# Patient Record
Sex: Female | Born: 1955 | Race: White | Hispanic: No | Smoking: Former smoker
Health system: Southern US, Community
[De-identification: ages and names within clinical notes are randomized; demographics above are authoritative.]

## PROBLEM LIST (undated history)

## (undated) DIAGNOSIS — F1010 Alcohol abuse, uncomplicated: Secondary | ICD-10-CM

## (undated) DIAGNOSIS — I10 Essential (primary) hypertension: Secondary | ICD-10-CM

## (undated) DIAGNOSIS — J189 Pneumonia, unspecified organism: Secondary | ICD-10-CM

## (undated) DIAGNOSIS — F32A Depression, unspecified: Secondary | ICD-10-CM

## (undated) DIAGNOSIS — Z720 Tobacco use: Secondary | ICD-10-CM

## (undated) DIAGNOSIS — F419 Anxiety disorder, unspecified: Secondary | ICD-10-CM

## (undated) DIAGNOSIS — K219 Gastro-esophageal reflux disease without esophagitis: Secondary | ICD-10-CM

## (undated) DIAGNOSIS — I5022 Chronic systolic (congestive) heart failure: Secondary | ICD-10-CM

## (undated) DIAGNOSIS — J449 Chronic obstructive pulmonary disease, unspecified: Secondary | ICD-10-CM

## (undated) DIAGNOSIS — R51 Headache: Secondary | ICD-10-CM

## (undated) DIAGNOSIS — M1990 Unspecified osteoarthritis, unspecified site: Secondary | ICD-10-CM

## (undated) DIAGNOSIS — F329 Major depressive disorder, single episode, unspecified: Secondary | ICD-10-CM

## (undated) DIAGNOSIS — C260 Malignant neoplasm of intestinal tract, part unspecified: Secondary | ICD-10-CM

## (undated) DIAGNOSIS — R519 Headache, unspecified: Secondary | ICD-10-CM

## (undated) HISTORY — DX: Essential (primary) hypertension: I10

## (undated) HISTORY — PX: TUBAL LIGATION: SHX77

## (undated) HISTORY — PX: CHOLECYSTECTOMY: SHX55

## (undated) HISTORY — PX: JOINT REPLACEMENT: SHX530

## (undated) HISTORY — PX: KNEE ARTHROSCOPY: SUR90

## (undated) HISTORY — PX: UPPER GI ENDOSCOPY: SHX6162

## (undated) HISTORY — PX: BACK SURGERY: SHX140

## (undated) HISTORY — PX: DILATION AND CURETTAGE OF UTERUS: SHX78

## (undated) HISTORY — PX: OTHER SURGICAL HISTORY: SHX169

## (undated) HISTORY — PX: LAPAROSCOPY: SHX197

## (undated) HISTORY — PX: ELBOW SURGERY: SHX618

## (undated) HISTORY — PX: CARPAL TUNNEL RELEASE: SHX101

## (undated) HISTORY — PX: ULNAR NERVE TRANSPOSITION: SHX2595

---

## 1997-09-24 ENCOUNTER — Emergency Department (HOSPITAL_COMMUNITY): Admission: EM | Admit: 1997-09-24 | Discharge: 1997-09-24 | Payer: Self-pay | Admitting: Emergency Medicine

## 2003-01-16 ENCOUNTER — Encounter: Admission: RE | Admit: 2003-01-16 | Discharge: 2003-01-16 | Payer: Self-pay | Admitting: Family Medicine

## 2003-01-16 ENCOUNTER — Encounter: Payer: Self-pay | Admitting: Family Medicine

## 2003-10-10 ENCOUNTER — Encounter: Admission: RE | Admit: 2003-10-10 | Discharge: 2003-10-10 | Payer: Self-pay | Admitting: Orthopaedic Surgery

## 2005-08-26 ENCOUNTER — Ambulatory Visit: Payer: Self-pay | Admitting: Family Medicine

## 2005-09-25 ENCOUNTER — Ambulatory Visit: Payer: Self-pay | Admitting: Family Medicine

## 2005-09-29 ENCOUNTER — Ambulatory Visit: Payer: Self-pay | Admitting: Gastroenterology

## 2005-10-02 ENCOUNTER — Ambulatory Visit: Payer: Self-pay | Admitting: Gastroenterology

## 2008-12-03 ENCOUNTER — Emergency Department (HOSPITAL_COMMUNITY): Admission: EM | Admit: 2008-12-03 | Discharge: 2008-12-03 | Payer: Self-pay | Admitting: Emergency Medicine

## 2009-05-14 ENCOUNTER — Emergency Department (HOSPITAL_COMMUNITY): Admission: EM | Admit: 2009-05-14 | Discharge: 2009-05-14 | Payer: Self-pay | Admitting: Emergency Medicine

## 2009-05-31 ENCOUNTER — Emergency Department (HOSPITAL_COMMUNITY): Admission: EM | Admit: 2009-05-31 | Discharge: 2009-06-01 | Payer: Self-pay | Admitting: Emergency Medicine

## 2009-07-30 ENCOUNTER — Ambulatory Visit: Payer: Self-pay | Admitting: Internal Medicine

## 2009-08-29 ENCOUNTER — Ambulatory Visit: Payer: Self-pay | Admitting: Internal Medicine

## 2009-08-29 ENCOUNTER — Encounter (INDEPENDENT_AMBULATORY_CARE_PROVIDER_SITE_OTHER): Payer: Self-pay | Admitting: Family Medicine

## 2009-08-29 LAB — CONVERTED CEMR LAB
AST: 19 units/L (ref 0–37)
Albumin: 4 g/dL (ref 3.5–5.2)
Alkaline Phosphatase: 117 units/L (ref 39–117)
Basophils Absolute: 0.1 10*3/uL (ref 0.0–0.1)
CRP: 3 mg/dL — ABNORMAL HIGH (ref <0.6)
Calcium: 9.2 mg/dL (ref 8.4–10.5)
Eosinophils Absolute: 0.5 10*3/uL (ref 0.0–0.7)
Eosinophils Relative: 4 % (ref 0–5)
Glucose, Bld: 78 mg/dL (ref 70–99)
Lymphocytes Relative: 32 % (ref 12–46)
Lymphs Abs: 3.6 10*3/uL (ref 0.7–4.0)
MCV: 91.6 fL (ref 78.0–100.0)
Monocytes Absolute: 0.8 10*3/uL (ref 0.1–1.0)
Monocytes Relative: 7 % (ref 3–12)
Neutrophils Relative %: 55 % (ref 43–77)
Rhuematoid fact SerPl-aCnc: <20 intl units/mL (ref 0–20)
Sodium: 142 meq/L (ref 135–145)
TSH: 1.404 microintl units/mL (ref 0.350–4.500)
Total Bilirubin: 0.4 mg/dL (ref 0.3–1.2)
Total Protein: 7.3 g/dL (ref 6.0–8.3)

## 2009-09-21 ENCOUNTER — Encounter
Admission: RE | Admit: 2009-09-21 | Discharge: 2009-12-20 | Payer: Self-pay | Admitting: Physical Medicine & Rehabilitation

## 2009-09-27 ENCOUNTER — Ambulatory Visit: Payer: Self-pay | Admitting: Physical Medicine & Rehabilitation

## 2009-10-01 ENCOUNTER — Ambulatory Visit: Payer: Self-pay | Admitting: Internal Medicine

## 2009-10-03 ENCOUNTER — Encounter
Admission: RE | Admit: 2009-10-03 | Discharge: 2009-11-02 | Payer: Self-pay | Admitting: Physical Medicine & Rehabilitation

## 2009-10-10 ENCOUNTER — Ambulatory Visit: Payer: Self-pay | Admitting: Internal Medicine

## 2009-10-18 ENCOUNTER — Ambulatory Visit: Payer: Self-pay | Admitting: Internal Medicine

## 2009-10-25 ENCOUNTER — Encounter
Admission: RE | Admit: 2009-10-25 | Discharge: 2009-10-25 | Payer: Self-pay | Admitting: Physical Medicine & Rehabilitation

## 2009-11-02 ENCOUNTER — Ambulatory Visit: Payer: Self-pay | Admitting: Physical Medicine & Rehabilitation

## 2009-11-26 ENCOUNTER — Ambulatory Visit: Payer: Self-pay | Admitting: Internal Medicine

## 2010-06-28 ENCOUNTER — Emergency Department (HOSPITAL_COMMUNITY): Payer: Managed Care, Other (non HMO)

## 2010-06-28 ENCOUNTER — Inpatient Hospital Stay (HOSPITAL_COMMUNITY)
Admission: EM | Admit: 2010-06-28 | Discharge: 2010-07-02 | DRG: 192 | Disposition: A | Discharge status: Home / Self Care | Payer: Managed Care, Other (non HMO) | Attending: Internal Medicine | Admitting: Internal Medicine

## 2010-06-28 DIAGNOSIS — J209 Acute bronchitis, unspecified: Principal | ICD-10-CM | POA: Diagnosis present

## 2010-06-28 DIAGNOSIS — J440 Chronic obstructive pulmonary disease with acute lower respiratory infection: Principal | ICD-10-CM | POA: Diagnosis present

## 2010-06-28 DIAGNOSIS — I454 Nonspecific intraventricular block: Secondary | ICD-10-CM | POA: Diagnosis present

## 2010-06-28 DIAGNOSIS — F1010 Alcohol abuse, uncomplicated: Secondary | ICD-10-CM | POA: Diagnosis present

## 2010-06-28 DIAGNOSIS — I447 Left bundle-branch block, unspecified: Secondary | ICD-10-CM

## 2010-06-28 DIAGNOSIS — F17200 Nicotine dependence, unspecified, uncomplicated: Secondary | ICD-10-CM | POA: Diagnosis present

## 2010-06-28 DIAGNOSIS — R000 Tachycardia, unspecified: Secondary | ICD-10-CM | POA: Diagnosis present

## 2010-06-28 DIAGNOSIS — F411 Generalized anxiety disorder: Secondary | ICD-10-CM | POA: Diagnosis present

## 2010-06-28 HISTORY — DX: Left bundle-branch block, unspecified: I44.7

## 2010-06-28 LAB — CBC
MCV: 93.7 fL (ref 78.0–100.0)
Platelets: 332 10*3/uL (ref 150–400)
RDW: 14.1 % (ref 11.5–15.5)
WBC: 11.8 10*3/uL — ABNORMAL HIGH (ref 4.0–10.5)

## 2010-06-28 LAB — BASIC METABOLIC PANEL
CO2: 24 mEq/L (ref 19–32)
GFR calc non Af Amer: >60 mL/min (ref >60)
Glucose, Bld: 134 mg/dL — ABNORMAL HIGH (ref 70–99)
Sodium: 142 mEq/L (ref 135–145)

## 2010-06-28 LAB — BLOOD GAS, ARTERIAL
Bicarbonate: 23 mEq/L (ref 20.0–24.0)
pH, Arterial: 7.322 — ABNORMAL LOW (ref 7.350–7.400)

## 2010-06-28 LAB — DIFFERENTIAL
Basophils Absolute: 0.1 10*3/uL (ref 0.0–0.1)
Lymphs Abs: 1.8 10*3/uL (ref 0.7–4.0)
Monocytes Absolute: 0.5 10*3/uL (ref 0.1–1.0)
Neutro Abs: 8.8 10*3/uL — ABNORMAL HIGH (ref 1.7–7.7)
Neutrophils Relative %: 75 % (ref 43–77)

## 2010-06-28 LAB — POCT CARDIAC MARKERS: Troponin i, poc: <0.05 ng/mL (ref 0.00–0.09)

## 2010-06-29 LAB — COMPREHENSIVE METABOLIC PANEL
Albumin: 3.5 g/dL (ref 3.5–5.2)
Alkaline Phosphatase: 114 U/L (ref 39–117)
BUN: 7 mg/dL (ref 6–23)
CO2: 26 mEq/L (ref 19–32)
Calcium: 9.4 mg/dL (ref 8.4–10.5)
GFR calc Af Amer: >60 mL/min (ref >60)
Total Bilirubin: 0.3 mg/dL (ref 0.3–1.2)
Total Protein: 7.8 g/dL (ref 6.0–8.3)

## 2010-06-29 LAB — RAPID URINE DRUG SCREEN, HOSP PERFORMED
Cocaine: NOT DETECTED
Opiates: POSITIVE — AB
Tetrahydrocannabinol: NOT DETECTED

## 2010-06-29 LAB — CARDIAC PANEL(CRET KIN+CKTOT+MB+TROPI)
CK, MB: 6.4 ng/mL (ref 0.3–4.0)
CK, MB: 8 ng/mL (ref 0.3–4.0)
CK, MB: 9.9 ng/mL (ref 0.3–4.0)
Relative Index: 2.5 (ref 0.0–2.5)
Relative Index: 2.6 — ABNORMAL HIGH (ref 0.0–2.5)
Relative Index: 2.6 — ABNORMAL HIGH (ref 0.0–2.5)
Total CK: 310 U/L — ABNORMAL HIGH (ref 7–177)

## 2010-06-29 LAB — CBC
HCT: 41.1 % (ref 36.0–46.0)
MCHC: 32.6 g/dL (ref 30.0–36.0)
MCV: 93.8 fL (ref 78.0–100.0)
WBC: 10.1 10*3/uL (ref 4.0–10.5)

## 2010-06-29 LAB — PHOSPHORUS: Phosphorus: 2.4 mg/dL (ref 2.3–4.6)

## 2010-06-29 LAB — MAGNESIUM: Magnesium: 2.4 mg/dL (ref 1.5–2.5)

## 2010-06-29 LAB — TSH: TSH: 0.388 u[IU]/mL (ref 0.350–4.500)

## 2010-07-01 LAB — CBC
HCT: 37.8 % (ref 36.0–46.0)
MCH: 30 pg (ref 26.0–34.0)
Platelets: 255 10*3/uL (ref 150–400)
RBC: 3.9 MIL/uL (ref 3.87–5.11)

## 2010-07-01 LAB — BASIC METABOLIC PANEL
CO2: 27 mEq/L (ref 19–32)
Chloride: 111 mEq/L (ref 96–112)
Creatinine, Ser: 0.49 mg/dL (ref 0.4–1.2)
GFR calc Af Amer: >60 mL/min (ref >60)
Potassium: 4.2 mEq/L (ref 3.5–5.1)
Sodium: 142 mEq/L (ref 135–145)

## 2010-07-01 LAB — MAGNESIUM: Magnesium: 2.2 mg/dL (ref 1.5–2.5)

## 2010-07-02 LAB — BASIC METABOLIC PANEL
BUN: 9 mg/dL (ref 6–23)
Calcium: 8.6 mg/dL (ref 8.4–10.5)
GFR calc non Af Amer: >60 mL/min (ref >60)
Potassium: 4.4 mEq/L (ref 3.5–5.1)

## 2010-07-02 LAB — CBC
MCHC: 31 g/dL (ref 30.0–36.0)
MCV: 97.2 fL (ref 78.0–100.0)
Platelets: 238 10*3/uL (ref 150–400)
RDW: 14.5 % (ref 11.5–15.5)
WBC: 15.1 10*3/uL — ABNORMAL HIGH (ref 4.0–10.5)

## 2010-07-03 NOTE — Discharge Summary (Signed)
NAME:  Katrina [name], MALLY NO.:  [id number]  MEDICAL RECORD NO.:  [id number]           PATIENT TYPE:  I  LOCATION:  1407                         FACILITY:  [facility]  PHYSICIAN:  Isidor Holts, M.D.  DATE OF BIRTH:  06-Mar-1956  DATE OF ADMISSION:  06/28/2010 DATE OF DISCHARGE:                              DISCHARGE SUMMARY   DATE OF ADMISSION:  June 28, 2010  DATE OF DISCHARGE:  July 02, 2010  PRIMARY MD:  Noberto Retort, M.D.  DISCHARGE DIAGNOSES: 1. Acute bronchitis. 2. Infective exacerbation of chronic obstructive pulmonary disease,     secondary to acute bronchitis. 3. Smoking history. 4. Alcohol abuse. 5. Anxiety/depression. 6. Chronic left bundle-branch block. 7. Morbid obesity.  DISCHARGE MEDICATIONS: 1. Symbicort (80/4.5 mcg) 1 puff b.i.d. 2. Mucinex 600 mg p.o. b.i.d. for 7 days. 3. Avelox 400 mg p.o. daily at bedtime for 7 days. 4. NicoDerm CQ (21 mg/24-hour patch) one patch to skin daily.5. MiraLax 17 g p.o. daily for constipation. 6. Prednisone 40 mg p.o. daily for 3 days, then 30 mg p.o. day for 3     days, then 20 mg p.o. daily for 3 days, then 10 mg p.o. daily for 3     days, then stop. 7. Thiamine 100 mg p.o. daily. 8. Xopenex HFA inhaler.  2 puffs p.r.n. q.4 hourly for shortness of     breath. 9. Women's 50 Plus multivitamins one p.o. daily.  PROCEDURES:  Chest x-ray June 28, 2010.  This showed cardiac mediastinal silhouette unremarkable.  Central mild bronchitic changes. No acute infiltrate or edema.  Bony thorax is unremarkable.  CONSULTATIONS:  None.  ADMISSION HISTORY:  As in H and P notes of June 28, 2010 dictated by Dr. Lonia Blood.  However, in brief, this is a 55 year old female smoker, with history of alcohol abuse, anxiety, degenerative disk disease, depression, chronic left bundle branch block, presenting to her primary MD's office with progressive shortness of breath associated with a dry cough of approximately one  week's duration.  She was found to be tachycardic and wheezy and after a number of treatments at the primary MD's office, because of failure of amelioration of symptoms, she was sent over to the ED and was subsequently admitted for further evaluation, investigation and management.  CLINICAL COURSE: 1. Acute bronchitis.  The patient presents as described above.  Chest     x-ray demonstrated bronchitic changes with no evidence of pneumonic     consolidation.  She was managed with Avelox, Mucinex,     bronchodilator nebulizers.  Clinical response was satisfactory.  By     July 02, 2010, cough had considerably ameliorated and the patient     felt considerably better.  2. COPD exacerbation.  This was secondary to acute bronchitis,     responded to above-mentioned management measures as well as     parenteral steroids and nebulizers.  However, by June 30, 2010,     clinical condition improved so much so, that we were able to     transition the patient to oral steroid taper.  By July 02, 2010,  she had no audible wheezes, felt considerably better, and was     considered stable for discharge.  3. Smoking history.  The patient was managed with NicoDerm CQ patch     and counseled appropriately.  She has requested a prescription for     NicoDerm CQ patch and appears willing to try to quit.  She has been     encouraged accordingly.  4. Alcohol abuse.  The patient was counseled with regards to alcohol     excess and was managed with p.r.n. Ativan during the course of this     hospitalization, as well as vitamin supplements.  She showed no     clinical evidence of alcohol withdrawal during this     hospitalization.  5. History of anxiety/depression.  Mood remained stable during this     hospitalization.  DISPOSITION:  The patient was on July 02, 2010, considered clinically stable for discharge with no new issues.  She was therefore discharged accordingly.  ACTIVITY:  As  tolerated.  DIET:  No restrictions.  FOLLOWUP INSTRUCTIONS:  The patient is to follow up routinely with her primary MD per prior scheduled appointment.     Isidor Holts, M.D.     CO/MEDQ  D:  07/02/2010  T:  07/03/2010  Job:  [id number]  cc:   Melida Quitter, M.D. Fax: [phone]  Electronically Signed by Isidor Holts M.D. on 07/03/2010 02:11:30 PM

## 2010-07-23 NOTE — H&P (Signed)
NAME:  Katrina [name], NORDLUND NO.:  [id number]  MEDICAL RECORD NO.:  [id number]           PATIENT TYPE:  I  LOCATION:  1407                         [facility]ACILITY:  [facility]  PHYSICIAN:  Lonia Blood, M.D.      DATE OF BIRTH:  09-24-1955  DATE OF ADMISSION:  06/28/2010 DATE OF DISCHARGE:                             HISTORY & PHYSICAL   PRIMARY CARE PHYSICIAN:  Noberto Retort, M.D.  PRESENTING COMPLAINT:  Shortness of breath.  HISTORY OF PRESENT ILLNESS:  The patient is a 55 year old female who is a heavy smoker and also drinks presenting with progressive shortness of breath for the last 1 week.  Associated is dry cough.  She went to see her primary care physician today, was tachycardic.  She was having some mild chest pain.  She was wheezing also.  She apparently had 4 treatments at home and 1 at the PCP's office, but continued to have wheezing and shortness of breath.  She was sent over for further workup in the ED.  On arrival, she had a heart rate of 131 with sinus tachycardia with left bundle-branch block.  PAST MEDICAL HISTORY:  Significant for: 1. Anxiety. 2. Degenerative disk disease. 3. Depression. 4. Prior left bundle-branch block on EKG. 5. Heavy alcohol abuse. 6. Tobacco abuse.  ALLERGIES:  CODEINE.  MEDICATIONS:  Albuterol nebs, tramadol and acetaminophen, and Cymbalta.  SOCIAL HISTORY:  She lives in [place].  She smokes about 3 packs of tobacco per day and drinks about 3- to 6-pack beers every night.  Denied IV drug use.  FAMILY HISTORY:  Denied any family history of substance abuse, mainly hypertension.  REVIEW OF SYSTEMS:  Generalized aches otherwise rest of the review of systems is per HPI.  PHYSICAL EXAMINATION:  VITAL SIGNS:  Temperature is 98.3, blood pressure 169/117, pulse 137, respiratory rate 32, sats 98% room air. GENERAL:  The patient looks anxious, worried, slightly tachypneic but in mild respiratory distress. HEENT:  PERRL.   EOMI.  No pallor.  No jaundice.  No rhinorrhea. NECK:  Supple.  No JVD.  No lymphadenopathy. RESPIRATORY:  She has decreased air entry bilaterally, not moving air adequately, with some mild external wheezing.  No crackles.  No rales. CARDIOVASCULAR SYSTEM:  She is tachycardic. ABDOMEN:  Obese, soft, and nontender with positive bowel sounds. EXTREMITIES:  No edema, cyanosis, or clubbing. SKIN:  No rashes.  No ulcers. MUSCULOSKELETAL:  No joint swelling, tenderness. PSYCH:  She seems anxious, worried, otherwise no suicidal ideation.  LABORATORY DATA:  Sodium 142, potassium 3.5, chloride 106, CO2 24, glucose 134, BUN 7, creatinine 0.71, and calcium 9.5.  White count is 11.8, hemoglobin 14.0, platelet count 332.  She has left shift ANC of 8.8.  Her chest x-ray showed no acute infiltrate or edema.  There is mild bronchitic changes.  ABG on 100% oxygen non-rebreather mask showed pH of 7.322, pCO2 of 45.7, and pO2 of 222.  ASSESSMENT:  This is a 55 year old female presenting with what appears to be chronic obstructive pulmonary disease versus asthma exacerbation.  PLAN: 1. Chronic obstructive pulmonary disease.  Admit the patient, keep on  oxygen titrated to nasal cannula.  IV Solu-Medrol and nebulizers in     the hospital.  If she remains tachycardic, I will use Xopenex     rather than albuterol.  I will also give her IV antibiotics with a     heavy tobacco smoking and if she had asthma.  This is more than     likely chronic obstructive pulmonary disease, superimposed. 2. Tobacco abuse.  I will put the patient on nicotine patch and also     tobacco cessation counseling. 3. Alcohol abuse with active CIWA protocol. 4. Morbid obesity.  The patient has been counseled. 5. Depression.  I will continue with her Cymbalta once I know the     exact dose. 6. Anxiety disorder.  Again, we will get her home medication list and     continue with that.     Lonia Blood, M.D.     Verlin Grills  D:   06/29/2010  T:  06/29/2010  Job:  [id number]  Electronically Signed by Lonia Blood M.D. on 07/23/2010 03:47:27 PM

## 2010-10-31 ENCOUNTER — Inpatient Hospital Stay (INDEPENDENT_AMBULATORY_CARE_PROVIDER_SITE_OTHER)
Admission: RE | Admit: 2010-10-31 | Discharge: 2010-10-31 | Disposition: A | Discharge status: Home / Self Care | Payer: Managed Care, Other (non HMO) | Source: Ambulatory Visit | Attending: Family Medicine | Admitting: Family Medicine

## 2010-10-31 ENCOUNTER — Ambulatory Visit (INDEPENDENT_AMBULATORY_CARE_PROVIDER_SITE_OTHER): Payer: Managed Care, Other (non HMO)

## 2010-10-31 DIAGNOSIS — M25469 Effusion, unspecified knee: Secondary | ICD-10-CM

## 2010-10-31 DIAGNOSIS — M79609 Pain in unspecified limb: Secondary | ICD-10-CM

## 2010-12-11 ENCOUNTER — Other Ambulatory Visit: Payer: Self-pay | Admitting: Family Medicine

## 2010-12-11 DIAGNOSIS — Z1231 Encounter for screening mammogram for malignant neoplasm of breast: Secondary | ICD-10-CM

## 2010-12-20 ENCOUNTER — Ambulatory Visit
Admission: RE | Admit: 2010-12-20 | Discharge: 2010-12-20 | Disposition: A | Discharge status: Home / Self Care | Payer: Managed Care, Other (non HMO) | Source: Ambulatory Visit | Attending: Family Medicine | Admitting: Family Medicine

## 2010-12-20 DIAGNOSIS — Z1231 Encounter for screening mammogram for malignant neoplasm of breast: Secondary | ICD-10-CM

## 2011-04-03 ENCOUNTER — Other Ambulatory Visit (HOSPITAL_COMMUNITY): Payer: Self-pay | Admitting: Orthopaedic Surgery

## 2011-04-18 ENCOUNTER — Encounter (HOSPITAL_COMMUNITY): Payer: Self-pay

## 2011-04-29 ENCOUNTER — Other Ambulatory Visit: Payer: Self-pay

## 2011-04-29 ENCOUNTER — Encounter (HOSPITAL_COMMUNITY)
Admission: RE | Admit: 2011-04-29 | Discharge: 2011-04-29 | Disposition: A | Discharge status: Home / Self Care | Payer: BC Managed Care – PPO | Source: Ambulatory Visit | Attending: Orthopaedic Surgery | Admitting: Orthopaedic Surgery

## 2011-04-29 ENCOUNTER — Ambulatory Visit (HOSPITAL_COMMUNITY)
Admission: RE | Admit: 2011-04-29 | Discharge: 2011-04-29 | Disposition: A | Discharge status: Home / Self Care | Payer: BC Managed Care – PPO | Source: Ambulatory Visit | Attending: Orthopaedic Surgery | Admitting: Orthopaedic Surgery

## 2011-04-29 ENCOUNTER — Encounter (HOSPITAL_COMMUNITY): Payer: Self-pay

## 2011-04-29 DIAGNOSIS — Z01818 Encounter for other preprocedural examination: Secondary | ICD-10-CM | POA: Insufficient documentation

## 2011-04-29 DIAGNOSIS — M169 Osteoarthritis of hip, unspecified: Secondary | ICD-10-CM | POA: Insufficient documentation

## 2011-04-29 DIAGNOSIS — M1610 Unilateral primary osteoarthritis, unspecified hip: Secondary | ICD-10-CM | POA: Insufficient documentation

## 2011-04-29 DIAGNOSIS — F17200 Nicotine dependence, unspecified, uncomplicated: Secondary | ICD-10-CM | POA: Insufficient documentation

## 2011-04-29 DIAGNOSIS — Z01810 Encounter for preprocedural cardiovascular examination: Secondary | ICD-10-CM | POA: Insufficient documentation

## 2011-04-29 DIAGNOSIS — Z01812 Encounter for preprocedural laboratory examination: Secondary | ICD-10-CM | POA: Insufficient documentation

## 2011-04-29 HISTORY — DX: Depression, unspecified: F32.A

## 2011-04-29 HISTORY — DX: Major depressive disorder, single episode, unspecified: F32.9

## 2011-04-29 HISTORY — DX: Unspecified osteoarthritis, unspecified site: M19.90

## 2011-04-29 LAB — URINALYSIS, ROUTINE W REFLEX MICROSCOPIC
Hgb urine dipstick: NEGATIVE
Leukocytes, UA: NEGATIVE
Nitrite: NEGATIVE
Protein, ur: NEGATIVE mg/dL
Specific Gravity, Urine: 1.018 (ref 1.005–1.030)
Urobilinogen, UA: 0.2 mg/dL (ref 0.0–1.0)

## 2011-04-29 LAB — CBC
HCT: 43.7 % (ref 36.0–46.0)
Platelets: 310 10*3/uL (ref 150–400)
RDW: 15.3 % (ref 11.5–15.5)
WBC: 13.5 10*3/uL — ABNORMAL HIGH (ref 4.0–10.5)

## 2011-04-29 LAB — COMPREHENSIVE METABOLIC PANEL
BUN: 10 mg/dL (ref 6–23)
CO2: 27 mEq/L (ref 19–32)
Calcium: 10 mg/dL (ref 8.4–10.5)
Chloride: 102 mEq/L (ref 96–112)
Creatinine, Ser: 0.74 mg/dL (ref 0.50–1.10)
GFR calc Af Amer: >90 mL/min (ref >90)
GFR calc non Af Amer: >90 mL/min (ref >90)
Glucose, Bld: 97 mg/dL (ref 70–99)
Total Bilirubin: 0.4 mg/dL (ref 0.3–1.2)

## 2011-04-29 LAB — APTT: aPTT: 35 seconds (ref 24–37)

## 2011-04-29 LAB — SURGICAL PCR SCREEN
MRSA, PCR: NEGATIVE
Staphylococcus aureus: NEGATIVE

## 2011-04-29 LAB — PROTIME-INR: INR: 0.94 (ref 0.00–1.49)

## 2011-04-29 NOTE — Patient Instructions (Addendum)
20 Katrina [name]  04/29/2011   Your procedure is scheduled on:  05/02/2011 16109 am-1045 am   Report to [facility] at 0715         AM.  Call this number if you have problems the morning of surgery: [phone]   Remember:   Do not eat food:After Midnight.  May have clear liquids:until Midnight .  Clear liquids include soda, tea, black coffee, apple or grape juice, broth.  Take these medicines the morning of surgery with A SIP OF WATER:    Do not wear jewelry, make-up or nail polish.  Do not wear lotions, powders, or perfumes.   Do not shave 48 hours prior to surgery.  Do not bring valuables to the hospital.  Contacts, dentures or bridgework may not be worn into surgery.  Leave suitcase in the car. After surgery it may be brought to your room.  For patients admitted to the hospital, checkout time is 11:00 AM the day of discharge.      Special Instructions: CHG Shower Use Special Wash: 1/2 bottle night before surgery and 1/2 bottle morning of surgery. Shower chin to toes with CHG.  Wash face and private parts with regular soap.     Please read over the following fact sheets that you were given: MRSA Information, coughing and deep breathing exercises , leg exercises

## 2011-05-02 ENCOUNTER — Encounter (HOSPITAL_COMMUNITY): Payer: Self-pay

## 2011-05-02 ENCOUNTER — Inpatient Hospital Stay (HOSPITAL_COMMUNITY): Payer: Medicare Other

## 2011-05-02 ENCOUNTER — Encounter (HOSPITAL_COMMUNITY)
Admission: RE | Disposition: A | Discharge status: Home Health | Payer: Self-pay | Source: Ambulatory Visit | Attending: Orthopaedic Surgery

## 2011-05-02 ENCOUNTER — Encounter (HOSPITAL_COMMUNITY): Payer: Self-pay | Admitting: Anesthesiology

## 2011-05-02 ENCOUNTER — Inpatient Hospital Stay (HOSPITAL_COMMUNITY): Payer: Medicare Other | Admitting: Anesthesiology

## 2011-05-02 ENCOUNTER — Inpatient Hospital Stay (HOSPITAL_COMMUNITY)
Admission: RE | Admit: 2011-05-02 | Discharge: 2011-05-05 | DRG: 470 | Disposition: A | Discharge status: Home Health | Payer: Medicare Other | Source: Ambulatory Visit | Attending: Orthopaedic Surgery | Admitting: Orthopaedic Surgery

## 2011-05-02 DIAGNOSIS — F17200 Nicotine dependence, unspecified, uncomplicated: Secondary | ICD-10-CM | POA: Diagnosis present

## 2011-05-02 DIAGNOSIS — J45909 Unspecified asthma, uncomplicated: Secondary | ICD-10-CM | POA: Diagnosis present

## 2011-05-02 DIAGNOSIS — Z96659 Presence of unspecified artificial knee joint: Secondary | ICD-10-CM

## 2011-05-02 DIAGNOSIS — M169 Osteoarthritis of hip, unspecified: Principal | ICD-10-CM

## 2011-05-02 DIAGNOSIS — M1610 Unilateral primary osteoarthritis, unspecified hip: Principal | ICD-10-CM | POA: Diagnosis present

## 2011-05-02 HISTORY — PX: TOTAL HIP ARTHROPLASTY: SHX124

## 2011-05-02 SURGERY — ARTHROPLASTY, HIP, TOTAL, ANTERIOR APPROACH
Anesthesia: General | Site: Hip | Laterality: Left | Wound class: Clean

## 2011-05-02 MED ORDER — FENTANYL CITRATE 0.05 MG/ML IJ SOLN
INTRAMUSCULAR | Status: AC
  Filled 2011-05-02: qty 2

## 2011-05-02 MED ORDER — OXYCODONE HCL 5 MG PO TABS: 5.0000 mg | ORAL_TABLET | ORAL | Status: DC | PRN

## 2011-05-02 MED ORDER — ZOLPIDEM TARTRATE 5 MG PO TABS: 5.0000 mg | ORAL_TABLET | Freq: Every evening | ORAL | Status: DC | PRN

## 2011-05-02 MED ORDER — CISATRACURIUM BESYLATE 2 MG/ML IV SOLN
INTRAVENOUS | Status: DC | PRN
  Administered 2011-05-02: 10 mg via INTRAVENOUS

## 2011-05-02 MED ORDER — PHENOL 1.4 % MT LIQD
1.0000 | OROMUCOSAL | Status: DC | PRN
  Filled 2011-05-02: qty 177

## 2011-05-02 MED ORDER — ONDANSETRON HCL 4 MG/2ML IJ SOLN
INTRAMUSCULAR | Status: DC | PRN
  Administered 2011-05-02 (×2): 2 mg via INTRAVENOUS

## 2011-05-02 MED ORDER — FERROUS SULFATE 325 (65 FE) MG PO TABS
325.0000 mg | ORAL_TABLET | Freq: Three times a day (TID) | ORAL | Status: DC
  Administered 2011-05-03 – 2011-05-05 (×7): 325 mg via ORAL
  Filled 2011-05-02 (×10): qty 1

## 2011-05-02 MED ORDER — SODIUM CHLORIDE 0.9 % IJ SOLN: 9.0000 mL | INTRAMUSCULAR | Status: DC | PRN

## 2011-05-02 MED ORDER — METOCLOPRAMIDE HCL 5 MG/ML IJ SOLN: 5.0000 mg | Freq: Three times a day (TID) | INTRAMUSCULAR | Status: DC | PRN

## 2011-05-02 MED ORDER — MENTHOL 3 MG MT LOZG
1.0000 | LOZENGE | OROMUCOSAL | Status: DC | PRN
  Filled 2011-05-02: qty 9

## 2011-05-02 MED ORDER — LIDOCAINE HCL (CARDIAC) 20 MG/ML IV SOLN
INTRAVENOUS | Status: DC | PRN
  Administered 2011-05-02: 20 mg via INTRAVENOUS

## 2011-05-02 MED ORDER — ADULT MULTIVITAMIN W/MINERALS CH
1.0000 | ORAL_TABLET | Freq: Every day | ORAL | Status: DC
  Administered 2011-05-03 – 2011-05-05 (×3): 1 via ORAL
  Filled 2011-05-02 (×3): qty 1

## 2011-05-02 MED ORDER — DIPHENHYDRAMINE HCL 12.5 MG/5ML PO ELIX
12.5000 mg | ORAL_SOLUTION | Freq: Four times a day (QID) | ORAL | Status: DC | PRN
  Filled 2011-05-02: qty 5

## 2011-05-02 MED ORDER — ALUM & MAG HYDROXIDE-SIMETH 200-200-20 MG/5ML PO SUSP: 30.0000 mL | ORAL | Status: DC | PRN

## 2011-05-02 MED ORDER — DROPERIDOL 2.5 MG/ML IJ SOLN
INTRAMUSCULAR | Status: DC | PRN
  Administered 2011-05-02: .5 mg via INTRAVENOUS

## 2011-05-02 MED ORDER — ACETAMINOPHEN 650 MG RE SUPP: 650.0000 mg | Freq: Four times a day (QID) | RECTAL | Status: DC | PRN

## 2011-05-02 MED ORDER — ONDANSETRON HCL 4 MG/2ML IJ SOLN
4.0000 mg | Freq: Four times a day (QID) | INTRAMUSCULAR | Status: DC | PRN
  Administered 2011-05-03 – 2011-05-04 (×3): 4 mg via INTRAVENOUS
  Filled 2011-05-02 (×2): qty 2

## 2011-05-02 MED ORDER — METOCLOPRAMIDE HCL 10 MG PO TABS: 5.0000 mg | ORAL_TABLET | Freq: Three times a day (TID) | ORAL | Status: DC | PRN

## 2011-05-02 MED ORDER — SUCCINYLCHOLINE CHLORIDE 20 MG/ML IJ SOLN
INTRAMUSCULAR | Status: DC | PRN
  Administered 2011-05-02: 140 mg via INTRAVENOUS

## 2011-05-02 MED ORDER — KETAMINE HCL 10 MG/ML IJ SOLN
INTRAMUSCULAR | Status: DC | PRN
  Administered 2011-05-02 (×4): 5 mg via INTRAVENOUS

## 2011-05-02 MED ORDER — METHOCARBAMOL 100 MG/ML IJ SOLN
500.0000 mg | Freq: Four times a day (QID) | INTRAVENOUS | Status: DC | PRN
  Administered 2011-05-02: 500 mg via INTRAVENOUS
  Filled 2011-05-02 (×2): qty 5

## 2011-05-02 MED ORDER — ACETAMINOPHEN 10 MG/ML IV SOLN
INTRAVENOUS | Status: DC | PRN
  Administered 2011-05-02: 1000 mg via INTRAVENOUS

## 2011-05-02 MED ORDER — ONDANSETRON HCL 4 MG/2ML IJ SOLN
4.0000 mg | Freq: Four times a day (QID) | INTRAMUSCULAR | Status: DC | PRN
  Filled 2011-05-02 (×2): qty 2

## 2011-05-02 MED ORDER — HYDROMORPHONE HCL PF 1 MG/ML IJ SOLN
INTRAMUSCULAR | Status: AC
  Filled 2011-05-02: qty 1

## 2011-05-02 MED ORDER — LACTATED RINGERS IV SOLN
INTRAVENOUS | Status: DC
  Administered 2011-05-02: 1000 mL via INTRAVENOUS
  Administered 2011-05-02: 12:00:00 via INTRAVENOUS

## 2011-05-02 MED ORDER — ONDANSETRON HCL 4 MG PO TABS: 4.0000 mg | ORAL_TABLET | Freq: Four times a day (QID) | ORAL | Status: DC | PRN

## 2011-05-02 MED ORDER — FENTANYL CITRATE 0.05 MG/ML IJ SOLN
50.0000 ug | INTRAMUSCULAR | Status: DC | PRN
  Administered 2011-05-02: 100 ug via INTRAVENOUS

## 2011-05-02 MED ORDER — HYDROCODONE-ACETAMINOPHEN 5-325 MG PO TABS: 1.0000 | ORAL_TABLET | ORAL | Status: DC | PRN

## 2011-05-02 MED ORDER — NALOXONE HCL 0.4 MG/ML IJ SOLN: 0.4000 mg | INTRAMUSCULAR | Status: DC | PRN

## 2011-05-02 MED ORDER — PROPOFOL 10 MG/ML IV EMUL
INTRAVENOUS | Status: DC | PRN
  Administered 2011-05-02: 200 mg via INTRAVENOUS

## 2011-05-02 MED ORDER — DIPHENHYDRAMINE HCL 12.5 MG/5ML PO ELIX: 12.5000 mg | ORAL_SOLUTION | ORAL | Status: DC | PRN

## 2011-05-02 MED ORDER — 0.9 % SODIUM CHLORIDE (POUR BTL) OPTIME
TOPICAL | Status: DC | PRN
  Administered 2011-05-02: 1000 mL

## 2011-05-02 MED ORDER — DOCUSATE SODIUM 100 MG PO CAPS
100.0000 mg | ORAL_CAPSULE | Freq: Two times a day (BID) | ORAL | Status: DC
  Administered 2011-05-02 – 2011-05-05 (×5): 100 mg via ORAL
  Filled 2011-05-02 (×7): qty 1

## 2011-05-02 MED ORDER — MORPHINE SULFATE (PF) 1 MG/ML IV SOLN
INTRAVENOUS | Status: DC
  Administered 2011-05-02: 8 mg via INTRAVENOUS
  Administered 2011-05-02: 6 mg via INTRAVENOUS
  Administered 2011-05-02 (×2): via INTRAVENOUS
  Administered 2011-05-03: 9 mg via INTRAVENOUS
  Administered 2011-05-03: 4 mg via INTRAVENOUS
  Administered 2011-05-03: 6 mg via INTRAVENOUS
  Administered 2011-05-03: 18:00:00 via INTRAVENOUS
  Administered 2011-05-03: 6 mg via INTRAVENOUS
  Filled 2011-05-02 (×4): qty 25

## 2011-05-02 MED ORDER — ACETAMINOPHEN 325 MG PO TABS
650.0000 mg | ORAL_TABLET | Freq: Four times a day (QID) | ORAL | Status: DC | PRN
  Filled 2011-05-02: qty 2

## 2011-05-02 MED ORDER — ALBUTEROL SULFATE HFA 108 (90 BASE) MCG/ACT IN AERS
2.0000 | INHALATION_SPRAY | Freq: Four times a day (QID) | RESPIRATORY_TRACT | Status: DC | PRN
  Filled 2011-05-02: qty 6.7

## 2011-05-02 MED ORDER — RIVAROXABAN 10 MG PO TABS
10.0000 mg | ORAL_TABLET | Freq: Every day | ORAL | Status: DC
  Administered 2011-05-03 – 2011-05-05 (×3): 10 mg via ORAL
  Filled 2011-05-02 (×3): qty 1

## 2011-05-02 MED ORDER — NEOSTIGMINE METHYLSULFATE 1 MG/ML IJ SOLN
INTRAMUSCULAR | Status: DC | PRN
  Administered 2011-05-02: 3 mg via INTRAVENOUS

## 2011-05-02 MED ORDER — SODIUM CHLORIDE 0.9 % IV SOLN
INTRAVENOUS | Status: DC
  Administered 2011-05-02 – 2011-05-03 (×3): via INTRAVENOUS
  Administered 2011-05-04: 75 mL/h via INTRAVENOUS
  Administered 2011-05-05: 02:00:00 via INTRAVENOUS

## 2011-05-02 MED ORDER — MIDAZOLAM HCL 5 MG/5ML IJ SOLN
INTRAMUSCULAR | Status: DC | PRN
  Administered 2011-05-02: 2 mg via INTRAVENOUS

## 2011-05-02 MED ORDER — MORPHINE SULFATE 2 MG/ML IJ SOLN
1.0000 mg | INTRAMUSCULAR | Status: DC | PRN
  Administered 2011-05-02: 1 mg via INTRAVENOUS
  Filled 2011-05-02: qty 1

## 2011-05-02 MED ORDER — DOCUSATE SODIUM 100 MG PO CAPS
100.0000 mg | ORAL_CAPSULE | Freq: Two times a day (BID) | ORAL | Status: DC
  Filled 2011-05-02 (×2): qty 1

## 2011-05-02 MED ORDER — CEFAZOLIN SODIUM-DEXTROSE 2-3 GM-% IV SOLR
2.0000 g | INTRAVENOUS | Status: AC
  Administered 2011-05-02: 2 g via INTRAVENOUS

## 2011-05-02 MED ORDER — METHOCARBAMOL 500 MG PO TABS
500.0000 mg | ORAL_TABLET | Freq: Four times a day (QID) | ORAL | Status: DC | PRN
  Administered 2011-05-02 – 2011-05-03 (×2): 500 mg via ORAL
  Filled 2011-05-02 (×2): qty 1

## 2011-05-02 MED ORDER — GLYCOPYRROLATE 0.2 MG/ML IJ SOLN
INTRAMUSCULAR | Status: DC | PRN
  Administered 2011-05-02: .4 mg via INTRAVENOUS

## 2011-05-02 MED ORDER — DIPHENHYDRAMINE HCL 50 MG/ML IJ SOLN: 12.5000 mg | Freq: Four times a day (QID) | INTRAMUSCULAR | Status: DC | PRN

## 2011-05-02 MED ORDER — CEFAZOLIN SODIUM 1-5 GM-% IV SOLN
1.0000 g | Freq: Four times a day (QID) | INTRAVENOUS | Status: AC
  Administered 2011-05-02 – 2011-05-03 (×3): 1 g via INTRAVENOUS
  Filled 2011-05-02 (×3): qty 50

## 2011-05-02 MED ORDER — PROMETHAZINE HCL 25 MG/ML IJ SOLN: 6.2500 mg | INTRAMUSCULAR | Status: DC | PRN

## 2011-05-02 MED ORDER — HETASTARCH-ELECTROLYTES 6 % IV SOLN
INTRAVENOUS | Status: DC | PRN
  Administered 2011-05-02: 11:00:00 via INTRAVENOUS

## 2011-05-02 MED ORDER — FENTANYL CITRATE 0.05 MG/ML IJ SOLN
INTRAMUSCULAR | Status: DC | PRN
  Administered 2011-05-02: 100 ug via INTRAVENOUS
  Administered 2011-05-02: 25 ug via INTRAVENOUS
  Administered 2011-05-02 (×2): 50 ug via INTRAVENOUS
  Administered 2011-05-02: 100 ug via INTRAVENOUS
  Administered 2011-05-02: 25 ug via INTRAVENOUS

## 2011-05-02 MED ORDER — HYDROMORPHONE HCL PF 1 MG/ML IJ SOLN
0.2500 mg | INTRAMUSCULAR | Status: DC | PRN
  Administered 2011-05-02 (×2): 0.5 mg via INTRAVENOUS

## 2011-05-02 MED ORDER — ESMOLOL HCL 10 MG/ML IV SOLN
INTRAVENOUS | Status: DC | PRN
  Administered 2011-05-02: 10 mg via INTRAVENOUS

## 2011-05-02 SURGICAL SUPPLY — 37 items
BAG SPEC THK2 15X12 ZIP CLS (MISCELLANEOUS) ×1
BAG ZIPLOCK 12X15 (MISCELLANEOUS) ×3 IMPLANT
BLADE SAW SGTL 18X1.27X75 (BLADE) ×2 IMPLANT
CELLS DAT CNTRL 66122 CELL SVR (MISCELLANEOUS) ×1 IMPLANT
CLOTH BEACON ORANGE TIMEOUT ST (SAFETY) ×2 IMPLANT
DRAPE C-ARM 42X72 X-RAY (DRAPES) ×2 IMPLANT
DRAPE STERI IOBAN 125X83 (DRAPES) ×2 IMPLANT
DRAPE U-SHAPE 47X51 STRL (DRAPES) ×6 IMPLANT
DRSG MEPILEX BORDER 4X8 (GAUZE/BANDAGES/DRESSINGS) ×2 IMPLANT
DURAPREP 26ML APPLICATOR (WOUND CARE) ×2 IMPLANT
ELECT BLADE TIP CTD 4 INCH (ELECTRODE) ×2 IMPLANT
ELECT REM PT RETURN 9FT ADLT (ELECTROSURGICAL) ×2
ELECTRODE REM PT RTRN 9FT ADLT (ELECTROSURGICAL) ×1 IMPLANT
FACESHIELD LNG OPTICON STERILE (SAFETY) ×8 IMPLANT
GAUZE XEROFORM 1X8 LF (GAUZE/BANDAGES/DRESSINGS) ×2 IMPLANT
GLOVE BIO SURGEON STRL SZ7 (GLOVE) ×1 IMPLANT
GLOVE BIO SURGEON STRL SZ7.5 (GLOVE) ×2 IMPLANT
GLOVE BIOGEL PI IND STRL 7.5 (GLOVE) IMPLANT
GLOVE BIOGEL PI IND STRL 8 (GLOVE) ×1 IMPLANT
GLOVE BIOGEL PI INDICATOR 7.5 (GLOVE)
GLOVE BIOGEL PI INDICATOR 8 (GLOVE) ×1
GLOVE ECLIPSE 7.0 STRL STRAW (GLOVE) ×1 IMPLANT
GOWN STRL REIN XL XLG (GOWN DISPOSABLE) ×4 IMPLANT
KIT BASIN OR (CUSTOM PROCEDURE TRAY) ×2 IMPLANT
PACK TOTAL JOINT (CUSTOM PROCEDURE TRAY) ×2 IMPLANT
PADDING CAST COTTON 6X4 STRL (CAST SUPPLIES) ×2 IMPLANT
RETRACTOR WND ALEXIS 18 MED (MISCELLANEOUS) ×1 IMPLANT
RTRCTR WOUND ALEXIS 18CM MED (MISCELLANEOUS) ×2
SPONGE GAUZE 4X4 12PLY (GAUZE/BANDAGES/DRESSINGS) ×1 IMPLANT
STAPLER SKIN PROX WIDE 3.9 (STAPLE) ×1 IMPLANT
SUT ETHIBOND NAB CT1 #1 30IN (SUTURE) ×3 IMPLANT
SUT VIC AB 1 CT1 36 (SUTURE) ×4 IMPLANT
SUT VIC AB 2-0 CT1 27 (SUTURE) ×4
SUT VIC AB 2-0 CT1 TAPERPNT 27 (SUTURE) ×2 IMPLANT
TOWEL OR 17X26 10 PK STRL BLUE (TOWEL DISPOSABLE) ×4 IMPLANT
TOWEL OR NON WOVEN STRL DISP B (DISPOSABLE) ×2 IMPLANT
TRAY FOLEY CATH 14FRSI W/METER (CATHETERS) ×2 IMPLANT

## 2011-05-02 NOTE — Anesthesia Postprocedure Evaluation (Signed)
  Anesthesia Post-op Note  Patient: Katrina [name]  Procedure(s) Performed:  TOTAL HIP ARTHROPLASTY ANTERIOR APPROACH - Left Total Hip Arthroplasty, Direct Anterior Approach  Patient Location: PACU  Anesthesia Type: General  Level of Consciousness: awake and alert   Airway and Oxygen Therapy: Patient Spontanous Breathing  Post-op Pain: mild  Post-op Assessment: Post-op Vital signs reviewed, Patient's Cardiovascular Status Stable, Respiratory Function Stable, Patent Airway and No signs of Nausea or vomiting  Post-op Vital Signs: stable  Complications: No apparent anesthesia complications

## 2011-05-02 NOTE — Brief Op Note (Signed)
05/02/2011  12:01 PM  PATIENT:  Katrina [name]  56 y.o. female  PRE-OPERATIVE DIAGNOSIS:  Severe osteoarthritis left hip  POST-OPERATIVE DIAGNOSIS:  Severe osteoarthritis left hip  PROCEDURE:  Procedure(s): TOTAL HIP ARTHROPLASTY ANTERIOR APPROACH  SURGEON:  Surgeon(s): Kathryne Hitch, MD  PHYSICIAN ASSISTANT:   ASSISTANTS: none   ANESTHESIA:   general  EBL:  Total I/O In: 2500 [I.V.:2000; IV Piggyback:500] Out: 500 [Urine:200; Blood:300]  BLOOD ADMINISTERED:none  DRAINS: none   LOCAL MEDICATIONS USED:  NONE  SPECIMEN:  No Specimen  DISPOSITION OF SPECIMEN:  N/A  COUNTS:  YES  TOURNIQUET:  * No tourniquets in log *  DICTATION: Dictated #[id number]  PLAN OF CARE: Admit to inpatient   PATIENT DISPOSITION:  PACU - hemodynamically stable.   Delay start of Pharmacological VTE agent (>24hrs) due to surgical blood loss or risk of bleeding:  {YES/NO/NOT APPLICABLE:20182

## 2011-05-02 NOTE — H&P (Signed)
Katrina [name] is an 56 y.o. female.   Chief Complaint:   Severe left hip pain; know end-stage OA left hip HPI:   56yo female well know to me with documented end-stage arthritis of her left hip.  Has failed conservative treatment.  X-rays show bone-on-bone wear.  Wishes to proceed with a left total hip replacement.  Understands the risks of acute blood loss, fracture, DVT, PE.  The goals are increased mobility and quality of life with decreased pain.  Past Medical History  Diagnosis Date  . Asthma     hx of asthma 3/12- hospitalized   . Arthritis     knees, hips, elbows   . Depression     hx of depression     Past Surgical History  Procedure Date  . Joint replacement   . Other surgical history     arthroswcopic surgery right knee   . Other surgical history     arthroscopic surgery left knee x 2   . Other surgical history     bunionectomy right foot   . Back surgery     x2  . Other surgical history     C Section x 2   . Cholecystectomy   . Other surgical history     carpal tunnel on left     No family history on file. Social History:  reports that she has been smoking Cigarettes.  She has a 22 pack-year smoking history. She has never used smokeless tobacco. She reports that she drinks about 28.8 ounces of alcohol per week. She reports that she does not use illicit drugs.  Allergies:  Allergies  Allergen Reactions  . Codeine Itching and Nausea Only    No current facility-administered medications on file as of .   Medications Prior to Admission  Medication Sig Dispense Refill  . albuterol (PROVENTIL HFA;VENTOLIN HFA) 108 (90 BASE) MCG/ACT inhaler Inhale 2 puffs into the lungs every 6 (six) hours as needed. For shortness of breath/wheeze/cough      . aspirin EC 81 MG tablet Take 81 mg by mouth daily.      . Multiple Vitamin (MULITIVITAMIN WITH MINERALS) TABS Take 1 tablet by mouth daily.        No results found for this or any previous visit (from the past 48  hour(s)). No results found.  Review of Systems  All other systems reviewed and are negative.    There were no vitals taken for this visit. Physical Exam  Constitutional: She is oriented to person, place, and time. She appears well-developed and well-nourished.  HENT:  Head: Normocephalic and atraumatic.  Eyes: Pupils are equal, round, and reactive to light.  Neck: Neck supple.  Cardiovascular: Normal rate and regular rhythm.   Respiratory: Effort normal and breath sounds normal.  GI: Soft. Bowel sounds are normal.  Musculoskeletal:       Left hip: She exhibits decreased range of motion, bony tenderness and crepitus.  Neurological: She is alert and oriented to person, place, and time.  Skin: Skin is warm and dry.  Psychiatric: She has a normal mood and affect.     Assessment/Plan To the OR today for a left total hip replacement then admission as an inpatient.  Courtny Bennison Y 05/02/2011, 7:11 AM

## 2011-05-02 NOTE — Transfer of Care (Signed)
Immediate Anesthesia Transfer of Care Note  Patient: Katrina [name]  Procedure(s) Performed:  TOTAL HIP ARTHROPLASTY ANTERIOR APPROACH - Left Total Hip Arthroplasty, Direct Anterior Approach  Patient Location: PACU  Anesthesia Type: General  Level of Consciousness: awake, alert  and oriented  Airway & Oxygen Therapy: Patient Spontanous Breathing and aerosol face mask  Post-op Assessment: Report given to PACU RN and Post -op Vital signs reviewed and stable  Post vital signs: Reviewed and stable  Complications: No apparent anesthesia complications

## 2011-05-02 NOTE — Anesthesia Preprocedure Evaluation (Addendum)
Anesthesia Evaluation  Patient identified by MRN, date of birth, ID band Patient awake    Reviewed: Allergy & Precautions, H&P , NPO status , Patient's Chart, lab work & pertinent test results  Airway Mallampati: II TM Distance: >3 FB Neck ROM: Full    Dental   Poor dentition. Denies loose teeth.:   Pulmonary asthma , Current Smoker,  clear to auscultation  Pulmonary exam normal       Cardiovascular neg cardio ROS Regular Normal    Neuro/Psych  Headaches, PSYCHIATRIC DISORDERS Depression    GI/Hepatic negative GI ROS, Neg liver ROS,   Endo/Other  Negative Endocrine ROS  Renal/GU negative Renal ROS  Genitourinary negative   Musculoskeletal negative musculoskeletal ROS (+)   Abdominal (+) obese,   Peds negative pediatric ROS (+)  Hematology negative hematology ROS (+)   Anesthesia Other Findings   Reproductive/Obstetrics negative OB ROS                         Anesthesia Physical Anesthesia Plan  ASA: II  Anesthesia Plan: General   Post-op Pain Management:    Induction: Intravenous  Airway Management Planned: Oral ETT  Additional Equipment:   Intra-op Plan:   Post-operative Plan: Extubation in OR  Informed Consent: I have reviewed the patients History and Physical, chart, labs and discussed the procedure including the risks, benefits and alternatives for the proposed anesthesia with the patient or authorized representative who has indicated his/her understanding and acceptance.   Dental advisory given  Plan Discussed with: CRNA  Anesthesia Plan Comments: (Bad headache today. Discussed r/b general versus spinal. Prefers general.)       Anesthesia Quick Evaluation

## 2011-05-02 NOTE — Progress Notes (Signed)
Portable ap pelvis and lateral left hip x-rays done 

## 2011-05-03 LAB — BASIC METABOLIC PANEL
BUN: 9 mg/dL (ref 6–23)
GFR calc Af Amer: >90 mL/min (ref >90)
GFR calc non Af Amer: >90 mL/min (ref >90)
Potassium: 4 mEq/L (ref 3.5–5.1)

## 2011-05-03 LAB — CBC
HCT: 31.4 % — ABNORMAL LOW (ref 36.0–46.0)
MCHC: 32.5 g/dL (ref 30.0–36.0)
RDW: 15.3 % (ref 11.5–15.5)

## 2011-05-03 MED ORDER — MORPHINE SULFATE (PF) 1 MG/ML IV SOLN
INTRAVENOUS | Status: DC
  Administered 2011-05-03: 7 mg via INTRAVENOUS
  Administered 2011-05-04: 3 mg via INTRAVENOUS
  Administered 2011-05-04: 8 mg via INTRAVENOUS
  Administered 2011-05-04: 2 mg via INTRAVENOUS

## 2011-05-03 NOTE — Evaluation (Signed)
Physical Therapy Evaluation Patient Details Name: Katrina [name] MRN: [id number] DOB: 24-Nov-1955 Today's Date: 05/03/2011 Time: 409-811 Charge: EVII  Problem List:  Patient Active Problem List  Diagnoses  . Degenerative arthritis of hip    Past Medical History:  Past Medical History  Diagnosis Date  . Asthma     hx of asthma 3/12- hospitalized   . Arthritis     knees, hips, elbows   . Depression     hx of depression    Past Surgical History:  Past Surgical History  Procedure Date  . Joint replacement   . Other surgical history     arthroswcopic surgery right knee   . Other surgical history     arthroscopic surgery left knee x 2   . Other surgical history     bunionectomy right foot   . Back surgery     x2  . Other surgical history     C Section x 2   . Cholecystectomy   . Other surgical history     carpal tunnel on left     PT Assessment/Plan/Recommendation PT Assessment Clinical Impression Statement: Pt s/p L direct anterior THR and would benefit from acute PT services in order to improve independence with mobility and stairs for safe d/c home with family. PT Recommendation/Assessment: Patient will need skilled PT in the acute care venue PT Problem List: Decreased strength;Decreased mobility;Decreased range of motion;Decreased safety awareness;Decreased knowledge of use of DME;Pain PT Therapy Diagnosis : Acute pain;Difficulty walking PT Plan PT Frequency: 7X/week PT Treatment/Interventions: DME instruction;Gait training;Functional mobility training;Therapeutic exercise;Therapeutic activities;Patient/family education;Neuromuscular re-education PT Recommendation Follow Up Recommendations: Home health PT Equipment Recommended: None recommended by PT PT Goals  Acute Rehab PT Goals PT Goal Formulation: With patient Time For Goal Achievement: 7 days Pt will go Supine/Side to Sit: with supervision PT Goal: Supine/Side to Sit - Progress: Goal set today Pt will  go Sit to Supine/Side: with supervision PT Goal: Sit to Supine/Side - Progress: Goal set today Pt will go Sit to Stand: with supervision PT Goal: Sit to Stand - Progress: Goal set today Pt will go Stand to Sit: with supervision PT Goal: Stand to Sit - Progress: Goal set today Pt will Ambulate: 51 - 150 feet;with supervision PT Goal: Ambulate - Progress: Goal set today Pt will Go Up / Down Stairs: 1-2 stairs;with min assist;with rolling walker PT Goal: Up/Down Stairs - Progress: Goal set today Pt will Perform Home Exercise Program: with supervision, verbal cues required/provided PT Goal: Perform Home Exercise Program - Progress: Goal set today  PT Evaluation Precautions/Restrictions  Precautions Precaution Comments: None - direct anterior approach Restrictions LLE Weight Bearing: Weight bearing as tolerated Prior Functioning  Home Living Lives With: Family Type of Home: House Home Layout: One level Home Access: Stairs to enter Entrance Stairs-Rails: None Entrance Stairs-Number of Steps: 2 Home Adaptive Equipment: Raised toilet seat with rails;Walker - rolling;Straight cane Prior Function Level of Independence: Requires assistive device for independence Comments: uses straight cane Cognition Cognition Arousal/Alertness: Awake/alert Overall Cognitive Status: Appears within functional limits for tasks assessed Sensation/Coordination   Extremity Assessment RLE Assessment RLE Assessment: Within Functional Limits LLE Strength LLE Overall Strength Comments: NT 2* pain with movement but observed decreased active ROM with functional activities and requires assist to move L LE with bed mobility Mobility (including Balance) Bed Mobility Bed Mobility: Yes Supine to Sit: 4: Min assist Supine to Sit Details (indicate cue type and reason): assist for trunk and L LE Sitting -  Scoot to [place] of Bed: 5: Supervision Sitting - Scoot to [place] of Bed Details (indicate cue type and reason):  verbal cues for technique Transfers Transfers: Yes Sit to Stand: 4: Min assist;From elevated surface;With upper extremity assist;From bed;From chair/3-in-1 Sit to Stand Details (indicate cue type and reason): min/guard, verbal cues for safe technique Stand to Sit: 4: Min assist;To chair/3-in-1;With upper extremity assist Stand to Sit Details: assist to slide L LE forward, verbal cues for hand placement Stand Pivot Transfers: 4: Min assist Stand Pivot Transfer Details (indicate cue type and reason): min/guard, increased verbal cues for technique Ambulation/Gait Ambulation/Gait: Yes Ambulation/Gait Assistance: 4: Min assist Ambulation/Gait Assistance Details (indicate cue type and reason): assist for support with L LE weightbearing, decreased active ROM of L LE with a few steps to recliner, verbal cues for technique Ambulation Distance (Feet): 4 Feet Assistive device: Rolling walker Gait Pattern: Step-to pattern;Decreased stance time - left;Decreased hip/knee flexion - left;Antalgic    Exercise    End of Session PT - End of Session Activity Tolerance: Patient limited by pain Patient left: in chair;with call bell in reach;with family/visitor present Nurse Communication: Mobility status for transfers General Behavior During Session:  (pt seemed groggy from meds) Cognition: WFL for tasks performed  Katrina [name],Katrina [name] 05/03/2011, 11:50 AM Pager: [phone]

## 2011-05-03 NOTE — Plan of Care (Signed)
Problem: Phase II Progression Outcomes Goal: Ambulates Outcome: Progressing Pt able to take a few steps to recliner.  Will continue to progress to hallway ambulation.

## 2011-05-03 NOTE — Progress Notes (Signed)
Subjective: 1 Day Post-Op Procedure(s) (LRB): TOTAL HIP ARTHROPLASTY ANTERIOR APPROACH (Left)  Patient reports pain as mild.    Objective:   VITALS:  Temp:  [97.9 F (36.6 C)-99.5 F (37.5 C)] 99.5 F (37.5 C) (01/26 1420) Pulse Rate:  [90-120] 116  (01/26 1420) Resp:  [18] 18  (01/26 1748) BP: (110-131)/(72-79) 120/79 mmHg (01/26 1420) SpO2:  [94 %-100 %] 94 % (01/26 1748)  Neurologically intact ABD soft Neurovascular intact Sensation intact distally Intact pulses distally Dorsiflexion/Plantar flexion intact Incision: dressing C/D/I   LABS  Basename 05/03/11 0404  HGB 10.2*  WBC 11.3*  PLT 196    Basename 05/03/11 0404  NA 136  K 4.0  CL 104  CO2 27  BUN 9  CREATININE 0.72  GLUCOSE 110*   No results found for this basename: LABPT:2,INR:2 in the last 72 hours   Assessment/Plan: 1 Day Post-Op Procedure(s) (LRB): TOTAL HIP ARTHROPLASTY ANTERIOR APPROACH (Left)  Advance diet Up with therapy Try to decrease pain meds and decrease somulence.   NITKA,JAMES E 05/03/2011, 6:56 PM

## 2011-05-03 NOTE — Progress Notes (Signed)
Cm spoke with pt with adult children at bedside concerning d/c planning. Per pt [facility] to provide HHPT. No DME requested, pt states having RW, 3n1, and crutches from previous orthopedic surgery. Adult children verified to assist with home care. Pt agrees with d./c plan.  Leonie Green [phone]

## 2011-05-03 NOTE — Progress Notes (Signed)
Physical Therapy Treatment Patient Details Name: Katrina [name] MRN: [id number] DOB: 23-Nov-1955 Today's Date: 05/03/2011 Time: 4098-1191 Charge: TE PT Assessment/Plan  PT - Assessment/Plan Comments on Treatment Session: Pt educated on and performed exercises.  Pt required manual cues with exercises to keep hip in neutral position (tendency to allow knee to roll inward). PT Plan: Discharge plan remains appropriate;Frequency remains appropriate PT Frequency: 7X/week Follow Up Recommendations: Home health PT Equipment Recommended: None recommended by PT PT Goals  Acute Rehab PT Goals PT Goal Formulation: With patient Time For Goal Achievement: 7 days Pt will go Supine/Side to Sit: with supervision PT Goal: Supine/Side to Sit - Progress: Goal set today Pt will go Sit to Supine/Side: with supervision PT Goal: Sit to Supine/Side - Progress: Goal set today Pt will go Sit to Stand: with supervision PT Goal: Sit to Stand - Progress: Goal set today Pt will go Stand to Sit: with supervision PT Goal: Stand to Sit - Progress: Goal set today Pt will Ambulate: 51 - 150 feet;with supervision PT Goal: Ambulate - Progress: Goal set today Pt will Go Up / Down Stairs: 1-2 stairs;with min assist;with rolling walker PT Goal: Up/Down Stairs - Progress: Goal set today Pt will Perform Home Exercise Program: with supervision, verbal cues required/provided PT Goal: Perform Home Exercise Program - Progress: Progressing toward goal  PT Treatment Precautions/Restrictions  Precautions Precaution Comments: None - direct anterior approach Restrictions LLE Weight Bearing: Weight bearing as tolerated Mobility (including Balance) Bed Mobility Bed Mobility: No (pt declined OOB as she just got back from using BSC) Supine to Sit: 4: Min assist Supine to Sit Details (indicate cue type and reason): assist for trunk and L LE Sitting - Scoot to Edge of Bed: 5: Supervision Sitting - Scoot to Edge of Bed Details  (indicate cue type and reason): verbal cues for technique Transfers Transfers: Yes Sit to Stand: 4: Min assist;From elevated surface;With upper extremity assist;From bed;From chair/3-in-1 Sit to Stand Details (indicate cue type and reason): min/guard, verbal cues for safe technique Stand to Sit: 4: Min assist;To chair/3-in-1;With upper extremity assist Stand to Sit Details: assist to slide L LE forward, verbal cues for hand placement Stand Pivot Transfers: 4: Min assist Stand Pivot Transfer Details (indicate cue type and reason): min/guard, increased verbal cues for technique Ambulation/Gait Ambulation/Gait: Yes Ambulation/Gait Assistance: 4: Min assist Ambulation/Gait Assistance Details (indicate cue type and reason): assist for support with L LE weightbearing, decreased active ROM of L LE with a few steps to recliner, verbal cues for technique Ambulation Distance (Feet): 4 Feet Assistive device: Rolling walker Gait Pattern: Step-to pattern;Decreased stance time - left;Decreased hip/knee flexion - left;Antalgic    Exercise  Total Joint Exercises Ankle Circles/Pumps: AROM;Both;20 reps;Supine Quad Sets: AROM;Strengthening;Both;20 reps;Supine Short Arc Quad: AROM;Strengthening;Left;10 reps;Supine Heel Slides: AAROM;Strengthening;Left;10 reps;Supine Hip ABduction/ADduction: AAROM;Strengthening;Left;10 reps;Supine End of Session PT - End of Session Activity Tolerance: Patient tolerated treatment well Patient left: in bed;with call bell in reach Nurse Communication: Mobility status for transfers General Behavior During Session: Lethargic (continues to be groggy) Cognition: WFL for tasks performed  Akyah Lagrange,[name] 05/03/2011, 2:53 PM Pager: [phone]

## 2011-05-03 NOTE — Op Note (Signed)
NAMEDANEILLE, DESILVA NO.:  [id number]  MEDICAL RECORD NO.:  [id number]  LOCATION:  1540                         FACILITY:  [facility]  PHYSICIAN:  Vanita Panda. Magnus Ivan, M.D.DATE OF BIRTH:  [birth year]  DATE OF PROCEDURE:  05/02/2011 DATE OF DISCHARGE:                              OPERATIVE REPORT   PREOPERATIVE DIAGNOSES:  Left hip severe osteoarthritis and degenerative joint disease.  POSTOPERATIVE DIAGNOSES:  Left hip severe osteoarthritis and degenerative joint disease.  PROCEDURE:  Left total hip arthroplasty through direct anterior approach.  IMPLANTS:  DePuy Sector Gription acetabular component size 52, size 36 +4 neutral polyethylene liner, size 9 Corail femoral component with standard offset, size 36 +1.5 ceramic hip ball.  SURGEON:  Vanita Panda. Magnus Ivan, MD  ANESTHESIA:  General.  ANTIBIOTICS:  2 g IV Ancef.  BLOOD LOSS:  300 cc.  COMPLICATIONS:  None.  INDICATIONS:  Ms. Tapia is a 56 year old with end-stage arthritis, involving her left hip.  She has well-documented bone on bone wear and radiographic evidence showing this.  This affects her activities of daily living and her quality of life and her pain is severe.  She wished to proceed with a total hip arthroplasty given the failure of conservative treatment.  She understands the risks and benefits of this in detail.  PROCEDURE IN DETAIL:  After informed consent was obtained, appropriate left hip was marked.  She was brought to the operating room.  General anesthesia was obtained while she was on a stretcher.  A Foley catheter was placed and then traction boots were placed on both of her feet.  She was then placed supine on the Hana fracture table with perineal post in place and both legs in the traction devices with no traction applied. The left hip was then prepped and draped with DuraPrep and sterile drapes.  A time-out was called and she was identified as the correct patient and  correct left hip.  I then made an incision just distal and posterior to the anterior superior iliac spine and carried this obliquely down the leg.  I dissected down to the tensor fascia lata and then divided this obliquely.  I proceeded with a direct anterior approach to the hip.  Cobra retractor was placed around the lateral neck and then one up under the rectus femoris.  I coagulated the lateral femoral circumflex vessels and then divided the hip capsule.  I put the Cobra retractors within the hip capsule.  I then made my femoral neck cut proximal to the lesser trochanter.  Completed the cut with an osteotome and then placed a cork screw guide into the femoral head and removed this in its entirety.  When the acetabulum was exposed, I placed a Bent Hohmann medially and a Cobra retractor laterally.  I was able to clean the acetabulum debris.  I then began reaming from size 44 reamer and 2 mm increments up to a size 52.  With the 52 being the last ream that I placed under direct visualization as well as direct fluoroscopy to get my inclination and anteversion.  I then placed a real size 52 Sector Gription acetabular component and a hole eliminator guide.  I placed  the real 36 neutral polyethylene liner +4.  Once this was in place, I then placed a temporary hook underneath the vastus ridge of the femur.  The hip was externally rotated to 90 degrees, extended and adducted to allow exposure to the femoral canal.  Retractor was placed medially and behind the greater trochanter.  I released the piriformis and brought the femur up higher.  I then used the box cutting guide and a rongeur to lateralize and opened up the femoral canal.  I broached from a size 8, broached this to a size 9 broach and this was actually quite tight. I trialed a standard neck with a size 36 +1.5 hip ball and reduced this in the acetabulum.  I brought the leg over and up, traction and internal rotation.  There was minimal  shuck and her leg lengths were measured to be equal under direct fluoroscopy.  She had excellent range of motion which was stable.  I then removed all trial components and placed the real size 9 with standard offset Corail femoral component with a real 36 +1.5 ceramic hip ball.  We reduced this in the acetabulum and again it was stable.  I copiously irrigated the tissues with normal saline and closed the joint capsule with interrupted #1 Ethibond suture followed by a running #1 Vicryl in the tensor fascia lata, 2-0 Vicryl in subcutaneous tissue, and interrupted staples on the skin.  Well-padded dressing was applied.  She was taken off the Hana table, awakened, extubated, and taken to recovery room in stable condition.  All final counts were correct.  There were no complications noted.     Vanita Panda. Magnus Ivan, M.D.     CYB/MEDQ  D:  05/02/2011  T:  05/03/2011  Job:  [id number]

## 2011-05-04 LAB — CBC
Hemoglobin: 9.4 g/dL — ABNORMAL LOW (ref 12.0–15.0)
MCHC: 32 g/dL (ref 30.0–36.0)
Platelets: 208 10*3/uL (ref 150–400)
RDW: 15.4 % (ref 11.5–15.5)

## 2011-05-04 MED ORDER — HYDROCODONE-ACETAMINOPHEN 5-325 MG PO TABS
1.0000 | ORAL_TABLET | ORAL | Status: DC | PRN
  Administered 2011-05-04 – 2011-05-05 (×4): 1 via ORAL
  Filled 2011-05-04 (×4): qty 1

## 2011-05-04 NOTE — Progress Notes (Signed)
Physical Therapy Treatment Patient Details Name: Katrina [name] MRN: [id number] DOB: 10-02-1955 Today's Date: 05/04/2011 Time: 7829-5621 Charge: TE PT Assessment/Plan  PT - Assessment/Plan Comments on Treatment Session: Pt had PCA d/ced and now taking PO meds to assist with decreased grogginess.  Pt still feeling groggy this afternoon however so only performed exercises in supine.  Pt continues to require manual cue for neutral hip rotation with exercises. PT Plan: Discharge plan remains appropriate;Frequency remains appropriate Follow Up Recommendations: Home health PT Equipment Recommended: None recommended by PT PT Goals  Acute Rehab PT Goals PT Goal: Perform Home Exercise Program - Progress: Progressing toward goal  PT Treatment Precautions/Restrictions  Precautions Precaution Comments: None - direct anterior approach Restrictions Weight Bearing Restrictions: Yes LLE Weight Bearing: Weight bearing as tolerated Mobility (including Balance) Bed Mobility Bed Mobility: No    Exercise  Total Joint Exercises Ankle Circles/Pumps: AROM;Both;20 reps;Supine Quad Sets: AROM;Strengthening;Both;20 reps;Supine Gluteal Sets: AROM;Strengthening;Both;10 reps;Supine Short Arc Quad: AROM;Strengthening;Left;10 reps;Supine Heel Slides: AAROM;Strengthening;Left;10 reps;Supine Hip ABduction/ADduction: AAROM;Strengthening;Left;10 reps;Supine End of Session PT - End of Session Activity Tolerance: Patient tolerated treatment well Patient left: in bed;with call bell in reach;with family/visitor present General Behavior During Session: Lethargic Cognition: WFL for tasks performed  [facility] E 05/04/2011, 3:43 PM Pager: [phone]

## 2011-05-04 NOTE — Progress Notes (Addendum)
Physical Therapy Treatment Patient Details Name: Katrina [name] MRN: [id number] DOB: 1955-10-12 Today's Date: 05/04/2011 Time: 846-962 Charge: Leonia Reeves 2 PT Assessment/Plan  PT - Assessment/Plan Comments on Treatment Session: Pt able to ambulate in hallway a short distance today although she continues to feel "groggy" and fell asleep immediately upon returning to supine. PT Plan: Discharge plan remains appropriate;Frequency remains appropriate Follow Up Recommendations: Home health PT Equipment Recommended: None recommended by PT PT Goals  Acute Rehab PT Goals PT Goal: Supine/Side to Sit - Progress: Progressing toward goal PT Goal: Sit to Supine/Side - Progress: Progressing toward goal PT Goal: Sit to Stand - Progress: Progressing toward goal PT Goal: Stand to Sit - Progress: Progressing toward goal PT Goal: Ambulate - Progress: Progressing toward goal  PT Treatment Precautions/Restrictions  Precautions Precaution Comments: None - direct anterior approach Restrictions Weight Bearing Restrictions: Yes LLE Weight Bearing: Weight bearing as tolerated Mobility (including Balance) Bed Mobility Bed Mobility: Yes Supine to Sit: 2: Max assist Supine to Sit Details (indicate cue type and reason): increased assist today for trunk and supporting L LE Sit to Supine: 3: Mod assist Sit to Supine - Details (indicate cue type and reason): assist for bilateral LEs onto bed Transfers Transfers: Yes Sit to Stand: 4: Min assist;With upper extremity assist;From bed;From elevated surface Sit to Stand Details (indicate cue type and reason): assist for weakness, verbal cues for safe technique Stand to Sit: 4: Min assist;With upper extremity assist;To bed;To elevated surface Stand to Sit Details: verbal cues for hand placement, assist to control descent Ambulation/Gait Ambulation/Gait: Yes Ambulation/Gait Assistance: 4: Min assist Ambulation/Gait Assistance Details (indicate cue type and reason):  min/guard, increased verbal cues for sequence, pt reports she needs to step with her R LE first because she's right dominant and pt educated on reason for advancing L LE first especially when she continued to drag L LE behind 2* weakness and inability to lift foot from floor. Ambulation Distance (Feet): 46 Feet Assistive device: Rolling walker Gait Pattern: Step-to pattern;Decreased dorsiflexion - left;Decreased hip/knee flexion - left    Exercise    End of Session PT - End of Session Activity Tolerance: Patient tolerated treatment well Patient left: in bed;with call bell in reach;with family/visitor present General Behavior During Session: Lethargic (continues to be groggy) Cognition: WFL for tasks performed  Katrina [name],Katrina [name] 05/04/2011, 10:00 AM Pager: [phone]

## 2011-05-04 NOTE — Progress Notes (Signed)
Subjective: 2 Days Post-Op Procedure(s) (LRB): TOTAL HIP ARTHROPLASTY ANTERIOR APPROACH (Left)  Patient reports pain as moderate.    Objective:   VITALS:  Temp:  [98.1 F (36.7 C)-99.5 F (37.5 C)] 98.3 F (36.8 C) (01/27 0530) Pulse Rate:  [112-116] 112  (01/27 0530) Resp:  [18] 18  (01/27 0812) BP: (109-130)/(67-82) 109/67 mmHg (01/27 0530) SpO2:  [94 %-99 %] 99 % (01/27 0812)  Neurologically intact ABD soft Neurovascular intact Sensation intact distally Dorsiflexion/Plantar flexion intact Dressing left anterior hip change today incision is healing nicely with minimal drainage bloody. Clinically the patient remains somewhat confused and her decreased energy levels. She falls asleep easily but today she is better than yesterday in terms of ease of somulence. I wonder if she doesn't have some amount of sleep apnea here.  LABS  Basename 05/04/11 0524 05/03/11 0404  HGB 9.4* 10.2*  WBC 16.4* 11.3*  PLT 208 196    Basename 05/03/11 0404  NA 136  K 4.0  CL 104  CO2 27  BUN 9  CREATININE 0.72  GLUCOSE 110*   No results found for this basename: LABPT:2,INR:2 in the last 72 hours   Assessment/Plan: 2 Days Post-Op Procedure(s) (LRB): TOTAL HIP ARTHROPLASTY ANTERIOR APPROACH (Left)  Advance diet Up with therapy Continue IV fluids as patient has decreased input of fluids do to somulence. Work to decrease use of narcotic medications as they seem to decrease the patient's energy and make her less likely to thrive in physical therapy.  NITKA,JAMES E 05/04/2011, 11:06 AM

## 2011-05-04 NOTE — Progress Notes (Signed)
OT Note:  Attempted eval.  Pt initially nauseaus and RN gave her medicine.  She states that she doesn't feel up to OT eval right now:   Pt is sleepy and dozes in and out.  Will check back tomorrow.  [place], [place] [phone] 05/04/2011

## 2011-05-05 LAB — CBC
MCV: 94.5 fL (ref 78.0–100.0)
Platelets: 205 10*3/uL (ref 150–400)
RBC: 2.75 MIL/uL — ABNORMAL LOW (ref 3.87–5.11)
WBC: 14.4 10*3/uL — ABNORMAL HIGH (ref 4.0–10.5)

## 2011-05-05 MED ORDER — RIVAROXABAN 10 MG PO TABS: 10.0000 mg | ORAL_TABLET | Freq: Every day | ORAL | Status: DC

## 2011-05-05 MED ORDER — OXYCODONE-ACETAMINOPHEN 5-325 MG PO TABS: 1.0000 | ORAL_TABLET | ORAL | Status: AC | PRN

## 2011-05-05 MED ORDER — METHOCARBAMOL 500 MG PO TABS: 500.0000 mg | ORAL_TABLET | Freq: Four times a day (QID) | ORAL | Status: AC

## 2011-05-05 NOTE — Progress Notes (Signed)
Pt is alert and oriented, vital signs are stable, discharge instuctions reviewed with patient, prescriptions given and questions and concerns answered, incision to left hip dressing is clean dry and itnact, arragnements already made with [facility], iv taken out per order Means, Desi Carby N 05-05-11 11:03am

## 2011-05-05 NOTE — Discharge Summary (Signed)
Patient ID: Katrina [name] MRN: [id number] DOB/AGE: Oct 20, 1955 56 y.o.  Admit date: 05/02/2011 Discharge date: 05/05/2011  Admission Diagnoses:  Principal Problem:  *Degenerative arthritis of hip   Discharge Diagnoses:  Same  Past Medical History  Diagnosis Date  . Asthma     hx of asthma 3/12- hospitalized   . Arthritis     knees, hips, elbows   . Depression     hx of depression     Surgeries: Procedure(s): TOTAL HIP ARTHROPLASTY ANTERIOR APPROACH on 05/02/2011   Consultants:    Discharged Condition: Improved  Hospital Course: Katrina [name] is an 57 y.o. female who was admitted 05/02/2011 for operative treatment ofDegenerative arthritis of hip. Patient has severe unremitting pain that affects sleep, daily activities, and work/hobbies. After pre-op clearance the patient was taken to the operating room on 05/02/2011 and underwent  Procedure(s): TOTAL HIP ARTHROPLASTY ANTERIOR APPROACH.    Patient was given perioperative antibiotics: Anti-infectives     Start     Dose/Rate Route Frequency Ordered Stop   05/02/11 1600   ceFAZolin (ANCEF) IVPB 1 g/50 mL premix        1 g 100 mL/hr over 30 Minutes Intravenous Every 6 hours 05/02/11 1448 05/03/11 0432   05/02/11 0800   ceFAZolin (ANCEF) IVPB 2 g/50 mL premix        2 g 100 mL/hr over 30 Minutes Intravenous 60 min pre-op 05/02/11 0747 05/02/11 1000           Patient was given sequential compression devices, early ambulation, and chemoprophylaxis to prevent DVT.  Patient benefited maximally from hospital stay and there were no complications.    Recent vital signs: Patient Vitals for the past 24 hrs:  BP Temp Temp src Pulse Resp SpO2  05/05/11 0539 97/62 mmHg 98.1 F (36.7 C) Oral 105  17  92 %  05/04/11 2130 120/75 mmHg 97.9 F (36.6 C) Oral 105  16  97 %  05/04/11 1431 113/57 mmHg 99.2 F (37.3 C) Oral 117  16  95 %  05/04/11 0812 - - - - 18  99 %     Recent laboratory studies:  Basename 05/05/11 0426  05/04/11 0524 05/03/11 0404  WBC 14.4* 16.4* --  HGB 8.6* 9.4* --  HCT 26.0* 29.4* --  PLT 205 208 --  NA -- -- 136  K -- -- 4.0  CL -- -- 104  CO2 -- -- 27  BUN -- -- 9  CREATININE -- -- 0.72  GLUCOSE -- -- 110*  INR -- -- --  CALCIUM -- -- 8.3*     Discharge Medications:   Medication List  As of 05/05/2011  7:11 AM   TAKE these medications         albuterol 108 (90 BASE) MCG/ACT inhaler   Commonly known as: PROVENTIL HFA;VENTOLIN HFA   Inhale 2 puffs into the lungs every 6 (six) hours as needed. For shortness of breath/wheeze/cough      aspirin EC 81 MG tablet   Take 81 mg by mouth daily.      ibuprofen 200 MG tablet   Commonly known as: ADVIL,MOTRIN   Take 200 mg by mouth every 6 (six) hours as needed.      methocarbamol 500 MG tablet   Commonly known as: ROBAXIN   Take 1 tablet (500 mg total) by mouth 4 (four) times daily.      mulitivitamin with minerals Tabs   Take 1 tablet by mouth daily.  oxyCODONE-acetaminophen 5-325 MG per tablet   Commonly known as: PERCOCET   Take 1-2 tablets by mouth every 4 (four) hours as needed for pain.      rivaroxaban 10 MG Tabs tablet   Commonly known as: XARELTO   Take 1 tablet (10 mg total) by mouth daily with breakfast.            Diagnostic Studies: Dg Chest 2 View  04/29/2011  *RADIOLOGY REPORT*  Clinical Data: Preop radiograph.  Smoker.  CHEST - 2 VIEW  Comparison: 06/28/2010  Findings: The heart size and mediastinal contours are within normal limits.  Both lungs are clear.  The visualized skeletal structures are unremarkable.  IMPRESSION: Negative exam.  Original Report Authenticated By: Rosealee Albee, M.D.   Dg Hip Complete Left  05/02/2011  *RADIOLOGY REPORT*  Clinical Data: Left total hip replacement.  LEFT HIP - COMPLETE 2+ VIEW  Comparison: None.  Findings: Changes of left hip replacement.  No hardware or bony complicating feature.  Normal AP alignment.  IMPRESSION: Left hip replacement.  No complicating  feature.  Original Report Authenticated By: Cyndie Chime, M.D.   Dg Pelvis Portable  05/02/2011  *RADIOLOGY REPORT*  Clinical Data: As post left-sided hip replacement.  PORTABLE PELVIS  Comparison: None.  Findings: Left-sided total hip arthroplasty is noted.  Both the femoral and acetabular component appear well seated without obvious periprosthetic fracture or other immediate complication. Prosthetic femoral head projects over the prosthetic acetabulum.  There is extensive gas within the overlying soft tissues, and multiple surgical staples overlying the left hip.  Visualized bony pelvis appears intact, as does the right proximal femur.  IMPRESSION:  1.  Status post left total hip arthroplasty, with expected postoperative appearance, as above.  No immediate complicating features noted.  Original Report Authenticated By: Florencia Reasons, M.D.   Dg Hip Portable 1 View Left  05/02/2011  *RADIOLOGY REPORT*  Clinical Data: Postop  PORTABLE LEFT HIP - 1 VIEW  Comparison: 05/02/2011  Findings: A single portable view of the left hip submitted.  Again noted a left hip prosthesis in anatomic alignment.  Postoperative changes are noted.  IMPRESSION: Left hip prosthesis in anatomic alignment.  Original Report Authenticated By: Natasha Mead, M.D.   Dg C-arm 61-120 Min-no Report  05/02/2011  CLINICAL DATA: hip film   C-ARM 61-120 MINUTES  Fluoroscopy was utilized by the requesting physician.  No radiographic  interpretation.      Disposition: Home or Self Care       Signed: Kathryne Hitch 05/05/2011, 7:11 AM

## 2011-05-05 NOTE — Evaluation (Signed)
Occupational Therapy Evaluation Patient Details Name: Katrina [name] MRN: [id number] DOB: [birth year] Today's Date: 05/05/2011 [id number] Problem List:  Patient Active Problem List  Diagnoses  . Degenerative arthritis of hip    Past Medical History:  Past Medical History  Diagnosis Date  . Asthma     hx of asthma 3/12- hospitalized   . Arthritis     knees, hips, elbows   . Depression     hx of depression    Past Surgical History:  Past Surgical History  Procedure Date  . Joint replacement   . Other surgical history     arthroswcopic surgery right knee   . Other surgical history     arthroscopic surgery left knee x 2   . Other surgical history     bunionectomy right foot   . Back surgery     x2  . Other surgical history     C Section x 2   . Cholecystectomy   . Other surgical history     carpal tunnel on left     OT Assessment/Plan/Recommendation OT Assessment Clinical Impression Statement: This 56 year old female was admitted for L anterior direct THA.  She is limited with ADLS secondary to pain but has assist at home from daughters. All education was completed, and                                              she does not need further OT.  She has a 3:1. OT Goals    OT Evaluation Precautions/Restrictions  Precautions Precaution Comments: None - direct anterior approach Restrictions Weight Bearing Restrictions: Yes LLE Weight Bearing: Weight bearing as tolerated Prior Functioning Home Living Receives Help From: Other (Comment) (daughters will stay and help her; was independent prior ) Bathroom Shower/Tub: Health visitor: Standard (has 3:1) Prior Function Level of Independence: Requires assistive device for independence ADL ADL Grooming: Simulated;Set up Where Assessed - Grooming: Sitting, chair;Supported Upper Body Bathing: Simulated;Set up Where Assessed - Upper Body Bathing: Sitting, chair;Supported Lower Body Bathing:  Simulated;Moderate assistance Where Assessed - Lower Body Bathing: Sit to stand from chair Upper Body Dressing: Simulated;Set up Where Assessed - Upper Body Dressing: Sitting, chair;Supported Lower Body Dressing: Simulated;Moderate assistance (with slip on shoes) Where Assessed - Lower Body Dressing: Sit to stand from chair Toilet Transfer: Performed;Minimal assistance (min guard) Toilet Transfer Method: Proofreader: Bedside commode Toileting - Clothing Manipulation: Simulated;Supervision/safety Where Assessed - Toileting Clothing Manipulation: Sit to stand from 3-in-1 or toilet Toileting - Hygiene: Performed;Set up Where Assessed - Toileting Hygiene: Sit on 3-in-1 or toilet Tub/Shower Transfer: Other (comment) (educated.  Walked to bathroom but her ledge is shorter) Equipment Used: Rolling walker (not interested in AE; has help) Ambulation Related to ADLs: supervision level ADL Comments: did not wish to perform any adl tasks Vision/Perception  Vision - History Patient Visual Report: No change from baseline Cognition Cognition Overall Cognitive Status: Appears within functional limits for tasks assessed Orientation Level: Oriented X4 Sensation/Coordination   Extremity Assessment RUE Assessment RUE Assessment: Within Functional Limits LUE Assessment LUE Assessment: Within Functional Limits Mobility  Bed Mobility Supine to Sit: With rails;4: Min assist Transfers Sit to Stand: 5: Supervision;With upper extremity assist Sit to Stand Details (indicate cue type and reason):  (min cues for hand placement) Exercises   End of Session OT -  End of Session Activity Tolerance: Patient tolerated treatment well Patient left: in chair;with call bell in reach General Behavior During Session: [facility] for tasks performed Cognition: [facility] for tasks performed [facility], OTR/L [phone] 05/05/2011  Katrina [name] 05/05/2011, 11:01 AM

## 2011-05-05 NOTE — Progress Notes (Signed)
Subjective: 3 Days Post-Op Procedure(s) (LRB): TOTAL HIP ARTHROPLASTY ANTERIOR APPROACH (Left) Patient reports pain as mild.    Objective: Vital signs in last 24 hours: Temp:  [97.9 F (36.6 C)-99.2 F (37.3 C)] 98.1 F (36.7 C) (01/28 0539) Pulse Rate:  [105-117] 105  (01/28 0539) Resp:  [16-18] 17  (01/28 0539) BP: (97-120)/(57-75) 97/62 mmHg (01/28 0539) SpO2:  [92 %-99 %] 92 % (01/28 0539)  Intake/Output from previous day: 01/27 0701 - 01/28 0700 In: 900 [I.V.:900] Out: 800 [Urine:800] Intake/Output this shift:     Basename 05/05/11 0426 05/04/11 0524 05/03/11 0404  HGB 8.6* 9.4* 10.2*    Basename 05/05/11 0426 05/04/11 0524  WBC 14.4* 16.4*  RBC 2.75* 3.06*  HCT 26.0* 29.4*  PLT 205 208    Basename 05/03/11 0404  NA 136  K 4.0  CL 104  CO2 27  BUN 9  CREATININE 0.72  GLUCOSE 110*  CALCIUM 8.3*   No results found for this basename: LABPT:2,INR:2 in the last 72 hours  Sensation intact distally Intact pulses distally Dorsiflexion/Plantar flexion intact Incision: scant drainage Compartment soft  Assessment/Plan: 3 Days Post-Op Procedure(s) (LRB): TOTAL HIP ARTHROPLASTY ANTERIOR APPROACH (Left) Discharge home with home health  Kathryne Hitch 05/05/2011, 7:07 AM

## 2011-05-06 ENCOUNTER — Encounter (HOSPITAL_COMMUNITY): Payer: Self-pay | Admitting: Orthopaedic Surgery

## 2011-11-12 ENCOUNTER — Other Ambulatory Visit: Payer: Self-pay | Admitting: Family Medicine

## 2011-11-12 DIAGNOSIS — Z1231 Encounter for screening mammogram for malignant neoplasm of breast: Secondary | ICD-10-CM

## 2011-12-22 ENCOUNTER — Ambulatory Visit
Admission: RE | Admit: 2011-12-22 | Discharge: 2011-12-22 | Disposition: A | Discharge status: Home / Self Care | Payer: Medicare Other | Source: Ambulatory Visit | Attending: Family Medicine | Admitting: Family Medicine

## 2011-12-22 DIAGNOSIS — Z1231 Encounter for screening mammogram for malignant neoplasm of breast: Secondary | ICD-10-CM

## 2012-11-24 ENCOUNTER — Encounter (HOSPITAL_COMMUNITY): Payer: Self-pay | Admitting: Pharmacy Technician

## 2012-11-24 ENCOUNTER — Other Ambulatory Visit (HOSPITAL_COMMUNITY): Payer: Self-pay | Admitting: Orthopaedic Surgery

## 2012-11-25 NOTE — Patient Instructions (Signed)
Katrina [name]  11/25/2012   Your procedure is scheduled on:  12/03/12               Surgery [id number]  Report to [facility] at     0530  AM.  Call this number if you have problems the morning of surgery: [phone]   Remember:   Do not eat food or drink liquids after midnight.   Take these medicines the morning of surgery with A SIP OF WATER:    Do not wear jewelry, make-up or nail polish.  Do not wear lotions, powders, or perfumes.   Do not shave 48 hours prior to surgery.   Do not bring valuables to the hospital.  Contacts, dentures or bridgework may not be worn into surgery.  Leave suitcase in the car. After surgery it may be brought to your room.  For patients admitted to the hospital, checkout time is 11:00 AM the day of  discharge.   SEE CHG INSTRUCTION SHEET    Please read over the following fact sheets that you were given: MRSA Information, coughing and deep breathing exercises, leg exercises, Blood Transfusion Fact sheet                Failure to comply with these instructions may result in cancellation of your surgery.                Patient Signature ____________________________              Nurse Signature _____________________________

## 2012-11-26 ENCOUNTER — Ambulatory Visit (HOSPITAL_COMMUNITY)
Admission: RE | Admit: 2012-11-26 | Discharge: 2012-11-26 | Disposition: A | Discharge status: Home / Self Care | Payer: Medicare Other | Source: Ambulatory Visit | Attending: Orthopaedic Surgery | Admitting: Orthopaedic Surgery

## 2012-11-26 ENCOUNTER — Encounter (HOSPITAL_COMMUNITY): Payer: Self-pay

## 2012-11-26 ENCOUNTER — Encounter (HOSPITAL_COMMUNITY)
Admission: RE | Admit: 2012-11-26 | Discharge: 2012-11-26 | Disposition: A | Discharge status: Home / Self Care | Payer: Medicare Other | Source: Ambulatory Visit | Attending: Orthopaedic Surgery | Admitting: Orthopaedic Surgery

## 2012-11-26 ENCOUNTER — Other Ambulatory Visit: Payer: Self-pay

## 2012-11-26 DIAGNOSIS — J45909 Unspecified asthma, uncomplicated: Secondary | ICD-10-CM | POA: Insufficient documentation

## 2012-11-26 DIAGNOSIS — R9431 Abnormal electrocardiogram [ECG] [EKG]: Secondary | ICD-10-CM | POA: Insufficient documentation

## 2012-11-26 DIAGNOSIS — F17200 Nicotine dependence, unspecified, uncomplicated: Secondary | ICD-10-CM | POA: Insufficient documentation

## 2012-11-26 DIAGNOSIS — M1710 Unilateral primary osteoarthritis, unspecified knee: Secondary | ICD-10-CM | POA: Insufficient documentation

## 2012-11-26 DIAGNOSIS — Z01818 Encounter for other preprocedural examination: Secondary | ICD-10-CM | POA: Insufficient documentation

## 2012-11-26 DIAGNOSIS — Z01810 Encounter for preprocedural cardiovascular examination: Secondary | ICD-10-CM | POA: Insufficient documentation

## 2012-11-26 DIAGNOSIS — Z01812 Encounter for preprocedural laboratory examination: Secondary | ICD-10-CM | POA: Insufficient documentation

## 2012-11-26 HISTORY — DX: Chronic obstructive pulmonary disease, unspecified: J44.9

## 2012-11-26 LAB — URINALYSIS, ROUTINE W REFLEX MICROSCOPIC
Glucose, UA: NEGATIVE mg/dL
Leukocytes, UA: NEGATIVE
Nitrite: NEGATIVE
Specific Gravity, Urine: 1.031 — ABNORMAL HIGH (ref 1.005–1.030)
pH: 5.5 (ref 5.0–8.0)

## 2012-11-26 LAB — PROTIME-INR
INR: 0.92 (ref 0.00–1.49)
Prothrombin Time: 12.2 seconds (ref 11.6–15.2)

## 2012-11-26 LAB — CBC
Hemoglobin: 12.5 g/dL (ref 12.0–15.0)
Platelets: 346 10*3/uL (ref 150–400)
RBC: 4.18 MIL/uL (ref 3.87–5.11)
WBC: 13 10*3/uL — ABNORMAL HIGH (ref 4.0–10.5)

## 2012-11-26 LAB — BASIC METABOLIC PANEL
Calcium: 9.4 mg/dL (ref 8.4–10.5)
GFR calc non Af Amer: >90 mL/min (ref >90)
Potassium: 4.2 mEq/L (ref 3.5–5.1)
Sodium: 139 mEq/L (ref 135–145)

## 2012-11-26 LAB — SURGICAL PCR SCREEN
MRSA, PCR: NEGATIVE
Staphylococcus aureus: NEGATIVE

## 2012-11-26 LAB — URINE MICROSCOPIC-ADD ON

## 2012-11-26 NOTE — Progress Notes (Signed)
Dr Tiburcio Pea ( PCP) 08/01/10 LOV note on chart

## 2012-11-26 NOTE — Progress Notes (Signed)
Urinalysis, micro results and CBC results faxed via EPIC to Dr Allie Bossier.

## 2012-12-03 ENCOUNTER — Encounter (HOSPITAL_COMMUNITY): Payer: Self-pay | Admitting: Anesthesiology

## 2012-12-03 ENCOUNTER — Encounter (HOSPITAL_COMMUNITY)
Admission: RE | Disposition: A | Discharge status: Home Health | Payer: Self-pay | Source: Ambulatory Visit | Attending: Orthopaedic Surgery

## 2012-12-03 ENCOUNTER — Encounter (HOSPITAL_COMMUNITY): Payer: Self-pay | Admitting: *Deleted

## 2012-12-03 ENCOUNTER — Inpatient Hospital Stay (HOSPITAL_COMMUNITY): Payer: Medicare Other | Admitting: Anesthesiology

## 2012-12-03 ENCOUNTER — Inpatient Hospital Stay (HOSPITAL_COMMUNITY)
Admission: RE | Admit: 2012-12-03 | Discharge: 2012-12-05 | DRG: 470 | Disposition: A | Discharge status: Home Health | Payer: Medicare Other | Source: Ambulatory Visit | Attending: Orthopaedic Surgery | Admitting: Orthopaedic Surgery

## 2012-12-03 ENCOUNTER — Inpatient Hospital Stay (HOSPITAL_COMMUNITY): Payer: Medicare Other

## 2012-12-03 DIAGNOSIS — Z96649 Presence of unspecified artificial hip joint: Secondary | ICD-10-CM

## 2012-12-03 DIAGNOSIS — J449 Chronic obstructive pulmonary disease, unspecified: Secondary | ICD-10-CM | POA: Diagnosis present

## 2012-12-03 DIAGNOSIS — M1710 Unilateral primary osteoarthritis, unspecified knee: Principal | ICD-10-CM | POA: Diagnosis present

## 2012-12-03 DIAGNOSIS — M1711 Unilateral primary osteoarthritis, right knee: Secondary | ICD-10-CM

## 2012-12-03 DIAGNOSIS — M169 Osteoarthritis of hip, unspecified: Secondary | ICD-10-CM

## 2012-12-03 DIAGNOSIS — J4489 Other specified chronic obstructive pulmonary disease: Secondary | ICD-10-CM | POA: Diagnosis present

## 2012-12-03 DIAGNOSIS — F17200 Nicotine dependence, unspecified, uncomplicated: Secondary | ICD-10-CM | POA: Diagnosis present

## 2012-12-03 HISTORY — PX: TOTAL KNEE ARTHROPLASTY: SHX125

## 2012-12-03 LAB — TYPE AND SCREEN: Antibody Screen: NEGATIVE

## 2012-12-03 SURGERY — ARTHROPLASTY, KNEE, TOTAL
Anesthesia: Spinal | Site: Knee | Laterality: Right | Wound class: Clean

## 2012-12-03 MED ORDER — POLYETHYLENE GLYCOL 3350 17 G PO PACK
17.0000 g | PACK | Freq: Every day | ORAL | Status: DC | PRN
  Administered 2012-12-04: 17 g via ORAL

## 2012-12-03 MED ORDER — CEFAZOLIN SODIUM 1-5 GM-% IV SOLN
1.0000 g | Freq: Four times a day (QID) | INTRAVENOUS | Status: AC
  Administered 2012-12-03 (×2): 1 g via INTRAVENOUS
  Filled 2012-12-03 (×2): qty 50

## 2012-12-03 MED ORDER — LACTATED RINGERS IV SOLN
INTRAVENOUS | Status: DC | PRN
  Administered 2012-12-03 (×3): via INTRAVENOUS

## 2012-12-03 MED ORDER — VITAMIN C 500 MG PO TABS
1000.0000 mg | ORAL_TABLET | Freq: Every day | ORAL | Status: DC
  Administered 2012-12-04 – 2012-12-05 (×2): 1000 mg via ORAL
  Filled 2012-12-03 (×3): qty 2

## 2012-12-03 MED ORDER — CELECOXIB 200 MG PO CAPS
200.0000 mg | ORAL_CAPSULE | Freq: Once | ORAL | Status: AC
  Administered 2012-12-03: 200 mg via ORAL
  Filled 2012-12-03: qty 1

## 2012-12-03 MED ORDER — METHOCARBAMOL 500 MG PO TABS
500.0000 mg | ORAL_TABLET | Freq: Four times a day (QID) | ORAL | Status: DC | PRN
  Administered 2012-12-03 – 2012-12-05 (×5): 500 mg via ORAL
  Filled 2012-12-03 (×6): qty 1

## 2012-12-03 MED ORDER — RIVAROXABAN 10 MG PO TABS
10.0000 mg | ORAL_TABLET | Freq: Every day | ORAL | Status: DC
  Administered 2012-12-04 – 2012-12-05 (×2): 10 mg via ORAL
  Filled 2012-12-03 (×3): qty 1

## 2012-12-03 MED ORDER — ACETAMINOPHEN 650 MG RE SUPP: 650.0000 mg | Freq: Four times a day (QID) | RECTAL | Status: DC | PRN

## 2012-12-03 MED ORDER — LIDOCAINE HCL (CARDIAC) 20 MG/ML IV SOLN
INTRAVENOUS | Status: DC | PRN
  Administered 2012-12-03: 100 mg via INTRAVENOUS

## 2012-12-03 MED ORDER — DIPHENHYDRAMINE HCL 12.5 MG/5ML PO ELIX
12.5000 mg | ORAL_SOLUTION | ORAL | Status: DC | PRN
  Administered 2012-12-04: 12.5 mg via ORAL
  Filled 2012-12-03: qty 5

## 2012-12-03 MED ORDER — ADULT MULTIVITAMIN W/MINERALS CH
1.0000 | ORAL_TABLET | Freq: Every day | ORAL | Status: DC
  Administered 2012-12-04 – 2012-12-05 (×2): 1 via ORAL
  Filled 2012-12-03 (×3): qty 1

## 2012-12-03 MED ORDER — SUCCINYLCHOLINE CHLORIDE 20 MG/ML IJ SOLN
INTRAMUSCULAR | Status: DC | PRN
  Administered 2012-12-03: 100 mg via INTRAVENOUS

## 2012-12-03 MED ORDER — SODIUM CHLORIDE 0.9 % IV SOLN
INTRAVENOUS | Status: DC
  Administered 2012-12-03 – 2012-12-04 (×2): via INTRAVENOUS

## 2012-12-03 MED ORDER — NICOTINE 7 MG/24HR TD PT24
7.0000 mg | MEDICATED_PATCH | Freq: Every day | TRANSDERMAL | Status: DC
  Administered 2012-12-03: 7 mg via TRANSDERMAL
  Filled 2012-12-03 (×2): qty 1

## 2012-12-03 MED ORDER — FENTANYL CITRATE 0.05 MG/ML IJ SOLN
INTRAMUSCULAR | Status: DC | PRN
  Administered 2012-12-03: 100 ug via INTRAVENOUS

## 2012-12-03 MED ORDER — METOCLOPRAMIDE HCL 10 MG PO TABS: 5.0000 mg | ORAL_TABLET | Freq: Three times a day (TID) | ORAL | Status: DC | PRN

## 2012-12-03 MED ORDER — METOCLOPRAMIDE HCL 5 MG/ML IJ SOLN: 5.0000 mg | Freq: Three times a day (TID) | INTRAMUSCULAR | Status: DC | PRN

## 2012-12-03 MED ORDER — ONDANSETRON HCL 4 MG/2ML IJ SOLN
4.0000 mg | Freq: Four times a day (QID) | INTRAMUSCULAR | Status: DC | PRN
  Administered 2012-12-03: 4 mg via INTRAVENOUS
  Filled 2012-12-03: qty 2

## 2012-12-03 MED ORDER — HYDROMORPHONE HCL PF 1 MG/ML IJ SOLN
0.2500 mg | INTRAMUSCULAR | Status: DC | PRN
  Administered 2012-12-03 (×2): 0.5 mg via INTRAVENOUS
  Administered 2012-12-03: 0.25 mg via INTRAVENOUS
  Administered 2012-12-03: 0.5 mg via INTRAVENOUS
  Administered 2012-12-03: 0.25 mg via INTRAVENOUS

## 2012-12-03 MED ORDER — HYDROMORPHONE HCL PF 1 MG/ML IJ SOLN
INTRAMUSCULAR | Status: DC | PRN
  Administered 2012-12-03 (×2): 1 mg via INTRAVENOUS

## 2012-12-03 MED ORDER — CEFAZOLIN SODIUM-DEXTROSE 2-3 GM-% IV SOLR
2.0000 g | INTRAVENOUS | Status: AC
  Administered 2012-12-03: 2 g via INTRAVENOUS

## 2012-12-03 MED ORDER — MENTHOL 3 MG MT LOZG: 1.0000 | LOZENGE | OROMUCOSAL | Status: DC | PRN

## 2012-12-03 MED ORDER — PROPOFOL 10 MG/ML IV BOLUS
INTRAVENOUS | Status: DC | PRN
  Administered 2012-12-03: 200 mg via INTRAVENOUS

## 2012-12-03 MED ORDER — PHENOL 1.4 % MT LIQD: 1.0000 | OROMUCOSAL | Status: DC | PRN

## 2012-12-03 MED ORDER — DOCUSATE SODIUM 100 MG PO CAPS
100.0000 mg | ORAL_CAPSULE | Freq: Two times a day (BID) | ORAL | Status: DC
  Administered 2012-12-03 – 2012-12-05 (×4): 100 mg via ORAL

## 2012-12-03 MED ORDER — MEPERIDINE HCL 50 MG/ML IJ SOLN: 6.2500 mg | INTRAMUSCULAR | Status: DC | PRN

## 2012-12-03 MED ORDER — METHOCARBAMOL 100 MG/ML IJ SOLN
500.0000 mg | Freq: Four times a day (QID) | INTRAVENOUS | Status: DC | PRN
  Administered 2012-12-03: 500 mg via INTRAVENOUS
  Filled 2012-12-03: qty 5

## 2012-12-03 MED ORDER — VITAMIN B-6 100 MG PO TABS
100.0000 mg | ORAL_TABLET | Freq: Every day | ORAL | Status: DC
  Administered 2012-12-04 – 2012-12-05 (×2): 100 mg via ORAL
  Filled 2012-12-03 (×3): qty 1

## 2012-12-03 MED ORDER — ZOLPIDEM TARTRATE 5 MG PO TABS: 5.0000 mg | ORAL_TABLET | Freq: Every evening | ORAL | Status: DC | PRN

## 2012-12-03 MED ORDER — HYDROMORPHONE HCL PF 1 MG/ML IJ SOLN
INTRAMUSCULAR | Status: AC
  Filled 2012-12-03: qty 1

## 2012-12-03 MED ORDER — PROMETHAZINE HCL 25 MG/ML IJ SOLN: 6.2500 mg | INTRAMUSCULAR | Status: DC | PRN

## 2012-12-03 MED ORDER — MIDAZOLAM HCL 5 MG/5ML IJ SOLN
INTRAMUSCULAR | Status: DC | PRN
  Administered 2012-12-03: 2 mg via INTRAVENOUS

## 2012-12-03 MED ORDER — ALUM & MAG HYDROXIDE-SIMETH 200-200-20 MG/5ML PO SUSP: 30.0000 mL | ORAL | Status: DC | PRN

## 2012-12-03 MED ORDER — ONDANSETRON HCL 4 MG/2ML IJ SOLN
INTRAMUSCULAR | Status: DC | PRN
  Administered 2012-12-03: 4 mg via INTRAVENOUS

## 2012-12-03 MED ORDER — HYDROMORPHONE HCL PF 1 MG/ML IJ SOLN
1.0000 mg | INTRAMUSCULAR | Status: DC | PRN
  Administered 2012-12-03 (×2): 1 mg via INTRAVENOUS
  Filled 2012-12-03 (×2): qty 1

## 2012-12-03 MED ORDER — ACETAMINOPHEN 325 MG PO TABS
650.0000 mg | ORAL_TABLET | Freq: Four times a day (QID) | ORAL | Status: DC | PRN
  Administered 2012-12-04: 650 mg via ORAL
  Filled 2012-12-03 (×2): qty 2

## 2012-12-03 MED ORDER — OXYCODONE HCL 5 MG PO TABS
5.0000 mg | ORAL_TABLET | ORAL | Status: DC | PRN
  Administered 2012-12-03 – 2012-12-05 (×10): 10 mg via ORAL
  Filled 2012-12-03 (×10): qty 2

## 2012-12-03 MED ORDER — SODIUM CHLORIDE 0.9 % IR SOLN
Status: DC | PRN
  Administered 2012-12-03: 1000 mL

## 2012-12-03 MED ORDER — BISACODYL 5 MG PO TBEC: 5.0000 mg | DELAYED_RELEASE_TABLET | Freq: Every day | ORAL | Status: DC | PRN

## 2012-12-03 MED ORDER — OXYCODONE HCL ER 20 MG PO T12A
20.0000 mg | EXTENDED_RELEASE_TABLET | Freq: Two times a day (BID) | ORAL | Status: DC
  Administered 2012-12-03 – 2012-12-05 (×4): 20 mg via ORAL
  Filled 2012-12-03 (×4): qty 1

## 2012-12-03 MED ORDER — VITAMIN C 500 MG PO TABS: 1000.0000 mg | ORAL_TABLET | Freq: Every day | ORAL | Status: DC

## 2012-12-03 MED ORDER — ONDANSETRON HCL 4 MG PO TABS: 4.0000 mg | ORAL_TABLET | Freq: Four times a day (QID) | ORAL | Status: DC | PRN

## 2012-12-03 MED ORDER — ACETAMINOPHEN 500 MG PO TABS
1000.0000 mg | ORAL_TABLET | Freq: Once | ORAL | Status: AC
  Administered 2012-12-03: 1000 mg via ORAL

## 2012-12-03 MED ORDER — DEXAMETHASONE SODIUM PHOSPHATE 10 MG/ML IJ SOLN
INTRAMUSCULAR | Status: DC | PRN
  Administered 2012-12-03: 10 mg via INTRAVENOUS

## 2012-12-03 SURGICAL SUPPLY — 62 items
ADH SKN CLS APL DERMABOND .7 (GAUZE/BANDAGES/DRESSINGS) ×1
BAG SPEC THK2 15X12 ZIP CLS (MISCELLANEOUS) ×1
BAG ZIPLOCK 12X15 (MISCELLANEOUS) ×2 IMPLANT
BANDAGE ELASTIC 6 VELCRO ST LF (GAUZE/BANDAGES/DRESSINGS) ×3 IMPLANT
BANDAGE ESMARK 6X9 LF (GAUZE/BANDAGES/DRESSINGS) ×1 IMPLANT
BLADE SAG 18X100X1.27 (BLADE) ×2 IMPLANT
BLADE SAW SGTL 13.0X1.19X90.0M (BLADE) IMPLANT
BNDG CMPR 9X6 STRL LF SNTH (GAUZE/BANDAGES/DRESSINGS) ×1
BNDG ESMARK 6X9 LF (GAUZE/BANDAGES/DRESSINGS) ×2
BOWL SMART MIX CTS (DISPOSABLE) ×2 IMPLANT
CEMENT BONE 1-PACK (Cement) ×4 IMPLANT
CLOTH BEACON ORANGE TIMEOUT ST (SAFETY) ×2 IMPLANT
CUFF TOURN SGL QUICK 34 (TOURNIQUET CUFF) ×2
CUFF TRNQT CYL 34X4X40X1 (TOURNIQUET CUFF) ×1 IMPLANT
DERMABOND ADVANCED (GAUZE/BANDAGES/DRESSINGS) ×1
DERMABOND ADVANCED .7 DNX12 (GAUZE/BANDAGES/DRESSINGS) ×1 IMPLANT
DRAPE EXTREMITY T 121X128X90 (DRAPE) ×2 IMPLANT
DRAPE LG THREE QUARTER DISP (DRAPES) IMPLANT
DRAPE POUCH INSTRU U-SHP 10X18 (DRAPES) ×2 IMPLANT
DRAPE U-SHAPE 47X51 STRL (DRAPES) ×2 IMPLANT
DRSG AQUACEL AG ADV 3.5X10 (GAUZE/BANDAGES/DRESSINGS) ×2 IMPLANT
DRSG PAD ABDOMINAL 8X10 ST (GAUZE/BANDAGES/DRESSINGS) ×1 IMPLANT
DRSG TEGADERM 4X4.75 (GAUZE/BANDAGES/DRESSINGS) ×2 IMPLANT
DURAPREP 26ML APPLICATOR (WOUND CARE) ×2 IMPLANT
ELECT REM PT RETURN 9FT ADLT (ELECTROSURGICAL) ×2
ELECTRODE REM PT RTRN 9FT ADLT (ELECTROSURGICAL) ×1 IMPLANT
EVACUATOR 1/8 PVC DRAIN (DRAIN) ×2 IMPLANT
FACESHIELD LNG OPTICON STERILE (SAFETY) ×10 IMPLANT
GAUZE SPONGE 2X2 8PLY STRL LF (GAUZE/BANDAGES/DRESSINGS) ×1 IMPLANT
GAUZE XEROFORM 5X9 LF (GAUZE/BANDAGES/DRESSINGS) ×1 IMPLANT
GLOVE BIO SURGEON STRL SZ7.5 (GLOVE) ×2 IMPLANT
GLOVE BIOGEL PI IND STRL 8 (GLOVE) ×2 IMPLANT
GLOVE BIOGEL PI INDICATOR 8 (GLOVE) ×2
GLOVE ECLIPSE 8.0 STRL XLNG CF (GLOVE) ×2 IMPLANT
GOWN STRL REIN XL XLG (GOWN DISPOSABLE) ×4 IMPLANT
HANDPIECE INTERPULSE COAX TIP (DISPOSABLE) ×2
IMMOBILIZER KNEE 20 (SOFTGOODS) ×2
IMMOBILIZER KNEE 20 THIGH 36 (SOFTGOODS) ×1 IMPLANT
KIT BASIN OR (CUSTOM PROCEDURE TRAY) ×2 IMPLANT
KNEE/VIT E POLY LINER LEVEL 1B ×1 IMPLANT
NS IRRIG 1000ML POUR BTL (IV SOLUTION) ×2 IMPLANT
PACK TOTAL JOINT (CUSTOM PROCEDURE TRAY) ×2 IMPLANT
PADDING CAST COTTON 6X4 STRL (CAST SUPPLIES) ×3 IMPLANT
POSITIONER SURGICAL ARM (MISCELLANEOUS) ×2 IMPLANT
SET HNDPC FAN SPRY TIP SCT (DISPOSABLE) ×1 IMPLANT
SET PAD KNEE POSITIONER (MISCELLANEOUS) ×2 IMPLANT
SPONGE GAUZE 2X2 STER 10/PKG (GAUZE/BANDAGES/DRESSINGS) ×1
SPONGE GAUZE 4X4 12PLY (GAUZE/BANDAGES/DRESSINGS) ×1 IMPLANT
STAPLER VISISTAT 35W (STAPLE) ×2 IMPLANT
SUCTION FRAZIER 12FR DISP (SUCTIONS) ×2 IMPLANT
SUT MNCRL AB 4-0 PS2 18 (SUTURE) ×2 IMPLANT
SUT VIC AB 0 CT1 27 (SUTURE) ×4
SUT VIC AB 0 CT1 27XBRD ANTBC (SUTURE) ×2 IMPLANT
SUT VIC AB 1 CT1 27 (SUTURE) ×6
SUT VIC AB 1 CT1 27XBRD ANTBC (SUTURE) ×3 IMPLANT
SUT VIC AB 2-0 CT1 27 (SUTURE) ×4
SUT VIC AB 2-0 CT1 TAPERPNT 27 (SUTURE) ×2 IMPLANT
TOWEL OR 17X26 10 PK STRL BLUE (TOWEL DISPOSABLE) ×4 IMPLANT
TOWEL OR NON WOVEN STRL DISP B (DISPOSABLE) ×2 IMPLANT
TRAY FOLEY CATH 14FRSI W/METER (CATHETERS) ×2 IMPLANT
WATER STERILE IRR 1500ML POUR (IV SOLUTION) ×2 IMPLANT
WRAP KNEE MAXI GEL POST OP (GAUZE/BANDAGES/DRESSINGS) ×2 IMPLANT

## 2012-12-03 NOTE — Care Management Note (Addendum)
    Page 1 of 2   12/05/2012     12:35:41 PM   CARE MANAGEMENT NOTE 12/05/2012  Patient:  Katrina [name], Katrina [name]   Account Number:  [id number]  Date Initiated:  12/03/2012  Documentation initiated by:  Colleen Can  Subjective/Objective Assessment:   dx rt total knee replacemnt     Action/Plan:   CM spoke with patient. Plans are for her to return to her home in [place] where 2 adults daughters will be her caregivers. She has crutches and cane. Wants to use [place] for [facility] services. PT/OT pending   Anticipated DC Date:  12/05/2012   Anticipated DC Plan:  HOME W HOME HEALTH SERVICES      DC Planning Services  CM consult      [facility] Choice  HOME HEALTH   Choice offered to / List presented to:  C-1 Patient   DME arranged  Levan Hurst      DME agency  [facility]     HH arranged  HH-2 PT      [facility] agency  [facility]   Status of service:  Completed, signed off Medicare Important Message given?   (If response is "NO", the following Medicare IM given date fields will be blank) Date Medicare IM given:   Date Additional Medicare IM given:    Discharge Disposition:  HOME W HOME HEALTH SERVICES  Per UR Regulation:  Reviewed for med. necessity/level of care/duration of stay  If discussed at Long Length of Stay Meetings, dates discussed:    Comments:  12/05/12 Katrina Granja RN,BSN NCM WEEKEND [phone] [facility] REP DONNA AWARE OF D/C TODAY,& HHPT ORDER.[facility] DME REP JERMAINE AWARE OF RW ORDER FOR HOME & D/C.NO FURTHER D/C NEEDS.  12/04/12 Katrina Newburg RN,BSN NCM WEEKEND [phone] POD#1 RTKA.NOTED PT-HH,RW.PATIENT DOES NOT HAVE A RW.TC [facility] DME REP,AWARE OF NEED FOR RW.[facility] FOLLOWING FOR HHPT.AWAIT FINAL HH/DME ORDERS.

## 2012-12-03 NOTE — Anesthesia Preprocedure Evaluation (Addendum)
Anesthesia Evaluation  Patient identified by MRN, date of birth, ID band Patient awake    Reviewed: Allergy & Precautions, H&P , NPO status , Patient's Chart, lab work & pertinent test results  Airway Mallampati: II TM Distance: >3 FB Neck ROM: Full    Dental  (+) Dental Advisory Given Poor dentition. Denies loose teeth.:   Pulmonary asthma , COPDCurrent Smoker,  breath sounds clear to auscultation  Pulmonary exam normal       Cardiovascular negative cardio ROS  Rhythm:Regular Rate:Normal     Neuro/Psych  Headaches, PSYCHIATRIC DISORDERS Depression    GI/Hepatic negative GI ROS, Neg liver ROS,   Endo/Other  negative endocrine ROS  Renal/GU negative Renal ROS     Musculoskeletal negative musculoskeletal ROS (+)   Abdominal (+) + obese,   Peds  Hematology negative hematology ROS (+)   Anesthesia Other Findings   Reproductive/Obstetrics                           Anesthesia Physical  Anesthesia Plan  ASA: III  Anesthesia Plan: General   Post-op Pain Management:    Induction: Intravenous  Airway Management Planned: Oral ETT  Additional Equipment:   Intra-op Plan:   Post-operative Plan: Extubation in OR  Informed Consent: I have reviewed the patients History and Physical, chart, labs and discussed the procedure including the risks, benefits and alternatives for the proposed anesthesia with the patient or authorized representative who has indicated his/her understanding and acceptance.   Dental advisory given  Plan Discussed with: CRNA  Anesthesia Plan Comments: (Bad headache today. Discussed r/b general versus spinal. Prefers general.)      Anesthesia Quick Evaluation

## 2012-12-03 NOTE — Brief Op Note (Signed)
12/03/2012  9:28 AM  PATIENT:  [name]  57 y.o. female  PRE-OPERATIVE DIAGNOSIS:  Severe osteoarthritis right knee  POST-OPERATIVE DIAGNOSIS:  Severe osteoarthritis right knee  PROCEDURE:  Procedure(s): RIGHT TOTAL KNEE ARTHROPLASTY (Right)  SURGEON:  Surgeon(s) and Role:    * Kathryne Hitch, MD - Primary  PHYSICIAN ASSISTANT: Rexene Edison, PA-C  ANESTHESIA:   general  EBL:  Total I/O In: 2000 [I.V.:2000] Out: 300 [Urine:300]  BLOOD ADMINISTERED:none  LOCAL MEDICATIONS USED:  NONE  SPECIMEN:  No Specimen  DISPOSITION OF SPECIMEN:  N/A  COUNTS:  YES  TOURNIQUET:   Total Tourniquet Time Documented: Thigh (Right) - 71 minutes Total: Thigh (Right) - 71 minutes   DICTATION: .Other Dictation: Dictation Number [id number]  PLAN OF CARE: Admit to inpatient   PATIENT DISPOSITION:  PACU - hemodynamically stable.   Delay start of Pharmacological VTE agent (>24hrs) due to surgical blood loss or risk of bleeding: no

## 2012-12-03 NOTE — Transfer of Care (Signed)
Immediate Anesthesia Transfer of Care Note  Patient: Katrina [name]  Procedure(s) Performed: Procedure(s): RIGHT TOTAL KNEE ARTHROPLASTY (Right)  Patient Location: PACU  Anesthesia Type:General  Level of Consciousness: sedated  Airway & Oxygen Therapy: Patient Spontanous Breathing and Patient connected to face mask oxygen  Post-op Assessment: Report given to PACU RN and Post -op Vital signs reviewed and stable  Post vital signs: Reviewed and stable  Complications: No apparent anesthesia complications

## 2012-12-03 NOTE — Plan of Care (Signed)
Problem: Consults Goal: Diagnosis- Total Joint Replacement Right total knee     

## 2012-12-03 NOTE — H&P (Signed)
TOTAL KNEE ADMISSION H&P  Patient is being admitted for right total knee arthroplasty.  Subjective:  Chief Complaint:right knee pain.  HPI: Katrina [name], 57 y.o. female, has a history of pain and functional disability in the right knee due to arthritis and has failed non-surgical conservative treatments for greater than 12 weeks to includeNSAID's and/or analgesics, corticosteriod injections, viscosupplementation injections, use of assistive devices, weight reduction as appropriate and activity modification.  Onset of symptoms was gradual, starting 5 years ago with gradually worsening course since that time. The patient noted prior procedures on the knee to include  arthroscopy on the right knee(s).  Patient currently rates pain in the right knee(s) at 10 out of 10 with activity. Patient has night pain, worsening of pain with activity and weight bearing, pain that interferes with activities of daily living, pain with passive range of motion, crepitus and joint swelling.  Patient has evidence of subchondral sclerosis, periarticular osteophytes and joint space narrowing by imaging studies. There is no active infection.  Patient Active Problem List   Diagnosis Date Noted  . Arthritis of right knee 12/03/2012  . Degenerative arthritis of hip 05/02/2011   Past Medical History  Diagnosis Date  . Asthma     hx of asthma 3/12- hospitalized   . Arthritis     knees, hips, elbows   . Headache(784.0)   . COPD (chronic obstructive pulmonary disease)     Past Surgical History  Procedure Laterality Date  . Joint replacement    . Other surgical history      arthroswcopic surgery right knee   . Other surgical history      arthroscopic surgery left knee x 2   . Other surgical history      bunionectomy right foot   . Other surgical history      C Section x 2   . Cholecystectomy    . Other surgical history      carpal tunnel on left   . Total hip arthroplasty  05/02/2011    Procedure: TOTAL HIP  ARTHROPLASTY ANTERIOR APPROACH;  Surgeon: Kathryne Hitch, MD;  Location: [facility] ORS;  Service: Orthopedics;  Laterality: Left;  Left Total Hip Arthroplasty, Direct Anterior Approach  . Back surgery      x2  . Left elbow surgery       Prescriptions prior to admission  Medication Sig Dispense Refill  . acetaminophen-codeine (TYLENOL #3) 300-30 MG per tablet Take 1 tablet by mouth every 4 (four) hours as needed for pain.      . promethazine (PHENERGAN) 25 MG tablet Take 25 mg by mouth every 8 (eight) hours as needed for nausea.      . Ascorbic Acid (VITAMIN C) 1000 MG tablet Take 1,000 mg by mouth daily.      Marland Kitchen aspirin EC 81 MG tablet Take 81 mg by mouth daily.      Marland Kitchen glucosamine-chondroitin 500-400 MG tablet Take 1 tablet by mouth daily.      . Multiple Vitamin (MULITIVITAMIN WITH MINERALS) TABS Take 1 tablet by mouth daily.      Marland Kitchen pyridOXINE (VITAMIN B-6) 100 MG tablet Take 100 mg by mouth daily.       Allergies  Allergen Reactions  . Codeine Itching and Nausea Only    History  Substance Use Topics  . Smoking status: Current Every Day Smoker -- 1.00 packs/day for 24 years    Types: Cigarettes  . Smokeless tobacco: Never Used  . Alcohol Use: 0.0 oz/week  Comment: occasional beer     History reviewed. No pertinent family history.   Review of Systems  All other systems reviewed and are negative.    Objective:  Physical Exam  Constitutional: She is oriented to person, place, and time. She appears well-developed and well-nourished.  HENT:  Head: Normocephalic and atraumatic.  Eyes: EOM are normal. Pupils are equal, round, and reactive to light.  Neck: Normal range of motion. Neck supple.  Cardiovascular: Normal rate and regular rhythm.   Respiratory: Effort normal and breath sounds normal.  GI: Soft. Bowel sounds are normal.  Musculoskeletal:       Right knee: She exhibits decreased range of motion, swelling and effusion. Tenderness found. Medial joint line and lateral  joint line tenderness noted.  Neurological: She is alert and oriented to person, place, and time.  Skin: Skin is warm and dry.  Psychiatric: She has a normal mood and affect.    Vital signs in last 24 hours: Temp:  [97.4 F (36.3 C)] 97.4 F (36.3 C) (08/29 0537) Pulse Rate:  [108] 108 (08/29 0537) Resp:  [18] 18 (08/29 0537) BP: (152)/(90) 152/90 mmHg (08/29 0537) SpO2:  [97 %] 97 % (08/29 0537)  Labs:   Estimated body mass index is 35.31 kg/(m^2) as calculated from the following:   Height as of 11/26/12: 5\' 7"  (1.702 m).   Weight as of 04/29/11: 102.286 kg (225 lb 8 oz).   Imaging Review Plain radiographs demonstrate moderate degenerative joint disease of the right knee(s). The overall alignment ismild varus. The bone quality appears to be good for age and reported activity level.  Assessment/Plan:  End stage arthritis, right knee   The patient history, physical examination, clinical judgment of the provider and imaging studies are consistent with end stage degenerative joint disease of the right knee(s) and total knee arthroplasty is deemed medically necessary. The treatment options including medical management, injection therapy arthroscopy and arthroplasty were discussed at length. The risks and benefits of total knee arthroplasty were presented and reviewed. The risks due to aseptic loosening, infection, stiffness, patella tracking problems, thromboembolic complications and other imponderables were discussed. The patient acknowledged the explanation, agreed to proceed with the plan and consent was signed. Patient is being admitted for inpatient treatment for surgery, pain control, PT, OT, prophylactic antibiotics, VTE prophylaxis, progressive ambulation and ADL's and discharge planning. The patient is planning to be discharged home with home health services

## 2012-12-03 NOTE — Anesthesia Postprocedure Evaluation (Signed)
Anesthesia Post Note  Patient: Katrina [name]  Procedure(s) Performed: Procedure(s) (LRB): RIGHT TOTAL KNEE ARTHROPLASTY (Right)  Anesthesia type: General  Patient location: PACU  Post pain: Pain level controlled  Post assessment: Post-op Vital signs reviewed  Last Vitals: BP 136/66  Pulse 100  Temp(Src) 36.8 C (Oral)  Resp 16  Ht 5\' 7"  (1.702 m)  Wt 239 lb (108.41 kg)  BMI 37.42 kg/m2  SpO2 99%  Post vital signs: Reviewed  Level of consciousness: sedated  Complications: No apparent anesthesia complications

## 2012-12-03 NOTE — Evaluation (Signed)
Physical Therapy Evaluation Patient Details Name: Katrina [name] MRN: [id number] DOB: 05/09/1955 Today's Date: 12/03/2012 Time: 1478-2956 PT Time Calculation (min): 28 min  PT Assessment / Plan / Recommendation History of Present Illness  S/p RTKA 12/03/12  Clinical Impression  Pt tolerated ambulating in room. Emotional; about the pain not as bad as expected.pt will benefit from PT while in acute care. Pt will need a RW    PT Assessment  Patient needs continued PT services    Follow Up Recommendations  Home health PT    Does the patient have the potential to tolerate intense rehabilitation      Barriers to Discharge        Equipment Recommendations  Rolling walker with 5" wheels    Recommendations for Other Services     Frequency 7X/week    Precautions / Restrictions Precautions Precautions: Knee Required Braces or Orthoses: Knee Immobilizer - Right   Pertinent Vitals/Pain 6 R knee, premedicated.      Mobility  Bed Mobility Bed Mobility: Supine to Sit;Sit to Supine Supine to Sit: 4: Min assist;HOB elevated;With rails Sit to Supine: 4: Min assist;HOB flat;With rail Details for Bed Mobility Assistance: support of R leg Transfers Transfers: Sit to Stand;Stand to Sit Sit to Stand: 1: +2 Total assist;With upper extremity assist;From bed;From elevated surface Stand to Sit: To bed;With upper extremity assist Details for Transfer Assistance: cues for hand placement. Ambulation/Gait Ambulation/Gait Assistance: 1: +2 Total assist Ambulation/Gait: Patient Percentage: 80% Ambulation Distance (Feet): 20 Feet Assistive device: Rolling walker Ambulation/Gait Assistance Details: cues for sequence. Gait Pattern: Step-to pattern;Antalgic    Exercises     PT Diagnosis: Difficulty walking;Acute pain  PT Problem List: Decreased strength;Decreased activity tolerance;Decreased mobility;Pain;Decreased knowledge of precautions;Decreased safety awareness;Decreased knowledge of use  of DME PT Treatment Interventions: DME instruction;Gait training;Stair training;Functional mobility training;Therapeutic activities;Therapeutic exercise;Patient/family education     PT Goals(Current goals can be found in the care plan section) Acute Rehab PT Goals Patient Stated Goal: I want to walk without pain PT Goal Formulation: With patient Time For Goal Achievement: 12/10/12 Potential to Achieve Goals: Good  Visit Information  Last PT Received On: 12/03/12 Assistance Needed: +1 History of Present Illness: S/p RTKA 12/03/12       Prior Functioning  Home Living Family/patient expects to be discharged to:: Private residence Living Arrangements: Spouse/significant other Available Help at Discharge: Family Type of Home: House Home Access: Stairs to enter Secretary/administrator of Steps: 2 Home Layout: One level Home Equipment: Crutches;Cane - single point;Bedside commode Prior Function Level of Independence: Independent with assistive device(s) Communication Communication: No difficulties    Cognition  Cognition Arousal/Alertness: Awake/alert Behavior During Therapy: WFL for tasks assessed/performed Overall Cognitive Status: Within Functional Limits for tasks assessed    Extremity/Trunk Assessment Upper Extremity Assessment Upper Extremity Assessment: Overall WFL for tasks assessed Lower Extremity Assessment Lower Extremity Assessment: RLE deficits/detail RLE Deficits / Details: requires assistance to support leg to lower and raise.   Balance    End of Session PT - End of Session Activity Tolerance: Patient tolerated treatment well Patient left: in bed;with call bell/phone within reach Nurse Communication: Mobility status CPM Right Knee CPM Right Knee: On Right Knee Flexion (Degrees): 0 Right Knee Extension (Degrees): 60  GP     Rada Hay 12/03/2012, 4:47 PM Blanchard Kelch PT [phone]

## 2012-12-03 NOTE — Progress Notes (Signed)
Utilization review completed.  

## 2012-12-04 LAB — BASIC METABOLIC PANEL
CO2: 28 mEq/L (ref 19–32)
Calcium: 8.8 mg/dL (ref 8.4–10.5)
Chloride: 103 mEq/L (ref 96–112)
Potassium: 3.8 mEq/L (ref 3.5–5.1)
Sodium: 138 mEq/L (ref 135–145)

## 2012-12-04 LAB — CBC
Hemoglobin: 10.3 g/dL — ABNORMAL LOW (ref 12.0–15.0)
MCH: 29.3 pg (ref 26.0–34.0)
Platelets: 355 10*3/uL (ref 150–400)
RBC: 3.51 MIL/uL — ABNORMAL LOW (ref 3.87–5.11)
WBC: 21.5 10*3/uL — ABNORMAL HIGH (ref 4.0–10.5)

## 2012-12-04 MED ORDER — OXYCODONE-ACETAMINOPHEN 5-325 MG PO TABS: 1.0000 | ORAL_TABLET | ORAL | Status: DC | PRN

## 2012-12-04 MED ORDER — ASPIRIN EC 325 MG PO TBEC: 325.0000 mg | DELAYED_RELEASE_TABLET | Freq: Two times a day (BID) | ORAL | Status: DC

## 2012-12-04 MED ORDER — NICOTINE 14 MG/24HR TD PT24
14.0000 mg | MEDICATED_PATCH | Freq: Every day | TRANSDERMAL | Status: DC
  Administered 2012-12-04 – 2012-12-05 (×2): 14 mg via TRANSDERMAL
  Filled 2012-12-04 (×2): qty 1

## 2012-12-04 MED ORDER — METHOCARBAMOL 500 MG PO TABS: 500.0000 mg | ORAL_TABLET | Freq: Four times a day (QID) | ORAL | Status: DC | PRN

## 2012-12-04 NOTE — Progress Notes (Signed)
Physical Therapy Evaluation Patient Details Name: Katrina [name] MRN: [id number] DOB: 08/01/1955 Today's Date: 12/04/2012 Time: 0910-0956 PT Time Calculation (min): 46 min  PT Assessment / Plan / Recommendation History of Present Illness  S/p RTKA 12/03/12  Clinical Impression  POD # 1 am session.  Instructed pt on KI use for amb and applied.  Assisted Pt OOB to amb in hallway.  Pt required increased VC's to decrease anxiety.  Assisted back to bed to perform TE's following handout HEP.  Applied ICE and instructed on use. Pt plans to D/C to home with family.     PT Assessment       Follow Up Recommendations  Home health PT    Does the patient have the potential to tolerate intense rehabilitation      Barriers to Discharge        Equipment Recommendations  Rolling walker with 5" wheels    Recommendations for Other Services     Frequency      Precautions / Restrictions Precautions Precautions: Knee Precaution Comments: Instructed pt on KI use for amb Required Braces or Orthoses: Knee Immobilizer - Right Restrictions Weight Bearing Restrictions: No Other Position/Activity Restrictions: WBAT    Pertinent Vitals/Pain C/o 8/10 after session RN notfied for meds ICE applied      Mobility  Bed Mobility Bed Mobility: Supine to Sit;Sit to Supine Supine to Sit: 4: Min assist;HOB elevated;With rails Sit to Supine: 4: Min assist;HOB flat;With rail Details for Bed Mobility Assistance: support of R leg and increased time Transfers Transfers: Sit to Stand;Stand to Sit Sit to Stand: 4: Min assist;3: Mod assist;From bed Stand to Sit: 4: Min assist;3: Mod assist;To bed Details for Transfer Assistance: 50% VC's on proper tech and hand placement Ambulation/Gait Ambulation/Gait Assistance: 4: Min assist Ambulation Distance (Feet): 75 Feet Assistive device: Rolling walker Ambulation/Gait Assistance Details: increased time and 50% VC's on proper sequencing, proper walker to self  distance, upright posture and breathing tech to decrease HR and maintain RA sats. Gait Pattern: Step-to pattern;Antalgic Gait velocity: decreased   Total Knee Replacement TE's 10 reps B LE ankle pumps 10 reps knee presses 10 reps heel slides  10 reps SAQ's 10 reps SLR's 10 reps ABD Followed by ICE     PT Goals(Current goals can be found in the care plan section)    Visit Information  Last PT Received On: 12/04/12 Assistance Needed: +1 History of Present Illness: S/p RTKA 12/03/12       Prior Functioning       Cognition       Extremity/Trunk Assessment     Balance    End of Session PT - End of Session Equipment Utilized During Treatment: Gait belt;Right knee immobilizer Activity Tolerance: Patient tolerated treatment well Patient left: in bed;with call bell/phone within reach  Felecia Shelling  PTA [facility]  Acute  Rehab Pager      [phone]

## 2012-12-04 NOTE — Evaluation (Signed)
Occupational Therapy Evaluation Patient Details Name: MEMPHIS CRESWELL MRN: [id number] DOB: 1956/02/02 Today's Date: 12/04/2012 Time: 1478-2956 OT Time Calculation (min): 27 min  OT Assessment / Plan / Recommendation History of present illness S/p RTKA 12/03/12   Clinical Impression   Pt demos decline in function with ADLs and ADL mobility safety following R TKA. Pt would benefit from acute OT services to address impairments to help restore PLOF to return home safely    OT Assessment  Patient needs continued OT Services    Follow Up Recommendations  No OT follow up;Home health OT;Supervision/Assistance - 24 hour    Barriers to Discharge   None. Pt's spouse will be at home to provide assist  Equipment Recommendations  None recommended by OT    Recommendations for Other Services    Frequency  Min 2X/week    Precautions / Restrictions Precautions Precautions: Knee Precaution Comments: Instructed pt on KI use for amb Required Braces or Orthoses: Knee Immobilizer - Right Restrictions Weight Bearing Restrictions: No Other Position/Activity Restrictions: WBAT   Pertinent Vitals/Pain 5/10    ADL  Grooming: Performed;Wash/dry hands;Wash/dry face;Min guard Where Assessed - Grooming: Supported standing Upper Body Bathing: Simulated;Supervision/safety;Set up Lower Body Bathing: Moderate assistance;Maximal assistance;Simulated Upper Body Dressing: Performed;Supervision/safety;Set up Lower Body Dressing: Performed;Moderate assistance;Maximal assistance Toilet Transfer: Simulated;Minimal assistance Toilet Transfer Method: Sit to stand Toileting - Clothing Manipulation and Hygiene: Minimal assistance Where Assessed - Toileting Clothing Manipulation and Hygiene: Standing Tub/Shower Transfer Method: Not assessed ADL Comments: Pt familiar with ADL A/E and DME for previous hip surgery    OT Diagnosis: Generalized weakness;Acute pain  OT Problem List: Decreased strength;Decreased  activity tolerance;Pain;Impaired balance (sitting and/or standing) OT Treatment Interventions: Self-care/ADL training;Therapeutic exercise;Patient/family education;Neuromuscular education;Balance training;Therapeutic activities;DME and/or AE instruction   OT Goals(Current goals can be found in the care plan section) Acute Rehab OT Goals Patient Stated Goal: To return home OT Goal Formulation: With patient Time For Goal Achievement: 12/11/12 Potential to Achieve Goals: Good ADL Goals Pt Will Perform Grooming: with set-up;with supervision;standing Pt Will Perform Lower Body Bathing: with mod assist;with min assist;with adaptive equipment Pt Will Perform Lower Body Dressing: with mod assist;with min assist;sit to/from stand Pt Will Transfer to Toilet: with min guard assist Pt Will Perform Toileting - Clothing Manipulation and hygiene: with supervision;with min guard assist;sit to/from stand Pt Will Perform Tub/Shower Transfer: with min guard assist;shower seat  Visit Information  Last OT Received On: 12/04/12 Assistance Needed: +1 History of Present Illness: S/p RTKA 12/03/12       Prior Functioning     Home Living Family/patient expects to be discharged to:: Private residence Living Arrangements: Spouse/significant other Available Help at Discharge: Family Type of Home: House Home Access: Stairs to enter Secretary/administrator of Steps: 2 Entrance Stairs-Rails: None Home Layout: One level Home Equipment: Crutches;Cane - single point;Bedside commode;Adaptive equipment Adaptive Equipment: Reacher Prior Function Level of Independence: Independent with assistive device(s) Communication Communication: No difficulties Dominant Hand: Right         Vision/Perception Vision - History Baseline Vision: Wears glasses only for reading Patient Visual Report: No change from baseline Perception Perception: Within Functional Limits   Cognition  Cognition Arousal/Alertness:  Awake/alert Behavior During Therapy: WFL for tasks assessed/performed Overall Cognitive Status: Within Functional Limits for tasks assessed    Extremity/Trunk Assessment Upper Extremity Assessment Upper Extremity Assessment: Overall WFL for tasks assessed     Mobility Bed Mobility Bed Mobility: Supine to Sit;Sit to Supine Supine to Sit: 4: Min assist;HOB elevated;With rails Sit  to Supine: 4: Min assist;HOB flat;With rail Details for Bed Mobility Assistance: support of R leg and increased time Transfers Sit to Stand: 4: Min assist;3: Mod assist;From bed Stand to Sit: 4: Min assist;3: Mod assist;To bed Details for Transfer Assistance: 50% VC's on proper tech and hand placement     Exercise     Balance Balance Balance Assessed: Yes Dynamic Standing Balance Dynamic Standing - Balance Support: Left upper extremity supported;During functional activity;No upper extremity supported Dynamic Standing - Level of Assistance: 5: Stand by assistance;4: Min assist   End of Session OT - End of Session Equipment Utilized During Treatment: Gait belt;Rolling walker Activity Tolerance: Patient tolerated treatment well Patient left: in bed;with call bell/phone within reach CPM Right Knee CPM Right Knee: Off  GO     Margaretmary Eddy [facility] 12/04/2012, 3:00 PM

## 2012-12-04 NOTE — Op Note (Signed)
[name], TRAVAGLINI NO.:  [id number]  MEDICAL RECORD NO.:  [id number]  LOCATION:  1603                         FACILITY:  [facility]  PHYSICIAN:  Vanita Panda. Magnus Ivan, M.D.DATE OF BIRTH:  July 10, 1955  DATE OF PROCEDURE:  12/03/2012 DATE OF DISCHARGE:                              OPERATIVE REPORT   PREOPERATIVE DIAGNOSES:  Severe end-stage arthritis and degenerative joint disease, right knee.  POSTOPERATIVE DIAGNOSES:  Severe end-stage arthritis and degenerative joint disease, right knee.  PROCEDURE:  Right total knee arthroplasty.  IMPLANTS:  Stryker triathlon knee with size 4 femur, size 3 tibial tray, fixed bearing, 9 mm polyethylene insert, size 29 patellar button.  SURGEON:  Kathryne Hitch, MD.  ASSISTANT:  Richardean Canal, PA-C.  ANESTHESIA:  General.  ANTIBIOTICS:  2 g IV Ancef.  TOURNIQUET TIME:  70 minutes.  BLOOD LOSS:  Less than 200 mL.  COMPLICATIONS:  None.  INDICATIONS:  Ms. Katrina [name] is a 57 year old patient well known to me.  I have seen her for many years now for her right knee.  We performed arthroscopic surgery on this knee twice.  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  She has known wear and tear of the cartilage of her knee with failed multiple steroid injections, multiple aspirations, and even Synvisc injections.  She is to the point that due to her continued pain, swelling, and decreased mobility, she wished to proceed with a total knee arthroplasty.  The risks and benefits of this have been well explained to her in detail including the risk of acute blood loss anemia nerve and vessel injury, DVT, and PE, as well as infection.  The goals are to decrease pain and improve mobility.  Given the failure of conservative treatment, she now wishes to proceed with a total knee arthroplasty.  PROCEDURE DESCRIPTION:  After informed consent was obtained and appropriate, right knee was marked.  She was brought to the  operating room, placed supine on the operating table.  General anesthesia was then obtained.  A Foley catheter was placed and then her nonsterile tourniquet were placed around her upper right thigh.  Her right leg was then prepped and draped with DuraPrep and sterile drapes.  A time-out was called and she was identified as correct patient and correct right knee.  We then had the bed raise and an Esmarch was used to wrap out the right knee and the tourniquet was inflated to 300 mm of pressure.  We then made a midline incision along the right knee and carried this directly down to the patella.  We performed a medial parapatellar arthrotomy and found a large effusion from the knee.  Once we did open the knee and inverted the patella, we found significant osteophytes and debris in the knee.  We removed the remnants of the ACL, PCL, and medial and lateral meniscus.  We cleaned the knee of osteophytes and debris. We then started with our tibia using the external referencing guide for the tibia.  We then cut our tibial cutting block on and took a 9 mm off the high side.  We then turned around to the femur and placed the intramedullary guide for the femur.  Using  the [employer] system, we then took 8 mm off the femur.  We put an extension block on her knee and she actually had full extension.  We then placed our femoral sizing guide based on the epicondylar axis and chose a size 4 femur with a cutter size 4, 4 in 1 cutting block and made our anterior and posterior cuts, followed by our chamfer cuts.  We then made our femoral box cut. Attention was turned back to the tibia and I felt the size 3 tibia would be better fit on the tibial tray and on the plateau.  We placed a trial tibial tray and made our keel punch for this.  With the trial tibial tray and the trial femur, we tried a 9 mm polyethylene insert, and she had full extension and good range of motion, it felt stable.  We then cut the  patella taking 6 mm patella.  She had a very thin patella and drilled the holes for a size 29 patellar button.  With all trial implants in place, she had excellent range of motion of her knee, and I felt good about her stability.  We then removed all trial components and copiously irrigated the knee with normal saline solution using pulsatile lavage.  We let the tourniquet down.  Hemostasis was obtained with electrocautery.  She still had a decent amount of oozing from her synovitis.  We thoroughly irrigated the knee again and then we placed a medium Hemovac drain in the arthrotomy and closed the arthrotomy with interrupted #1 Vicryl suture, followed by 0 Vicryl in the deep tissue, 2- 0 Vicryl in the subcutaneous tissue, staples on the skin.  Xeroform and a well-padded sterile dressing was applied.  She was awakened, extubated, and taken to recovery room in stable condition.  All final counts were correct and no complications noted.  Of note, Richardean Canal, PA-C was present on the entire case and his presence was crucial for retracting up in position of the implants and closure of the wound.     Vanita Panda. Magnus Ivan, M.D.     CYB/MEDQ  D:  12/03/2012  T:  12/04/2012  Job:  [id number]

## 2012-12-04 NOTE — Progress Notes (Signed)
Physical Therapy Treatment Patient Details Name: Katrina [name] MRN: [id number] DOB: [birth year] Today's Date: 12/04/2012 Time: 1429-1500 PT Time Calculation (min): 31 min  PT Assessment / Plan / Recommendation  History of Present Illness S/p RTKA 12/03/12   PT Comments   POD # 1 PM session.  Assisted pt OOB to amb in hallway second time with increased distance.  Pt plans to D/C to home.  Will practice stairs tomorrow.   Follow Up Recommendations  Home health PT     Does the patient have the potential to tolerate intense rehabilitation     Barriers to Discharge        Equipment Recommendations  Rolling walker with 5" wheels    Recommendations for Other Services    Frequency 7X/week   Progress towards PT Goals Progress towards PT goals: Progressing toward goals  Plan      Precautions / Restrictions Precautions Precautions: Knee Precaution Comments: Instructed pt on KI use for amb Required Braces or Orthoses: Knee Immobilizer - Right Restrictions Weight Bearing Restrictions: No Other Position/Activity Restrictions: WBAT    Pertinent Vitals/Pain C/o 3/10 pain Pre medicated ICE applied    Mobility  Bed Mobility Bed Mobility: Supine to Sit;Sit to Supine Supine to Sit: 4: Min assist;HOB elevated;With rails Sit to Supine: 4: Min assist;HOB flat;With rail Details for Bed Mobility Assistance: support of R leg and increased time Transfers Transfers: Sit to Stand;Stand to Sit Sit to Stand: 4: Min assist;From bed Stand to Sit: 4: Min assist;To bed Details for Transfer Assistance: 25% VC's on safety Ambulation/Gait Ambulation/Gait Assistance: 4: Min assist Ambulation Distance (Feet): 95 Feet Assistive device: Rolling walker Ambulation/Gait Assistance Details: increased time and 255 VC's safety with turns and side stepping to [facility] Gait Pattern: Step-to pattern;Antalgic Gait velocity: decreased     PT Goals (current goals can now be found in the care plan  section) Acute Rehab PT Goals Patient Stated Goal: To return home  Visit Information  Last PT Received On: 12/04/12 Assistance Needed: +1 History of Present Illness: S/p RTKA 12/03/12    Subjective Data  Patient Stated Goal: To return home   Cognition  Cognition Arousal/Alertness: Awake/alert Behavior During Therapy: WFL for tasks assessed/performed Overall Cognitive Status: Within Functional Limits for tasks assessed    Balance  Balance Balance Assessed: Yes Dynamic Standing Balance Dynamic Standing - Balance Support: Left upper extremity supported;During functional activity;No upper extremity supported Dynamic Standing - Level of Assistance: 5: Stand by assistance;4: Min assist  End of Session PT - End of Session Equipment Utilized During Treatment: Gait belt;Right knee immobilizer Activity Tolerance: Patient tolerated treatment well Patient left: in bed;with call bell/phone within reach CPM Right Knee CPM Right Knee: Off   Felecia Shelling  PTA [facility]  Acute  Rehab Pager      [phone]

## 2012-12-04 NOTE — Progress Notes (Signed)
Subjective:  Patient reports pain as moderate. No events  Objective:   VITALS:   Filed Vitals:   12/04/12 0142 12/04/12 0322 12/04/12 0609 12/04/12 1007  BP: 128/81  136/82 131/79  Pulse: 81  78 98  Temp: 98.1 F (36.7 C)  97.6 F (36.4 C) 97.9 F (36.6 C)  TempSrc: Oral  Oral Oral  Resp: 18 16 16 16   Height:      Weight:      SpO2: 98%  100% 98%    Neurologically intact Neurovascular intact Sensation intact distally Intact pulses distally Dorsiflexion/Plantar flexion intact Incision: dressing C/D/I No cellulitis present Compartment soft   Lab Results  Component Value Date   WBC 21.5* 12/04/2012   HGB 10.3* 12/04/2012   HCT 32.0* 12/04/2012   MCV 91.2 12/04/2012   PLT 355 12/04/2012     Assessment/Plan: 1 Day Post-Op   Principal Problem:   Arthritis of right knee   Advance diet Up with therapy   Cheral Almas 12/04/2012, 10:21 AM

## 2012-12-05 LAB — CBC
HCT: 30.1 % — ABNORMAL LOW (ref 36.0–46.0)
Hemoglobin: 9.6 g/dL — ABNORMAL LOW (ref 12.0–15.0)
MCH: 29.4 pg (ref 26.0–34.0)
MCV: 92.3 fL (ref 78.0–100.0)
RBC: 3.26 MIL/uL — ABNORMAL LOW (ref 3.87–5.11)

## 2012-12-05 NOTE — Progress Notes (Signed)
Subjective:  Patient reports pain as mild.  No events  Objective:   VITALS:   Filed Vitals:   12/04/12 1353 12/04/12 1600 12/04/12 2100 12/05/12 0550  BP: 144/82  140/86 135/84  Pulse: 92  95 98  Temp: 97.7 F (36.5 C)  97.9 F (36.6 C) 98.2 F (36.8 C)  TempSrc: Oral  Oral Oral  Resp: 16 16 16 16   Height:      Weight:      SpO2: 100% 100% 96% 96%    Neurologically intact Neurovascular intact Sensation intact distally Intact pulses distally Dorsiflexion/Plantar flexion intact Incision: dressing C/D/I and no drainage No cellulitis present Compartment soft   Lab Results  Component Value Date   WBC 18.4* 12/05/2012   HGB 9.6* 12/05/2012   HCT 30.1* 12/05/2012   MCV 92.3 12/05/2012   PLT 360 12/05/2012     Assessment/Plan: 2 Days Post-Op   Principal Problem:   Arthritis of right knee   Advance diet Up with therapy DVT ppx Pain control Discharge planning Rx and DMEs scripts printed   Cheral Almas 12/05/2012, 8:20 AM

## 2012-12-05 NOTE — Discharge Summary (Signed)
Physician Discharge Summary  Patient ID: Katrina [name] MRN: [id number] DOB/AGE: 57-May-1957 57 y.o.  Admit date: 12/03/2012 Discharge date: 12/05/2012  Admission Diagnoses:  Arthritis of right knee  Discharge Diagnoses:  Principal Problem:   Arthritis of right knee   Past Medical History  Diagnosis Date  . Asthma     hx of asthma 3/12- hospitalized   . Arthritis     knees, hips, elbows   . Headache(784.0)   . COPD (chronic obstructive pulmonary disease)     Surgeries: Procedure(s): RIGHT TOTAL KNEE ARTHROPLASTY on 12/03/2012   Consultants (if any):    Discharged Condition: Improved  Hospital Course: Katrina [name] is an 57 y.o. female who was admitted 12/03/2012 with a diagnosis of Arthritis of right knee and went to the operating room on 12/03/2012 and underwent the above named procedures.    She was given perioperative antibiotics:  Anti-infectives   Start     Dose/Rate Route Frequency Ordered Stop   12/03/12 1400  ceFAZolin (ANCEF) IVPB 1 g/50 mL premix     1 g 100 mL/hr over 30 Minutes Intravenous Every 6 hours 12/03/12 1126 12/03/12 2025   12/03/12 0554  ceFAZolin (ANCEF) IVPB 2 g/50 mL premix     2 g 100 mL/hr over 30 Minutes Intravenous On call to O.R. 12/03/12 4098 12/03/12 1191    .  She was given sequential compression devices, early ambulation, and xarelto for DVT prophylaxis.  She benefited maximally from the hospital stay and there were no complications.    Recent vital signs:  Filed Vitals:   12/05/12 0550  BP: 135/84  Pulse: 98  Temp: 98.2 F (36.8 C)  Resp: 16    Recent laboratory studies:  Lab Results  Component Value Date   HGB 9.6* 12/05/2012   HGB 10.3* 12/04/2012   HGB 12.5 11/26/2012   Lab Results  Component Value Date   WBC 18.4* 12/05/2012   PLT 360 12/05/2012   Lab Results  Component Value Date   INR 0.92 11/26/2012   Lab Results  Component Value Date   NA 138 12/04/2012   K 3.8 12/04/2012   CL 103 12/04/2012   CO2 28  12/04/2012   BUN 7 12/04/2012   CREATININE 0.54 12/04/2012   GLUCOSE 162* 12/04/2012    Discharge Medications:     Medication List    STOP taking these medications       acetaminophen-codeine 300-30 MG per tablet  Commonly known as:  TYLENOL #3      TAKE these medications       aspirin EC 325 MG tablet  Take 1 tablet (325 mg total) by mouth 2 (two) times daily after a meal.     glucosamine-chondroitin 500-400 MG tablet  Take 1 tablet by mouth daily.     methocarbamol 500 MG tablet  Commonly known as:  ROBAXIN  Take 1 tablet (500 mg total) by mouth every 6 (six) hours as needed.     multivitamin with minerals Tabs tablet  Take 1 tablet by mouth daily.     oxyCODONE-acetaminophen 5-325 MG per tablet  Commonly known as:  ROXICET  Take 1-2 tablets by mouth every 4 (four) hours as needed for pain.     promethazine 25 MG tablet  Commonly known as:  PHENERGAN  Take 25 mg by mouth every 8 (eight) hours as needed for nausea.     pyridOXINE 100 MG tablet  Commonly known as:  VITAMIN B-6  Take 100 mg by  mouth daily.     vitamin C 1000 MG tablet  Take 1,000 mg by mouth daily.        Diagnostic Studies: Dg Chest 2 View  11/26/2012   *RADIOLOGY REPORT*  Clinical Data: Tobacco use, preop total knee replacement.  CHEST - 2 VIEW  Comparison: April 29, 2011.  Findings: Cardiomediastinal silhouette appears normal.  No acute pulmonary disease is noted.  Bony thorax is intact.  IMPRESSION: No acute cardiopulmonary abnormality seen.   Original Report Authenticated By: Lupita Raider.,  M.D.   Dg Knee Right Port  12/03/2012   *RADIOLOGY REPORT*  Clinical Data: Status post total right knee arthroplasty.  PORTABLE RIGHT KNEE - 1-2 VIEW  Comparison: October 31, 2010.  Findings: Status post right total knee arthroplasty.  The femoral and tibial components appear well situated.  Surgical drain is present anteriorly.  Soft tissue gas is noted consistent with postoperative status.  Midline surgical  staples are noted.  IMPRESSION: Status post right total knee arthroplasty.  Postsurgical changes are noted.   Original Report Authenticated By: Lupita Raider.,  M.D.    Disposition: 06-Home-Health Care Svc        Follow-up Information   Follow up with Kathryne Hitch, MD In 2 weeks.   Specialty:  Orthopedic Surgery   Contact information:   [address] [place] [address] [phone]        Signed: Cheral Almas 12/05/2012, 8:21 AM

## 2012-12-05 NOTE — Plan of Care (Signed)
Problem: Discharge Progression Outcomes Goal: Anticoagulant follow-up in place Outcome: Not Applicable Date Met:  12/05/12 asa

## 2012-12-05 NOTE — Progress Notes (Signed)
Occupational Therapy Treatment Patient Details Name: Katrina [name] MRN: [id number] DOB: Dec 26, 1955 Today's Date: 12/05/2012 Time: 4098-1191 OT Time Calculation (min): 24 min  OT Assessment / Plan / Recommendation  History of present illness S/p RTKA 12/03/12   OT comments    Follow Up Recommendations  No OT follow up;Supervision/Assistance - 24 hour    Barriers to Discharge       Equipment Recommendations  None recommended by OT    Recommendations for Other Services    Frequency     Progress towards OT Goals Progress towards OT goals: Progressing toward goals  Plan      Precautions / Restrictions Precautions Precautions: Knee Precaution Comments: Instructed pt on KI use for amb Required Braces or Orthoses: Knee Immobilizer - Right Restrictions Weight Bearing Restrictions: No   Pertinent Vitals/Pain 5/10 R knee; repositioned, ice just removed; pt requested pain meds    ADL  Toilet Transfer: Minimal assistance (to stand min guard for steps) Toilet Transfer Method: Sit to stand Toilet Transfer Equipment: Raised toilet seat with arms (or 3-in-1 over toilet) Toileting - Clothing Manipulation and Hygiene: Minimal assistance (for sit to stand; min guard standing for hygiene) Equipment Used: Rolling walker Transfers/Ambulation Related to ADLs: Pt ambulated to bathroom to use toilet with min guard, once she was standing.  Got up from flat bed to simulate home ADL Comments: Stood at sink and completed as much of bathing as she wanted to perform.  Her daughters arrived and she had me show them the reacher, which she plans to purchase.  Reviewed shower sequence as pt did not want to practice this.  Pt verbalizes understanding.      OT Diagnosis:    OT Problem List:   OT Treatment Interventions:     OT Goals(current goals can now be found in the care plan section)    Visit Information  Last OT Received On: 12/05/12 Assistance Needed: +1 History of Present Illness: S/p RTKA  12/03/12    Subjective Data      Prior Functioning       Cognition  Cognition Arousal/Alertness: Awake/alert Behavior During Therapy: WFL for tasks assessed/performed Overall Cognitive Status: Within Functional Limits for tasks assessed    Mobility  Bed Mobility Supine to Sit: 4: Min assist;HOB flat Details for Bed Mobility Assistance: assist for RLE.  Pt used arms with cues for bed mobility Transfers Sit to Stand: 4: Min assist;From bed;From chair/3-in-1;With upper extremity assist Stand to Sit: 4: Min assist;To bed;To chair/3-in-1 Details for Transfer Assistance: cues for RLE placement    Exercises      Balance     End of Session OT - End of Session Activity Tolerance: Patient tolerated treatment well Patient left: in bed;with call bell/phone within reach Nurse Communication: Patient requests pain meds CPM Right Knee CPM Right Knee: Off  GO     Renn Dirocco 12/05/2012, 9:34 AM Marica Otter, OTR/L [phone] 12/05/2012

## 2012-12-05 NOTE — Progress Notes (Signed)
Discharged from floor via w/c, family with pt. No changes in assessment. Katrina [name]  

## 2012-12-05 NOTE — Progress Notes (Signed)
Physical Therapy Treatment Patient Details Name: Katrina [name] MRN: [id number] DOB: [birth year] Today's Date: 12/05/2012 Time: 1010-1059 PT Time Calculation (min): 49 min  PT Assessment / Plan / Recommendation  History of Present Illness S/p RTKA 12/03/12   PT Comments   Pt doing much better today; Ok to D/C home from our standpoint; Pt has 2 daughters available to assist   Follow Up Recommendations  Home health PT     Does the patient have the potential to tolerate intense rehabilitation     Barriers to Discharge        Equipment Recommendations  Rolling walker with 5" wheels    Recommendations for Other Services    Frequency 7X/week   Progress towards PT Goals Progress towards PT goals: Progressing toward goals  Plan Current plan remains appropriate    Precautions / Restrictions Precautions Precautions: Knee Precaution Comments: Instructed pt on KI use for amb; instructed/reviewed not lying in bed with bent knee  Required Braces or Orthoses: Knee Immobilizer - Right Knee Immobilizer - Right: Discontinue once straight leg raise with < 10 degree lag Restrictions Weight Bearing Restrictions: No Other Position/Activity Restrictions: WBAT   Pertinent Vitals/Pain Pain ok, pt was premedicated; min c/o    Mobility  Bed Mobility Bed Mobility: Supine to Sit;Sit to Supine Supine to Sit: 4: Min assist;HOB flat Sit to Supine: 4: Min assist;HOB flat;With rail Details for Bed Mobility Assistance: assist for RLE.  Pt used arms with cues for bed mobility Transfers Transfers: Sit to Stand;Stand to Sit Sit to Stand: 5: Supervision;6: Modified independent (Device/Increase time) Stand to Sit: 5: Supervision;6: Modified independent (Device/Increase time) Details for Transfer Assistance: cues for RLE placement Ambulation/Gait Ambulation/Gait Assistance: 5: Supervision;6: Modified independent (Device/Increase time) Ambulation Distance (Feet): 160 Feet Assistive device: Rolling  walker Ambulation/Gait Assistance Details: initial cues for sequence Gait Pattern: Step-to pattern;Antalgic Gait velocity: decreased Stairs: Yes Stairs Assistance: 4: Min guard Stair Management Technique: Forwards;With walker (off set walker legs to make level for steps) Number of Stairs: 4    Exercises Total Joint Exercises Ankle Circles/Pumps: AROM;Both;10 reps Quad Sets: AROM;Both;10 reps Heel Slides: AROM;AAROM;Right;10 reps (self assisting with sheet) Hip ABduction/ADduction: AAROM;Right;10 reps Straight Leg Raises: AROM;Right;10 reps   PT Diagnosis:    PT Problem List:   PT Treatment Interventions:     PT Goals (current goals can now be found in the care plan section) Acute Rehab PT Goals Patient Stated Goal: i wnat to be able to do my exercises, decr pain Time For Goal Achievement: 12/10/12 Potential to Achieve Goals: Good  Visit Information  Last PT Received On: 12/05/12 Assistance Needed: +1 History of Present Illness: S/p RTKA 12/03/12    Subjective Data  Patient Stated Goal: i wnat to be able to do my exercises, decr pain   Cognition  Cognition Arousal/Alertness: Awake/alert Behavior During Therapy: WFL for tasks assessed/performed Overall Cognitive Status: Within Functional Limits for tasks assessed    Balance     End of Session PT - End of Session Equipment Utilized During Treatment: Gait belt;Right knee immobilizer Activity Tolerance: Patient tolerated treatment well Patient left: in bed;with call bell/phone within reach Nurse Communication: Mobility status CPM Right Knee CPM Right Knee: Off   GP     [facility] 12/05/2012, 11:15 AM

## 2012-12-08 ENCOUNTER — Encounter (HOSPITAL_COMMUNITY): Payer: Self-pay | Admitting: Orthopaedic Surgery

## 2013-04-07 DIAGNOSIS — J189 Pneumonia, unspecified organism: Secondary | ICD-10-CM

## 2013-04-07 HISTORY — DX: Pneumonia, unspecified organism: J18.9

## 2013-10-11 ENCOUNTER — Other Ambulatory Visit: Payer: Self-pay | Admitting: Orthopaedic Surgery

## 2013-10-11 DIAGNOSIS — M25562 Pain in left knee: Secondary | ICD-10-CM

## 2013-10-14 ENCOUNTER — Ambulatory Visit
Admission: RE | Admit: 2013-10-14 | Discharge: 2013-10-14 | Disposition: A | Discharge status: Home / Self Care | Payer: Medicare Other | Source: Ambulatory Visit | Attending: Orthopaedic Surgery | Admitting: Orthopaedic Surgery

## 2013-10-14 DIAGNOSIS — M25562 Pain in left knee: Secondary | ICD-10-CM

## 2013-10-17 ENCOUNTER — Other Ambulatory Visit (HOSPITAL_COMMUNITY): Payer: Self-pay | Admitting: Orthopaedic Surgery

## 2013-10-18 ENCOUNTER — Encounter (HOSPITAL_COMMUNITY): Payer: Self-pay | Admitting: Pharmacy Technician

## 2013-10-24 NOTE — Patient Instructions (Addendum)
Katrina [name]  10/24/2013   Your procedure is scheduled on:  11/03/2013  1030am-1125am  Report to [facility].  Follow the Signs to [place] at   0830     am  Call this number if you have problems the morning of surgery: [phone]   Remember:   Do not eat food or drink liquids after midnight.   Take these medicines the morning of surgery with A SIP OF WATER:    Do not wear jewelry, make-up or nail polish.  Do not wear lotions, powders, or perfumes. , deodorant   Do not shave 48 hours prior to surgery.  Do not bring valuables to the hospital.  Contacts, dentures or bridgework may not be worn into surgery.      Patients discharged the day of surgery will not be allowed to drive  home.  Name and phone number of your driver:   SEE CHG INSTRUCTION SHEET    Please read over the following fact sheets that you were given:[facility] - Preparing for Surgery Before surgery, you can play an important role.  Because skin is not sterile, your skin needs to be as free of germs as possible.  You can reduce the number of germs on your skin by washing with CHG (chlorahexidine gluconate) soap before surgery.  CHG is an antiseptic cleaner which kills germs and bonds with the skin to continue killing germs even after washing. Please DO NOT use if you have an allergy to CHG or antibacterial soaps.  If your skin becomes reddened/irritated stop using the CHG and inform your nurse when you arrive at Short Stay. Do not shave (including legs and underarms) for at least 48 hours prior to the first CHG shower.  You may shave your face/neck. Please follow these instructions carefully:  1.  Shower with CHG Soap the night before surgery and the  morning of Surgery.  2.  If you choose to wash your hair, wash your hair first as usual with your  normal  shampoo.  3.  After you shampoo, rinse your hair and body thoroughly to remove the  shampoo.                           4.  Use CHG as you would  any other liquid soap.  You can apply chg directly  to the skin and wash                       Gently with a scrungie or clean washcloth.  5.  Apply the CHG Soap to your body ONLY FROM THE NECK DOWN.   Do not use on face/ open                           Wound or open sores. Avoid contact with eyes, ears mouth and genitals (private parts).                       Wash face,  Genitals (private parts) with your normal soap.             6.  Wash thoroughly, paying special attention to the area where your surgery  will be performed.  7.  Thoroughly rinse your body with warm water from the neck down.  8.  DO NOT shower/wash with your normal soap after using and rinsing off  the CHG Soap.                9.  Pat yourself dry with a clean towel.            10.  Wear clean pajamas.            11.  Place clean sheets on your bed the night of your first shower and do not  sleep with pets. Day of Surgery : Do not apply any lotions/deodorants the morning of surgery.  Please wear clean clothes to the hospital/surgery center.  FAILURE TO FOLLOW THESE INSTRUCTIONS MAY RESULT IN THE CANCELLATION OF YOUR SURGERY PATIENT SIGNATURE_________________________________  NURSE SIGNATURE__________________________________  ________________________________________________________________________ , coughing and deep breathing exercises, leg exercises

## 2013-10-25 ENCOUNTER — Encounter (HOSPITAL_COMMUNITY)
Admission: RE | Admit: 2013-10-25 | Discharge: 2013-10-25 | Disposition: A | Discharge status: Home / Self Care | Payer: Medicare Other | Source: Ambulatory Visit | Attending: Orthopaedic Surgery | Admitting: Orthopaedic Surgery

## 2013-10-25 ENCOUNTER — Encounter (HOSPITAL_COMMUNITY): Payer: Self-pay

## 2013-10-25 ENCOUNTER — Encounter (INDEPENDENT_AMBULATORY_CARE_PROVIDER_SITE_OTHER): Payer: Self-pay

## 2013-10-25 DIAGNOSIS — M659 Unspecified synovitis and tenosynovitis, unspecified site: Secondary | ICD-10-CM | POA: Insufficient documentation

## 2013-10-25 DIAGNOSIS — M169 Osteoarthritis of hip, unspecified: Secondary | ICD-10-CM | POA: Insufficient documentation

## 2013-10-25 DIAGNOSIS — Z01812 Encounter for preprocedural laboratory examination: Secondary | ICD-10-CM | POA: Insufficient documentation

## 2013-10-25 DIAGNOSIS — M25469 Effusion, unspecified knee: Secondary | ICD-10-CM | POA: Insufficient documentation

## 2013-10-25 DIAGNOSIS — M1710 Unilateral primary osteoarthritis, unspecified knee: Secondary | ICD-10-CM | POA: Insufficient documentation

## 2013-10-25 DIAGNOSIS — Z01818 Encounter for other preprocedural examination: Secondary | ICD-10-CM | POA: Insufficient documentation

## 2013-10-25 DIAGNOSIS — J449 Chronic obstructive pulmonary disease, unspecified: Secondary | ICD-10-CM | POA: Insufficient documentation

## 2013-10-25 DIAGNOSIS — IMO0002 Reserved for concepts with insufficient information to code with codable children: Secondary | ICD-10-CM

## 2013-10-25 DIAGNOSIS — M1610 Unilateral primary osteoarthritis, unspecified hip: Secondary | ICD-10-CM | POA: Insufficient documentation

## 2013-10-25 DIAGNOSIS — J4489 Other specified chronic obstructive pulmonary disease: Secondary | ICD-10-CM | POA: Insufficient documentation

## 2013-10-25 LAB — CBC
HCT: 41.1 % (ref 36.0–46.0)
HEMOGLOBIN: 13.5 g/dL (ref 12.0–15.0)
MCH: 30.3 pg (ref 26.0–34.0)
MCHC: 32.8 g/dL (ref 30.0–36.0)
MCV: 92.2 fL (ref 78.0–100.0)
PLATELETS: 367 10*3/uL (ref 150–400)
RBC: 4.46 MIL/uL (ref 3.87–5.11)
RDW: 14 % (ref 11.5–15.5)
WBC: 12.7 10*3/uL — ABNORMAL HIGH (ref 4.0–10.5)

## 2013-10-25 NOTE — Progress Notes (Signed)
CBC faxed via EPIC to Dr Kathrynn Speed.

## 2013-11-03 ENCOUNTER — Encounter (HOSPITAL_COMMUNITY): Payer: Medicare Other | Admitting: Anesthesiology

## 2013-11-03 ENCOUNTER — Ambulatory Visit (HOSPITAL_COMMUNITY): Payer: Medicare Other | Admitting: Anesthesiology

## 2013-11-03 ENCOUNTER — Encounter (HOSPITAL_COMMUNITY): Payer: Self-pay | Admitting: *Deleted

## 2013-11-03 ENCOUNTER — Ambulatory Visit (HOSPITAL_COMMUNITY)
Admission: RE | Admit: 2013-11-03 | Discharge: 2013-11-03 | Disposition: A | Discharge status: Home / Self Care | Payer: Medicare Other | Source: Ambulatory Visit | Attending: Orthopaedic Surgery | Admitting: Orthopaedic Surgery

## 2013-11-03 ENCOUNTER — Encounter (HOSPITAL_COMMUNITY)
Admission: RE | Disposition: A | Discharge status: Home / Self Care | Payer: Self-pay | Source: Ambulatory Visit | Attending: Orthopaedic Surgery

## 2013-11-03 DIAGNOSIS — M2240 Chondromalacia patellae, unspecified knee: Secondary | ICD-10-CM | POA: Insufficient documentation

## 2013-11-03 DIAGNOSIS — E669 Obesity, unspecified: Secondary | ICD-10-CM | POA: Diagnosis not present

## 2013-11-03 DIAGNOSIS — Z6838 Body mass index (BMI) 38.0-38.9, adult: Secondary | ICD-10-CM | POA: Diagnosis not present

## 2013-11-03 DIAGNOSIS — F17200 Nicotine dependence, unspecified, uncomplicated: Secondary | ICD-10-CM | POA: Insufficient documentation

## 2013-11-03 DIAGNOSIS — F3289 Other specified depressive episodes: Secondary | ICD-10-CM | POA: Insufficient documentation

## 2013-11-03 DIAGNOSIS — J449 Chronic obstructive pulmonary disease, unspecified: Secondary | ICD-10-CM | POA: Diagnosis not present

## 2013-11-03 DIAGNOSIS — M6580 Other synovitis and tenosynovitis, unspecified site: Secondary | ICD-10-CM | POA: Diagnosis present

## 2013-11-03 DIAGNOSIS — F329 Major depressive disorder, single episode, unspecified: Secondary | ICD-10-CM | POA: Insufficient documentation

## 2013-11-03 DIAGNOSIS — M25462 Effusion, left knee: Secondary | ICD-10-CM

## 2013-11-03 DIAGNOSIS — J4489 Other specified chronic obstructive pulmonary disease: Secondary | ICD-10-CM | POA: Insufficient documentation

## 2013-11-03 HISTORY — PX: KNEE ARTHROSCOPY: SHX127

## 2013-11-03 SURGERY — ARTHROSCOPY, KNEE
Anesthesia: General | Laterality: Left

## 2013-11-03 MED ORDER — MEPERIDINE HCL 50 MG/ML IJ SOLN: 6.2500 mg | INTRAMUSCULAR | Status: DC | PRN

## 2013-11-03 MED ORDER — LIDOCAINE HCL (CARDIAC) 20 MG/ML IV SOLN
INTRAVENOUS | Status: AC
  Filled 2013-11-03: qty 5

## 2013-11-03 MED ORDER — ONDANSETRON HCL 4 MG/2ML IJ SOLN
INTRAMUSCULAR | Status: AC
  Filled 2013-11-03: qty 2

## 2013-11-03 MED ORDER — PROMETHAZINE HCL 25 MG/ML IJ SOLN
6.2500 mg | INTRAMUSCULAR | Status: AC | PRN
  Administered 2013-11-03 (×2): 6.25 mg via INTRAVENOUS

## 2013-11-03 MED ORDER — PROMETHAZINE HCL 25 MG/ML IJ SOLN
INTRAMUSCULAR | Status: AC
  Filled 2013-11-03: qty 1

## 2013-11-03 MED ORDER — OXYCODONE HCL 5 MG/5ML PO SOLN
5.0000 mg | Freq: Once | ORAL | Status: AC | PRN
Start: 2013-11-03 — End: 2013-11-03
  Filled 2013-11-03: qty 5

## 2013-11-03 MED ORDER — FENTANYL CITRATE 0.05 MG/ML IJ SOLN
INTRAMUSCULAR | Status: DC | PRN
  Administered 2013-11-03 (×2): 50 ug via INTRAVENOUS

## 2013-11-03 MED ORDER — PROMETHAZINE HCL 25 MG PO TABS: 25.0000 mg | ORAL_TABLET | Freq: Three times a day (TID) | ORAL | Status: DC | PRN

## 2013-11-03 MED ORDER — FENTANYL CITRATE 0.05 MG/ML IJ SOLN
INTRAMUSCULAR | Status: AC
  Filled 2013-11-03: qty 2

## 2013-11-03 MED ORDER — MIDAZOLAM HCL 2 MG/2ML IJ SOLN
INTRAMUSCULAR | Status: AC
  Filled 2013-11-03: qty 2

## 2013-11-03 MED ORDER — MIDAZOLAM HCL 5 MG/5ML IJ SOLN
INTRAMUSCULAR | Status: DC | PRN
  Administered 2013-11-03: 2 mg via INTRAVENOUS

## 2013-11-03 MED ORDER — PROPOFOL 10 MG/ML IV BOLUS
INTRAVENOUS | Status: AC
  Filled 2013-11-03: qty 20

## 2013-11-03 MED ORDER — DEXAMETHASONE SODIUM PHOSPHATE 10 MG/ML IJ SOLN
INTRAMUSCULAR | Status: DC | PRN
  Administered 2013-11-03: 10 mg via INTRAVENOUS

## 2013-11-03 MED ORDER — HYDROMORPHONE HCL PF 1 MG/ML IJ SOLN
INTRAMUSCULAR | Status: AC
  Filled 2013-11-03: qty 1

## 2013-11-03 MED ORDER — CEFAZOLIN SODIUM-DEXTROSE 2-3 GM-% IV SOLR
2.0000 g | INTRAVENOUS | Status: AC
  Administered 2013-11-03: 2 g via INTRAVENOUS

## 2013-11-03 MED ORDER — DEXAMETHASONE SODIUM PHOSPHATE 10 MG/ML IJ SOLN
INTRAMUSCULAR | Status: AC
  Filled 2013-11-03: qty 1

## 2013-11-03 MED ORDER — BUPIVACAINE HCL (PF) 0.5 % IJ SOLN
INTRAMUSCULAR | Status: AC
  Filled 2013-11-03: qty 30

## 2013-11-03 MED ORDER — LACTATED RINGERS IV SOLN
INTRAVENOUS | Status: DC
  Administered 2013-11-03: 1000 mL via INTRAVENOUS
  Administered 2013-11-03: 12:00:00 via INTRAVENOUS

## 2013-11-03 MED ORDER — MORPHINE SULFATE 4 MG/ML IJ SOLN
INTRAMUSCULAR | Status: DC | PRN
  Administered 2013-11-03: 4 mg

## 2013-11-03 MED ORDER — LIDOCAINE HCL (CARDIAC) 20 MG/ML IV SOLN
INTRAVENOUS | Status: DC | PRN
Start: 2013-11-03 — End: 2013-11-03
  Administered 2013-11-03: 50 mg via INTRAVENOUS

## 2013-11-03 MED ORDER — OXYCODONE HCL 5 MG PO TABS
5.0000 mg | ORAL_TABLET | Freq: Once | ORAL | Status: AC | PRN
  Administered 2013-11-03: 5 mg via ORAL
  Filled 2013-11-03: qty 1

## 2013-11-03 MED ORDER — CEFAZOLIN SODIUM-DEXTROSE 2-3 GM-% IV SOLR
INTRAVENOUS | Status: AC
  Filled 2013-11-03: qty 50

## 2013-11-03 MED ORDER — HYDROCODONE-ACETAMINOPHEN 5-325 MG PO TABS: 1.0000 | ORAL_TABLET | ORAL | Status: DC | PRN

## 2013-11-03 MED ORDER — BUPIVACAINE HCL (PF) 0.5 % IJ SOLN
INTRAMUSCULAR | Status: DC | PRN
Start: 2013-11-03 — End: 2013-11-03
  Administered 2013-11-03: 30 mL

## 2013-11-03 MED ORDER — MORPHINE SULFATE 4 MG/ML IJ SOLN
INTRAMUSCULAR | Status: AC
  Filled 2013-11-03: qty 1

## 2013-11-03 MED ORDER — ONDANSETRON HCL 4 MG/2ML IJ SOLN
INTRAMUSCULAR | Status: DC | PRN
  Administered 2013-11-03: 4 mg via INTRAVENOUS

## 2013-11-03 MED ORDER — HYDROMORPHONE HCL PF 1 MG/ML IJ SOLN
0.2500 mg | INTRAMUSCULAR | Status: DC | PRN
  Administered 2013-11-03 (×2): 0.5 mg via INTRAVENOUS

## 2013-11-03 MED ORDER — MEPERIDINE HCL 50 MG/ML IJ SOLN
INTRAMUSCULAR | Status: AC
  Filled 2013-11-03: qty 1

## 2013-11-03 SURGICAL SUPPLY — 28 items
BANDAGE ELASTIC 6 VELCRO ST LF (GAUZE/BANDAGES/DRESSINGS) ×1 IMPLANT
BLADE CUDA SHAVER 3.5 (BLADE) ×2 IMPLANT
BNDG CMPR 82X61 PLY HI ABS (GAUZE/BANDAGES/DRESSINGS) ×1
BNDG CONFORM 6X.82 1P STRL (GAUZE/BANDAGES/DRESSINGS) ×1 IMPLANT
COUNTER NEEDLE 20 DBL MAG RED (NEEDLE) ×2 IMPLANT
CUFF TOURN SGL QUICK 34 (TOURNIQUET CUFF) ×2
CUFF TRNQT CYL 34X4X40X1 (TOURNIQUET CUFF) ×1 IMPLANT
DRAPE U-SHAPE 47X51 STRL (DRAPES) ×2 IMPLANT
DURAPREP 26ML APPLICATOR (WOUND CARE) ×2 IMPLANT
GAUZE XEROFORM 1X8 LF (GAUZE/BANDAGES/DRESSINGS) ×2 IMPLANT
GLOVE BIO SURGEON STRL SZ7.5 (GLOVE) ×2 IMPLANT
GLOVE BIOGEL PI IND STRL 8 (GLOVE) ×1 IMPLANT
GLOVE BIOGEL PI INDICATOR 8 (GLOVE) ×1
GOWN STRL REUS W/TWL XL LVL3 (GOWN DISPOSABLE) ×2 IMPLANT
IV LACTATED RINGER IRRG 3000ML (IV SOLUTION) ×4
IV LR IRRIG 3000ML ARTHROMATIC (IV SOLUTION) ×2 IMPLANT
MANIFOLD NEPTUNE II (INSTRUMENTS) ×2 IMPLANT
PACK ARTHROSCOPY WL (CUSTOM PROCEDURE TRAY) ×2 IMPLANT
PADDING CAST COTTON 6X4 STRL (CAST SUPPLIES) ×2 IMPLANT
SET ARTHROSCOPY TUBING (MISCELLANEOUS) ×2
SET ARTHROSCOPY TUBING LN (MISCELLANEOUS) ×1 IMPLANT
SUT ETHILON 4 0 PS 2 18 (SUTURE) ×2 IMPLANT
SYR CONTROL 10ML LL (SYRINGE) ×2 IMPLANT
SYRINGE 20CC LL (MISCELLANEOUS) ×2 IMPLANT
TOWEL OR 17X26 10 PK STRL BLUE (TOWEL DISPOSABLE) ×2 IMPLANT
TUBING CONNECTING 10 (TUBING) IMPLANT
WAND 90 DEG TURBOVAC W/CORD (SURGICAL WAND) IMPLANT
WRAP KNEE MAXI GEL POST OP (GAUZE/BANDAGES/DRESSINGS) ×2 IMPLANT

## 2013-11-03 NOTE — Anesthesia Preprocedure Evaluation (Addendum)
Anesthesia Evaluation  Patient identified by MRN, date of birth, ID band Patient awake    Reviewed: Allergy & Precautions, H&P , NPO status , Patient's Chart, lab work & pertinent test results  Airway Mallampati: II TM Distance: >3 FB Neck ROM: Full    Dental  (+) Dental Advisory Given Poor dentition. Denies loose teeth.:   Pulmonary asthma , COPDCurrent Smoker,  breath sounds clear to auscultation  Pulmonary exam normal       Cardiovascular negative cardio ROS  Rhythm:Regular Rate:Normal     Neuro/Psych  Headaches, PSYCHIATRIC DISORDERS Depression    GI/Hepatic negative GI ROS, Neg liver ROS,   Endo/Other  negative endocrine ROS  Renal/GU negative Renal ROS     Musculoskeletal negative musculoskeletal ROS (+)   Abdominal (+) + obese,   Peds  Hematology negative hematology ROS (+)   Anesthesia Other Findings   Reproductive/Obstetrics                           Anesthesia Physical  Anesthesia Plan  ASA: III  Anesthesia Plan: General   Post-op Pain Management:    Induction: Intravenous  Airway Management Planned: LMA  Additional Equipment:   Intra-op Plan:   Post-operative Plan: Extubation in OR  Informed Consent: I have reviewed the patients History and Physical, chart, labs and discussed the procedure including the risks, benefits and alternatives for the proposed anesthesia with the patient or authorized representative who has indicated his/her understanding and acceptance.   Dental advisory given  Plan Discussed with: CRNA  Anesthesia Plan Comments:        Anesthesia Quick Evaluation

## 2013-11-03 NOTE — H&P (Signed)
Katrina [name] is an 58 y.o. female.   Chief Complaint:   recurrent effusions left knee HPI:   58 yo female well-known to me who we have drained numerous effusions off of her left knee.  We have also tried steroid injections and NSAIDs as well as elevation and ice.  A MRI shows a large effusion of uncertain etiology. At this point, we will proceed with a left knee arthroscopy given the failure of conservative treatment.  Past Medical History  Diagnosis Date  . Asthma     hx of asthma 3/12- hospitalized   . Arthritis     knees, hips, elbows   . Headache(784.0)   . COPD (chronic obstructive pulmonary disease)     Past Surgical History  Procedure Laterality Date  . Joint replacement    . Other surgical history      arthroswcopic surgery right knee   . Other surgical history      arthroscopic surgery left knee x 2   . Other surgical history      bunionectomy right foot   . Other surgical history      C Section x 2   . Cholecystectomy    . Other surgical history      carpal tunnel on left   . Total hip arthroplasty  05/02/2011    Procedure: TOTAL HIP ARTHROPLASTY ANTERIOR APPROACH;  Surgeon: Mcarthur Rossetti, MD;  Location: [facility];  Service: Orthopedics;  Laterality: Left;  Left Total Hip Arthroplasty, Direct Anterior Approach  . Back surgery      x2  . Left elbow surgery     . Total knee arthroplasty Right 12/03/2012    Procedure: RIGHT TOTAL KNEE ARTHROPLASTY;  Surgeon: Mcarthur Rossetti, MD;  Location: [facility];  Service: Orthopedics;  Laterality: Right;  . Right foot bunionectomy     . Knee arthroscopy      bilateral x 2   . Cesarean section      x 2  . Laparoscopy    . Carpal tunnel release      left   . Ulnar nerve transposition      left   . Upper gi endoscopy      x 2    History reviewed. No pertinent family history. Social History:  reports that she has been smoking Cigarettes.  She has a 25 pack-year smoking history. She has never used smokeless  tobacco. She reports that she drinks alcohol. She reports that she does not use illicit drugs.  Allergies:  Allergies  Allergen Reactions  . Codeine Itching and Nausea Only    Medications Prior to Admission  Medication Sig Dispense Refill  . acetaminophen-codeine (TYLENOL #3) 300-30 MG per tablet Take 2 tablets by mouth every 8 (eight) hours as needed for moderate pain.      Marland Kitchen aspirin 81 MG chewable tablet Chew 81 mg by mouth daily.      Marland Kitchen GLUCOSAMINE HCL PO Take 2,000 mg by mouth daily.      . Multiple Vitamin (MULITIVITAMIN WITH MINERALS) TABS Take 1 tablet by mouth daily.      . promethazine (PHENERGAN) 25 MG tablet Take 25 mg by mouth every 8 (eight) hours as needed for nausea.        No results found for this or any previous visit (from the past 48 hour(s)). No results found.  Review of Systems  Musculoskeletal: Positive for joint pain.  All other systems reviewed and are negative.   Blood pressure  173/78, pulse 92, temperature 97.8 F (36.6 C), temperature source Oral, resp. rate 18, height 5\' 7"  (1.702 m), weight 111.131 kg (245 lb), SpO2 98.00%. Physical Exam  Constitutional: She is oriented to person, place, and time. She appears well-developed and well-nourished.  HENT:  Head: Normocephalic and atraumatic.  Eyes: EOM are normal. Pupils are equal, round, and reactive to light.  Neck: Normal range of motion. Neck supple.  Cardiovascular: Normal rate and regular rhythm.   Respiratory: Effort normal and breath sounds normal.  GI: Soft. Bowel sounds are normal.  Musculoskeletal:       Left knee: She exhibits decreased range of motion and effusion.  Neurological: She is alert and oriented to person, place, and time.  Skin: Skin is warm and dry.  Psychiatric: She has a normal mood and affect.     Assessment/Plan Recurrent effusions of left knee of uncertain etiology. 1)  To the OR as an outpatient for a left knee diagnostic and hopefully therapeutic  arthroscopy.  BLACKMAN,CHRISTOPHER Y 11/03/2013, 8:46 AM

## 2013-11-03 NOTE — Discharge Instructions (Signed)
Increase your activities as comfort allows. You may put full weight as tolerated on your left knee. Ice and elevation for swelling. You can remove your dressings tomorrow 7/31 and get your incisions wet daily in the shower. Band-aids daily over your incisions.

## 2013-11-03 NOTE — Anesthesia Postprocedure Evaluation (Signed)
Anesthesia Post Note  Patient: Katrina [name]  Procedure(s) Performed: Procedure(s) (LRB): LEFT KNEE ARTHROSCOPY WITH DEBRIDEMENT, PARTIAL SYNOVECTOMY (Left)  Anesthesia type: General  Patient location: PACU  Post pain: Pain level controlled  Post assessment: Post-op Vital signs reviewed  Last Vitals: BP 152/71  Pulse 95  Temp(Src) 36.6 C (Oral)  Resp 18  Ht 5\' 7"  (1.702 m)  Wt 245 lb (111.131 kg)  BMI 38.36 kg/m2  SpO2 94%  Post vital signs: Reviewed  Level of consciousness: sedated  Complications: No apparent anesthesia complications

## 2013-11-03 NOTE — Op Note (Signed)
NAME:  Katrina [name], Katrina [name] NO.:  [id number]  MEDICAL RECORD NO.:  [id number]  LOCATION:  [facility]                         FACILITY:  [facility]  PHYSICIAN:  Lind Guest. Ninfa Linden, M.D.DATE OF BIRTH:  Oct 07, 1955  DATE OF PROCEDURE:  11/03/2013 DATE OF DISCHARGE:  11/03/2013                              OPERATIVE REPORT   PREOPERATIVE DIAGNOSES:  Left knee recurrent effusions with significant synovitis.  POSTOPERATIVE DIAGNOSES:  Left knee recurrent effusions with significant synovitis.  FINDINGS:  Extensive synovitis throughout the left knee with small area of grade 3 chondromalacia, lateral femoral condyle  PROCEDURE:  Left knee arthroscopy with debridement and partial synovectomy.  SURGEON:  Lind Guest. Ninfa Linden, M.D.  ASSISTANT:  Erskine Emery, PA-C  ANESTHESIA: 1. General. 2. Local with mixture of Marcaine and morphine.  BLOOD LOSS:  Minimal.  COMPLICATIONS:  None.  INDICATIONS:  Katrina [name] is a 58 year old female that I have known for many years now.  She has had significant left knee pain for some time now and has recurrent effusions that I have drained on numerous occasions of a large amount of fluid from her knee, but no evidence of infection.  We have placed steroids in it multiple times, she has been on anti-inflammatories, and her effusions keep recurring.  We sent her for an MRI of her knee and the MRI showed a large nonspecific effusion of uncertain etiology.  At this point, given recurrent effusions and draining multiple times, she has elected to proceed with an arthroscopic intervention, which will hopefully be a diagnostic and therapeutic arthroscopy with partial synovectomy.  She understands this and does wish to proceed with surgery due to the number of times we drained her knee and steroids have not been working.  PROCEDURE DESCRIPTION:  After informed consent was obtained, appropriate left knee was marked.  She was brought to the  operating room, placed supine on the operative table.  General anesthesia was then obtained. No tourniquet was placed around the leg.  The bed was raised and the left leg was prepped and draped with DuraPrep and sterile drapes including sterile stockinette.  A lateral leg post was utilized and the left knee was flexed off the side of the table.  A time-out was called and she was identified as correct patient, correct left knee, an anterolateral arthroscopy portal was then made.  We drained a large effusion from the knee, we went directly to the medial compartment and made an anteromedial incision.  Throughout the knee, there was red, inflamed synovium, but no evidence of pigmented villonodular synovitis, there was just generalized synovitis.  The medial compartment was pristine.  The ACL and PCL were intact and pristine.  The lateral meniscus showed just some degenerative tearing and fraying of the central aspect of the body, and there was one small area of near full- thickness cartilage loss in such a small area.  Even the intercondylar area had significant synovitis.  Using a soft tissue ablation wand, I performed a partial synovectomy in all 3 compartments especially the superior patellar area.  There was some chondromalacia in this area as well.  After I performed a partial synovectomy using the arthroscopic shaver and arthroscopic soft  tissue ablation wand, we allowed a lot of fluid to lavage the knee.  We then drained all fluid from the knee and closed the portal sites with interrupted nylon suture.  We inserted a mixture of morphine and Marcaine into the knee.  Well-padded sterile dressing was applied, and she was awakened, extubated, and taken to the recovery room in a stable condition.  All final counts were correct. There are no complications noted.     Lind Guest. Ninfa Linden, M.D.     CYB/MEDQ  D:  11/03/2013  T:  11/03/2013  Job:  [id number]

## 2013-11-03 NOTE — Brief Op Note (Signed)
11/03/2013  11:05 AM  PATIENT:  Katrina [name]  58 y.o. female  PRE-OPERATIVE DIAGNOSIS:  Recurrent effusions, synovitis left knee  POST-OPERATIVE DIAGNOSIS:  Recurrent effusions, synovitis left knee  PROCEDURE:  Procedure(s): LEFT KNEE ARTHROSCOPY WITH DEBRIDEMENT, PARTIAL SYNOVECTOMY (Left)  SURGEON:  Surgeon(s) and Role:    * Mcarthur Rossetti, MD - Primary  PHYSICIAN ASSISTANT: Benita Stabile, PA-C  ANESTHESIA:   local and general  EBL:   minimal  BLOOD ADMINISTERED:none  DRAINS: none   LOCAL MEDICATIONS USED:  MARCAINE     SPECIMEN:  No Specimen  DISPOSITION OF SPECIMEN:  N/A  COUNTS:  YES  TOURNIQUET:    DICTATION: .Other Dictation: Dictation Number [id number]  PLAN OF CARE: Discharge to home after PACU  PATIENT DISPOSITION:  PACU - hemodynamically stable.   Delay start of Pharmacological VTE agent (>24hrs) due to surgical blood loss or risk of bleeding: not applicable

## 2013-11-03 NOTE — Transfer of Care (Signed)
Immediate Anesthesia Transfer of Care Note  Patient: Katrina [name]  Procedure(s) Performed: Procedure(s): LEFT KNEE ARTHROSCOPY WITH DEBRIDEMENT, PARTIAL SYNOVECTOMY (Left)  Patient Location: PACU  Anesthesia Type:General  Level of Consciousness: awake, alert  and oriented  Airway & Oxygen Therapy: Patient Spontanous Breathing and Patient connected to face mask oxygen  Post-op Assessment: Report given to PACU RN and Post -op Vital signs reviewed and stable  Post vital signs: Reviewed and stable  Complications: No apparent anesthesia complications

## 2013-11-04 ENCOUNTER — Encounter (HOSPITAL_COMMUNITY): Payer: Self-pay | Admitting: Orthopaedic Surgery

## 2013-12-20 ENCOUNTER — Other Ambulatory Visit: Payer: Self-pay

## 2013-12-20 DIAGNOSIS — Z1231 Encounter for screening mammogram for malignant neoplasm of breast: Secondary | ICD-10-CM

## 2013-12-22 ENCOUNTER — Ambulatory Visit
Admission: RE | Admit: 2013-12-22 | Discharge: 2013-12-22 | Disposition: A | Discharge status: Home / Self Care | Payer: Medicare Other | Source: Ambulatory Visit

## 2013-12-22 DIAGNOSIS — Z1231 Encounter for screening mammogram for malignant neoplasm of breast: Secondary | ICD-10-CM

## 2014-04-21 ENCOUNTER — Emergency Department (HOSPITAL_COMMUNITY): Admission: EM | Admit: 2014-04-21 | Discharge: 2014-04-21 | Discharge status: Absent without leave | Payer: Self-pay

## 2014-04-21 ENCOUNTER — Inpatient Hospital Stay (HOSPITAL_COMMUNITY)
Admission: EM | Admit: 2014-04-21 | Discharge: 2014-04-23 | DRG: 603 | Disposition: A | Discharge status: Home / Self Care | Payer: Medicare Other | Attending: Internal Medicine | Admitting: Internal Medicine

## 2014-04-21 ENCOUNTER — Encounter (HOSPITAL_COMMUNITY): Payer: Self-pay | Admitting: *Deleted

## 2014-04-21 ENCOUNTER — Emergency Department (HOSPITAL_COMMUNITY): Payer: Medicare Other

## 2014-04-21 DIAGNOSIS — R51 Headache: Secondary | ICD-10-CM

## 2014-04-21 DIAGNOSIS — Z23 Encounter for immunization: Secondary | ICD-10-CM

## 2014-04-21 DIAGNOSIS — J449 Chronic obstructive pulmonary disease, unspecified: Secondary | ICD-10-CM | POA: Diagnosis present

## 2014-04-21 DIAGNOSIS — L03114 Cellulitis of left upper limb: Secondary | ICD-10-CM | POA: Diagnosis not present

## 2014-04-21 DIAGNOSIS — Z885 Allergy status to narcotic agent status: Secondary | ICD-10-CM

## 2014-04-21 DIAGNOSIS — Z720 Tobacco use: Secondary | ICD-10-CM | POA: Diagnosis present

## 2014-04-21 DIAGNOSIS — L03012 Cellulitis of left finger: Secondary | ICD-10-CM | POA: Diagnosis not present

## 2014-04-21 DIAGNOSIS — I10 Essential (primary) hypertension: Secondary | ICD-10-CM | POA: Diagnosis not present

## 2014-04-21 DIAGNOSIS — Z96642 Presence of left artificial hip joint: Secondary | ICD-10-CM | POA: Diagnosis present

## 2014-04-21 DIAGNOSIS — M79642 Pain in left hand: Secondary | ICD-10-CM | POA: Insufficient documentation

## 2014-04-21 DIAGNOSIS — T148 Other injury of unspecified body region: Secondary | ICD-10-CM | POA: Diagnosis present

## 2014-04-21 DIAGNOSIS — W5501XA Bitten by cat, initial encounter: Secondary | ICD-10-CM | POA: Diagnosis present

## 2014-04-21 DIAGNOSIS — Z96651 Presence of right artificial knee joint: Secondary | ICD-10-CM | POA: Diagnosis not present

## 2014-04-21 DIAGNOSIS — F17210 Nicotine dependence, cigarettes, uncomplicated: Secondary | ICD-10-CM | POA: Diagnosis not present

## 2014-04-21 DIAGNOSIS — Z7982 Long term (current) use of aspirin: Secondary | ICD-10-CM | POA: Diagnosis not present

## 2014-04-21 DIAGNOSIS — T148XXA Other injury of unspecified body region, initial encounter: Secondary | ICD-10-CM

## 2014-04-21 DIAGNOSIS — S61259A Open bite of unspecified finger without damage to nail, initial encounter: Secondary | ICD-10-CM | POA: Diagnosis not present

## 2014-04-21 DIAGNOSIS — J45909 Unspecified asthma, uncomplicated: Secondary | ICD-10-CM | POA: Insufficient documentation

## 2014-04-21 DIAGNOSIS — L03119 Cellulitis of unspecified part of limb: Secondary | ICD-10-CM | POA: Diagnosis present

## 2014-04-21 DIAGNOSIS — R519 Headache, unspecified: Secondary | ICD-10-CM

## 2014-04-21 LAB — COMPREHENSIVE METABOLIC PANEL
ALT: 23 U/L (ref 0–35)
AST: 22 U/L (ref 0–37)
Albumin: 3.8 g/dL (ref 3.5–5.2)
Alkaline Phosphatase: 111 U/L (ref 39–117)
Anion gap: 6 (ref 5–15)
BUN: 12 mg/dL (ref 6–23)
CO2: 28 mmol/L (ref 19–32)
Calcium: 9.1 mg/dL (ref 8.4–10.5)
Chloride: 105 mEq/L (ref 96–112)
Creatinine, Ser: 0.76 mg/dL (ref 0.50–1.10)
GFR calc Af Amer: >90 mL/min (ref >90)
Glucose, Bld: 106 mg/dL — ABNORMAL HIGH (ref 70–99)
POTASSIUM: 4.1 mmol/L (ref 3.5–5.1)
SODIUM: 139 mmol/L (ref 135–145)
Total Bilirubin: 0.4 mg/dL (ref 0.3–1.2)
Total Protein: 6.9 g/dL (ref 6.0–8.3)

## 2014-04-21 LAB — CBC WITH DIFFERENTIAL/PLATELET
Basophils Absolute: 0.1 10*3/uL (ref 0.0–0.1)
Basophils Relative: 1 % (ref 0–1)
Eosinophils Absolute: 0.4 10*3/uL (ref 0.0–0.7)
Eosinophils Relative: 4 % (ref 0–5)
HEMATOCRIT: 42.8 % (ref 36.0–46.0)
Hemoglobin: 14.3 g/dL (ref 12.0–15.0)
LYMPHS ABS: 4 10*3/uL (ref 0.7–4.0)
Lymphocytes Relative: 42 % (ref 12–46)
MCH: 32.1 pg (ref 26.0–34.0)
MCHC: 33.4 g/dL (ref 30.0–36.0)
MCV: 96.2 fL (ref 78.0–100.0)
MONOS PCT: 8 % (ref 3–12)
Monocytes Absolute: 0.7 10*3/uL (ref 0.1–1.0)
NEUTROS ABS: 4.5 10*3/uL (ref 1.7–7.7)
Neutrophils Relative %: 47 % (ref 43–77)
Platelets: 274 10*3/uL (ref 150–400)
RBC: 4.45 MIL/uL (ref 3.87–5.11)
RDW: 13.7 % (ref 11.5–15.5)
WBC: 9.7 10*3/uL (ref 4.0–10.5)

## 2014-04-21 MED ORDER — OXYCODONE-ACETAMINOPHEN 5-325 MG PO TABS
1.0000 | ORAL_TABLET | Freq: Once | ORAL | Status: AC
  Administered 2014-04-21: 1 via ORAL
  Filled 2014-04-21: qty 1

## 2014-04-21 MED ORDER — TETANUS-DIPHTH-ACELL PERTUSSIS 5-2.5-18.5 LF-MCG/0.5 IM SUSP
0.5000 mL | Freq: Once | INTRAMUSCULAR | Status: AC
  Administered 2014-04-21: 0.5 mL via INTRAMUSCULAR
  Filled 2014-04-21: qty 0.5

## 2014-04-21 MED ORDER — ONDANSETRON 4 MG PO TBDP
8.0000 mg | ORAL_TABLET | Freq: Once | ORAL | Status: AC
  Administered 2014-04-21: 8 mg via ORAL
  Filled 2014-04-21: qty 2

## 2014-04-21 MED ORDER — HYDROMORPHONE HCL 1 MG/ML IJ SOLN
1.0000 mg | Freq: Once | INTRAMUSCULAR | Status: AC
  Administered 2014-04-21: 1 mg via INTRAVENOUS
  Filled 2014-04-21: qty 1

## 2014-04-21 MED ORDER — AMPICILLIN-SULBACTAM SODIUM 3 (2-1) G IJ SOLR
3.0000 g | Freq: Once | INTRAMUSCULAR | Status: AC
  Administered 2014-04-21: 3 g via INTRAVENOUS
  Filled 2014-04-21: qty 3

## 2014-04-21 MED ORDER — ONDANSETRON HCL 4 MG/2ML IJ SOLN
4.0000 mg | Freq: Once | INTRAMUSCULAR | Status: AC
  Administered 2014-04-21: 4 mg via INTRAVENOUS
  Filled 2014-04-21: qty 2

## 2014-04-21 NOTE — ED Notes (Signed)
Paged Ortho  

## 2014-04-21 NOTE — ED Notes (Signed)
The pt was bitten by her own cat Wednesday to her lt hand and middle finger.  She has redness and swelling and she has nit been able to move her finger since the bite.Marland Kitchen

## 2014-04-21 NOTE — ED Notes (Signed)
Hand Surgeon at the bedside.

## 2014-04-21 NOTE — Progress Notes (Signed)
Orthopedic Tech Progress Note Patient Details:  Katrina [name] 07/17/1955 [id number]  Ortho Devices Type of Ortho Device: Ace wrap, Volar splint Ortho Device/Splint Location: LUE Ortho Device/Splint Interventions: Ordered, Application   Braulio Bosch 04/21/2014, 10:39 PM

## 2014-04-21 NOTE — ED Notes (Signed)
PA at the bedside.

## 2014-04-21 NOTE — ED Notes (Signed)
The pt gets nauseated with codeine.  She can take it just takes nausea med  With it

## 2014-04-21 NOTE — H&P (Addendum)
[facility] History and Physical  Katrina [name] HCW:[id number] DOB: 09-28-1955 DOA: 04/21/2014  Referring physician: ED physician PCP: Shirline Frees, MD  Specialists:   Chief Complaint: left hand pain and swelling after cat bite  HPI: Katrina [name] is a 59 y.o. female with PMH of COPD and arthritis, who presents with left hand pain and swelling after cat bite.  Patient reports that she started having left long finger swelling, redness, and pain after a cat bite sustained 2 days ago. Patient was bitten near the DIP joint. Pain is aggravated by movement of her finger. Pain is improved with elevation of her hand. No fever, chills. Tetanus is not up-to-date. Rabies vaccination of animal is UTD.  Patient denies fever, chills, fatigue, headaches, cough, chest pain, SOB, abdominal pain, diarrhea, constipation, dysuria, urgency, frequency, hematuria, skin rashes, joint pain or leg swelling.  Work up in the ED demonstrates negative x-ray of left hand for acute abnormalities. No leukocytosis. Body temperature normal. Patient is admitted to inpatient for further evaluation and treatment. Hand surgeon was consulted.  Review of Systems: As presented in the history of presenting illness, rest negative.  Where does patient live?  At home Can patient participate in ADLs? Yes  Allergy:  Allergies  Allergen Reactions  . Codeine Itching and Nausea Only    Past Medical History  Diagnosis Date  . Asthma     hx of asthma 3/12- hospitalized   . Arthritis     knees, hips, elbows   . Headache(784.0)   . COPD (chronic obstructive pulmonary disease)     Past Surgical History  Procedure Laterality Date  . Joint replacement    . Other surgical history      arthroswcopic surgery right knee   . Other surgical history      arthroscopic surgery left knee x 2   . Other surgical history      bunionectomy right foot   . Other surgical history      C Section x 2   . Cholecystectomy    .  Other surgical history      carpal tunnel on left   . Total hip arthroplasty  05/02/2011    Procedure: TOTAL HIP ARTHROPLASTY ANTERIOR APPROACH;  Surgeon: Mcarthur Rossetti, MD;  Location: [facility];  Service: Orthopedics;  Laterality: Left;  Left Total Hip Arthroplasty, Direct Anterior Approach  . Back surgery      x2  . Left elbow surgery     . Total knee arthroplasty Right 12/03/2012    Procedure: RIGHT TOTAL KNEE ARTHROPLASTY;  Surgeon: Mcarthur Rossetti, MD;  Location: [facility];  Service: Orthopedics;  Laterality: Right;  . Right foot bunionectomy     . Knee arthroscopy      bilateral x 2   . Cesarean section      x 2  . Laparoscopy    . Carpal tunnel release      left   . Ulnar nerve transposition      left   . Upper gi endoscopy      x 2  . Knee arthroscopy Left 11/03/2013    Procedure: LEFT KNEE ARTHROSCOPY WITH DEBRIDEMENT, PARTIAL SYNOVECTOMY;  Surgeon: Mcarthur Rossetti, MD;  Location: [facility];  Service: Orthopedics;  Laterality: Left;    Social History:  reports that she has been smoking Cigarettes.  She has a 25 pack-year smoking history. She has never used smokeless tobacco. She reports that she drinks alcohol. She reports that she does  not use illicit drugs.  Family History: No family history on file.   Prior to Admission medications   Medication Sig Start Date End Date Taking? Authorizing Provider  aspirin 81 MG chewable tablet Chew 81 mg by mouth daily.    Historical Provider, MD  GLUCOSAMINE HCL PO Take 2,000 mg by mouth daily.    Historical Provider, MD  HYDROcodone-acetaminophen (NORCO) 5-325 MG per tablet Take 1-2 tablets by mouth every 4 (four) hours as needed for severe pain. 11/03/13   Mcarthur Rossetti, MD  Multiple Vitamin (MULITIVITAMIN WITH MINERALS) TABS Take 1 tablet by mouth daily.    Historical Provider, MD  promethazine (PHENERGAN) 25 MG tablet Take 1 tablet (25 mg total) by mouth every 8 (eight) hours as needed for nausea. 11/03/13    Mcarthur Rossetti, MD    Physical Exam: Filed Vitals:   04/21/14 2100 04/21/14 2130 04/21/14 2200 04/21/14 2230  BP: 144/83  160/74 162/79  Pulse: 81 74 80 86  Temp:      Resp:      Height:      Weight:      SpO2: 97% 98% 94% 94%   General: Not in acute distress HEENT:       Eyes: PERRL, EOMI, no scleral icterus       ENT: No discharge from the ears and nose, no pharynx injection, no tonsillar enlargement.        Neck: No JVD, no bruit, no mass felt. Cardiac: S1/S2, RRR, No murmurs, No gallops or rubs Pulm: Good air movement bilaterally. Clear to auscultation bilaterally. No rales, wheezing, rhonchi or rubs. Abd: Soft, nondistended, nontender, no rebound pain, no organomegaly, BS present Ext: Decreased range of motion, tenderness and swelling in right hand. Swelling and redness of dorsal aspect of entire R long finger, with tenderness with passive extension. Redness extends just proximally to the MCP.   2+DP/PT pulse bilaterally Musculoskeletal: No joint deformities, erythema, or stiffness, ROM full Skin: No rashes.  Neuro: Alert and oriented X3, cranial nerves II-XII grossly intact, muscle strength 5/5 in all extremeties, sensation to light touch intact.  Psych: Patient is not psychotic, no suicidal or hemocidal ideation.  Labs on Admission:  Basic Metabolic Panel:  Recent Labs Lab 04/21/14 1847  NA 139  K 4.1  CL 105  CO2 28  GLUCOSE 106*  BUN 12  CREATININE 0.76  CALCIUM 9.1   Liver Function Tests:  Recent Labs Lab 04/21/14 1847  AST 22  ALT 23  ALKPHOS 111  BILITOT 0.4  PROT 6.9  ALBUMIN 3.8   No results for input(s): LIPASE, AMYLASE in the last 168 hours. No results for input(s): AMMONIA in the last 168 hours. CBC:  Recent Labs Lab 04/21/14 1847  WBC 9.7  NEUTROABS 4.5  HGB 14.3  HCT 42.8  MCV 96.2  PLT 274   Cardiac Enzymes: No results for input(s): CKTOTAL, CKMB, CKMBINDEX, TROPONINI in the last 168 hours.  BNP (last 3 results) No  results for input(s): PROBNP in the last 8760 hours. CBG: No results for input(s): GLUCAP in the last 168 hours.  Radiological Exams on Admission: Dg Hand Complete Left  04/21/2014   CLINICAL DATA:  CT bite 2 days ago.  Injury on left middle finger.  EXAM: LEFT HAND - COMPLETE 3+ VIEW  COMPARISON:  None.  FINDINGS: No acute bony abnormality. Specifically, no fracture, subluxation, or dislocation. Soft tissues are intact. No radiopaque foreign body.  IMPRESSION: No acute bony abnormality.   Electronically  Signed   By: Rolm Baptise M.D.   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 Currently patient is not septic. Hand surgeon was consulted, will see tomorrow. -will admit to med-surg bed -started IV Unasyn -follow up blood culture -pain control -follow up hand surgeon's recommendation -IVF: NS 100 cc/h -applied hand splint -Tdap given  Tobacco abuse: Half pack a day for more than 20 years. -Consulted about the importance of quitting smoking -Nicotine patch  COPD: Stable. Not on medications.  -observe closely.   DVT ppx: SQ Heparin        Code Status: Full code Family Communication: None at bed side.        Disposition Plan: Admit to inpatient   Date of Service 04/21/2014    Ivor Costa [facility] Pager [phone]  If 7PM-7AM, please contact night-coverage [email or web address] Password [facility] 04/21/2014, 11:17 PM

## 2014-04-21 NOTE — ED Notes (Signed)
Attempted report x1. 

## 2014-04-21 NOTE — ED Provider Notes (Signed)
CSN: [id number]     Arrival date & time 04/21/14  1759 History   First MD Initiated Contact with Patient 04/21/14 2053     Chief Complaint  Patient presents with  . Animal Bite     (Consider location/radiation/quality/duration/timing/severity/associated sxs/prior Treatment) HPI Comments: Patient presents with c/o left long finger swelling, redness, and pain starting yesterday after a cat bite sustained 2 days ago. Patient was bitten near the DIP joint. Pain is much worse with movement of her finger. Pain is improved with elevation of her hand. No fever, nausea or vomiting. Tetanus is not up-to-date. Rabies vaccination of animal is UTD.   Patient is a 59 y.o. female presenting with animal bite. The history is provided by the patient.  Animal Bite Associated symptoms: no numbness     Past Medical History  Diagnosis Date  . Asthma     hx of asthma 3/12- hospitalized   . Arthritis     knees, hips, elbows   . Headache(784.0)   . COPD (chronic obstructive pulmonary disease)    Past Surgical History  Procedure Laterality Date  . Joint replacement    . Other surgical history      arthroswcopic surgery right knee   . Other surgical history      arthroscopic surgery left knee x 2   . Other surgical history      bunionectomy right foot   . Other surgical history      C Section x 2   . Cholecystectomy    . Other surgical history      carpal tunnel on left   . Total hip arthroplasty  05/02/2011    Procedure: TOTAL HIP ARTHROPLASTY ANTERIOR APPROACH;  Surgeon: Mcarthur Rossetti, MD;  Location: [facility];  Service: Orthopedics;  Laterality: Left;  Left Total Hip Arthroplasty, Direct Anterior Approach  . Back surgery      x2  . Left elbow surgery     . Total knee arthroplasty Right 12/03/2012    Procedure: RIGHT TOTAL KNEE ARTHROPLASTY;  Surgeon: Mcarthur Rossetti, MD;  Location: [facility];  Service: Orthopedics;  Laterality: Right;  . Right foot bunionectomy     . Knee arthroscopy       bilateral x 2   . Cesarean section      x 2  . Laparoscopy    . Carpal tunnel release      left   . Ulnar nerve transposition      left   . Upper gi endoscopy      x 2  . Knee arthroscopy Left 11/03/2013    Procedure: LEFT KNEE ARTHROSCOPY WITH DEBRIDEMENT, PARTIAL SYNOVECTOMY;  Surgeon: Mcarthur Rossetti, MD;  Location: [facility];  Service: Orthopedics;  Laterality: Left;   No family history on file. History  Substance Use Topics  . Smoking status: Current Every Day Smoker -- 1.00 packs/day for 25 years    Types: Cigarettes  . Smokeless tobacco: Never Used  . Alcohol Use: 0.0 oz/week     Comment: occasional beer    OB History    No data available     Review of Systems  Constitutional: Negative for activity change.  HENT: Negative for rhinorrhea and sore throat.   Eyes: Negative for redness.  Respiratory: Negative for cough.   Cardiovascular: Negative for chest pain.  Gastrointestinal: Negative for nausea, vomiting, abdominal pain and diarrhea.  Genitourinary: Negative for dysuria.  Musculoskeletal: Positive for myalgias, joint swelling and arthralgias. Negative for back pain  and neck pain.  Skin: Positive for wound.  Neurological: Negative for weakness, numbness and headaches.      Allergies  Codeine  Home Medications   Prior to Admission medications   Medication Sig Start Date End Date Taking? Authorizing Provider  aspirin 81 MG chewable tablet Chew 81 mg by mouth daily.    Historical Provider, MD  GLUCOSAMINE HCL PO Take 2,000 mg by mouth daily.    Historical Provider, MD  HYDROcodone-acetaminophen (NORCO) 5-325 MG per tablet Take 1-2 tablets by mouth every 4 (four) hours as needed for severe pain. 11/03/13   Mcarthur Rossetti, MD  Multiple Vitamin (MULITIVITAMIN WITH MINERALS) TABS Take 1 tablet by mouth daily.    Historical Provider, MD  promethazine (PHENERGAN) 25 MG tablet Take 1 tablet (25 mg total) by mouth every 8 (eight) hours as needed for  nausea. 11/03/13   Mcarthur Rossetti, MD   BP 144/83 mmHg  Pulse 81  Temp(Src) 98 F (36.7 C)  Resp 17  Ht 5\' 7"  (1.702 m)  Wt 240 lb (108.863 kg)  BMI 37.58 kg/m2  SpO2 97%   Physical Exam  Constitutional: She appears well-developed and well-nourished.  HENT:  Head: Normocephalic and atraumatic.  Eyes: Pupils are equal, round, and reactive to light.  Neck: Normal range of motion. Neck supple.  Cardiovascular: Exam reveals no decreased pulses.   Musculoskeletal: She exhibits edema and tenderness.       Right wrist: Normal.       Right hand: She exhibits decreased range of motion, tenderness and swelling. She exhibits no bony tenderness. Decreased sensation noted.  Swelling and redness of dorsal aspect of entire R long finger. No ventral swelling or tenderness. There is some tenderness with passive extension. Redness extends just proximally to the MCP.   Neurological: She is alert. No sensory deficit.  Motor, sensation, and vascular distal to the injury is fully intact.   Skin: Skin is warm and dry.  Psychiatric: She has a normal mood and affect.  Nursing note and vitals reviewed.         ED Course  Procedures (including critical care time) Labs Review Labs Reviewed  COMPREHENSIVE METABOLIC PANEL - Abnormal; Notable for the following:    Glucose, Bld 106 (*)    All other components within normal limits  CBC WITH DIFFERENTIAL    Imaging Review Dg Hand Complete Left  04/21/2014   CLINICAL DATA:  CT bite 2 days ago.  Injury on left middle finger.  EXAM: LEFT HAND - COMPLETE 3+ VIEW  COMPARISON:  None.  FINDINGS: No acute bony abnormality. Specifically, no fracture, subluxation, or dislocation. Soft tissues are intact. No radiopaque foreign body.  IMPRESSION: No acute bony abnormality.   Electronically Signed   By: Rolm Baptise M.D.   On: 04/21/2014 19:33     EKG Interpretation None       9:12 PM Patient seen and examined. Work-up initiated. Medications ordered.    Vital signs reviewed and are as follows: BP 144/83 mmHg  Pulse 81  Temp(Src) 98 F (36.7 C)  Resp 17  Ht 5\' 7"  (1.702 m)  Wt 240 lb (108.863 kg)  BMI 37.58 kg/m2  SpO2 97%  9:52 PM Patient discussed with and seen by Dr. Mingo Amber. Will call hand for reccs. Anticipate admit for IV abx.   10:52 PM Spoke with Dr. Caralyn Guile. Reccs short arm splint with extension to fingertips + elevation. He will see in the morning.   Spoke with Dr. Blaine Hamper  who will see and admit.   MDM   Final diagnoses:  Cellulitis of finger of left hand   Patient with rapidly progressing cellulitis of finger today after cat bite 2 days ago. Feel patient will be best served by admission and close monitoring, IV abx over the next 24 hours. No immediate need for surgical intervention suspected.     Carlisle Cater, PA-C 04/21/14 2259  Evelina Bucy, MD 04/22/14 Dyann Kief

## 2014-04-22 ENCOUNTER — Inpatient Hospital Stay (HOSPITAL_COMMUNITY): Payer: Medicare Other

## 2014-04-22 ENCOUNTER — Observation Stay (HOSPITAL_COMMUNITY): Payer: Medicare Other

## 2014-04-22 DIAGNOSIS — J449 Chronic obstructive pulmonary disease, unspecified: Secondary | ICD-10-CM

## 2014-04-22 DIAGNOSIS — Z885 Allergy status to narcotic agent status: Secondary | ICD-10-CM | POA: Diagnosis not present

## 2014-04-22 DIAGNOSIS — Z96651 Presence of right artificial knee joint: Secondary | ICD-10-CM | POA: Diagnosis present

## 2014-04-22 DIAGNOSIS — W5501XA Bitten by cat, initial encounter: Secondary | ICD-10-CM | POA: Diagnosis present

## 2014-04-22 DIAGNOSIS — T148 Other injury of unspecified body region: Secondary | ICD-10-CM | POA: Diagnosis present

## 2014-04-22 DIAGNOSIS — Z720 Tobacco use: Secondary | ICD-10-CM

## 2014-04-22 DIAGNOSIS — Z23 Encounter for immunization: Secondary | ICD-10-CM | POA: Diagnosis not present

## 2014-04-22 DIAGNOSIS — R110 Nausea: Secondary | ICD-10-CM

## 2014-04-22 DIAGNOSIS — I10 Essential (primary) hypertension: Secondary | ICD-10-CM | POA: Diagnosis present

## 2014-04-22 DIAGNOSIS — F17210 Nicotine dependence, cigarettes, uncomplicated: Secondary | ICD-10-CM | POA: Diagnosis present

## 2014-04-22 DIAGNOSIS — L03114 Cellulitis of left upper limb: Secondary | ICD-10-CM | POA: Diagnosis present

## 2014-04-22 DIAGNOSIS — Z96642 Presence of left artificial hip joint: Secondary | ICD-10-CM | POA: Diagnosis present

## 2014-04-22 DIAGNOSIS — L03012 Cellulitis of left finger: Secondary | ICD-10-CM | POA: Diagnosis not present

## 2014-04-22 DIAGNOSIS — Z7982 Long term (current) use of aspirin: Secondary | ICD-10-CM | POA: Diagnosis not present

## 2014-04-22 LAB — COMPREHENSIVE METABOLIC PANEL
ALT: 20 U/L (ref 0–35)
AST: 22 U/L (ref 0–37)
Albumin: 3.4 g/dL — ABNORMAL LOW (ref 3.5–5.2)
Alkaline Phosphatase: 94 U/L (ref 39–117)
Anion gap: 7 (ref 5–15)
BILIRUBIN TOTAL: 0.5 mg/dL (ref 0.3–1.2)
BUN: 11 mg/dL (ref 6–23)
CO2: 24 mmol/L (ref 19–32)
Calcium: 8.6 mg/dL (ref 8.4–10.5)
Chloride: 111 mEq/L (ref 96–112)
Creatinine, Ser: 0.73 mg/dL (ref 0.50–1.10)
GFR calc non Af Amer: >90 mL/min (ref >90)
Glucose, Bld: 123 mg/dL — ABNORMAL HIGH (ref 70–99)
POTASSIUM: 3.7 mmol/L (ref 3.5–5.1)
Sodium: 142 mmol/L (ref 135–145)
Total Protein: 6 g/dL (ref 6.0–8.3)

## 2014-04-22 LAB — CBC
HEMATOCRIT: 39.4 % (ref 36.0–46.0)
Hemoglobin: 12.8 g/dL (ref 12.0–15.0)
MCH: 30.7 pg (ref 26.0–34.0)
MCHC: 32.5 g/dL (ref 30.0–36.0)
MCV: 94.5 fL (ref 78.0–100.0)
Platelets: 243 10*3/uL (ref 150–400)
RBC: 4.17 MIL/uL (ref 3.87–5.11)
RDW: 13.8 % (ref 11.5–15.5)
WBC: 9.9 10*3/uL (ref 4.0–10.5)

## 2014-04-22 LAB — PROTIME-INR
INR: 1.03 (ref 0.00–1.49)
Prothrombin Time: 13.6 seconds (ref 11.6–15.2)

## 2014-04-22 MED ORDER — NICOTINE 14 MG/24HR TD PT24
14.0000 mg | MEDICATED_PATCH | Freq: Every day | TRANSDERMAL | Status: DC
  Administered 2014-04-22 – 2014-04-23 (×2): 14 mg via TRANSDERMAL
  Filled 2014-04-22 (×2): qty 1

## 2014-04-22 MED ORDER — ONDANSETRON HCL 4 MG/2ML IJ SOLN
4.0000 mg | Freq: Three times a day (TID) | INTRAMUSCULAR | Status: AC | PRN
  Administered 2014-04-22: 4 mg via INTRAVENOUS
  Filled 2014-04-22: qty 2

## 2014-04-22 MED ORDER — ADULT MULTIVITAMIN W/MINERALS CH
1.0000 | ORAL_TABLET | Freq: Every day | ORAL | Status: DC
  Administered 2014-04-23: 1 via ORAL
  Filled 2014-04-22 (×2): qty 1

## 2014-04-22 MED ORDER — GLUCOSAMINE HCL 1000 MG PO TABS: 2000.0000 mg | ORAL_TABLET | Freq: Every day | ORAL | Status: DC

## 2014-04-22 MED ORDER — GADOBENATE DIMEGLUMINE 529 MG/ML IV SOLN
20.0000 mL | Freq: Once | INTRAVENOUS | Status: AC | PRN
  Administered 2014-04-22: 20 mL via INTRAVENOUS

## 2014-04-22 MED ORDER — LORAZEPAM 2 MG/ML IJ SOLN
1.0000 mg | Freq: Once | INTRAMUSCULAR | Status: AC
  Administered 2014-04-22: 1 mg via INTRAVENOUS
  Filled 2014-04-22: qty 1

## 2014-04-22 MED ORDER — HYDROMORPHONE HCL 1 MG/ML IJ SOLN
INTRAMUSCULAR | Status: AC
  Filled 2014-04-22: qty 1

## 2014-04-22 MED ORDER — SODIUM CHLORIDE 0.9 % IV SOLN
INTRAVENOUS | Status: DC
  Administered 2014-04-22 – 2014-04-23 (×2): via INTRAVENOUS

## 2014-04-22 MED ORDER — SODIUM CHLORIDE 0.9 % IV SOLN
3.0000 g | Freq: Three times a day (TID) | INTRAVENOUS | Status: DC
  Administered 2014-04-22 – 2014-04-23 (×4): 3 g via INTRAVENOUS
  Filled 2014-04-22 (×7): qty 3

## 2014-04-22 MED ORDER — KETOROLAC TROMETHAMINE 30 MG/ML IJ SOLN: 30.0000 mg | Freq: Four times a day (QID) | INTRAMUSCULAR | Status: DC | PRN

## 2014-04-22 MED ORDER — HYDROMORPHONE HCL 1 MG/ML IJ SOLN
1.0000 mg | INTRAMUSCULAR | Status: DC | PRN
  Administered 2014-04-22: 1 mg via INTRAVENOUS

## 2014-04-22 MED ORDER — HEPARIN SODIUM (PORCINE) 5000 UNIT/ML IJ SOLN
5000.0000 [IU] | Freq: Three times a day (TID) | INTRAMUSCULAR | Status: DC
  Administered 2014-04-22 – 2014-04-23 (×2): 5000 [IU] via SUBCUTANEOUS
  Filled 2014-04-22 (×6): qty 1

## 2014-04-22 MED ORDER — ASPIRIN 81 MG PO CHEW
81.0000 mg | CHEWABLE_TABLET | Freq: Every day | ORAL | Status: DC
  Administered 2014-04-23: 81 mg via ORAL
  Filled 2014-04-22 (×2): qty 1

## 2014-04-22 MED ORDER — OXYCODONE-ACETAMINOPHEN 5-325 MG PO TABS
1.0000 | ORAL_TABLET | Freq: Four times a day (QID) | ORAL | Status: DC | PRN
  Administered 2014-04-22 – 2014-04-23 (×2): 1 via ORAL
  Filled 2014-04-22 (×2): qty 1

## 2014-04-22 MED ORDER — ACETAMINOPHEN 80 MG PO CHEW
500.0000 mg | CHEWABLE_TABLET | Freq: Four times a day (QID) | ORAL | Status: DC | PRN
  Filled 2014-04-22: qty 7

## 2014-04-22 NOTE — Progress Notes (Signed)
PT SEEN/EXAMINED LAST NIGHT FINGER/HAND EXAMINED WILL REASSESS TODAY THIS AFTERNOON, IF HAND WORSENS MAY REQUIRE INCISION AND DRAINAGE WILL SEE HOW PATIENT RESPONDS TO IV ABX AND IMMOBILIZATIONS

## 2014-04-22 NOTE — Progress Notes (Signed)
Utilization Review completed.  

## 2014-04-22 NOTE — Progress Notes (Addendum)
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 Currently patient is not septic. Hand surgeon was consulted, may need incision and drainage MRI of the left hand ordered Continue IV Unasyn -follow up blood culture -pain control -follow up hand surgeon's recommendation -IVF: NS 100 cc/h -applied hand splint -Tdap given  Nausea likely secondary to Dilaudid Switched to Toradol  Intractable Headache likely secondary to fever, hypertension Obtain CT of the head without contrast to rule out mass lesions    Tobacco abuse: Half pack a day for more than 20 years. -Consulted about the importance of quitting smoking -Nicotine patch  COPD: Stable. Not on medications.  -observe closely.   Code Status: full Family Communication: family updated about patient's clinical progress Disposition Plan:  As above    Brief narrative: Chief Complaint: left hand pain and swelling after cat bite  HPI: Katrina [name] is a 59 y.o. female with PMH of COPD and arthritis, who presents with left hand pain and swelling after cat bite.  Patient reports that she started having left long finger swelling, redness, and pain after a cat bite sustained 2 days ago. Patient was bitten near the DIP joint. Pain is aggravated by movement of her finger. Pain is improved with elevation of her hand. No fever, chills. Tetanus is not up-to-date. Rabies vaccination of animal is UTD.  Patient denies fever, chills, fatigue, headaches, cough, chest pain, SOB, abdominal pain, diarrhea, constipation, dysuria, urgency, frequency, hematuria, skin rashes, joint pain or leg swelling.  Work  up in the ED demonstrates negative x-ray of left hand for acute abnormalities. No leukocytosis. Body temperature normal. Patient is admitted to inpatient for further evaluation and treatment. Hand surgeon was consulted.  Consultants:  Hand surgery    Procedures:  None  Antibiotics: Unasyn  HPI/Subjective: Patient nauseous, complaining of left hand pain  Objective: Filed Vitals:   04/21/14 2230 04/21/14 2300 04/22/14 0030 04/22/14 0601  BP: 162/79 154/76 152/69 138/70  Pulse: 86 80 87 82  Temp:   97.5 F (36.4 C) 97.8 F (36.6 C)  TempSrc:   Oral Oral  Resp:  18 18 20   Height:      Weight:      SpO2: 94% 96% 97% 93%    Intake/Output Summary (Last 24 hours) at 04/22/14 1244 Last data filed at 04/22/14 0933  Gross per 24 hour  Intake      0 ml  Output      0 ml  Net      0 ml    Exam:  General: alert & oriented x 3 In NAD  Cardiovascular: RRR, nl S1 s2  Respiratory: Decreased breath sounds at the bases, scattered rhonchi, no crackles  Abdomen: soft +BS NT/ND, no masses palpable  Extremities: No cyanosis and no edema      Data Reviewed: Basic Metabolic Panel:  Recent Labs Lab 04/21/14 1847 04/22/14 0115  NA 139 142  K 4.1 3.7  CL 105 111  CO2 28 24  GLUCOSE 106* 123*  BUN 12 11  CREATININE 0.76 0.73  CALCIUM 9.1 8.6    Liver Function Tests:  Recent Labs Lab 04/21/14 1847 04/22/14 0115  AST 22 22  ALT 23 20  ALKPHOS 111 94  BILITOT 0.4 0.5  PROT 6.9 6.0  ALBUMIN 3.8 3.4*   No results for input(s): LIPASE, AMYLASE in the last 168 hours. No results for input(s): AMMONIA in the last 168 hours.  CBC:  Recent Labs Lab 04/21/14 1847 04/22/14 0115  WBC 9.7 9.9  NEUTROABS 4.5  --   HGB 14.3 12.8  HCT 42.8 39.4  MCV 96.2 94.5  PLT 274 243    Cardiac Enzymes: No results for input(s): CKTOTAL, CKMB, CKMBINDEX, TROPONINI in the last 168 hours. BNP (last 3 results) No results for input(s): PROBNP in the last 8760  hours.   CBG: No results for input(s): GLUCAP in the last 168 hours.  No results found for this or any previous visit (from the past 240 hour(s)).   Studies: Dg Hand Complete Left  04/21/2014   CLINICAL DATA:  CT bite 2 days ago.  Injury on left middle finger.  EXAM: LEFT HAND - COMPLETE 3+ VIEW  COMPARISON:  None.  FINDINGS: No acute bony abnormality. Specifically, no fracture, subluxation, or dislocation. Soft tissues are intact. No radiopaque foreign body.  IMPRESSION: No acute bony abnormality.   Electronically Signed   By: Rolm Baptise M.D.   On: 04/21/2014 19:33    Scheduled Meds: . ampicillin-sulbactam (UNASYN) IV  3 g Intravenous Q8H  . aspirin  81 mg Oral Daily  . heparin  5,000 Units Subcutaneous 3 times per day  . multivitamin with minerals  1 tablet Oral Daily  . nicotine  14 mg Transdermal Daily   Continuous Infusions: . sodium chloride      Active Problems:   Cat bite of finger   COPD (chronic obstructive pulmonary disease)   Cellulitis of finger of left hand   Tobacco abuse   Cellulitis of hand    Time spent: 40 minutes   [facility] Pager [phone]. If 7PM-7AM, please contact night-coverage at [email or web address], password [facility] 04/22/2014, 12:44 PM  LOS: 1 day

## 2014-04-22 NOTE — Consult Note (Signed)
Reason for Consult:LEFT HAND CAT BITE Referring Physician: DR. ABROL/EMERGENCY DEPARTMENT  Katrina [name] is an 59 y.o. female.  HPI: Katrina [name] is a 59 y.o. female with PMH of COPD and arthritis, who presents with left hand pain and swelling after cat bite.  Patient reports that she started having left long finger swelling, redness, and pain after a cat bite sustained 2 days ago. Patient was bitten near the DIP joint. Pain is aggravated by movement of her finger. Pain is improved with elevation of her hand. No fever, chills. Tetanus is not up-to-date. Rabies vaccination of animal is UTD.  Patient denies fever, chills, fatigue, headaches, cough, chest pain, SOB, abdominal pain, diarrhea, constipation, dysuria, urgency, frequency, hematuria, skin rashes, joint pain or leg swelling.  Work up in the ED demonstrates negative x-ray of left hand for acute abnormalities. No leukocytosis. Body temperature normal. Patient is admitted to inpatient for further evaluation and treatment. Hand surgeon was consulted.  Review of Systems: As presented in the history of presenting illness, rest negative.  Where does patient live? At home Can patient participate in ADLs? Yes  Past Medical History  Diagnosis Date  . Asthma     hx of asthma 3/12- hospitalized   . Arthritis     knees, hips, elbows   . Headache(784.0)   . COPD (chronic obstructive pulmonary disease)     Past Surgical History  Procedure Laterality Date  . Joint replacement    . Other surgical history      arthroswcopic surgery right knee   . Other surgical history      arthroscopic surgery left knee x 2   . Other surgical history      bunionectomy right foot   . Other surgical history      C Section x 2   . Cholecystectomy    . Other surgical history      carpal tunnel on left   . Total hip arthroplasty  05/02/2011    Procedure: TOTAL HIP ARTHROPLASTY ANTERIOR APPROACH;  Surgeon: Mcarthur Rossetti, MD;  Location: [facility]  [facility];  Service: Orthopedics;  Laterality: Left;  Left Total Hip Arthroplasty, Direct Anterior Approach  . Back surgery      x2  . Left elbow surgery     . Total knee arthroplasty Right 12/03/2012    Procedure: RIGHT TOTAL KNEE ARTHROPLASTY;  Surgeon: Mcarthur Rossetti, MD;  Location: [facility];  Service: Orthopedics;  Laterality: Right;  . Right foot bunionectomy     . Knee arthroscopy      bilateral x 2   . Cesarean section      x 2  . Laparoscopy    . Carpal tunnel release      left   . Ulnar nerve transposition      left   . Upper gi endoscopy      x 2  . Knee arthroscopy Left 11/03/2013    Procedure: LEFT KNEE ARTHROSCOPY WITH DEBRIDEMENT, PARTIAL SYNOVECTOMY;  Surgeon: Mcarthur Rossetti, MD;  Location: [facility];  Service: Orthopedics;  Laterality: Left;    No family history on file.  Social History:  reports that she has been smoking Cigarettes.  She has a 25 pack-year smoking history. She has never used smokeless tobacco. She reports that she drinks alcohol. She reports that she does not use illicit drugs.  Allergies:  Allergies  Allergen Reactions  . Codeine Itching and Nausea Only    Medications: I have reviewed the patient's current  medications.  Results for orders placed or performed during the hospital encounter of 04/21/14 (from the past 48 hour(s))  CBC with Differential     Status: None   Collection Time: 04/21/14  6:47 PM  Result Value Ref Range   WBC 9.7 4.0 - 10.5 K/uL   RBC 4.45 3.87 - 5.11 MIL/uL   Hemoglobin 14.3 12.0 - 15.0 g/dL   HCT 42.8 36.0 - 46.0 %   MCV 96.2 78.0 - 100.0 fL   MCH 32.1 26.0 - 34.0 pg   MCHC 33.4 30.0 - 36.0 g/dL   RDW 13.7 11.5 - 15.5 %   Platelets 274 150 - 400 K/uL   Neutrophils Relative % 47 43 - 77 %   Neutro Abs 4.5 1.7 - 7.7 K/uL   Lymphocytes Relative 42 12 - 46 %   Lymphs Abs 4.0 0.7 - 4.0 K/uL   Monocytes Relative 8 3 - 12 %   Monocytes Absolute 0.7 0.1 - 1.0 K/uL   Eosinophils Relative 4 0 - 5 %   Eosinophils  Absolute 0.4 0.0 - 0.7 K/uL   Basophils Relative 1 0 - 1 %   Basophils Absolute 0.1 0.0 - 0.1 K/uL  Comprehensive metabolic panel     Status: Abnormal   Collection Time: 04/21/14  6:47 PM  Result Value Ref Range   Sodium 139 135 - 145 mmol/L    Comment: Please note change in reference range.   Potassium 4.1 3.5 - 5.1 mmol/L    Comment: Please note change in reference range.   Chloride 105 96 - 112 mEq/L   CO2 28 19 - 32 mmol/L   Glucose, Bld 106 (H) 70 - 99 mg/dL   BUN 12 6 - 23 mg/dL   Creatinine, Ser 0.76 0.50 - 1.10 mg/dL   Calcium 9.1 8.4 - 10.5 mg/dL   Total Protein 6.9 6.0 - 8.3 g/dL   Albumin 3.8 3.5 - 5.2 g/dL   AST 22 0 - 37 U/L   ALT 23 0 - 35 U/L   Alkaline Phosphatase 111 39 - 117 U/L   Total Bilirubin 0.4 0.3 - 1.2 mg/dL   GFR calc non Af Amer >90 >90 mL/min   GFR calc Af Amer >90 >90 mL/min    Comment: (NOTE) The eGFR has been calculated using the CKD EPI equation. This calculation has not been validated in all clinical situations. eGFR's persistently <90 mL/min signify possible Chronic Kidney Disease.    Anion gap 6 5 - 15  Comprehensive metabolic panel     Status: Abnormal   Collection Time: 04/22/14  1:15 AM  Result Value Ref Range   Sodium 142 135 - 145 mmol/L    Comment: Please note change in reference range.   Potassium 3.7 3.5 - 5.1 mmol/L    Comment: Please note change in reference range.   Chloride 111 96 - 112 mEq/L   CO2 24 19 - 32 mmol/L   Glucose, Bld 123 (H) 70 - 99 mg/dL   BUN 11 6 - 23 mg/dL   Creatinine, Ser 0.73 0.50 - 1.10 mg/dL   Calcium 8.6 8.4 - 10.5 mg/dL   Total Protein 6.0 6.0 - 8.3 g/dL   Albumin 3.4 (L) 3.5 - 5.2 g/dL   AST 22 0 - 37 U/L   ALT 20 0 - 35 U/L   Alkaline Phosphatase 94 39 - 117 U/L   Total Bilirubin 0.5 0.3 - 1.2 mg/dL   GFR calc non Af Amer >90 >90  mL/min   GFR calc Af Amer >90 >90 mL/min    Comment: (NOTE) The eGFR has been calculated using the CKD EPI equation. This calculation has not been validated in  all clinical situations. eGFR's persistently <90 mL/min signify possible Chronic Kidney Disease.    Anion gap 7 5 - 15  CBC     Status: None   Collection Time: 04/22/14  1:15 AM  Result Value Ref Range   WBC 9.9 4.0 - 10.5 K/uL   RBC 4.17 3.87 - 5.11 MIL/uL   Hemoglobin 12.8 12.0 - 15.0 g/dL   HCT 39.4 36.0 - 46.0 %   MCV 94.5 78.0 - 100.0 fL   MCH 30.7 26.0 - 34.0 pg   MCHC 32.5 30.0 - 36.0 g/dL   RDW 13.8 11.5 - 15.5 %   Platelets 243 150 - 400 K/uL  Protime-INR     Status: None   Collection Time: 04/22/14  1:15 AM  Result Value Ref Range   Prothrombin Time 13.6 11.6 - 15.2 seconds   INR 1.03 0.00 - 1.49    Ct Head Wo Contrast  04/22/2014   CLINICAL DATA:  Headache started last night.  EXAM: CT HEAD WITHOUT CONTRAST  TECHNIQUE: Contiguous axial images were obtained from the base of the skull through the vertex without intravenous contrast.  COMPARISON:  None.  FINDINGS: No acute intracranial abnormality. Specifically, no hemorrhage, hydrocephalus, mass lesion, acute infarction, or significant intracranial injury. No acute calvarial abnormality. Mucosal thickening within the ethmoid air cells and left maxillary sinus. No air-fluid levels. Mastoids are clear.  IMPRESSION: No acute intracranial abnormality.   Electronically Signed   By: Rolm Baptise M.D.   On: 04/22/2014 15:30   Mr Hand Left W Wo Contrast  04/22/2014   CLINICAL DATA:  Status post cat bite 2 days ago with swelling, redness and pain of the left long finger since the injury.  EXAM: MRI OF THE LEFT HAND WITHOUT AND WITH CONTRAST  TECHNIQUE: Multiplanar, multisequence MR imaging was performed both before and after administration of intravenous contrast.  CONTRAST:  20 mL MULTIHANCE GADOBENATE DIMEGLUMINE 529 MG/ML IV SOLN  COMPARISON:  Plain films of the left hand 04/21/2014  FINDINGS: Subcutaneous edema is seen in a door cells soft tissues of the fingers. There is enhancement of the subcutaneous tissues of the dorsal aspect of  the left long finger consistent with cellulitis. No focal fluid collection is identified. There is no evidence of tenosynovitis or osteomyelitis. No joint effusion other finding to suggest septic joint is identified. Image bones, joints and soft tissues otherwise appear normal. All visualized tendons are intact.  IMPRESSION: Findings consistent with cellulitis of the left long finger most notable in the dorsal soft tissues without evidence of abscess, osteomyelitis or septic joint.   Electronically Signed   By: Inge Rise M.D.   On: 04/22/2014 15:03   Dg Hand Complete Left  04/21/2014   CLINICAL DATA:  CT bite 2 days ago.  Injury on left middle finger.  EXAM: LEFT HAND - COMPLETE 3+ VIEW  COMPARISON:  None.  FINDINGS: No acute bony abnormality. Specifically, no fracture, subluxation, or dislocation. Soft tissues are intact. No radiopaque foreign body.  IMPRESSION: No acute bony abnormality.   Electronically Signed   By: Rolm Baptise M.D.   On: 04/21/2014 19:33    ROS NO RECENT ILLNESSES OR HOSPITALIZATIONS Blood pressure 154/78, pulse 84, temperature 97.8 F (36.6 C), temperature source Oral, resp. rate 15, height _0  (1.702 m),  weight 108.863 kg (240 lb), SpO2 97 %. Physical Exam General Appearance:  Alert, cooperative, no distress, appears stated age  Head:  Normocephalic, without obvious abnormality, atraumatic  Eyes:  Pupils equal, conjunctiva/corneas clear,         Throat: Lips, mucosa, and tongue normal; teeth and gums normal  Neck: No visible masses     Lungs:   respirations unlabored  Chest Wall:  No tenderness or deformity  Heart:  Regular rate and rhythm,  Abdomen:   Soft, non-tender,         Extremities: LEFT HAND: VOLAR SPLINT IN  PLACE FINGERS WARM WELL PERFUSED FINGERS  AND HAND LOOK BETTER TODAY NO EVIDENCE OF WORSENING ABSCESS OR TENOSYNOVITIS   Pulses: 2+ and symmetric  Skin: Skin color, texture, turgor normal, no rashes or lesions     Neurologic: Normal    Assessment/Plan: LEFT HAND CAT BITE  EXAM C/W FINDINGS OF MRI NO EVIDENCE OF DEEP SPACE ABSCESS, CELLULITIS PRESENT  DO NOT RECOMMEND SURGERY RIGHT NOW CONTINUE IV ABX IMMOBILIZATION ELEVATION WILL REASSESS IN AM OK TO EAT NOW NO SURGERY TODAY  Linna Hoff 04/22/2014, 4:38 PM

## 2014-04-23 DIAGNOSIS — L03012 Cellulitis of left finger: Secondary | ICD-10-CM

## 2014-04-23 LAB — COMPREHENSIVE METABOLIC PANEL
ALBUMIN: 3.7 g/dL (ref 3.5–5.2)
ALK PHOS: 107 U/L (ref 39–117)
ALT: 22 U/L (ref 0–35)
ANION GAP: 7 (ref 5–15)
AST: 26 U/L (ref 0–37)
BILIRUBIN TOTAL: 0.7 mg/dL (ref 0.3–1.2)
BUN: 5 mg/dL — ABNORMAL LOW (ref 6–23)
CHLORIDE: 107 meq/L (ref 96–112)
CO2: 27 mmol/L (ref 19–32)
Calcium: 9 mg/dL (ref 8.4–10.5)
Creatinine, Ser: 0.74 mg/dL (ref 0.50–1.10)
GFR calc Af Amer: >90 mL/min (ref >90)
Glucose, Bld: 126 mg/dL — ABNORMAL HIGH (ref 70–99)
POTASSIUM: 3.8 mmol/L (ref 3.5–5.1)
Sodium: 141 mmol/L (ref 135–145)
TOTAL PROTEIN: 6.8 g/dL (ref 6.0–8.3)

## 2014-04-23 LAB — CBC
HCT: 40.3 % (ref 36.0–46.0)
Hemoglobin: 13.7 g/dL (ref 12.0–15.0)
MCH: 32.1 pg (ref 26.0–34.0)
MCHC: 34 g/dL (ref 30.0–36.0)
MCV: 94.4 fL (ref 78.0–100.0)
Platelets: 234 10*3/uL (ref 150–400)
RBC: 4.27 MIL/uL (ref 3.87–5.11)
RDW: 13.6 % (ref 11.5–15.5)
WBC: 9.4 10*3/uL (ref 4.0–10.5)

## 2014-04-23 MED ORDER — AMOXICILLIN-POT CLAVULANATE 875-125 MG PO TABS: 1.0000 | ORAL_TABLET | Freq: Two times a day (BID) | ORAL | Status: DC

## 2014-04-23 NOTE — Progress Notes (Signed)
Discussed discharge summary with patient. Reviewed all medications with patient. Patient ready for discharge.  

## 2014-04-23 NOTE — Progress Notes (Signed)
PT SEEN/EXAMINED SPLINT AND DRESSING CHANGED. SMALL AMOUNT OF SUB Q PURULENCE EXPRESSED FROM DORSUM OF LONG FINGER REDNESS MARKEDLY DIMINISHED FINGERS AND HAND LOOK A LOT BETTER  OK TO GO HOME ON ORAL ABX CONTINUE WITH CURRENT SPLINT UNTIL F/U APPT WILL NEED TO SEE ME ON Thursday IN OFFICE DISCHARGE INFO COMPLETED AUGMENTIN RECOMMENDED

## 2014-04-23 NOTE — Progress Notes (Signed)
[facility] PROGRESS NOTE  Katrina [name] EQA:[id number] DOB: 1955-11-01 DOA: 04/21/2014 PCP: Shirline Frees, MD  Brief narrative: I have seen and examined Katrina [name] at bedside and reviewed her chart, including note by Dr. Allyson Sabal. Appreciate orthopedics input. Katrina [name] is a pleasant 59 y.o. female with PMH of COPD and arthritis, who presented with left hand cellulitis after cat bite. Fortunately MRI of the hand did not show abscess or osteomyelitis.  Cellulitis has improved on Unasyn. Blood cultures show no growth to date. Will continue current management with orthopedics direction. Hope to transition antibiotics to oral soon. Patient states that she feels better. Her neck and nausea have resolved.  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 Currently patient is not septic. Hand surgeon was consulted and felt that there was no need for incision and drainage yesterday. MRI of the left hand reviewed Continue IV Unasyn -follow up blood culture -pain control -follow up hand surgeon's recommendation -Discontinue NS  -Continue hand splint -Tdap given  Nausea resolved   Intractable Headache likely secondary to fever, hypertension  CT of the head without contrast normal    Tobacco abuse: Half pack a day for more than 20 years. -Counselled about the importance of quitting smoking -Nicotine patch  COPD: Stable. Not on medications.  -observe closely.   Code Status: full Family Communication: family updated about patient's clinical progress Disposition Plan: As above    Consultants:  Hand surgery    Procedures:  None  Antibiotics: Unasyn Objective: Filed Vitals:   04/23/14 0611  BP: 147/68  Pulse: 95  Temp: 97.7 F (36.5 C)  Resp: 16    Intake/Output Summary (Last 24 hours) at 04/23/14 0820 Last data filed at 04/23/14 9798  Gross per 24 hour  Intake    440 ml  Output      0 ml  Net    440 ml    Filed Weights   04/21/14 1833  Weight: 108.863 kg (240 lb)    Exam:   General:  Sitting up eating not in distress.  Cardiovascular: Normal  Respiratory: Normal  Abdomen: Normal  Musculoskeletal: Left hand splint. Some erythema of the left long finger. No drainage.  Data Reviewed: Basic Metabolic Panel:  Recent Labs Lab 04/21/14 1847 04/22/14 0115  NA 139 142  K 4.1 3.7  CL 105 111  CO2 28 24  GLUCOSE 106* 123*  BUN 12 11  CREATININE 0.76 0.73  CALCIUM 9.1 8.6   Liver Function Tests:  Recent Labs Lab 04/21/14 1847 04/22/14 0115  AST 22 22  ALT 23 20  ALKPHOS 111 94  BILITOT 0.4 0.5  PROT 6.9 6.0  ALBUMIN 3.8 3.4*   No results for input(s): LIPASE, AMYLASE in the last 168 hours. No results for input(s): AMMONIA in the last 168 hours. CBC:  Recent Labs Lab 04/21/14 1847 04/22/14 0115 04/23/14 0400  WBC 9.7 9.9 9.4  NEUTROABS 4.5  --   --   HGB 14.3 12.8 13.7  HCT 42.8 39.4 40.3  MCV 96.2 94.5 94.4  PLT 274 243 234   Cardiac Enzymes: No results for input(s): CKTOTAL, CKMB, CKMBINDEX, TROPONINI in the last 168 hours. BNP (last 3 results) No results for input(s): PROBNP in the last 8760 hours. CBG: No results for input(s): GLUCAP in the last 168 hours.  No results found for this or any previous visit (from the past 240 hour(s)).   Studies: Ct Head Wo Contrast  04/22/2014  CLINICAL DATA:  Headache started last night.  EXAM: CT HEAD WITHOUT CONTRAST  TECHNIQUE: Contiguous axial images were obtained from the base of the skull through the vertex without intravenous contrast.  COMPARISON:  None.  FINDINGS: No acute intracranial abnormality. Specifically, no hemorrhage, hydrocephalus, mass lesion, acute infarction, or significant intracranial injury. No acute calvarial abnormality. Mucosal thickening within the ethmoid air cells and left maxillary sinus. No air-fluid levels. Mastoids are clear.  IMPRESSION: No acute intracranial abnormality.    Electronically Signed   By: Rolm Baptise M.D.   On: 04/22/2014 15:30   Mr Hand Left W Wo Contrast  04/22/2014   CLINICAL DATA:  Status post cat bite 2 days ago with swelling, redness and pain of the left long finger since the injury.  EXAM: MRI OF THE LEFT HAND WITHOUT AND WITH CONTRAST  TECHNIQUE: Multiplanar, multisequence MR imaging was performed both before and after administration of intravenous contrast.  CONTRAST:  20 mL MULTIHANCE GADOBENATE DIMEGLUMINE 529 MG/ML IV SOLN  COMPARISON:  Plain films of the left hand 04/21/2014  FINDINGS: Subcutaneous edema is seen in a door cells soft tissues of the fingers. There is enhancement of the subcutaneous tissues of the dorsal aspect of the left long finger consistent with cellulitis. No focal fluid collection is identified. There is no evidence of tenosynovitis or osteomyelitis. No joint effusion other finding to suggest septic joint is identified. Image bones, joints and soft tissues otherwise appear normal. All visualized tendons are intact.  IMPRESSION: Findings consistent with cellulitis of the left long finger most notable in the dorsal soft tissues without evidence of abscess, osteomyelitis or septic joint.   Electronically Signed   By: Inge Rise M.D.   On: 04/22/2014 15:03   Dg Hand Complete Left  04/21/2014   CLINICAL DATA:  CT bite 2 days ago.  Injury on left middle finger.  EXAM: LEFT HAND - COMPLETE 3+ VIEW  COMPARISON:  None.  FINDINGS: No acute bony abnormality. Specifically, no fracture, subluxation, or dislocation. Soft tissues are intact. No radiopaque foreign body.  IMPRESSION: No acute bony abnormality.   Electronically Signed   By: Rolm Baptise M.D.   On: 04/21/2014 19:33    Scheduled Meds: . ampicillin-sulbactam (UNASYN) IV  3 g Intravenous Q8H  . aspirin  81 mg Oral Daily  . heparin  5,000 Units Subcutaneous 3 times per day  . multivitamin with minerals  1 tablet Oral Daily  . nicotine  14 mg Transdermal Daily   Continuous  Infusions: . sodium chloride 100 mL/hr at 04/22/14 2252    Active Problems:   Cat bite of finger   COPD (chronic obstructive pulmonary disease)   Cellulitis of finger of left hand   Tobacco abuse   Cellulitis of hand    Time spent: 15 minutes    Katrina [name]  [facility] Pager [phone]. If 7PM-7AM, please contact night-coverage at [email or web address], password [facility] 04/23/2014, 8:20 AM  LOS: 2 days

## 2014-04-23 NOTE — Discharge Instructions (Signed)
KEEP BANDAGE CLEAN AND DRY CALL OFFICE FOR F/U APPT [phone] ON THURSDAY KEEP HAND ELEVATED ABOVE HEART OK TO APPLY ICE TO OPERATIVE AREA CONTACT OFFICE IF ANY WORSENING PAIN OR CONCERNS.

## 2014-04-23 NOTE — Discharge Summary (Signed)
LASHAWNTA [name], is a 59 y.o. female  DOB [birth year]  MRN [id number].  Admission date:  04/21/2014  Admitting Physician  Ivor Costa, MD  Discharge Date:  04/23/2014   Primary MD  Shirline Frees, MD  Recommendations for primary care physician for things to follow:   Follow Orthopedics recommendation.   Admission Diagnosis  Bite by animal [T14.8, W55.81XA] Cellulitis of finger of left hand [H54.562]   Discharge Diagnosis  Bite by animal [T14.8, W55.81XA] Cellulitis of finger of left hand [B63.893]    Active Problems:   Cat bite of finger   COPD (chronic obstructive pulmonary disease)   Tobacco abuse      Past Medical History  Diagnosis Date  . Asthma     hx of asthma 3/12- hospitalized   . Arthritis     knees, hips, elbows   . Headache(784.0)   . COPD (chronic obstructive pulmonary disease)     Past Surgical History  Procedure Laterality Date  . Joint replacement    . Other surgical history      arthroswcopic surgery right knee   . Other surgical history      arthroscopic surgery left knee x 2   . Other surgical history      bunionectomy right foot   . Other surgical history      C Section x 2   . Cholecystectomy    . Other surgical history      carpal tunnel on left   . Total hip arthroplasty  05/02/2011    Procedure: TOTAL HIP ARTHROPLASTY ANTERIOR APPROACH;  Surgeon: Mcarthur Rossetti, MD;  Location: [facility];  Service: Orthopedics;  Laterality: Left;  Left Total Hip Arthroplasty, Direct Anterior Approach  . Back surgery      x2  . Left elbow surgery     . Total knee arthroplasty Right 12/03/2012    Procedure: RIGHT TOTAL KNEE ARTHROPLASTY;  Surgeon: Mcarthur Rossetti, MD;  Location: [facility];  Service: Orthopedics;  Laterality: Right;  . Right foot bunionectomy     . Knee arthroscopy     bilateral x 2   . Cesarean section      x 2  . Laparoscopy    . Carpal tunnel release      left   . Ulnar nerve transposition      left   . Upper gi endoscopy      x 2  . Knee arthroscopy Left 11/03/2013    Procedure: LEFT KNEE ARTHROSCOPY WITH DEBRIDEMENT, PARTIAL SYNOVECTOMY;  Surgeon: Mcarthur Rossetti, MD;  Location: [facility];  Service: Orthopedics;  Laterality: Left;       History of present illness and  Hospital Course:     Kindly see H&P for history of present illness and admission details, please review complete Labs, Consult reports and Test reports for all details in brief  HPI  from the history and physical done on the day of admission    [name] is a pleasant 59  y.o. female with PMH of COPD and arthritis, who presented with left hand cellulitis after a cat bite. Fortunately MRI of the hand did not show abscess or osteomyelitis. Cellulitis improved on Unasyn. Blood cultures show no growth to date. A small amount of pus was expressed from the left long finger by Dr. Caralyn Guile. He has recommended for patient to discharge on Augmentin with splint in place and to follow with him in the office later this week. Will discharge her on Augmentin.  Discharge Condition: Stable   Follow UP  Follow-up Information    Follow up with Linna Hoff, MD.   Specialty:  Orthopedic Surgery   Contact information:   [address] [place] [address] [phone]         Discharge Instructions  and  Discharge Medications    Discharge Instructions    Diet - low sodium heart healthy    Complete by:  As directed      Increase activity slowly    Complete by:  As directed             Medication List    TAKE these medications        amoxicillin-clavulanate 875-125 MG per tablet  Commonly known as:  AUGMENTIN  Take 1 tablet by mouth 2 (two) times daily.     aspirin 81 MG chewable tablet  Chew 81 mg by mouth daily.      multivitamin with minerals Tabs tablet  Take 1 tablet by mouth daily.          Diet and Activity recommendation: See Discharge Instructions above   Consults obtained - orthopedics   Major procedures and Radiology Reports - PLEASE review detailed and final reports for all details, in brief -    Ct Head Wo Contrast  04/22/2014   CLINICAL DATA:  Headache started last night.  EXAM: CT HEAD WITHOUT CONTRAST  TECHNIQUE: Contiguous axial images were obtained from the base of the skull through the vertex without intravenous contrast.  COMPARISON:  None.  FINDINGS: No acute intracranial abnormality. Specifically, no hemorrhage, hydrocephalus, mass lesion, acute infarction, or significant intracranial injury. No acute calvarial abnormality. Mucosal thickening within the ethmoid air cells and left maxillary sinus. No air-fluid levels. Mastoids are clear.  IMPRESSION: No acute intracranial abnormality.   Electronically Signed   By: Rolm Baptise M.D.   On: 04/22/2014 15:30   Mr Hand Left W Wo Contrast  04/22/2014   CLINICAL DATA:  Status post cat bite 2 days ago with swelling, redness and pain of the left long finger since the injury.  EXAM: MRI OF THE LEFT HAND WITHOUT AND WITH CONTRAST  TECHNIQUE: Multiplanar, multisequence MR imaging was performed both before and after administration of intravenous contrast.  CONTRAST:  20 mL MULTIHANCE GADOBENATE DIMEGLUMINE 529 MG/ML IV SOLN  COMPARISON:  Plain films of the left hand 04/21/2014  FINDINGS: Subcutaneous edema is seen in a door cells soft tissues of the fingers. There is enhancement of the subcutaneous tissues of the dorsal aspect of the left long finger consistent with cellulitis. No focal fluid collection is identified. There is no evidence of tenosynovitis or osteomyelitis. No joint effusion other finding to suggest septic joint is identified. Image bones, joints and soft tissues otherwise appear normal. All visualized tendons are intact.  IMPRESSION:  Findings consistent with cellulitis of the left long finger most notable in the dorsal soft tissues without evidence of abscess, osteomyelitis or septic joint.   Electronically Signed  By: Inge Rise M.D.   On: 04/22/2014 15:03   Dg Hand Complete Left  04/21/2014   CLINICAL DATA:  CT bite 2 days ago.  Injury on left middle finger.  EXAM: LEFT HAND - COMPLETE 3+ VIEW  COMPARISON:  None.  FINDINGS: No acute bony abnormality. Specifically, no fracture, subluxation, or dislocation. Soft tissues are intact. No radiopaque foreign body.  IMPRESSION: No acute bony abnormality.   Electronically Signed   By: Rolm Baptise M.D.   On: 04/21/2014 19:33    Micro Results    Recent Results (from the past 240 hour(s))  Culture, blood (routine x 2)     Status: None (Preliminary result)   Collection Time: 04/22/14  1:15 AM  Result Value Ref Range Status   Specimen Description BLOOD RIGHT HAND  Final   Special Requests BOTTLES DRAWN AEROBIC ONLY 10 CC  Final   Culture   Final           BLOOD CULTURE RECEIVED NO GROWTH TO DATE CULTURE WILL BE HELD FOR 5 DAYS BEFORE ISSUING A FINAL NEGATIVE REPORT Performed at [facility]    Report Status PENDING  Incomplete  Culture, blood (routine x 2)     Status: None (Preliminary result)   Collection Time: 04/22/14  1:30 AM  Result Value Ref Range Status   Specimen Description BLOOD RIGHT HAND  Final   Special Requests BOTTLES DRAWN AEROBIC ONLY 10 CC  Final   Culture   Final           BLOOD CULTURE RECEIVED NO GROWTH TO DATE CULTURE WILL BE HELD FOR 5 DAYS BEFORE ISSUING A FINAL NEGATIVE REPORT Performed at [facility]    Report Status PENDING  Incomplete       Today   Subjective:   Suttyn Cryder today has no headache,no chest abdominal pain,no new weakness tingling or numbness, feels much better wants to go home today.   Objective:   Blood pressure 158/94, pulse 75, temperature 97.7 F (36.5 C), temperature source Oral, resp. rate  16, height 5\' 7"  (1.702 m), weight 108.863 kg (240 lb), SpO2 96 %.   Intake/Output Summary (Last 24 hours) at 04/23/14 1401 Last data filed at 04/23/14 1334  Gross per 24 hour  Intake   1700 ml  Output      0 ml  Net   1700 ml    Exam Awake Alert, Oriented x 3, No new F.N deficits, Normal affect [place].AT,PERRAL Supple Neck,No JVD, No cervical lymphadenopathy appriciated.  Symmetrical Chest wall movement, Good air movement bilaterally, CTAB RRR,No Gallops,Rubs or new Murmurs, No Parasternal Heave +ve B.Sounds, Abd Soft, Non tender, No organomegaly appriciated, No rebound -guarding or rigidity. No Cyanosis, Clubbing or edema, No new Rash or bruise  Data Review   CBC w Diff: Lab Results  Component Value Date   WBC 9.4 04/23/2014   HGB 13.7 04/23/2014   HCT 40.3 04/23/2014   PLT 234 04/23/2014   LYMPHOPCT 42 04/21/2014   MONOPCT 8 04/21/2014   EOSPCT 4 04/21/2014   BASOPCT 1 04/21/2014    CMP: Lab Results  Component Value Date   NA 141 04/23/2014   K 3.8 04/23/2014   CL 107 04/23/2014   CO2 27 04/23/2014   BUN 5* 04/23/2014   CREATININE 0.74 04/23/2014   PROT 6.8 04/23/2014   ALBUMIN 3.7 04/23/2014   BILITOT 0.7 04/23/2014   ALKPHOS 107 04/23/2014   AST 26 04/23/2014   ALT 22 04/23/2014  .  Total Time in preparing paper work, data evaluation and todays exam - 35 minutes  Tyus Kallam M.D on 04/23/2014 at 2:01 PM  [facility] Office  [phone]

## 2014-04-24 LAB — HIV ANTIBODY (ROUTINE TESTING W REFLEX)
HIV 1/O/2 Abs-Index Value: <1 (ref <1.00)
HIV-1/HIV-2 Ab: NONREACTIVE

## 2014-04-28 LAB — CULTURE, BLOOD (ROUTINE X 2)
Culture: NO GROWTH
Culture: NO GROWTH

## 2014-06-16 ENCOUNTER — Other Ambulatory Visit: Payer: Self-pay | Admitting: Orthopaedic Surgery

## 2014-06-16 DIAGNOSIS — M25551 Pain in right hip: Secondary | ICD-10-CM

## 2014-06-28 ENCOUNTER — Ambulatory Visit
Admission: RE | Admit: 2014-06-28 | Discharge: 2014-06-28 | Disposition: A | Discharge status: Home / Self Care | Payer: Medicare Other | Source: Ambulatory Visit | Attending: Orthopaedic Surgery | Admitting: Orthopaedic Surgery

## 2014-06-28 DIAGNOSIS — M25551 Pain in right hip: Secondary | ICD-10-CM

## 2014-11-28 ENCOUNTER — Other Ambulatory Visit (HOSPITAL_COMMUNITY): Payer: Self-pay | Admitting: Orthopaedic Surgery

## 2014-12-06 NOTE — Patient Instructions (Addendum)
YOUR PROCEDURE IS SCHEDULED ON :  12/15/14  REPORT TO [facility] MAIN ENTRANCE FOLLOW SIGNS TO EAST ELEVATOR - GO TO 3rd FLOOR CHECK IN AT 3 EAST NURSES STATION (SHORT STAY) AT:  12:30 PM  CALL THIS NUMBER IF YOU HAVE PROBLEMS THE MORNING OF SURGERY [phone]  REMEMBER:ONLY 1 PER PERSON MAY GO TO SHORT STAY WITH YOU TO GET READY THE MORNING OF YOUR SURGERY  DO NOT EAT FOOD  AFTER MIDNIGHT  MAY HAVE CLEAR LIQUIDS UNTIL 8:30 AM  TAKE THESE MEDICINES THE MORNING OF SURGERY: NONE  CLEAR LIQUID DIET   Foods Allowed                                                                     Foods Excluded  Coffee and tea, regular and decaf                             liquids that you cannot  Plain Jell-O in any flavor                                             see through such as: Fruit ices (not with fruit pulp)                                     milk, soups, orange juice  Iced Popsicles                                                 All solid food Carbonated beverages, regular and diet                                    Cranberry, grape and apple juices Sports drinks like Gatorade Lightly seasoned clear broth or consume(fat free) Sugar, honey syrup   _____________________________________________________________________    YOU MAY NOT HAVE ANY METAL ON YOUR BODY INCLUDING HAIR PINS AND PIERCING'S. DO NOT WEAR JEWELRY, MAKEUP, LOTIONS, POWDERS OR PERFUMES. DO NOT WEAR NAIL POLISH. DO NOT SHAVE 48 HRS PRIOR TO SURGERY. MEN MAY SHAVE FACE AND NECK.  DO NOT [place]. [facility] IS NOT RESPONSIBLE FOR VALUABLES.  CONTACTS, DENTURES OR PARTIALS MAY NOT BE WORN TO SURGERY. LEAVE SUITCASE IN CAR. CAN BE BROUGHT TO ROOM AFTER SURGERY.  PATIENTS DISCHARGED THE DAY OF SURGERY WILL NOT BE ALLOWED TO DRIVE HOME.  PLEASE READ OVER THE FOLLOWING INSTRUCTION SHEETS _________________________________________________________________________________                                     [facility] - PREPARING FOR SURGERY  Before surgery, you can play an important role.  Because skin is not sterile, your skin needs to be as  free of germs as possible.  You can reduce the number of germs on your skin by washing with CHG (chlorahexidine gluconate) soap before surgery.  CHG is an antiseptic cleaner which kills germs and bonds with the skin to continue killing germs even after washing. Please DO NOT use if you have an allergy to CHG or antibacterial soaps.  If your skin becomes reddened/irritated stop using the CHG and inform your nurse when you arrive at Short Stay. Do not shave (including legs and underarms) for at least 48 hours prior to the first CHG shower.  You may shave your face. Please follow these instructions carefully:   1.  Shower with CHG Soap the night before surgery and the  morning of Surgery.   2.  If you choose to wash your hair, wash your hair first as usual with your  normal  Shampoo.   3.  After you shampoo, rinse your hair and body thoroughly to remove the  shampoo.                                         4.  Use CHG as you would any other liquid soap.  You can apply chg directly  to the skin and wash . Gently wash with scrungie or clean wascloth    5.  Apply the CHG Soap to your body ONLY FROM THE NECK DOWN.   Do not use on open                           Wound or open sores. Avoid contact with eyes, ears mouth and genitals (private parts).                        Genitals (private parts) with your normal soap.              6.  Wash thoroughly, paying special attention to the area where your surgery  will be performed.   7.  Thoroughly rinse your body with warm water from the neck down.   8.  DO NOT shower/wash with your normal soap after using and rinsing off  the CHG Soap .                9.  Pat yourself dry with a clean towel.             10.  Wear clean night clothes to bed after shower             11.  Place  clean sheets on your bed the night of your first shower and do not  sleep with pets.  Day of Surgery : Do not apply any lotions/deodorants the morning of surgery.  Please wear clean clothes to the hospital/surgery center.  FAILURE TO FOLLOW THESE INSTRUCTIONS MAY RESULT IN THE CANCELLATION OF YOUR SURGERY    PATIENT SIGNATURE_________________________________  ______________________________________________________________________

## 2014-12-08 ENCOUNTER — Encounter (HOSPITAL_COMMUNITY)
Admission: RE | Admit: 2014-12-08 | Discharge: 2014-12-08 | Disposition: A | Discharge status: Home / Self Care | Payer: Medicare Other | Source: Ambulatory Visit | Attending: Orthopaedic Surgery | Admitting: Orthopaedic Surgery

## 2014-12-08 ENCOUNTER — Encounter (HOSPITAL_COMMUNITY): Payer: Self-pay

## 2014-12-08 DIAGNOSIS — M1712 Unilateral primary osteoarthritis, left knee: Secondary | ICD-10-CM | POA: Diagnosis not present

## 2014-12-08 DIAGNOSIS — Z01812 Encounter for preprocedural laboratory examination: Secondary | ICD-10-CM | POA: Insufficient documentation

## 2014-12-08 LAB — BASIC METABOLIC PANEL
Anion gap: 10 (ref 5–15)
BUN: 9 mg/dL (ref 6–20)
CALCIUM: 9.4 mg/dL (ref 8.9–10.3)
CO2: 26 mmol/L (ref 22–32)
CREATININE: 0.78 mg/dL (ref 0.44–1.00)
Chloride: 105 mmol/L (ref 101–111)
GFR calc Af Amer: >60 mL/min (ref >60)
GFR calc non Af Amer: >60 mL/min (ref >60)
Glucose, Bld: 112 mg/dL — ABNORMAL HIGH (ref 65–99)
Potassium: 4.3 mmol/L (ref 3.5–5.1)
SODIUM: 141 mmol/L (ref 135–145)

## 2014-12-08 LAB — SURGICAL PCR SCREEN
MRSA, PCR: NEGATIVE
Staphylococcus aureus: NEGATIVE

## 2014-12-08 LAB — CBC
HCT: 43.2 % (ref 36.0–46.0)
Hemoglobin: 14 g/dL (ref 12.0–15.0)
MCH: 30.6 pg (ref 26.0–34.0)
MCHC: 32.4 g/dL (ref 30.0–36.0)
MCV: 94.5 fL (ref 78.0–100.0)
PLATELETS: 312 10*3/uL (ref 150–400)
RBC: 4.57 MIL/uL (ref 3.87–5.11)
RDW: 13.9 % (ref 11.5–15.5)
WBC: 11.1 10*3/uL — ABNORMAL HIGH (ref 4.0–10.5)

## 2014-12-08 LAB — PROTIME-INR
INR: 1.05 (ref 0.00–1.49)
Prothrombin Time: 13.9 seconds (ref 11.6–15.2)

## 2014-12-08 LAB — APTT: aPTT: 33 seconds (ref 24–37)

## 2014-12-14 LAB — TYPE AND SCREEN
ABO/RH(D): O POS
Antibody Screen: NEGATIVE

## 2014-12-15 ENCOUNTER — Inpatient Hospital Stay (HOSPITAL_COMMUNITY): Payer: Medicare Other | Admitting: Certified Registered Nurse Anesthetist

## 2014-12-15 ENCOUNTER — Encounter (HOSPITAL_COMMUNITY): Payer: Self-pay | Admitting: *Deleted

## 2014-12-15 ENCOUNTER — Inpatient Hospital Stay (HOSPITAL_COMMUNITY)
Admission: RE | Admit: 2014-12-15 | Discharge: 2014-12-17 | DRG: 470 | Disposition: A | Discharge status: Home Health | Payer: Medicare Other | Source: Ambulatory Visit | Attending: Orthopaedic Surgery | Admitting: Orthopaedic Surgery

## 2014-12-15 ENCOUNTER — Inpatient Hospital Stay (HOSPITAL_COMMUNITY): Payer: Medicare Other

## 2014-12-15 ENCOUNTER — Encounter (HOSPITAL_COMMUNITY)
Admission: RE | Disposition: A | Discharge status: Home Health | Payer: Self-pay | Source: Ambulatory Visit | Attending: Orthopaedic Surgery

## 2014-12-15 DIAGNOSIS — Z96652 Presence of left artificial knee joint: Secondary | ICD-10-CM

## 2014-12-15 DIAGNOSIS — F17210 Nicotine dependence, cigarettes, uncomplicated: Secondary | ICD-10-CM | POA: Diagnosis not present

## 2014-12-15 DIAGNOSIS — M25562 Pain in left knee: Secondary | ICD-10-CM | POA: Diagnosis present

## 2014-12-15 DIAGNOSIS — M1712 Unilateral primary osteoarthritis, left knee: Principal | ICD-10-CM | POA: Diagnosis present

## 2014-12-15 DIAGNOSIS — J449 Chronic obstructive pulmonary disease, unspecified: Secondary | ICD-10-CM | POA: Diagnosis present

## 2014-12-15 DIAGNOSIS — Z96642 Presence of left artificial hip joint: Secondary | ICD-10-CM | POA: Diagnosis present

## 2014-12-15 DIAGNOSIS — Z01812 Encounter for preprocedural laboratory examination: Secondary | ICD-10-CM | POA: Diagnosis not present

## 2014-12-15 DIAGNOSIS — M659 Synovitis and tenosynovitis, unspecified: Secondary | ICD-10-CM | POA: Diagnosis present

## 2014-12-15 HISTORY — PX: TOTAL KNEE ARTHROPLASTY: SHX125

## 2014-12-15 SURGERY — ARTHROPLASTY, KNEE, TOTAL
Anesthesia: General | Site: Knee | Laterality: Left

## 2014-12-15 MED ORDER — ESMOLOL HCL 10 MG/ML IV SOLN
INTRAVENOUS | Status: DC | PRN
  Administered 2014-12-15: 20 mg via INTRAVENOUS

## 2014-12-15 MED ORDER — OXYCODONE HCL 5 MG PO TABS: 5.0000 mg | ORAL_TABLET | Freq: Once | ORAL | Status: DC | PRN

## 2014-12-15 MED ORDER — PHENOL 1.4 % MT LIQD
1.0000 | OROMUCOSAL | Status: DC | PRN
Start: 2014-12-15 — End: 2014-12-17
  Filled 2014-12-15: qty 177

## 2014-12-15 MED ORDER — METOCLOPRAMIDE HCL 5 MG/ML IJ SOLN: 5.0000 mg | Freq: Three times a day (TID) | INTRAMUSCULAR | Status: DC | PRN

## 2014-12-15 MED ORDER — FENTANYL CITRATE (PF) 100 MCG/2ML IJ SOLN
INTRAMUSCULAR | Status: DC | PRN
  Administered 2014-12-15 (×5): 50 ug via INTRAVENOUS

## 2014-12-15 MED ORDER — LIDOCAINE HCL (CARDIAC) 20 MG/ML IV SOLN
INTRAVENOUS | Status: DC | PRN
  Administered 2014-12-15: 100 mg via INTRAVENOUS

## 2014-12-15 MED ORDER — CEFAZOLIN SODIUM-DEXTROSE 2-3 GM-% IV SOLR
2.0000 g | INTRAVENOUS | Status: AC
  Administered 2014-12-15: 2 g via INTRAVENOUS

## 2014-12-15 MED ORDER — TRANEXAMIC ACID 1000 MG/10ML IV SOLN
1000.0000 mg | INTRAVENOUS | Status: AC
  Administered 2014-12-15: 1000 mg via INTRAVENOUS
  Filled 2014-12-15: qty 10

## 2014-12-15 MED ORDER — MENTHOL 3 MG MT LOZG
1.0000 | LOZENGE | OROMUCOSAL | Status: DC | PRN
  Filled 2014-12-15: qty 9

## 2014-12-15 MED ORDER — METHOCARBAMOL 500 MG PO TABS
500.0000 mg | ORAL_TABLET | Freq: Four times a day (QID) | ORAL | Status: DC | PRN
  Administered 2014-12-16 – 2014-12-17 (×5): 500 mg via ORAL
  Filled 2014-12-15 (×5): qty 1

## 2014-12-15 MED ORDER — ALUM & MAG HYDROXIDE-SIMETH 200-200-20 MG/5ML PO SUSP
30.0000 mL | ORAL | Status: DC | PRN
Start: 2014-12-15 — End: 2014-12-17

## 2014-12-15 MED ORDER — BUPIVACAINE LIPOSOME 1.3 % IJ SUSP
20.0000 mL | Freq: Once | INTRAMUSCULAR | Status: AC
  Administered 2014-12-15: 20 mL
  Filled 2014-12-15: qty 20

## 2014-12-15 MED ORDER — HYDROMORPHONE HCL 1 MG/ML IJ SOLN
INTRAMUSCULAR | Status: DC | PRN
  Administered 2014-12-15 (×2): 0.5 mg via INTRAVENOUS
  Administered 2014-12-15: 1 mg via INTRAVENOUS

## 2014-12-15 MED ORDER — ROCURONIUM BROMIDE 100 MG/10ML IV SOLN
INTRAVENOUS | Status: AC
  Filled 2014-12-15: qty 1

## 2014-12-15 MED ORDER — MIDAZOLAM HCL 5 MG/5ML IJ SOLN
INTRAMUSCULAR | Status: DC | PRN
  Administered 2014-12-15: 2 mg via INTRAVENOUS

## 2014-12-15 MED ORDER — ROCURONIUM BROMIDE 100 MG/10ML IV SOLN
INTRAVENOUS | Status: DC | PRN
  Administered 2014-12-15: 40 mg via INTRAVENOUS

## 2014-12-15 MED ORDER — NEOSTIGMINE METHYLSULFATE 10 MG/10ML IV SOLN
INTRAVENOUS | Status: DC | PRN
  Administered 2014-12-15: 5 mg via INTRAVENOUS

## 2014-12-15 MED ORDER — CEFAZOLIN SODIUM-DEXTROSE 2-3 GM-% IV SOLR
INTRAVENOUS | Status: AC
  Filled 2014-12-15: qty 50

## 2014-12-15 MED ORDER — SUCCINYLCHOLINE CHLORIDE 20 MG/ML IJ SOLN
INTRAMUSCULAR | Status: DC | PRN
  Administered 2014-12-15: 100 mg via INTRAVENOUS

## 2014-12-15 MED ORDER — GLYCOPYRROLATE 0.2 MG/ML IJ SOLN
INTRAMUSCULAR | Status: DC | PRN
  Administered 2014-12-15: 0.6 mg via INTRAVENOUS

## 2014-12-15 MED ORDER — PROMETHAZINE HCL 25 MG/ML IJ SOLN
12.5000 mg | Freq: Four times a day (QID) | INTRAMUSCULAR | Status: DC | PRN
  Administered 2014-12-15: 12.5 mg via INTRAVENOUS

## 2014-12-15 MED ORDER — ONDANSETRON HCL 4 MG PO TABS: 4.0000 mg | ORAL_TABLET | Freq: Four times a day (QID) | ORAL | Status: DC | PRN

## 2014-12-15 MED ORDER — SODIUM CHLORIDE 0.9 % IR SOLN
Status: DC | PRN
  Administered 2014-12-15: 2000 mL

## 2014-12-15 MED ORDER — ESMOLOL HCL 10 MG/ML IV SOLN
INTRAVENOUS | Status: AC
  Filled 2014-12-15: qty 10

## 2014-12-15 MED ORDER — ONDANSETRON HCL 4 MG/2ML IJ SOLN: 4.0000 mg | Freq: Four times a day (QID) | INTRAMUSCULAR | Status: DC | PRN

## 2014-12-15 MED ORDER — HYDROMORPHONE HCL 1 MG/ML IJ SOLN
1.0000 mg | INTRAMUSCULAR | Status: DC | PRN
  Administered 2014-12-15: 1 mg via INTRAVENOUS
  Filled 2014-12-15: qty 1

## 2014-12-15 MED ORDER — CEFAZOLIN SODIUM 1-5 GM-% IV SOLN
1.0000 g | Freq: Four times a day (QID) | INTRAVENOUS | Status: AC
  Administered 2014-12-15 – 2014-12-16 (×2): 1 g via INTRAVENOUS
  Filled 2014-12-15 (×2): qty 50

## 2014-12-15 MED ORDER — SODIUM CHLORIDE 0.9 % IJ SOLN
INTRAMUSCULAR | Status: DC | PRN
  Administered 2014-12-15: 40 mL

## 2014-12-15 MED ORDER — PROPOFOL 10 MG/ML IV BOLUS
INTRAVENOUS | Status: AC
  Filled 2014-12-15: qty 20

## 2014-12-15 MED ORDER — SODIUM CHLORIDE 0.9 % IJ SOLN
INTRAMUSCULAR | Status: AC
  Filled 2014-12-15: qty 50

## 2014-12-15 MED ORDER — HYDROMORPHONE HCL 1 MG/ML IJ SOLN
0.2500 mg | INTRAMUSCULAR | Status: DC | PRN
  Administered 2014-12-15 (×2): 0.5 mg via INTRAVENOUS

## 2014-12-15 MED ORDER — FENTANYL CITRATE (PF) 250 MCG/5ML IJ SOLN
INTRAMUSCULAR | Status: AC
  Filled 2014-12-15: qty 25

## 2014-12-15 MED ORDER — PROPOFOL 10 MG/ML IV BOLUS
INTRAVENOUS | Status: DC | PRN
  Administered 2014-12-15: 200 mg via INTRAVENOUS

## 2014-12-15 MED ORDER — DIPHENHYDRAMINE HCL 12.5 MG/5ML PO ELIX: 12.5000 mg | ORAL_SOLUTION | ORAL | Status: DC | PRN

## 2014-12-15 MED ORDER — MIDAZOLAM HCL 2 MG/2ML IJ SOLN
INTRAMUSCULAR | Status: AC
  Filled 2014-12-15: qty 4

## 2014-12-15 MED ORDER — LACTATED RINGERS IV SOLN
INTRAVENOUS | Status: DC
  Administered 2014-12-15: 1000 mL via INTRAVENOUS
  Administered 2014-12-15: 16:00:00 via INTRAVENOUS

## 2014-12-15 MED ORDER — OXYCODONE HCL 5 MG/5ML PO SOLN
5.0000 mg | Freq: Once | ORAL | Status: DC | PRN
  Filled 2014-12-15: qty 5

## 2014-12-15 MED ORDER — PROMETHAZINE HCL 25 MG/ML IJ SOLN
INTRAMUSCULAR | Status: AC
  Filled 2014-12-15: qty 1

## 2014-12-15 MED ORDER — SODIUM CHLORIDE 0.9 % IV SOLN
INTRAVENOUS | Status: DC
  Administered 2014-12-16: 1000 mL via INTRAVENOUS

## 2014-12-15 MED ORDER — ACETAMINOPHEN 325 MG PO TABS: 650.0000 mg | ORAL_TABLET | Freq: Four times a day (QID) | ORAL | Status: DC | PRN

## 2014-12-15 MED ORDER — PROMETHAZINE HCL 25 MG PO TABS
12.5000 mg | ORAL_TABLET | Freq: Four times a day (QID) | ORAL | Status: DC | PRN
  Filled 2014-12-15: qty 1

## 2014-12-15 MED ORDER — KETOROLAC TROMETHAMINE 15 MG/ML IJ SOLN
7.5000 mg | Freq: Four times a day (QID) | INTRAMUSCULAR | Status: AC
  Administered 2014-12-16 (×3): 7.5 mg via INTRAVENOUS
  Filled 2014-12-15 (×3): qty 1

## 2014-12-15 MED ORDER — OXYCODONE HCL 5 MG PO TABS
5.0000 mg | ORAL_TABLET | ORAL | Status: DC | PRN
  Administered 2014-12-15 – 2014-12-17 (×8): 10 mg via ORAL
  Filled 2014-12-15 (×5): qty 2
  Filled 2014-12-15: qty 1
  Filled 2014-12-15 (×3): qty 2

## 2014-12-15 MED ORDER — DOCUSATE SODIUM 100 MG PO CAPS
100.0000 mg | ORAL_CAPSULE | Freq: Two times a day (BID) | ORAL | Status: DC
  Administered 2014-12-15 – 2014-12-17 (×4): 100 mg via ORAL
  Filled 2014-12-15 (×4): qty 1

## 2014-12-15 MED ORDER — ASPIRIN EC 325 MG PO TBEC
325.0000 mg | DELAYED_RELEASE_TABLET | Freq: Two times a day (BID) | ORAL | Status: DC
  Administered 2014-12-15 – 2014-12-17 (×4): 325 mg via ORAL
  Filled 2014-12-15 (×6): qty 1

## 2014-12-15 MED ORDER — ONDANSETRON HCL 4 MG/2ML IJ SOLN
INTRAMUSCULAR | Status: DC | PRN
Start: 2014-12-15 — End: 2014-12-15
  Administered 2014-12-15: 4 mg via INTRAVENOUS

## 2014-12-15 MED ORDER — 0.9 % SODIUM CHLORIDE (POUR BTL) OPTIME
TOPICAL | Status: DC | PRN
  Administered 2014-12-15: 1000 mL

## 2014-12-15 MED ORDER — ONDANSETRON HCL 4 MG/2ML IJ SOLN
INTRAMUSCULAR | Status: AC
  Filled 2014-12-15: qty 2

## 2014-12-15 MED ORDER — HYDROMORPHONE HCL 2 MG/ML IJ SOLN
INTRAMUSCULAR | Status: AC
  Filled 2014-12-15: qty 1

## 2014-12-15 MED ORDER — HYDROMORPHONE HCL 1 MG/ML IJ SOLN
INTRAMUSCULAR | Status: AC
  Filled 2014-12-15: qty 1

## 2014-12-15 MED ORDER — METOCLOPRAMIDE HCL 10 MG PO TABS: 5.0000 mg | ORAL_TABLET | Freq: Three times a day (TID) | ORAL | Status: DC | PRN

## 2014-12-15 MED ORDER — NEOSTIGMINE METHYLSULFATE 10 MG/10ML IV SOLN
INTRAVENOUS | Status: AC
  Filled 2014-12-15: qty 1

## 2014-12-15 MED ORDER — DEXAMETHASONE SODIUM PHOSPHATE 10 MG/ML IJ SOLN
INTRAMUSCULAR | Status: DC | PRN
  Administered 2014-12-15: 10 mg via INTRAVENOUS

## 2014-12-15 MED ORDER — TRAMADOL HCL 50 MG PO TABS
100.0000 mg | ORAL_TABLET | Freq: Four times a day (QID) | ORAL | Status: DC | PRN
  Administered 2014-12-16: 100 mg via ORAL
  Filled 2014-12-15: qty 2

## 2014-12-15 MED ORDER — GLYCOPYRROLATE 0.2 MG/ML IJ SOLN
INTRAMUSCULAR | Status: AC
  Filled 2014-12-15: qty 3

## 2014-12-15 MED ORDER — ACETAMINOPHEN 650 MG RE SUPP: 650.0000 mg | Freq: Four times a day (QID) | RECTAL | Status: DC | PRN

## 2014-12-15 MED ORDER — METHOCARBAMOL 1000 MG/10ML IJ SOLN
500.0000 mg | Freq: Four times a day (QID) | INTRAVENOUS | Status: DC | PRN
  Filled 2014-12-15: qty 5

## 2014-12-15 MED ORDER — LIDOCAINE HCL (CARDIAC) 20 MG/ML IV SOLN
INTRAVENOUS | Status: AC
  Filled 2014-12-15: qty 5

## 2014-12-15 MED ORDER — ZOLPIDEM TARTRATE 5 MG PO TABS
5.0000 mg | ORAL_TABLET | Freq: Every evening | ORAL | Status: DC | PRN
  Administered 2014-12-16: 5 mg via ORAL
  Filled 2014-12-15: qty 1

## 2014-12-15 SURGICAL SUPPLY — 60 items
APL SKNCLS STERI-STRIP NONHPOA (GAUZE/BANDAGES/DRESSINGS) ×1
BANDAGE ELASTIC 6 VELCRO ST LF (GAUZE/BANDAGES/DRESSINGS) ×2 IMPLANT
BANDAGE ESMARK 6X9 LF (GAUZE/BANDAGES/DRESSINGS) ×1 IMPLANT
BENZOIN TINCTURE PRP APPL 2/3 (GAUZE/BANDAGES/DRESSINGS) ×1 IMPLANT
BLADE SAG 13.0X1.37X90 (BLADE) IMPLANT
BLADE SAG 18X100X1.27 (BLADE) ×1 IMPLANT
BNDG CMPR 9X6 STRL LF SNTH (GAUZE/BANDAGES/DRESSINGS) ×1
BNDG ESMARK 6X9 LF (GAUZE/BANDAGES/DRESSINGS) ×2
BOWL SMART MIX CTS (DISPOSABLE) ×2 IMPLANT
CAPT KNEE TOTAL 3 ×1 IMPLANT
CEMENT BONE 1-PACK (Cement) ×4 IMPLANT
CHLORAPREP W/TINT 26ML (MISCELLANEOUS) ×2 IMPLANT
CUFF TOURN SGL QUICK 34 (TOURNIQUET CUFF) ×2
CUFF TRNQT CYL 34X4X40X1 (TOURNIQUET CUFF) ×1 IMPLANT
DRAPE EXTREMITY T 121X128X90 (DRAPE) ×2 IMPLANT
DRAPE POUCH INSTRU U-SHP 10X18 (DRAPES) ×2 IMPLANT
DRAPE U-SHAPE 47X51 STRL (DRAPES) ×2 IMPLANT
DRSG AQUACEL AG ADV 3.5X10 (GAUZE/BANDAGES/DRESSINGS) ×2 IMPLANT
DRSG PAD ABDOMINAL 8X10 ST (GAUZE/BANDAGES/DRESSINGS) ×2 IMPLANT
ELECT REM PT RETURN 9FT ADLT (ELECTROSURGICAL) ×2
ELECTRODE REM PT RTRN 9FT ADLT (ELECTROSURGICAL) ×1 IMPLANT
FACESHIELD WRAPAROUND (MASK) ×8 IMPLANT
FACESHIELD WRAPAROUND OR TEAM (MASK) ×5 IMPLANT
GAUZE SPONGE 4X4 12PLY STRL (GAUZE/BANDAGES/DRESSINGS) ×2 IMPLANT
GLOVE BIO SURGEON STRL SZ7.5 (GLOVE) ×2 IMPLANT
GLOVE BIOGEL PI IND STRL 6.5 (GLOVE) IMPLANT
GLOVE BIOGEL PI IND STRL 7.0 (GLOVE) IMPLANT
GLOVE BIOGEL PI IND STRL 8 (GLOVE) ×2 IMPLANT
GLOVE BIOGEL PI INDICATOR 6.5 (GLOVE) ×2
GLOVE BIOGEL PI INDICATOR 7.0 (GLOVE) ×1
GLOVE BIOGEL PI INDICATOR 8 (GLOVE) ×2
GLOVE ECLIPSE 8.0 STRL XLNG CF (GLOVE) ×2 IMPLANT
GLOVE SURG SS PI 7.0 STRL IVOR (GLOVE) ×2 IMPLANT
GOWN STRL REUS W/TWL LRG LVL3 (GOWN DISPOSABLE) ×2 IMPLANT
GOWN STRL REUS W/TWL XL LVL3 (GOWN DISPOSABLE) ×4 IMPLANT
HANDPIECE INTERPULSE COAX TIP (DISPOSABLE) ×2
IMMOBILIZER KNEE 20 (SOFTGOODS) ×2
IMMOBILIZER KNEE 20 THIGH 36 (SOFTGOODS) ×1 IMPLANT
KIT BASIN OR (CUSTOM PROCEDURE TRAY) ×2 IMPLANT
PACK TOTAL JOINT (CUSTOM PROCEDURE TRAY) ×2 IMPLANT
PADDING CAST COTTON 6X4 STRL (CAST SUPPLIES) ×3 IMPLANT
PEN SKIN MARKING BROAD (MISCELLANEOUS) ×2 IMPLANT
POSITIONER SURGICAL ARM (MISCELLANEOUS) ×2 IMPLANT
SET HNDPC FAN SPRY TIP SCT (DISPOSABLE) ×1 IMPLANT
SET PAD KNEE POSITIONER (MISCELLANEOUS) ×2 IMPLANT
STRIP CLOSURE SKIN 1/2X4 (GAUZE/BANDAGES/DRESSINGS) ×1 IMPLANT
SUCTION FRAZIER 12FR DISP (SUCTIONS) ×2 IMPLANT
SUT MNCRL AB 4-0 PS2 18 (SUTURE) ×1 IMPLANT
SUT VIC AB 0 CT1 27 (SUTURE) ×2
SUT VIC AB 0 CT1 27XBRD ANTBC (SUTURE) ×1 IMPLANT
SUT VIC AB 1 CT1 27 (SUTURE) ×4
SUT VIC AB 1 CT1 27XBRD ANTBC (SUTURE) ×2 IMPLANT
SUT VIC AB 2-0 CT1 27 (SUTURE) ×4
SUT VIC AB 2-0 CT1 TAPERPNT 27 (SUTURE) ×2 IMPLANT
TOWEL OR 17X26 10 PK STRL BLUE (TOWEL DISPOSABLE) ×2 IMPLANT
TOWEL OR NON WOVEN STRL DISP B (DISPOSABLE) ×1 IMPLANT
TRAY FOLEY W/METER SILVER 14FR (SET/KITS/TRAYS/PACK) ×2 IMPLANT
WATER STERILE IRR 1000ML POUR (IV SOLUTION) ×1 IMPLANT
WRAP KNEE MAXI GEL POST OP (GAUZE/BANDAGES/DRESSINGS) ×2 IMPLANT
YANKAUER SUCT BULB TIP 10FT TU (MISCELLANEOUS) ×2 IMPLANT

## 2014-12-15 NOTE — H&P (Signed)
TOTAL KNEE ADMISSION H&P  Patient is being admitted for left total knee arthroplasty.  Subjective:  Chief Complaint:left knee pain.  HPI: Katrina [name], 59 y.o. female, has a history of pain and functional disability in the left knee due to arthritis and has failed non-surgical conservative treatments for greater than 12 weeks to includeNSAID's and/or analgesics, corticosteriod injections, flexibility and strengthening excercises, supervised PT with diminished ADL's post treatment, use of assistive devices, weight reduction as appropriate and activity modification.  Onset of symptoms was gradual, starting 5 years ago with gradually worsening course since that time. The patient noted prior procedures on the knee to include  arthroscopy on the left knee(s).  Patient currently rates pain in the left knee(s) at 10 out of 10 with activity. Patient has night pain, worsening of pain with activity and weight bearing, pain that interferes with activities of daily living, pain with passive range of motion, crepitus and joint swelling.  Patient has evidence of subchondral sclerosis, periarticular osteophytes and joint space narrowing by imaging studies. There is no active infection.  Patient Active Problem List   Diagnosis Date Noted  . Osteoarthritis of left knee 12/15/2014  . Hand pain, left 04/21/2014  . Cat bite of finger 04/21/2014  . Asthma 04/21/2014  . Tobacco abuse 04/21/2014  . COPD (chronic obstructive pulmonary disease)   . Effusion of left knee joint 11/03/2013  . Arthritis of right knee 12/03/2012  . Degenerative arthritis of hip 05/02/2011   Past Medical History  Diagnosis Date  . Asthma     hx of asthma 3/12- hospitalized   . Arthritis     knees, hips, elbows   . COPD (chronic obstructive pulmonary disease)     Past Surgical History  Procedure Laterality Date  . Joint replacement    . Other surgical history      arthroswcopic surgery right knee   . Other surgical history       arthroscopic surgery left knee x 2   . Other surgical history      bunionectomy right foot   . Other surgical history      C Section x 2   . Cholecystectomy    . Other surgical history      carpal tunnel on left   . Total hip arthroplasty  05/02/2011    Procedure: TOTAL HIP ARTHROPLASTY ANTERIOR APPROACH;  Surgeon: Mcarthur Rossetti, MD;  Location: [facility];  Service: Orthopedics;  Laterality: Left;  Left Total Hip Arthroplasty, Direct Anterior Approach  . Back surgery      x2  . Left elbow surgery     . Total knee arthroplasty Right 12/03/2012    Procedure: RIGHT TOTAL KNEE ARTHROPLASTY;  Surgeon: Mcarthur Rossetti, MD;  Location: [facility];  Service: Orthopedics;  Laterality: Right;  . Right foot bunionectomy     . Knee arthroscopy      bilateral x 2   . Cesarean section      x 2  . Laparoscopy    . Carpal tunnel release      left   . Ulnar nerve transposition      left   . Upper gi endoscopy      x 2  . Knee arthroscopy Left 11/03/2013    Procedure: LEFT KNEE ARTHROSCOPY WITH DEBRIDEMENT, PARTIAL SYNOVECTOMY;  Surgeon: Mcarthur Rossetti, MD;  Location: [facility];  Service: Orthopedics;  Laterality: Left;    No prescriptions prior to admission   Allergies  Allergen Reactions  .  Codeine Itching and Nausea Only    Social History  Substance Use Topics  . Smoking status: Current Every Day Smoker -- 1.00 packs/day for 25 years    Types: Cigarettes  . Smokeless tobacco: Never Used  . Alcohol Use: 0.0 oz/week     Comment: occasional beer     No family history on file.   Review of Systems  Musculoskeletal: Positive for joint pain and falls.  All other systems reviewed and are negative.   Objective:  Physical Exam  Constitutional: She is oriented to person, place, and time. She appears well-developed and well-nourished.  HENT:  Head: Normocephalic and atraumatic.  Eyes: Conjunctivae are normal. Pupils are equal, round, and reactive to light.  Neck: Normal  range of motion.  Cardiovascular: Normal rate and regular rhythm.   Respiratory: Effort normal and breath sounds normal.  GI: Soft. Bowel sounds are normal.  Musculoskeletal:       Left knee: She exhibits decreased range of motion, swelling and effusion. Tenderness found. Medial joint line and lateral joint line tenderness noted.  Neurological: She is alert and oriented to person, place, and time.  Skin: Skin is warm and dry.  Psychiatric: She has a normal mood and affect.    Vital signs in last 24 hours:    Labs:   Estimated body mass index is 37.58 kg/(m^2) as calculated from the following:   Height as of 04/21/14: 5\' 7"  (1.702 m).   Weight as of 04/21/14: 108.863 kg (240 lb).   Imaging Review Plain radiographs demonstrate moderate degenerative joint disease of the left knee(s). The overall alignment isneutral. The bone quality appears to be excellent for age and reported activity level.  Assessment/Plan:  End stage arthritis, left knee   The patient history, physical examination, clinical judgment of the provider and imaging studies are consistent with end stage degenerative joint disease of the left knee(s) and total knee arthroplasty is deemed medically necessary. The treatment options including medical management, injection therapy arthroscopy and arthroplasty were discussed at length. The risks and benefits of total knee arthroplasty were presented and reviewed. The risks due to aseptic loosening, infection, stiffness, patella tracking problems, thromboembolic complications and other imponderables were discussed. The patient acknowledged the explanation, agreed to proceed with the plan and consent was signed. Patient is being admitted for inpatient treatment for surgery, pain control, PT, OT, prophylactic antibiotics, VTE prophylaxis, progressive ambulation and ADL's and discharge planning. The patient is planning to be discharged home with home health services

## 2014-12-15 NOTE — Brief Op Note (Signed)
12/15/2014  4:17 PM  PATIENT:  Arnoldo Morale  59 y.o. female  PRE-OPERATIVE DIAGNOSIS:  Osteoarthritis left knee  POST-OPERATIVE DIAGNOSIS:  Osteoarthritis left knee  PROCEDURE:  Procedure(s): LEFT TOTAL KNEE ARTHROPLASTY (Left)  SURGEON:  Surgeon(s) and Role:    * Mcarthur Rossetti, MD - Primary  PHYSICIAN ASSISTANT: Benita Stabile, PA-C  ANESTHESIA:   local and general  EBL:  Total I/O In: 1000 [I.V.:1000] Out: 500 [Urine:400; Blood:100]  BLOOD ADMINISTERED:none  DRAINS: none   LOCAL MEDICATIONS USED:  OTHER Experil  SPECIMEN:  No Specimen  DISPOSITION OF SPECIMEN:  N/A  COUNTS:  YES  TOURNIQUET:   Total Tourniquet Time Documented: Thigh (Left) - 45 minutes Total: Thigh (Left) - 45 minutes   DICTATION: .Other Dictation: Dictation Number [id number]  PLAN OF CARE: Admit to inpatient   PATIENT DISPOSITION:  PACU - hemodynamically stable.   Delay start of Pharmacological VTE agent (>24hrs) due to surgical blood loss or risk of bleeding: no

## 2014-12-15 NOTE — Transfer of Care (Signed)
Immediate Anesthesia Transfer of Care Note  Patient: Katrina [name]  Procedure(s) Performed: Procedure(s): LEFT TOTAL KNEE ARTHROPLASTY (Left)  Patient Location: PACU  Anesthesia Type:General  Level of Consciousness:  sedated, patient cooperative and responds to stimulation  Airway & Oxygen Therapy:Patient Spontanous Breathing and Patient connected to face mask oxgen  Post-op Assessment:  Report given to PACU RN and Post -op Vital signs reviewed and stable  Post vital signs:  Reviewed and stable  Last Vitals:  Filed Vitals:   12/15/14 1223  BP: 157/89  Pulse: 96  Temp: 36.7 C  Resp: 16    Complications: No apparent anesthesia complications

## 2014-12-15 NOTE — Anesthesia Postprocedure Evaluation (Signed)
Anesthesia Post Note  Patient: Katrina [name]  Procedure(s) Performed: Procedure(s) (LRB): LEFT TOTAL KNEE ARTHROPLASTY (Left)  Anesthesia type: General  Patient location: PACU  Post pain: Pain level controlled and Adequate analgesia  Post assessment: Post-op Vital signs reviewed, Patient's Cardiovascular Status Stable, Respiratory Function Stable, Patent Airway and Pain level controlled  Last Vitals:  Filed Vitals:   12/15/14 1730  BP: 142/73  Pulse: 95  Temp:   Resp: 10    Post vital signs: Reviewed and stable  Level of consciousness: awake, alert  and oriented  Complications: No apparent anesthesia complications

## 2014-12-15 NOTE — Plan of Care (Signed)
Problem: Consults Goal: Diagnosis- Total Joint Replacement Primary Total Knee LEFT     

## 2014-12-15 NOTE — Anesthesia Preprocedure Evaluation (Signed)
Anesthesia Evaluation  Patient identified by MRN, date of birth, ID band Patient awake    Reviewed: Allergy & Precautions, NPO status , Patient's Chart, lab work & pertinent test results  Airway Mallampati: II   Neck ROM: full    Dental   Pulmonary asthma , COPD, Current Smoker,    breath sounds clear to auscultation       Cardiovascular negative cardio ROS   Rhythm:regular Rate:Normal     Neuro/Psych    GI/Hepatic   Endo/Other  obese  Renal/GU      Musculoskeletal  (+) Arthritis ,   Abdominal   Peds  Hematology   Anesthesia Other Findings   Reproductive/Obstetrics                             Anesthesia Physical Anesthesia Plan  ASA: II  Anesthesia Plan: General   Post-op Pain Management:    Induction: Intravenous  Airway Management Planned: Oral ETT  Additional Equipment:   Intra-op Plan:   Post-operative Plan: Extubation in OR  Informed Consent: I have reviewed the patients History and Physical, chart, labs and discussed the procedure including the risks, benefits and alternatives for the proposed anesthesia with the patient or authorized representative who has indicated his/her understanding and acceptance.     Plan Discussed with: CRNA, Anesthesiologist and Surgeon  Anesthesia Plan Comments:         Anesthesia Quick Evaluation

## 2014-12-16 MED ORDER — TIZANIDINE HCL 4 MG PO TABS
4.0000 mg | ORAL_TABLET | Freq: Four times a day (QID) | ORAL | Status: DC | PRN
Start: 2014-12-16 — End: 2017-12-01

## 2014-12-16 MED ORDER — OXYCODONE-ACETAMINOPHEN 5-325 MG PO TABS: 1.0000 | ORAL_TABLET | ORAL | Status: DC | PRN

## 2014-12-16 MED ORDER — PROMETHAZINE HCL 12.5 MG PO TABS: 12.5000 mg | ORAL_TABLET | Freq: Four times a day (QID) | ORAL | Status: DC | PRN

## 2014-12-16 MED ORDER — ASPIRIN 325 MG PO TBEC
325.0000 mg | DELAYED_RELEASE_TABLET | Freq: Two times a day (BID) | ORAL | Status: DC
Start: 2014-12-16 — End: 2017-12-01

## 2014-12-16 NOTE — Op Note (Signed)
NAMEMarland [name]  BRITNI, DRISCOLL NO.:  [id number]  MEDICAL RECORD NO.:  [id number]  LOCATION:  31                         FACILITY:  [facility]  PHYSICIAN:  Lind Guest. Ninfa Linden, M.D.DATE OF BIRTH:  [birth year]  DATE OF PROCEDURE:  12/15/2014 DATE OF DISCHARGE:                              OPERATIVE REPORT   PREOPERATIVE DIAGNOSIS:  Moderate arthritis with degenerative joint disease, recurrence of effusions and synovitis of left knee.  POSTOPERATIVE DIAGNOSIS:  Moderate arthritis with degenerative joint disease, recurrence of effusions and synovitis of left knee.  PROCEDURE:  Left total knee arthroplasty.  IMPLANTS:  Stryker Triathlon knee with size 3 femur, size 3 tibial tray, 9-mm fix bearing polyethylene insert, size 29 patellar button.  SURGEON:  Lind Guest. Ninfa Linden, M.D.  ASSISTANT:  Erskine Emery, PA-C.  ANESTHESIA: 1. General. 2. Local with Exparel.  TOURNIQUET TIME:  Less than 1 hour.  BLOOD LOSS:  Less than 100 mL.  COMPLICATIONS:  None.  INDICATIONS:  Ms. Katrina [name] is a very pleasant 59 year old female, well known to me.  We previously performed a total hip arthroplasty on her and a right total knee arthroplasty.  Her left knee has been problematic for several years now.  I performed at least 2 arthroscopic interventions on that knee.  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  Our next recommendation is a total knee arthroplasty.  She has tried multiple injections of steroid in her knee as well as hyaluronic acid, and all these things had failed.  PROCEDURE DESCRIPTION:  After informed consent obtained, appropriate left knee was marked.  She was brought to the operating room, and general anesthesia was obtained.  A nonsterile tourniquet was placed around her upper left leg, and a Foley catheter was placed as well.  Her left leg was then prepped and  draped with DuraPrep and sterile drapes including a sterile stockinette.  A time-out was called to identify correct patient, correct left knee.  We then used an Esmarch to wrap out the leg, and tourniquet was inflated to 300 mm of pressure.  I then made a direct midline incision over the patella and carried this proximally and distally.  I dissected down to the knee joint and carried out a medial parapatellar arthrotomy.  I then obtained a large effusion from the knee and cleaned the knee from synovium and synovitis throughout the knee.  We then had the knee in a flexed position and removed the ACL, PCL, and remnants of the medial and lateral meniscus.  I used an extramedullary cutting guide for the tibia and set our tibial cutting guide for taking 9 mm off the high side correcting for neutral slope and correcting varus or valgus and made this cut without difficulty.  We then used an intramedullary guide for distal femoral cut, setting this to take a distal femoral cut of 8.5 mm.  After distal femoral cut was made by self, 5 degrees external rotated the left knee.  We brought the knee back down to full extension after making our tibial cut and our distal femoral cut with a 9-mm extension block.  She had full extension  and stability with varus and valgus stressing.  With back to her femur, put our femoral sizing guide and chose a size 3 femur.  This was based off the epicondylar axis.  We then made our 4-in-1 cut using a size 3 cutting block.  We then made our femoral box cut.  I went back to the tibia and sized for a size 3 tibia and made our keel cut off this.  With the trial tibia size 3 and placed the trial size 3 femur, we trialed a 9- mm polyethylene insert and we were pleased with the range of motion and stability.  We then made our patella cut for a size 29 and drilled holes for size 29 patellar button.  With all trial components in place, again we were pleased with stability and  range of motion.  We then removed all trial components and irrigated the knee with normal saline solution using pulsatile lavage.  We then used electrocautery on the back of the knee for hemostasis and then inserted a mixture of 20 mL of Exparel and 40 mL of saline mixture throughout the arthrotomy.  We then mixed our cement and cemented the real Stryker tibial tray size 3, followed by the real size 3 femur, we placed the real 9-mm fix bearing polyethylene insert and cemented the patellar button.  Once our cement had hardened and dried with the tourniquet down and hemostasis was obtained with electrocautery.  We then irrigated the knee again with normal saline solution using pulsatile lavage.  We closed the arthrotomy with interrupted #1 Vicryl suture, followed by 0 Vicryl in the deep tissue direct, 2-0 Vicryl in subcutaneous tissue, 4-0 Monocryl subcuticular stitch, and Steri-Strips on the skin.  Well-padded sterile dressing was applied.  She was awakened, extubated, and taken to the recovery room in stable condition.  All final counts correct.  No complications noted. Of note, Erskine Emery, PA-C, assisted in entire case as his was crucial facilitating all aspects of this case.     Lind Guest. Ninfa Linden, M.D.     CYB/MEDQ  D:  12/15/2014  T:  12/16/2014  Job:  [id number]

## 2014-12-16 NOTE — Progress Notes (Signed)
Physical Therapy Treatment Patient Details Name: Katrina [name] MRN: [id number] DOB: 1956/04/01 Today's Date: 12/16/2014    History of Present Illness L TKR; s/p R TKR     PT Comments    Pt with better pain control and agreeable to therex  Follow Up Recommendations  Home health PT     Equipment Recommendations  3in1 (PT)    Recommendations for Other Services OT consult     Precautions / Restrictions Precautions Precautions: Knee;Fall Required Braces or Orthoses: Knee Immobilizer - Left Knee Immobilizer - Left: Discontinue once straight leg raise with < 10 degree lag Restrictions Weight Bearing Restrictions: No Other Position/Activity Restrictions: WBAT    Mobility  Bed Mobility Overal bed mobility: Needs Assistance Bed Mobility: Supine to Sit     Supine to sit: Mod assist     General bed mobility comments: cues for sequence and use of R LE to self assist  Transfers Overall transfer level: Needs assistance Equipment used: Rolling walker (2 wheeled) Transfers: Sit to/from Stand Sit to Stand: Min assist;Mod assist         General transfer comment: cues for LE management and use of UEs to self assist  Ambulation/Gait Ambulation/Gait assistance: Min assist;Mod assist Ambulation Distance (Feet): 35 Feet Assistive device: Rolling walker (2 wheeled) Gait Pattern/deviations: Step-to pattern;Decreased step length - right;Decreased step length - left;Shuffle;Trunk flexed Gait velocity: decr   General Gait Details: cues for sequence, posture and position from [employer]            Wheelchair Mobility    Modified Rankin (Stroke Patients Only)       Balance                                    Cognition Arousal/Alertness: Awake/alert Behavior During Therapy: WFL for tasks assessed/performed Overall Cognitive Status: Within Functional Limits for tasks assessed                      Exercises Total Joint Exercises Ankle  Circles/Pumps: AROM;15 reps;Supine;Both Quad Sets: AROM;10 reps;Supine;Both Heel Slides: AAROM;Left;15 reps;Supine Straight Leg Raises: AAROM;10 reps;Left;Supine Goniometric ROM: AAROM at L knee -10 - 40    General Comments        Pertinent Vitals/Pain Pain Assessment: 0-10 Pain Score: 6  Pain Location: L knee/thigh Pain Descriptors / Indicators: Aching;Sore Pain Intervention(s): Limited activity within patient's tolerance;Monitored during session;Premedicated before session;Ice applied    Home Living Family/patient expects to be discharged to:: Private residence Living Arrangements: Spouse/significant other Available Help at Discharge: Family Type of Home: House Home Access: Stairs to enter Entrance Stairs-Rails: None Home Layout: One level Home Equipment: Environmental consultant - 2 wheels;Cane - single point;Crutches;Shower seat - built in      Prior Function Level of Independence: Independent          PT Goals (current goals can now be found in the care plan section) Acute Rehab PT Goals Patient Stated Goal: Resume previous lifestyle with decreased pain PT Goal Formulation: With patient Time For Goal Achievement: 12/19/14 Potential to Achieve Goals: Good Progress towards PT goals: Progressing toward goals    Frequency  7X/week    PT Plan Current plan remains appropriate    Co-evaluation             End of Session Equipment Utilized During Treatment: Gait belt;Left knee immobilizer Activity Tolerance: Patient tolerated treatment well Patient left: in chair;with  call bell/phone within reach     Time: 1027-1046 PT Time Calculation (min) (ACUTE ONLY): 19 min  Charges:  $Gait Training: 8-22 mins $Therapeutic Exercise: 8-22 mins                    G Codes:      BRADSHAW,HUNTER 01-03-15, 1:10 PM

## 2014-12-16 NOTE — Progress Notes (Signed)
Physical Therapy Treatment Patient Details Name: Katrina [name] MRN: [id number] DOB: 1955/11/29 Today's Date: 12/16/2014    History of Present Illness Pt admitted for L TKR with history of R TKR     PT Comments    Pt progressing well with mobility and hopeful for dc tomorrow.  Follow Up Recommendations  Home health PT     Equipment Recommendations  3in1 (PT)    Recommendations for Other Services OT consult     Precautions / Restrictions Precautions Precautions: Knee;Fall Required Braces or Orthoses: Knee Immobilizer - Left Knee Immobilizer - Left: Discontinue once straight leg raise with < 10 degree lag Restrictions Weight Bearing Restrictions: No Other Position/Activity Restrictions: WBAT    Mobility  Bed Mobility Overal bed mobility: Needs Assistance Bed Mobility: Sit to Supine       Sit to supine: Min assist   General bed mobility comments: for LEs onto bed. cues for sequence.   Transfers Overall transfer level: Needs assistance Equipment used: Rolling walker (2 wheeled) Transfers: Sit to/from Stand Sit to Stand: Min assist         General transfer comment: cues for hand placement and LE management.  Ambulation/Gait Ambulation/Gait assistance: Min assist;Min guard Ambulation Distance (Feet): 123 Feet Assistive device: Rolling walker (2 wheeled) Gait Pattern/deviations: Step-to pattern;Step-through pattern;Decreased step length - right;Decreased step length - left;Shuffle;Trunk flexed     General Gait Details: cues for sequence, posture, position from RW, and to slow down for [employer]   Stairs            Wheelchair Mobility    Modified Rankin (Stroke Patients Only)       Balance                                    Cognition Arousal/Alertness: Awake/alert (alittle groggy toward the end of session.) Behavior During Therapy: WFL for tasks assessed/performed Overall Cognitive Status: Within Functional Limits for tasks  assessed                      Exercises      General Comments        Pertinent Vitals/Pain Pain Assessment: 0-10 Pain Score: 1  Pain Location: L knee Pain Descriptors / Indicators: Sore Pain Intervention(s): Repositioned;Monitored during session    Home Living Family/patient expects to be discharged to:: Private residence Living Arrangements: Spouse/significant other Available Help at Discharge: Family Type of Home: House Home Access: Stairs to enter Entrance Stairs-Rails: None Home Layout: One level Home Equipment: Environmental consultant - 2 wheels;Cane - single point;Crutches;Shower seat - built in      Prior Function Level of Independence: Independent          PT Goals (current goals can now be found in the care plan section) Acute Rehab PT Goals Patient Stated Goal: return to independence.  PT Goal Formulation: With patient Time For Goal Achievement: 12/19/14 Potential to Achieve Goals: Good Progress towards PT goals: Progressing toward goals    Frequency  7X/week    PT Plan Current plan remains appropriate    Co-evaluation             End of Session Equipment Utilized During Treatment: Gait belt;Left knee immobilizer Activity Tolerance: Patient tolerated treatment well Patient left: Other (comment) (bathroom with OT)     Time: 0973-5329 PT Time Calculation (min) (ACUTE ONLY): 10 min  Charges:  $Gait Training: 8-22 mins  G Codes:      Shep Porter 2015/01/12, 5:24 PM

## 2014-12-16 NOTE — Progress Notes (Signed)
Subjective: 1 Day Post-Op Procedure(s) (LRB): LEFT TOTAL KNEE ARTHROPLASTY (Left) Patient reports pain as moderate.    Objective: Vital signs in last 24 hours: Temp:  [97.4 F (36.3 [name])-98.1 F (36.7 [name])] 97.7 F (36.5 [name]) (09/10 0623) Pulse Rate:  [66-101] 77 (09/10 0623) Resp:  [9-23] 16 (09/10 0623) BP: (97-167)/(57-111) 149/76 mmHg (09/10 0623) SpO2:  [94 %-99 %] 98 % (09/10 0623) Weight:  [105.688 kg (233 lb)] 105.688 kg (233 lb) (09/09 1217)  Intake/Output from previous day: 09/09 0701 - 09/10 0700 In: 2910 [P.O.:960; I.V.:1900; IV Piggyback:50] Out: 4496 [Urine:1750; Blood:100] Intake/Output this shift:    No results for input(s): HGB in the last 72 hours. No results for input(s): WBC, RBC, HCT, PLT in the last 72 hours. No results for input(s): NA, K, CL, CO2, BUN, CREATININE, GLUCOSE, CALCIUM in the last 72 hours. No results for input(s): LABPT, INR in the last 72 hours.  Neurologically intact  Assessment/Plan: 1 Day Post-Op Procedure(s) (LRB): LEFT TOTAL KNEE ARTHROPLASTY (Left) Up with therapy saline lock IV, foley out .  Katrina [name] 12/16/2014, 8:04 AM

## 2014-12-16 NOTE — Evaluation (Signed)
Physical Therapy Evaluation Patient Details Name: Katrina [name] MRN: [id number] DOB: 01-16-1956 Today's Date: 12/16/2014   History of Present Illness  L TKR; s/p R TKR   Clinical Impression  Pt s/p L TKR presents with decreased L LE strength/ROM and post op pain limiting functional mobility.  Pt should progress to dc home with family assist and HHPT follow up.    Follow Up Recommendations Home health PT    Equipment Recommendations  3in1 (PT)    Recommendations for Other Services OT consult     Precautions / Restrictions Precautions Precautions: Knee;Fall Required Braces or Orthoses: Knee Immobilizer - Left Knee Immobilizer - Left: Discontinue once straight leg raise with < 10 degree lag Restrictions Weight Bearing Restrictions: No Other Position/Activity Restrictions: WBAT      Mobility  Bed Mobility Overal bed mobility: Needs Assistance Bed Mobility: Supine to Sit     Supine to sit: Mod assist     General bed mobility comments: cues for sequence and use of R LE to self assist  Transfers Overall transfer level: Needs assistance Equipment used: Rolling walker (2 wheeled) Transfers: Sit to/from Stand Sit to Stand: Min assist;Mod assist         General transfer comment: cues for LE management and use of UEs to self assist  Ambulation/Gait Ambulation/Gait assistance: Min assist;Mod assist Ambulation Distance (Feet): 35 Feet Assistive device: Rolling walker (2 wheeled) Gait Pattern/deviations: Step-to pattern;Decreased step length - right;Decreased step length - left;Shuffle;Trunk flexed Gait velocity: decr   General Gait Details: cues for sequence, posture and position from [employer]            Wheelchair Mobility    Modified Rankin (Stroke Patients Only)       Balance                                             Pertinent Vitals/Pain Pain Assessment: 0-10 Pain Score: 10-Worst pain ever Pain Location: L  knee/thigh Pain Descriptors / Indicators: Aching;Burning;Sore Pain Intervention(s): Limited activity within patient's tolerance;Monitored during session;Premedicated before session;Patient requesting pain meds-RN notified;Ice applied (Requested muscle relax)    Home Living Family/patient expects to be discharged to:: Private residence Living Arrangements: Spouse/significant other Available Help at Discharge: Family Type of Home: House Home Access: Stairs to enter Entrance Stairs-Rails: None Entrance Stairs-Number of Steps: 2 Home Layout: One level Home Equipment: Environmental consultant - 2 wheels;Cane - single point;Crutches;Shower seat - built in      Prior Function Level of Independence: Independent               Hand Dominance   Dominant Hand: Right    Extremity/Trunk Assessment   Upper Extremity Assessment: Overall WFL for tasks assessed           Lower Extremity Assessment: LLE deficits/detail      Cervical / Trunk Assessment: Normal  Communication   Communication: No difficulties  Cognition Arousal/Alertness: Awake/alert Behavior During Therapy: WFL for tasks assessed/performed Overall Cognitive Status: Within Functional Limits for tasks assessed                      General Comments      Exercises Total Joint Exercises Ankle Circles/Pumps: AROM;15 reps;Supine;Both      Assessment/Plan    PT Assessment Patient needs continued PT services  PT Diagnosis Difficulty walking   PT Problem List  Decreased strength;Decreased range of motion;Decreased activity tolerance;Decreased mobility;Decreased knowledge of use of DME;Pain  PT Treatment Interventions DME instruction;Gait training;Stair training;Functional mobility training;Therapeutic activities;Therapeutic exercise;Balance training;Patient/family education   PT Goals (Current goals can be found in the Care Plan section) Acute Rehab PT Goals Patient Stated Goal: Resume previous lifestyle with decreased  pain PT Goal Formulation: With patient Time For Goal Achievement: 12/19/14 Potential to Achieve Goals: Good    Frequency 7X/week   Barriers to discharge        Co-evaluation               End of Session Equipment Utilized During Treatment: Gait belt;Left knee immobilizer Activity Tolerance: Patient limited by pain Patient left: in chair;with call bell/phone within reach Nurse Communication: Mobility status         Time: 0852-0921 PT Time Calculation (min) (ACUTE ONLY): 29 min   Charges:   PT Evaluation $Initial PT Evaluation Tier I: 1 Procedure PT Treatments $Gait Training: 8-22 mins   PT G Codes:        Kani Jobson January 11, 2015, 1:04 PM

## 2014-12-16 NOTE — Evaluation (Signed)
Occupational Therapy Evaluation Patient Details Name: Katrina [name] MRN: [id number] DOB: [birth year] Today's Date: 12/16/2014    History of Present Illness Pt admitted for L TKR with history of R TKR    Clinical Impression   Pt requires cues for safety with functional transfers into the bathroom. She tends to move quickly and also picks up the walker. Repeated cues to not pick up the walker and also go slower. Will follow on acute to progress ADL independence for d/c home with family.    Follow Up Recommendations  No OT follow up;Supervision/Assistance - 24 hour    Equipment Recommendations  3 in 1 bedside comode    Recommendations for Other Services       Precautions / Restrictions Precautions Precautions: Knee;Fall Required Braces or Orthoses: Knee Immobilizer - Left Knee Immobilizer - Left: Discontinue once straight leg raise with < 10 degree lag Restrictions Weight Bearing Restrictions: No Other Position/Activity Restrictions: WBAT      Mobility Bed Mobility Overal bed mobility: Needs Assistance Bed Mobility: Sit to Supine       Sit to supine: Min assist   General bed mobility comments: for LEs onto bed. cues for sequence.   Transfers Overall transfer level: Needs assistance Equipment used: Rolling walker (2 wheeled) Transfers: Sit to/from Stand Sit to Stand: Min assist         General transfer comment: cues for hand placement and LE management.    Balance                                            ADL Overall ADL's : Needs assistance/impaired Eating/Feeding: Independent;Sitting   Grooming: Wash/dry hands;Set up;Sitting   Upper Body Bathing: Set up;Sitting   Lower Body Bathing: Moderate assistance;Sit to/from stand   Upper Body Dressing : Set up;Sitting   Lower Body Dressing: Moderate assistance;Sit to/from stand   Toilet Transfer: Minimal assistance;Ambulation;BSC;RW   Toileting- Clothing Manipulation and Hygiene:  Minimal assistance;Sit to/from stand         General ADL Comments: Discussed at length with spouse and daughters use of KI until able to SLR and how to manage with LB dressing. Pt states daughter will assist with LB self care and she does not feel she needs AE. Pt tends to pick up the walker so cued her to make sure she rolls walker. She started to get a little groggy toward end of session.      Vision     Perception     Praxis      Pertinent Vitals/Pain Pain Assessment: 0-10 Pain Score: 1  Pain Location: L knee Pain Descriptors / Indicators: Sore Pain Intervention(s): Repositioned;Monitored during session     Hand Dominance Right   Extremity/Trunk Assessment Upper Extremity Assessment Upper Extremity Assessment: Overall WFL for tasks assessed           Communication Communication Communication: No difficulties   Cognition Arousal/Alertness: Awake/alert (alittle groggy toward the end of session.) Behavior During Therapy: WFL for tasks assessed/performed Overall Cognitive Status: Within Functional Limits for tasks assessed                     General Comments       Exercises       Shoulder Instructions      Home Living Family/patient expects to be discharged to:: Private residence Living Arrangements: Spouse/significant other Available Help  at Discharge: Family Type of Home: House Home Access: Stairs to enter [employer] of Steps: 2 Entrance Stairs-Rails: None Home Layout: One level     Bathroom Shower/Tub: Occupational psychologist: Handicapped height Bathroom Accessibility: Yes   Home Equipment: Environmental consultant - 2 wheels;Cane - single point;Crutches;Shower seat - built in          Prior Functioning/Environment Level of Independence: Independent             OT Diagnosis: Generalized weakness   OT Problem List: Decreased strength;Decreased knowledge of use of DME or AE   OT Treatment/Interventions: Self-care/ADL  training;Patient/family education;Therapeutic activities;DME and/or AE instruction    OT Goals(Current goals can be found in the care plan section) Acute Rehab OT Goals Patient Stated Goal: return to independence.  OT Goal Formulation: With patient Time For Goal Achievement: 12/23/14 Potential to Achieve Goals: Good  OT Frequency: Min 2X/week   Barriers to D/C:            Co-evaluation              End of Session Equipment Utilized During Treatment: Gait belt;Left knee immobilizer CPM Left Knee Additional Comments: 4 to 6 hours per day  Activity Tolerance: Patient tolerated treatment well Patient left: in bed;with call bell/phone within reach;with family/visitor present   Time: 9373-4287 OT Time Calculation (min): 20 min Charges:  OT General Charges $OT Visit: 1 Procedure OT Evaluation $Initial OT Evaluation Tier I: 1 Procedure G-Codes:    Jules Schick  [phone] 12/16/2014, 4:16 PM

## 2014-12-16 NOTE — Discharge Instructions (Signed)

## 2014-12-17 NOTE — Progress Notes (Signed)
Utilization Review Completed.Alyscia Carmon T9/02/2015  

## 2014-12-17 NOTE — Progress Notes (Signed)
Physical Therapy Treatment Patient Details Name: Katrina [name] MRN: [id number] DOB: [birth year] Today's Date: 12/17/2014    History of Present Illness Pt admitted for L TKR with history of R TKR     PT Comments    Pt steadier with mobility and eager for dc home.  Reviewed therex and stairs. Pt declines to review don/doff KI.  Follow Up Recommendations  Home health PT     Equipment Recommendations  3in1 (PT)    Recommendations for Other Services OT consult     Precautions / Restrictions Precautions Precautions: Knee;Fall Required Braces or Orthoses: Knee Immobilizer - Left Knee Immobilizer - Left: Discontinue once straight leg raise with < 10 degree lag Restrictions Weight Bearing Restrictions: No Other Position/Activity Restrictions: WBAT    Mobility  Bed Mobility               General bed mobility comments: Pt up in chair and declines back to bed  Transfers Overall transfer level: Needs assistance Equipment used: Rolling walker (2 wheeled) Transfers: Sit to/from Stand Sit to Stand: Supervision         General transfer comment: good safety awareness. assist to lift LLE when raising foot rest due to pain.   Ambulation/Gait Ambulation/Gait assistance: Supervision Ambulation Distance (Feet): 150 Feet Assistive device: Rolling walker (2 wheeled) Gait Pattern/deviations: Step-to pattern;Step-through pattern;Decreased step length - right;Decreased step length - left;Shuffle;Trunk flexed     General Gait Details: cues for sequence, posture, position from RW, and to slow down for saftey   Stairs Stairs: Yes Stairs assistance: Min assist Stair Management: No rails;Two rails;Step to pattern;Backwards;Forwards;With walker;With crutches Number of Stairs: 4 General stair comments: 2 steps bkwd with RW, min assist and cues for sequence and foot placement.  Pt states "I dont like that way".  Attempted crutches - pt stepped up one step and "Did not like this way  either".  Completed stairs with Bil rails.  Pt states "my house is all F'd up so I [name] just do it the way I did when I went home last time"  Wheelchair Mobility    Modified Rankin (Stroke Patients Only)       Balance                                    Cognition Arousal/Alertness: Awake/alert Behavior During Therapy: WFL for tasks assessed/performed Overall Cognitive Status: Within Functional Limits for tasks assessed                      Exercises Total Joint Exercises Ankle Circles/Pumps: AROM;15 reps;Supine;Both Quad Sets: AROM;Supine;Both;15 reps Heel Slides: AAROM;Left;15 reps;Supine Straight Leg Raises: AAROM;Left;Supine;15 reps    General Comments        Pertinent Vitals/Pain Pain Assessment: 0-10 Pain Score: 4  Pain Location: L knee Pain Descriptors / Indicators: Aching;Sore Pain Intervention(s): Limited activity within patient's tolerance;Monitored during session;Premedicated before session;Ice applied    Home Living                      Prior Function            PT Goals (current goals can now be found in the care plan section) Acute Rehab PT Goals Patient Stated Goal: return to independence.  PT Goal Formulation: With patient Time For Goal Achievement: 12/19/14 Potential to Achieve Goals: Good Progress towards PT goals: Progressing toward goals    Frequency  7X/week    PT Plan Current plan remains appropriate    Co-evaluation             End of Session Equipment Utilized During Treatment: Gait belt;Left knee immobilizer Activity Tolerance: Patient tolerated treatment well Patient left: in chair;with call bell/phone within reach     Time: 0924-0955 PT Time Calculation (min) (ACUTE ONLY): 31 min  Charges:  $Gait Training: 8-22 mins $Therapeutic Exercise: 8-22 mins                    G Codes:      Sadik Piascik 2015-01-07, 12:21 PM

## 2014-12-17 NOTE — Progress Notes (Signed)
Occupational Therapy Treatment Patient Details Name: ESTERLENE ATIYEH MRN: [id number] DOB: 02/01/1956 Today's Date: 12/17/2014    History of present illness Pt admitted for L TKR with history of R TKR    OT comments  Completed all education regarding ADL and functional mobility for ADL in addition to home safety and reducing risk of falls. Goals met. Pt ready to D/C home with S when medically stable.   Follow Up Recommendations  No OT follow up;Supervision - Intermittent    Equipment Recommendations  3 in 1 bedside comode    Recommendations for Other Services      Precautions / Restrictions Precautions Precautions: Knee;Fall Required Braces or Orthoses: Knee Immobilizer - Left Knee Immobilizer - Left: Discontinue once straight leg raise with < 10 degree lag Restrictions Weight Bearing Restrictions: No       Mobility Bed Mobility               General bed mobility comments: pt up in chair  Transfers Overall transfer level: Needs assistance Equipment used: Rolling walker (2 wheeled)   Sit to Stand: Supervision         General transfer comment: good safety awareness. assist to lift LLE when raising foot rest due to pain.     Balance                                   ADL                                         General ADL Comments: Completed education with pt regarding ADL and functional mobility for ADL. Pt able to direct this therapist on how to correctly donn KI. Pt stating knee feels much better with KI on. Pt completed toilet and shower tranfers @ S level. Discussed home safety and reduceing risk of falls.       Vision                     Perception     Praxis      Cognition   Behavior During Therapy: WFL for tasks assessed/performed Overall Cognitive Status: Within Functional Limits for tasks assessed                       Extremity/Trunk Assessment               Exercises      Shoulder Instructions       General Comments      Pertinent Vitals/ Pain       Pain Assessment: 0-10 Pain Score: 2  Pain Location: L knee Pain Descriptors / Indicators: Sore Pain Intervention(s): Limited activity within patient's tolerance;Monitored during session;Repositioned;Premedicated before session  Home Living                                          Prior Functioning/Environment              Frequency       Progress Toward Goals  OT Goals(current goals can now be found in the care plan section)  Progress towards OT goals: Goals met/education completed, patient discharged from OT  Acute Rehab OT Goals Patient Stated Goal:  return to independence.  OT Goal Formulation: With patient Time For Goal Achievement: 12/23/14 Potential to Achieve Goals: Good ADL Goals Pt Will Perform Grooming: standing;with min guard assist Pt Will Transfer to Toilet: with min guard assist;ambulating;bedside commode Pt Will Perform Toileting - Clothing Manipulation and hygiene: with min guard assist;sit to/from stand Pt Will Perform Tub/Shower Transfer: Shower transfer;with min assist;3 in 1;rolling walker  Plan Discharge plan remains appropriate    Co-evaluation                 End of Session Equipment Utilized During Treatment: Gait belt;Rolling walker;Left knee immobilizer CPM Left Knee CPM Left Knee: Off   Activity Tolerance Patient tolerated treatment well   Patient Left in chair;with call bell/phone within reach   Nurse Communication Mobility status        Time: 5672-0919 OT Time Calculation (min): 17 min  Charges: OT General Charges $OT Visit: 1 Procedure OT Treatments $Self Care/Home Management : 8-22 mins  Alinna Siple,HILLARY 12/17/2014, 9:05 AM   Maurie Boettcher, OTR/L  [phone] 12/17/2014

## 2014-12-17 NOTE — Care Management Note (Addendum)
Case Management Note  Patient Details  Name: Katrina [name] MRN: [id number] Date of Birth: Aug 04, 1955  Subjective/Objective:          Left total knee arthroplasty          Action/Plan:  Home Health-offered choice for Home Health. Pt requested [facility] for [facility]. Requesting 3n1 for home. Has RW at home.  Contacted [facility] for DME for home. Contacted [facility] for [facility] for scheduled dc today.   Expected Discharge Date:  12/17/2014              Expected Discharge Plan:  [place]  In-House Referral:     Discharge planning Services  CM Consult  Post Acute Care Choice:  Home Health Choice offered to:  Patient  DME Arranged:  3-N-1 DME Agency:  [place] Arranged:  [place] [facility] Agency:  [facility]  Status of Service:  Completed, signed off  Medicare Important Message Given:    Date Medicare IM Given:    Medicare IM give by:    Date Additional Medicare IM Given:    Additional Medicare Important Message give by:     If discussed at [place] of Stay Meetings, dates discussed:    Additional Comments:  Erenest Rasher, RN 12/17/2014, 1:32 PM

## 2014-12-17 NOTE — Progress Notes (Signed)
Subjective: 2 Days Post-Op Procedure(s) (LRB): LEFT TOTAL KNEE ARTHROPLASTY (Left) Patient reports pain as mild. " I'm  Ready for discharge."   Objective: Vital signs in last 24 hours: Temp:  [97.9 F (36.6 C)-98.3 F (36.8 C)] 98.3 F (36.8 C) (09/11 0634) Pulse Rate:  [75-95] 85 (09/11 0634) Resp:  [16-17] 17 (09/11 0634) BP: (135-168)/(60-75) 140/75 mmHg (09/11 0634) SpO2:  [97 %-98 %] 98 % (09/11 0634)  Intake/Output from previous day: 09/10 0701 - 09/11 0700 In: 1308.8 [P.O.:1200; I.V.:108.8] Out: 1675 [Urine:1675] Intake/Output this shift:    No results for input(s): HGB in the last 72 hours. No results for input(s): WBC, RBC, HCT, PLT in the last 72 hours. No results for input(s): NA, K, CL, CO2, BUN, CREATININE, GLUCOSE, CALCIUM in the last 72 hours. No results for input(s): LABPT, INR in the last 72 hours.  Neurologically intact  Assessment/Plan: 2 Days Post-Op Procedure(s) (LRB): LEFT TOTAL KNEE ARTHROPLASTY (Left) Plan     D/C home with HHPT  Michelle Wnek C 12/17/2014, 12:41 PM

## 2014-12-17 NOTE — Discharge Summary (Signed)
Reviewed d/c instructions with pt and daughter including precautions, incision care, follow-up appointment, and medications.  Pt/daughter verbalized good understanding of all instructions.  Pt being d/c to home into care of daughter with [facility] follow up by [place].

## 2014-12-18 ENCOUNTER — Encounter (HOSPITAL_COMMUNITY): Payer: Self-pay | Admitting: Orthopaedic Surgery

## 2014-12-21 ENCOUNTER — Encounter (HOSPITAL_COMMUNITY): Payer: Self-pay | Admitting: Orthopaedic Surgery

## 2014-12-21 NOTE — Discharge Summary (Signed)
Patient ID: Katrina [name] MRN: [id number] DOB/AGE: 1956/03/29 59 y.o.  Admit date: 12/15/2014 Discharge date: 12/21/2014  Admission Diagnoses:  Principal Problem:   Osteoarthritis of left knee Active Problems:   Status post total left knee replacement   Discharge Diagnoses:  Same  Past Medical History  Diagnosis Date  . Asthma     hx of asthma 3/12- hospitalized   . Arthritis     knees, hips, elbows   . COPD (chronic obstructive pulmonary disease)     Surgeries: Procedure(s): LEFT TOTAL KNEE ARTHROPLASTY on 12/15/2014   Consultants:    Discharged Condition: Improved  Hospital Course: Katrina [name] is an 59 y.o. female who was admitted 12/15/2014 for operative treatment ofOsteoarthritis of left knee. Patient has severe unremitting pain that affects sleep, daily activities, and work/hobbies. After pre-op clearance the patient was taken to the operating room on 12/15/2014 and underwent  Procedure(s): LEFT TOTAL KNEE ARTHROPLASTY.    Patient was given perioperative antibiotics:  Anti-infectives    Start     Dose/Rate Route Frequency Ordered Stop   12/16/14 0600  ceFAZolin (ANCEF) IVPB 2 g/50 mL premix     2 g 100 mL/hr over 30 Minutes Intravenous On call to O.R. 12/15/14 1217 12/15/14 1458   12/15/14 2100  ceFAZolin (ANCEF) IVPB 1 g/50 mL premix     1 g 100 mL/hr over 30 Minutes Intravenous Every 6 hours 12/15/14 1802 12/16/14 0325       Patient was given sequential compression devices, early ambulation, and chemoprophylaxis to prevent DVT.  Patient benefited maximally from hospital stay and there were no complications.    Recent vital signs: No data found.    Recent laboratory studies: No results for input(s): WBC, HGB, HCT, PLT, NA, K, CL, CO2, BUN, CREATININE, GLUCOSE, INR, CALCIUM in the last 72 hours.  Invalid input(s): PT, 2   Discharge Medications:     Medication List    STOP taking these medications        acetaminophen-codeine 300-30 MG per tablet   Commonly known as:  TYLENOL #3     amoxicillin-clavulanate 875-125 MG per tablet  Commonly known as:  AUGMENTIN     aspirin 81 MG chewable tablet  Replaced by:  aspirin 325 MG EC tablet      TAKE these medications        aspirin 325 MG EC tablet  Take 1 tablet (325 mg total) by mouth 2 (two) times daily after a meal.     multivitamin with minerals Tabs tablet  Take 1 tablet by mouth daily.     oxyCODONE-acetaminophen 5-325 MG per tablet  Commonly known as:  ROXICET  Take 1-2 tablets by mouth every 4 (four) hours as needed.     promethazine 12.5 MG tablet  Commonly known as:  PHENERGAN  Take 1 tablet (12.5 mg total) by mouth every 6 (six) hours as needed for nausea or vomiting.     tiZANidine 4 MG tablet  Commonly known as:  ZANAFLEX  Take 1 tablet (4 mg total) by mouth every 6 (six) hours as needed for muscle spasms.        Diagnostic Studies: Dg Knee Left Port  12/15/2014   CLINICAL DATA:  Status post left total knee arthroplasty.  EXAM: PORTABLE LEFT KNEE - 1-2 VIEW  COMPARISON:  None.  FINDINGS: The femoral and tibial components appear to be well situated. No dislocation is noted. Expected amount of postoperative soft tissue gas is noted anteriorly.  IMPRESSION: Status  post left total knee arthroplasty.   Electronically Signed   By: Marijo Conception, M.D.   On: 12/15/2014 17:52    Disposition: 06-Home-Health Care Svc        Follow-up Information    Follow up with Mcarthur Rossetti, MD In 2 weeks.   Specialty:  Orthopedic Surgery   Contact information:   [place] [address] [phone]       Follow up with [facility].   Why:  Home Health Physical Therapy   Contact information:   [place] [address] [phone]        Signed: Mcarthur Rossetti 12/21/2014, 7:07 AM

## 2015-02-07 ENCOUNTER — Other Ambulatory Visit: Payer: Self-pay

## 2015-02-07 DIAGNOSIS — Z1231 Encounter for screening mammogram for malignant neoplasm of breast: Secondary | ICD-10-CM

## 2015-02-14 ENCOUNTER — Ambulatory Visit
Admission: RE | Admit: 2015-02-14 | Discharge: 2015-02-14 | Disposition: A | Discharge status: Home / Self Care | Payer: Medicare Other | Source: Ambulatory Visit

## 2015-02-14 DIAGNOSIS — Z1231 Encounter for screening mammogram for malignant neoplasm of breast: Secondary | ICD-10-CM

## 2015-07-27 IMAGING — CR DG CHEST 2V
2 series · 2 of 2 positions shown · non-contrast
Comparison: April 29, 2011.

CLINICAL DATA: Tobacco use, preop total knee replacement.

CHEST - 2 VIEW

[w chest pa]
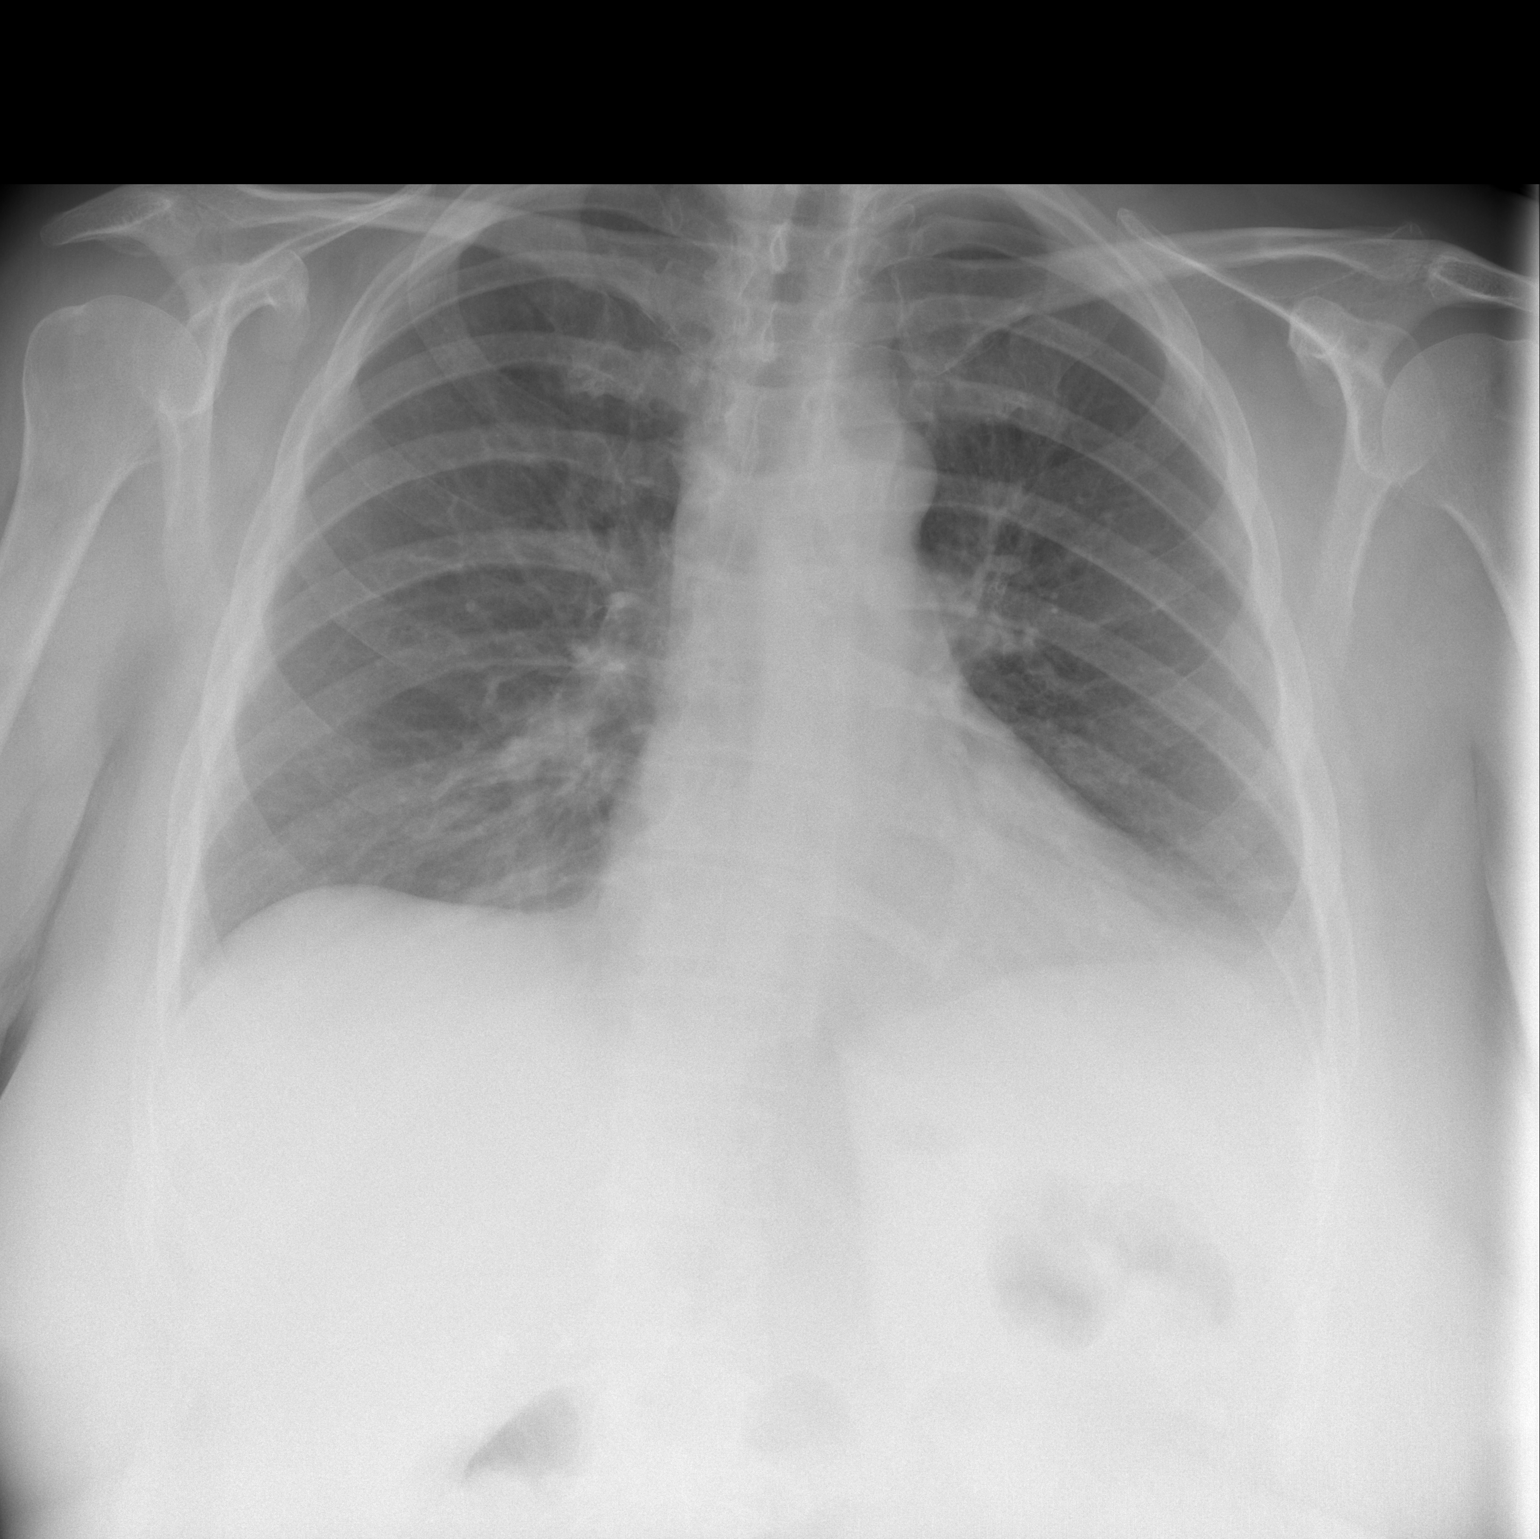

[w chest lat]
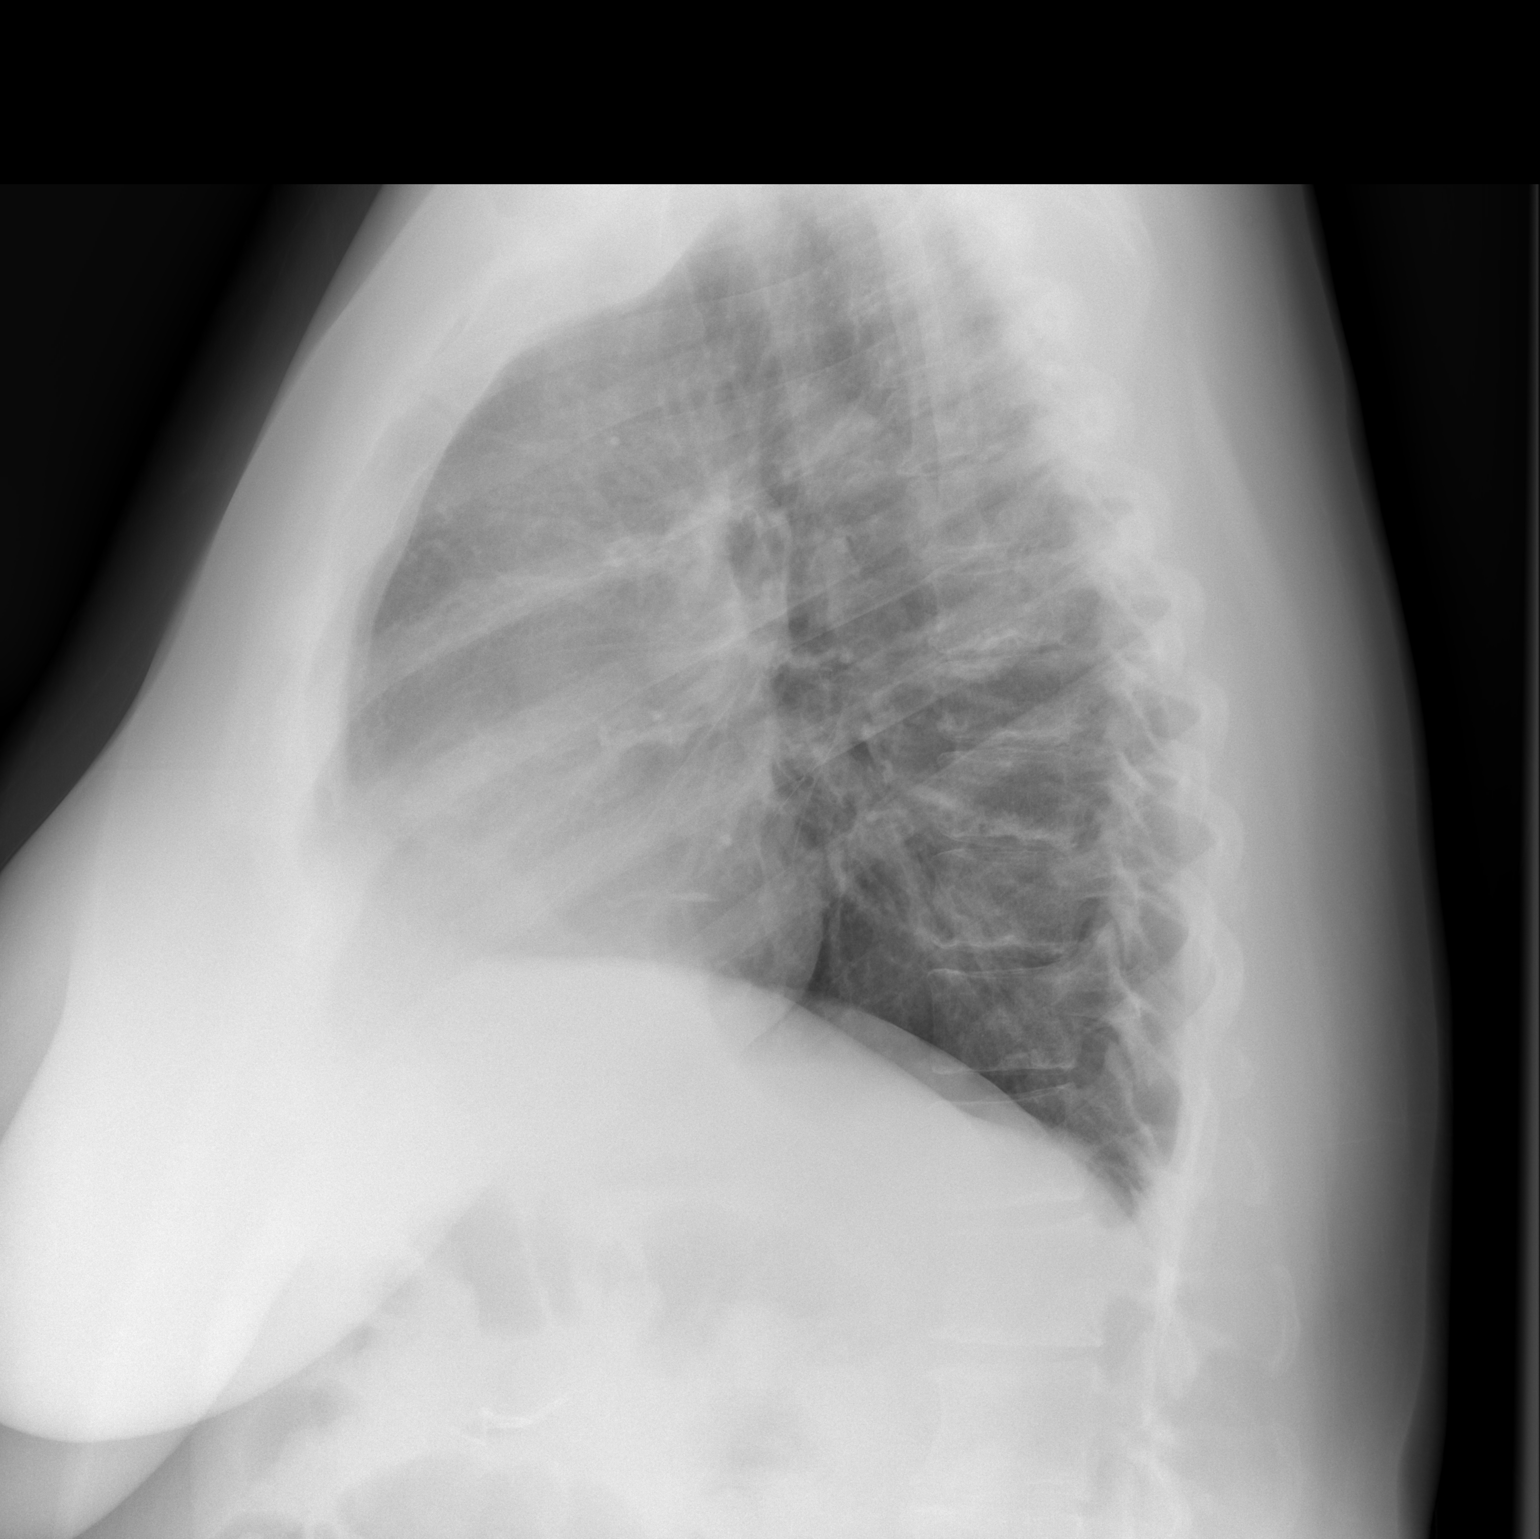

[2 of 2 positions shown; findings below may reference images not displayed]

FINDINGS: Cardiomediastinal silhouette appears normal.  No acute
pulmonary disease is noted.  Bony thorax is intact.
IMPRESSION: No acute cardiopulmonary abnormality seen.

## 2015-08-08 DIAGNOSIS — M25551 Pain in right hip: Secondary | ICD-10-CM | POA: Diagnosis not present

## 2015-08-08 DIAGNOSIS — M25562 Pain in left knee: Secondary | ICD-10-CM | POA: Diagnosis not present

## 2015-09-10 DIAGNOSIS — M25551 Pain in right hip: Secondary | ICD-10-CM | POA: Diagnosis not present

## 2015-09-10 DIAGNOSIS — M25562 Pain in left knee: Secondary | ICD-10-CM | POA: Diagnosis not present

## 2015-09-10 DIAGNOSIS — M79672 Pain in left foot: Secondary | ICD-10-CM | POA: Diagnosis not present

## 2015-11-28 DIAGNOSIS — M79672 Pain in left foot: Secondary | ICD-10-CM | POA: Diagnosis not present

## 2015-12-04 ENCOUNTER — Other Ambulatory Visit: Payer: Self-pay | Admitting: Physician Assistant

## 2015-12-04 DIAGNOSIS — M25572 Pain in left ankle and joints of left foot: Secondary | ICD-10-CM

## 2015-12-15 ENCOUNTER — Ambulatory Visit
Admission: RE | Admit: 2015-12-15 | Discharge: 2015-12-15 | Disposition: A | Discharge status: Home / Self Care | Payer: Medicare Other | Source: Ambulatory Visit | Attending: Physician Assistant | Admitting: Physician Assistant

## 2015-12-15 DIAGNOSIS — M25572 Pain in left ankle and joints of left foot: Secondary | ICD-10-CM | POA: Diagnosis not present

## 2015-12-19 DIAGNOSIS — M79672 Pain in left foot: Secondary | ICD-10-CM | POA: Diagnosis not present

## 2016-04-11 ENCOUNTER — Telehealth (INDEPENDENT_AMBULATORY_CARE_PROVIDER_SITE_OTHER): Payer: Self-pay | Admitting: Orthopaedic Surgery

## 2016-04-11 NOTE — Telephone Encounter (Signed)
Holding- Patient is aware that Dr. Ninfa Linden is not in the office today.

## 2016-04-11 NOTE — Telephone Encounter (Signed)
Noted. Talked with patient and she is aware that Dr. Ninfa Linden is not here today.

## 2016-04-11 NOTE — Telephone Encounter (Signed)
Patient no longer uses [facility] in [place].  She now uses [facility]  She is completely out of the Tylenol #3.  Left a message for that and the Rx for nausea.

## 2016-04-11 NOTE — Telephone Encounter (Signed)
Pt requesting Tylenol #3 and she said there's a nausea med rx that goes with this.  Pt number is [phone]

## 2016-04-14 NOTE — Telephone Encounter (Signed)
See below

## 2016-04-14 NOTE — Telephone Encounter (Signed)
Called into patients [facility]

## 2016-04-14 NOTE — Telephone Encounter (Signed)
Ok to call in Tylenol #3, 1-2 po twice daily as needed for pain, #60 and phenergan 12.5 mg, 1 po twice daily as needed for nausea, #60.  Thanks.

## 2016-09-24 ENCOUNTER — Ambulatory Visit (INDEPENDENT_AMBULATORY_CARE_PROVIDER_SITE_OTHER): Payer: Medicare Other

## 2016-09-24 ENCOUNTER — Encounter (INDEPENDENT_AMBULATORY_CARE_PROVIDER_SITE_OTHER): Payer: Self-pay | Admitting: Orthopaedic Surgery

## 2016-09-24 ENCOUNTER — Ambulatory Visit (INDEPENDENT_AMBULATORY_CARE_PROVIDER_SITE_OTHER): Payer: Medicare Other | Admitting: Orthopaedic Surgery

## 2016-09-24 DIAGNOSIS — M542 Cervicalgia: Secondary | ICD-10-CM | POA: Diagnosis not present

## 2016-09-24 DIAGNOSIS — G8929 Other chronic pain: Secondary | ICD-10-CM | POA: Diagnosis not present

## 2016-09-24 DIAGNOSIS — M25511 Pain in right shoulder: Secondary | ICD-10-CM

## 2016-09-24 MED ORDER — METHYLPREDNISOLONE 4 MG PO TABS
ORAL_TABLET | ORAL | 0 refills | Status: DC
Start: 2016-09-24 — End: 2017-12-01

## 2016-09-24 MED ORDER — ETODOLAC 500 MG PO TABS: 500.0000 mg | ORAL_TABLET | Freq: Two times a day (BID) | ORAL | 3 refills | Status: DC
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
## 2016-10-09 ENCOUNTER — Ambulatory Visit (INDEPENDENT_AMBULATORY_CARE_PROVIDER_SITE_OTHER): Payer: Medicare Other | Admitting: Orthopaedic Surgery

## 2016-10-23 ENCOUNTER — Ambulatory Visit
Admission: RE | Admit: 2016-10-23 | Discharge: 2016-10-23 | Disposition: A | Discharge status: Home / Self Care | Payer: Medicare Other | Source: Ambulatory Visit | Attending: Orthopaedic Surgery | Admitting: Orthopaedic Surgery

## 2016-10-23 DIAGNOSIS — M25511 Pain in right shoulder: Principal | ICD-10-CM

## 2016-10-23 DIAGNOSIS — G8929 Other chronic pain: Secondary | ICD-10-CM

## 2016-10-28 ENCOUNTER — Ambulatory Visit (INDEPENDENT_AMBULATORY_CARE_PROVIDER_SITE_OTHER): Payer: Medicare Other | Admitting: Orthopaedic Surgery

## 2016-10-28 DIAGNOSIS — M25511 Pain in right shoulder: Secondary | ICD-10-CM | POA: Diagnosis not present

## 2016-10-28 DIAGNOSIS — M7541 Impingement syndrome of right shoulder: Secondary | ICD-10-CM

## 2016-10-28 DIAGNOSIS — G8929 Other chronic pain: Secondary | ICD-10-CM

## 2016-10-28 NOTE — Progress Notes (Signed)
The patient is here for follow-up after an MRI of her right shoulder due to worsening pain and weakness in the shoulder. She still having troubles sleeping at night. Reaching overhead and behind her is very painful. It is now detrimentally affects her activity is daily living, her quality of life, her mobility.  On examination the right shoulder she has profound pain and profound signs of impingement syndrome. Her rotator cuff itself does not feel severely weak. Her range of motion is limited with internal rotation and adduction being harness to get. She has negative liftoff. She has a lot of pain at the acromioclavicular joint as well.  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 There is also moderate acromioclavicular joint arthropathy. The shoulders well located. There is interstitial tearing of the rotator cuff as well.  At this point she does wish proceed with arthroscopic intervention and I agree with this. She is tried and failed all forms conservative treatment and this is worsening for her and is detrimentally affecting her on a daily basis. I showed her shoulder model we spent in detail with the surgery involves including a thorough discussion of risk and benefits of right shoulder arthroscopic surgery and what her intraoperative and postoperative course would be. She does wish to have this set up in the near future. We would then see her back in one week postoperative for suture removal. All questions were encouraged and answered.

## 2016-11-06 ENCOUNTER — Encounter: Payer: Self-pay | Admitting: Orthopaedic Surgery

## 2016-11-06 DIAGNOSIS — M19011 Primary osteoarthritis, right shoulder: Secondary | ICD-10-CM | POA: Diagnosis not present

## 2016-11-06 DIAGNOSIS — G8918 Other acute postprocedural pain: Secondary | ICD-10-CM | POA: Diagnosis not present

## 2016-11-06 DIAGNOSIS — M7541 Impingement syndrome of right shoulder: Secondary | ICD-10-CM | POA: Diagnosis not present

## 2016-11-06 DIAGNOSIS — M65811 Other synovitis and tenosynovitis, right shoulder: Secondary | ICD-10-CM | POA: Diagnosis not present

## 2016-11-06 DIAGNOSIS — M7581 Other shoulder lesions, right shoulder: Secondary | ICD-10-CM | POA: Diagnosis not present

## 2016-11-13 ENCOUNTER — Ambulatory Visit (INDEPENDENT_AMBULATORY_CARE_PROVIDER_SITE_OTHER): Payer: Medicare Other | Admitting: Physician Assistant

## 2016-11-13 DIAGNOSIS — Z9889 Other specified postprocedural states: Secondary | ICD-10-CM

## 2016-11-13 NOTE — Progress Notes (Signed)
Katrina [name] is today 1 week status post right shoulder arthroscopy with arthroscopic debridement and  acromioplasty without coracoacromial release. Overall doing well. No complaints no fevers chills shortness of breath.   Right shoulder she has limited forward flexion and abduction. She's able to bring the forearm to full extension full flexion. Radial pulses intact. Port sites of the shoulder well approximated with nylon sutures no signs of infection.   Impressions: 1 week status post right shoulder arthroscopic debridement with subacromial decompression and partial acromioplasty.   Plan: She'll work on [employer], pendulum, wall crawls and poor flexion exercises shown today. Sutures were removed Steri-Strips applied. Scar tissue mobilization encouraged. See her back in 1 month check progress lack of.

## 2017-06-04 DIAGNOSIS — H5213 Myopia, bilateral: Secondary | ICD-10-CM | POA: Diagnosis not present

## 2017-09-25 ENCOUNTER — Other Ambulatory Visit: Payer: Self-pay | Admitting: Physician Assistant

## 2017-09-25 ENCOUNTER — Other Ambulatory Visit (HOSPITAL_COMMUNITY)
Admission: RE | Admit: 2017-09-25 | Discharge: 2017-09-25 | Disposition: A | Discharge status: Home / Self Care | Payer: Medicare Other | Source: Ambulatory Visit | Attending: Family Medicine | Admitting: Family Medicine

## 2017-09-25 DIAGNOSIS — Z124 Encounter for screening for malignant neoplasm of cervix: Secondary | ICD-10-CM | POA: Diagnosis present

## 2017-09-25 DIAGNOSIS — R102 Pelvic and perineal pain: Secondary | ICD-10-CM | POA: Diagnosis not present

## 2017-09-25 DIAGNOSIS — R630 Anorexia: Secondary | ICD-10-CM | POA: Diagnosis not present

## 2017-09-25 DIAGNOSIS — N950 Postmenopausal bleeding: Secondary | ICD-10-CM | POA: Diagnosis not present

## 2017-09-26 ENCOUNTER — Other Ambulatory Visit: Payer: Self-pay | Admitting: Physician Assistant

## 2017-09-26 DIAGNOSIS — R102 Pelvic and perineal pain: Secondary | ICD-10-CM

## 2017-09-26 DIAGNOSIS — N950 Postmenopausal bleeding: Secondary | ICD-10-CM

## 2017-09-29 LAB — CYTOLOGY - PAP: DIAGNOSIS: NEGATIVE

## 2017-10-02 ENCOUNTER — Telehealth: Payer: Self-pay

## 2017-10-02 NOTE — Telephone Encounter (Signed)
Faxed request for GI records to [facility] 10/02/17 LM

## 2017-10-05 HISTORY — PX: COLONOSCOPY: SHX174

## 2017-10-06 ENCOUNTER — Ambulatory Visit
Admission: RE | Admit: 2017-10-06 | Discharge: 2017-10-06 | Disposition: A | Discharge status: Home / Self Care | Payer: Medicare Other | Source: Ambulatory Visit | Attending: Physician Assistant | Admitting: Physician Assistant

## 2017-10-06 DIAGNOSIS — R102 Pelvic and perineal pain: Secondary | ICD-10-CM

## 2017-10-06 DIAGNOSIS — N950 Postmenopausal bleeding: Secondary | ICD-10-CM

## 2017-10-06 DIAGNOSIS — N939 Abnormal uterine and vaginal bleeding, unspecified: Secondary | ICD-10-CM | POA: Diagnosis not present

## 2017-10-20 DIAGNOSIS — Z0000 Encounter for general adult medical examination without abnormal findings: Secondary | ICD-10-CM | POA: Diagnosis not present

## 2017-10-23 DIAGNOSIS — N898 Other specified noninflammatory disorders of vagina: Secondary | ICD-10-CM | POA: Diagnosis not present

## 2017-10-23 DIAGNOSIS — N950 Postmenopausal bleeding: Secondary | ICD-10-CM | POA: Diagnosis not present

## 2017-10-26 DIAGNOSIS — R194 Change in bowel habit: Secondary | ICD-10-CM | POA: Diagnosis not present

## 2017-11-03 DIAGNOSIS — R194 Change in bowel habit: Secondary | ICD-10-CM | POA: Diagnosis not present

## 2017-11-11 DIAGNOSIS — N950 Postmenopausal bleeding: Secondary | ICD-10-CM | POA: Diagnosis not present

## 2017-11-11 DIAGNOSIS — D250 Submucous leiomyoma of uterus: Secondary | ICD-10-CM | POA: Diagnosis not present

## 2017-12-01 NOTE — Patient Instructions (Addendum)
Your procedure is scheduled on: Thursday December 10, 2017 at 12:30  Enter through the Main Entrance of [facility] at: 11:00 am Pick up the phone at the desk and dial [phone].  Call this number if you have problems the morning of surgery: [phone].  Remember: Do NOT eat food or drink any liquids: after Midnight on Wednesday September 4  Take these medicines the morning of surgery with a SIP OF WATER: NONE  STOP ALL VITAMINS, SUPPLEMENTS, HERBAL MEDICATIONS, NSAIDS NOW  Do NOT wear jewelry (body piercing), metal hair clips/bobby pins, make-up, or nail polish. Do NOT wear lotions, powders, or perfumes.  You may wear deoderant. Do NOT shave for 48 hours prior to surgery. Do NOT bring valuables to the hospital. Contacts, dentures, or bridgework may not be worn into surgery.  Have a responsible adult drive you home and stay with you for 24 hours after your procedure

## 2017-12-02 ENCOUNTER — Encounter (HOSPITAL_COMMUNITY)
Admission: RE | Admit: 2017-12-02 | Discharge: 2017-12-02 | Disposition: A | Discharge status: Home / Self Care | Payer: Medicare Other | Source: Ambulatory Visit | Attending: Obstetrics and Gynecology | Admitting: Obstetrics and Gynecology

## 2017-12-02 ENCOUNTER — Encounter (HOSPITAL_COMMUNITY): Payer: Self-pay

## 2017-12-02 ENCOUNTER — Other Ambulatory Visit: Payer: Self-pay

## 2017-12-02 DIAGNOSIS — R102 Pelvic and perineal pain: Secondary | ICD-10-CM | POA: Insufficient documentation

## 2017-12-02 DIAGNOSIS — Z01818 Encounter for other preprocedural examination: Secondary | ICD-10-CM | POA: Insufficient documentation

## 2017-12-02 HISTORY — DX: Pneumonia, unspecified organism: J18.9

## 2017-12-02 HISTORY — DX: Headache: R51

## 2017-12-02 HISTORY — DX: Malignant neoplasm of intestinal tract, part unspecified: C26.0

## 2017-12-02 HISTORY — DX: Anxiety disorder, unspecified: F41.9

## 2017-12-02 HISTORY — DX: Headache, unspecified: R51.9

## 2017-12-02 HISTORY — DX: Gastro-esophageal reflux disease without esophagitis: K21.9

## 2017-12-02 LAB — CBC
HEMATOCRIT: 43.3 % (ref 36.0–46.0)
HEMOGLOBIN: 14.7 g/dL (ref 12.0–15.0)
MCH: 33.2 pg (ref 26.0–34.0)
MCHC: 33.9 g/dL (ref 30.0–36.0)
MCV: 97.7 fL (ref 78.0–100.0)
Platelets: 339 10*3/uL (ref 150–400)
RBC: 4.43 MIL/uL (ref 3.87–5.11)
RDW: 14.3 % (ref 11.5–15.5)
WBC: 13 10*3/uL — ABNORMAL HIGH (ref 4.0–10.5)

## 2017-12-02 NOTE — Pre-Procedure Instructions (Signed)
DR Lanetta Inch VIEWED EKG, AWARE OF HISTORY. NO CLEARANCE NEEDED

## 2017-12-04 DIAGNOSIS — N950 Postmenopausal bleeding: Secondary | ICD-10-CM | POA: Diagnosis not present

## 2017-12-10 ENCOUNTER — Encounter (HOSPITAL_COMMUNITY)
Admission: RE | Disposition: A | Discharge status: Home / Self Care | Payer: Self-pay | Source: Ambulatory Visit | Attending: Obstetrics and Gynecology

## 2017-12-10 ENCOUNTER — Other Ambulatory Visit: Payer: Self-pay

## 2017-12-10 ENCOUNTER — Ambulatory Visit (HOSPITAL_COMMUNITY): Payer: Medicare Other | Admitting: Anesthesiology

## 2017-12-10 ENCOUNTER — Other Ambulatory Visit: Payer: Self-pay | Admitting: Obstetrics and Gynecology

## 2017-12-10 ENCOUNTER — Ambulatory Visit (HOSPITAL_COMMUNITY)
Admission: RE | Admit: 2017-12-10 | Discharge: 2017-12-10 | Disposition: A | Discharge status: Home / Self Care | Payer: Medicare Other | Source: Ambulatory Visit | Attending: Obstetrics and Gynecology | Admitting: Obstetrics and Gynecology

## 2017-12-10 ENCOUNTER — Encounter (HOSPITAL_COMMUNITY): Payer: Self-pay

## 2017-12-10 DIAGNOSIS — N950 Postmenopausal bleeding: Secondary | ICD-10-CM | POA: Insufficient documentation

## 2017-12-10 DIAGNOSIS — Z8049 Family history of malignant neoplasm of other genital organs: Secondary | ICD-10-CM | POA: Insufficient documentation

## 2017-12-10 DIAGNOSIS — Z96653 Presence of artificial knee joint, bilateral: Secondary | ICD-10-CM | POA: Diagnosis not present

## 2017-12-10 DIAGNOSIS — I10 Essential (primary) hypertension: Secondary | ICD-10-CM | POA: Diagnosis not present

## 2017-12-10 DIAGNOSIS — F17210 Nicotine dependence, cigarettes, uncomplicated: Secondary | ICD-10-CM | POA: Diagnosis not present

## 2017-12-10 DIAGNOSIS — D259 Leiomyoma of uterus, unspecified: Secondary | ICD-10-CM | POA: Diagnosis not present

## 2017-12-10 DIAGNOSIS — J449 Chronic obstructive pulmonary disease, unspecified: Secondary | ICD-10-CM | POA: Diagnosis not present

## 2017-12-10 DIAGNOSIS — Z96642 Presence of left artificial hip joint: Secondary | ICD-10-CM | POA: Diagnosis not present

## 2017-12-10 DIAGNOSIS — D250 Submucous leiomyoma of uterus: Secondary | ICD-10-CM | POA: Diagnosis not present

## 2017-12-10 DIAGNOSIS — N856 Intrauterine synechiae: Secondary | ICD-10-CM | POA: Diagnosis not present

## 2017-12-10 HISTORY — PX: DILATATION & CURETTAGE/HYSTEROSCOPY WITH MYOSURE: SHX6511

## 2017-12-10 SURGERY — DILATATION & CURETTAGE/HYSTEROSCOPY WITH MYOSURE
Anesthesia: General | Site: Vagina

## 2017-12-10 MED ORDER — ONDANSETRON HCL 4 MG/2ML IJ SOLN
INTRAMUSCULAR | Status: DC | PRN
  Administered 2017-12-10: 4 mg via INTRAVENOUS

## 2017-12-10 MED ORDER — PROPOFOL 10 MG/ML IV BOLUS
INTRAVENOUS | Status: AC
  Filled 2017-12-10: qty 20

## 2017-12-10 MED ORDER — FENTANYL CITRATE (PF) 100 MCG/2ML IJ SOLN
25.0000 ug | INTRAMUSCULAR | Status: DC | PRN
  Administered 2017-12-10 (×2): 50 ug via INTRAVENOUS

## 2017-12-10 MED ORDER — DEXAMETHASONE SODIUM PHOSPHATE 10 MG/ML IJ SOLN
INTRAMUSCULAR | Status: DC | PRN
  Administered 2017-12-10: 10 mg via INTRAVENOUS

## 2017-12-10 MED ORDER — IBUPROFEN 800 MG PO TABS: 800.0000 mg | ORAL_TABLET | Freq: Three times a day (TID) | ORAL | 0 refills | Status: DC | PRN

## 2017-12-10 MED ORDER — KETOROLAC TROMETHAMINE 30 MG/ML IJ SOLN
INTRAMUSCULAR | Status: DC | PRN
  Administered 2017-12-10: 30 mg via INTRAVENOUS

## 2017-12-10 MED ORDER — SODIUM CHLORIDE 0.9 % IR SOLN
Status: DC | PRN
  Administered 2017-12-10: 3000 mL

## 2017-12-10 MED ORDER — FENTANYL CITRATE (PF) 100 MCG/2ML IJ SOLN
INTRAMUSCULAR | Status: DC | PRN
  Administered 2017-12-10: 100 ug via INTRAVENOUS

## 2017-12-10 MED ORDER — ACETAMINOPHEN 325 MG PO TABS: 325.0000 mg | ORAL_TABLET | ORAL | Status: DC | PRN

## 2017-12-10 MED ORDER — LACTATED RINGERS IV SOLN
INTRAVENOUS | Status: DC
  Administered 2017-12-10: 1000 mL via INTRAVENOUS
  Administered 2017-12-10: 13:00:00 via INTRAVENOUS

## 2017-12-10 MED ORDER — DEXAMETHASONE SODIUM PHOSPHATE 10 MG/ML IJ SOLN
INTRAMUSCULAR | Status: AC
  Filled 2017-12-10: qty 1

## 2017-12-10 MED ORDER — LIDOCAINE HCL (CARDIAC) PF 100 MG/5ML IV SOSY
PREFILLED_SYRINGE | INTRAVENOUS | Status: AC
  Filled 2017-12-10: qty 5

## 2017-12-10 MED ORDER — KETOROLAC TROMETHAMINE 30 MG/ML IJ SOLN
INTRAMUSCULAR | Status: AC
  Filled 2017-12-10: qty 1

## 2017-12-10 MED ORDER — MIDAZOLAM HCL 2 MG/2ML IJ SOLN
INTRAMUSCULAR | Status: DC | PRN
  Administered 2017-12-10: 2 mg via INTRAVENOUS

## 2017-12-10 MED ORDER — ONDANSETRON HCL 4 MG/2ML IJ SOLN
INTRAMUSCULAR | Status: AC
  Filled 2017-12-10: qty 2

## 2017-12-10 MED ORDER — OXYCODONE HCL 5 MG PO TABS: 5.0000 mg | ORAL_TABLET | Freq: Once | ORAL | Status: DC | PRN

## 2017-12-10 MED ORDER — BUPIVACAINE HCL (PF) 0.25 % IJ SOLN
INTRAMUSCULAR | Status: AC
  Filled 2017-12-10: qty 30

## 2017-12-10 MED ORDER — MEPERIDINE HCL 25 MG/ML IJ SOLN: 6.2500 mg | INTRAMUSCULAR | Status: DC | PRN

## 2017-12-10 MED ORDER — MIDAZOLAM HCL 2 MG/2ML IJ SOLN
INTRAMUSCULAR | Status: AC
  Filled 2017-12-10: qty 2

## 2017-12-10 MED ORDER — ONDANSETRON HCL 4 MG/2ML IJ SOLN: 4.0000 mg | Freq: Once | INTRAMUSCULAR | Status: DC | PRN

## 2017-12-10 MED ORDER — OXYCODONE HCL 5 MG/5ML PO SOLN: 5.0000 mg | Freq: Once | ORAL | Status: DC | PRN

## 2017-12-10 MED ORDER — ACETAMINOPHEN 160 MG/5ML PO SOLN: 325.0000 mg | ORAL | Status: DC | PRN

## 2017-12-10 MED ORDER — SUFENTANIL CITRATE 50 MCG/ML IV SOLN
INTRAVENOUS | Status: DC | PRN
  Administered 2017-12-10: 160 ug via INTRAVENOUS

## 2017-12-10 MED ORDER — FENTANYL CITRATE (PF) 100 MCG/2ML IJ SOLN
INTRAMUSCULAR | Status: AC
  Filled 2017-12-10: qty 2

## 2017-12-10 MED ORDER — KETOROLAC TROMETHAMINE 30 MG/ML IJ SOLN: 30.0000 mg | Freq: Once | INTRAMUSCULAR | Status: DC | PRN

## 2017-12-10 MED ORDER — LACTATED RINGERS IV SOLN: INTRAVENOUS | Status: DC

## 2017-12-10 MED ORDER — FENTANYL CITRATE (PF) 100 MCG/2ML IJ SOLN
INTRAMUSCULAR | Status: AC
  Administered 2017-12-10: 50 ug via INTRAVENOUS
  Filled 2017-12-10: qty 2

## 2017-12-10 MED ORDER — BUPIVACAINE HCL (PF) 0.25 % IJ SOLN
INTRAMUSCULAR | Status: DC | PRN
  Administered 2017-12-10: 20 mL

## 2017-12-10 SURGICAL SUPPLY — 17 items
CANISTER SUCT 3000ML PPV (MISCELLANEOUS) ×3 IMPLANT
CATH ROBINSON RED A/P 16FR (CATHETERS) ×3 IMPLANT
DEVICE MYOSURE LITE (MISCELLANEOUS) ×1 IMPLANT
DEVICE MYOSURE REACH (MISCELLANEOUS) IMPLANT
FILTER ARTHROSCOPY CONVERTOR (FILTER) ×3 IMPLANT
GLOVE BIOGEL M 6.5 STRL (GLOVE) ×6 IMPLANT
GLOVE BIOGEL PI IND STRL 6.5 (GLOVE) ×2 IMPLANT
GLOVE BIOGEL PI IND STRL 7.0 (GLOVE) ×2 IMPLANT
GLOVE BIOGEL PI INDICATOR 6.5 (GLOVE) ×1
GLOVE BIOGEL PI INDICATOR 7.0 (GLOVE) ×1
GOWN STRL REUS W/TWL LRG LVL3 (GOWN DISPOSABLE) ×6 IMPLANT
PACK VAGINAL MINOR WOMEN LF (CUSTOM PROCEDURE TRAY) ×3 IMPLANT
PAD OB MATERNITY 4.3X12.25 (PERSONAL CARE ITEMS) ×3 IMPLANT
SEAL ROD LENS SCOPE MYOSURE (ABLATOR) ×3 IMPLANT
TOWEL OR 17X24 6PK STRL BLUE (TOWEL DISPOSABLE) ×6 IMPLANT
TUBING AQUILEX INFLOW (TUBING) ×3 IMPLANT
TUBING AQUILEX OUTFLOW (TUBING) ×3 IMPLANT

## 2017-12-10 NOTE — Anesthesia Procedure Notes (Signed)
Procedure Name: LMA Insertion Date/Time: 12/10/2017 1:18 PM Performed by: Genevie Ann, CRNA Pre-anesthesia Checklist: Patient identified, Emergency Drugs available, Suction available, Patient being monitored and Timeout performed Patient Re-evaluated:Patient Re-evaluated prior to induction Oxygen Delivery Method: Circle system utilized Preoxygenation: Pre-oxygenation with 100% oxygen Induction Type: IV induction LMA: LMA inserted LMA Size: 4.0 Number of attempts: 1 ETT to lip (cm): at lips. Dental Injury: Teeth and Oropharynx as per pre-operative assessment

## 2017-12-10 NOTE — Anesthesia Preprocedure Evaluation (Signed)
Anesthesia Evaluation  Patient identified by MRN, date of birth, ID band Patient awake    Reviewed: Allergy & Precautions, NPO status , Patient's Chart, lab work & pertinent test results  Airway Mallampati: I       Dental no notable dental hx. (+) Teeth Intact   Pulmonary Current Smoker,    Pulmonary exam normal breath sounds clear to auscultation       Cardiovascular Normal cardiovascular exam Rhythm:Regular Rate:Normal     Neuro/Psych    GI/Hepatic Neg liver ROS,   Endo/Other  negative endocrine ROS  Renal/GU negative Renal ROS  negative genitourinary   Musculoskeletal   Abdominal (+) + obese,   Peds  Hematology negative hematology ROS (+)   Anesthesia Other Findings   Reproductive/Obstetrics                             Anesthesia Physical Anesthesia Plan  ASA: II  Anesthesia Plan: General   Post-op Pain Management:    Induction: Intravenous  PONV Risk Score and Plan: 4 or greater and Ondansetron and Dexamethasone  Airway Management Planned: LMA  Additional Equipment:   Intra-op Plan:   Post-operative Plan: Extubation in OR  Informed Consent: I have reviewed the patients History and Physical, chart, labs and discussed the procedure including the risks, benefits and alternatives for the proposed anesthesia with the patient or authorized representative who has indicated his/her understanding and acceptance.   Dental advisory given  Plan Discussed with: CRNA and Surgeon  Anesthesia Plan Comments:         Anesthesia Quick Evaluation

## 2017-12-10 NOTE — Discharge Instructions (Signed)
DISCHARGE INSTRUCTIONS: D&C The following instructions have been prepared to help you care for yourself upon your return home.   Personal hygiene:  Use sanitary pads for vaginal drainage, not tampons.  Shower the day after your procedure.  NO tub baths, pools or Jacuzzis for 2-3 weeks.  Wipe front to back after using the bathroom.  Activity and limitations:  Do NOT drive or operate any equipment for 24 hours. The effects of anesthesia are still present and drowsiness may result.  Do NOT rest in bed all day.  Walking is encouraged.  Walk up and down stairs slowly.  You may resume your normal activity in one to two days or as indicated by your physician.  Sexual activity: NO intercourse for at least 2 weeks after the procedure, or as indicated by your physician.  Diet: Eat a light meal as desired this evening. You may resume your usual diet tomorrow.  Return to work: You may resume your work activities in one to two days or as indicated by your doctor.  What to expect after your surgery: Expect to have vaginal bleeding/discharge for 2-3 days and spotting for up to 10 days. It is not unusual to have soreness for up to 1-2 weeks. You may have a slight burning sensation when you urinate for the first day. Mild cramps may continue for a couple of days. You may have a regular period in 2-6 weeks.  Call your doctor for any of the following:  Excessive vaginal bleeding, saturating and changing one pad every hour.  Inability to urinate 6 hours after discharge from hospital.  Pain not relieved by pain medication.  Fever of 100.4 F or greater.  Unusual vaginal discharge or odor.   Post Anesthesia Home Care Instructions  Activity: Get plenty of rest for the remainder of the day. A responsible individual must stay with you for 24 hours following the procedure.  For the next 24 hours, DO NOT: -Drive a car -Operate machinery -Drink alcoholic beverages -Take any medication unless instructed  by your physician -Make any legal decisions or sign important papers.  Meals: Start with liquid foods such as gelatin or soup. Progress to regular foods as tolerated. Avoid greasy, spicy, heavy foods. If nausea and/or vomiting occur, drink only clear liquids until the nausea and/or vomiting subsides. Call your physician if vomiting continues.  Special Instructions/Symptoms: Your throat may feel dry or sore from the anesthesia or the breathing tube placed in your throat during surgery. If this causes discomfort, gargle with warm salt water. The discomfort should disappear within 24 hours.         

## 2017-12-10 NOTE — H&P (Addendum)
Reason for Appointment  1. Postmenopausal bleeding.    History of Present Illness  General:  62 y/o presents for Sonohystogram. she was referred by Earlie Server scifres due to abnormal ultrasound. she had an ultrasound due to vaginal discharge with blood and cramping. She had an ultrasound 10/06/2017 . her uterus measures 7.7cm x 3.0 cm x 4.0 cm .Marland Kitchen Her endometrium was 4.6 mm. she still has spotting. she had a pap smear and pelvic exam with Dr. Perlie Mayo. ..she is not sexually active. ..     Current Medications  Taking   Multivitamins Tablet 1 tablet Orally once a day   tylenol 1 tab Oral , Notes: prn    Past Medical History  COPD.   Anxiety/depression.   Gastritis.   Hyperlipidemia.   History of alcohol abuse.   congenital hip dysplasia, OA.   OA - knees - Dr. Ninfa Linden.   HTN.    Surgical History  C section x2  back surgery, ruptured disc x2  knee surgery x2-left  carpal tunnel release left  foot surgery right  cholecystectomy   knee replacement, right 11/2012  hip replacement, left 04/2011  left elbow ulnar nerve surgery   left TKA 2018  right shoulder   exploratory laporoscopy, endometriosis   endoscopy x3   tubal ligation   colonoscopy 2019   Family History  Father: deceased 10 yrs, cancer, unknown  Mother: alive, cervical cancer  Paternal [place] Father: deceased  Paternal [place] Mother: deceased  Maternal [place] Father: deceased, heart attack  Maternal Grand Mother: deceased  Brother 1: deceased, diabetes, PE  Brother2: deceased  Daughter(s): alive  2 brother(s) . 2daughter(s) - healthy.   Non-Contributory  neg fam hx for colon cancer or polyps, liver or kidney disease.   Social History  General:  Alcohol: yes, beer 3-4 times a week. Children: 2, daughter (s). Seat belt use: yes. Tobacco use cigarettes: Current smoker, Frequency: 1 PPD, Tobacco history last updated 10/26/2017. Marital Status: married. no Recreational drug use. OCCUPATION: unemployed. Exercise:  NONE.    Allergies  Codeine Sulfate: itch, vomitting   Hospitalization/Major Diagnostic Procedure  see surgery   cat bite 2018  not in the past yr 10/2017   Review of Systems  Negative except as stated in HPI.      Examination  General Examination: CONSTITUTIONAL: alert, oriented, NAD . SKIN: moist, warm. EYES: Conjunctiva clear. LUNGS: good I:E efffort noted. Clear to auscultation bilaterally . Heart regular rate and rhythm ABDOMEN: soft, non-tender/non-distended, bowel sounds present . FEMALE GENITOURINARY: normal external genitalia, labia - unremarkable, vagina - pink moist mucosa, no lesions or abnormal discharge, cervix - no discharge or lesions or CMT, adnexa - no masses or tenderness, uterus - nontender and normal size on palpation . PSYCH: affect normal, good eye contact.     Assessments   1. Postmenopausal bleeding - N95.0 (Primary)   2. 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 Postmenopausal bleeding  IMAGING: [place] SONOHYSTEROGRAM Notes: given ongoing bleeding and spotting disucssed with patien hyserosocpy D&C with possibl plypectomy for further evaluation.  R/B/A of surgery were discussed including but not limited to infection, bleeding, uterine perforation with the need for further surgery. Pt voiced understanding and desires to proceed.   Referral To: Reason:please check insurance coverage of hysteroscopy D&C possible hysteroscopic myomectomy vis polypectomy with myosure    2. Fibroids, submucosal  Referral To: Reason:please check insurance coverage of hysteroscopy D&C possible hysteroscopic myomectomy vis polypectomy with myosure    Procedures  SHG:  Indication postmenopausal bleeding . Consent risks and benefits reviewed, verbal informed consent obtained. Procedure Cervical Prep w betadine, catheter passed without problem. Tenaculum no. dilatation no. Post procedure patient tolerated well. Findings endometium was difficult to evaluate .Marland Kitchen no masses were seen.  Marland Kitchen

## 2017-12-10 NOTE — Op Note (Signed)
12/10/2017  1:43 PM  PATIENT:  Katrina [name]  62 y.o. female  PRE-OPERATIVE DIAGNOSIS:  D25.0 Fibroids, submucosal N95.0 postmenopausal bleeding  POST-OPERATIVE DIAGNOSIS:  fibroids, submucosal post menopausal bleeding  PROCEDURE:  Procedure(s) with comments: DILATATION & CURETTAGE/HYSTEROSCOPY WITH MYOSURE (N/A) - POSSIBLE MYOMECTOMY VS POLYPECTOMY WITH MYOSURE  SURGEON:  Surgeon(s) and Role:    Christophe Louis, MD - Primary  PHYSICIAN ASSISTANT:   ASSISTANTS: none   ANESTHESIA:   general  EBL:  5 mL   BLOOD ADMINISTERED:none  DRAINS: none   LOCAL MEDICATIONS USED:  MARCAINE     SPECIMEN:  Source of Specimen:  endometrial currettings and synechia( adhesion)   DISPOSITION OF SPECIMEN:  PATHOLOGY  COUNTS:  YES  TOURNIQUET:  * No tourniquets in log *  DICTATION: .Dragon Dictation  PLAN OF CARE: Discharge to home after PACU  PATIENT DISPOSITION:  PACU - hemodynamically stable.   Delay start of Pharmacological VTE agent (>24hrs) due to surgical blood loss or risk of bleeding: not applicable   Findings: normal external genitalia. Normal vaginal mucosa, normal cervix  Atrophic endometrial lining. Synechiae at the lower uterine segment and cervical junction.   Procedure: Patient was taken to the operating room where she was placed under general anesthesia. She was placed in the dorsal lithotomy position. She was prepped and draped in the usual sterile fashion.Time out was performed and patient was identified as Katrina [name].  A speculum was placed into the vaginal vault. The anterior lip of the cervix was grasped with a single-tooth tenaculum. The cervix was then sounded to 6 cm. The cervix was dilated to approximately 6 mm. Diagnostic hysteroscope was inserted. The findings noted above. The hysteroscope was removed. Sharp curet was introduced and endometrial corrected curettings were obtained. The hysteroscope was then reinserted. There was no evidence of endometrial  polyps however endometrial synechiae was noted at the lower uterine segment and cervical junction. Lite myosure blade was inserted and the synechiae (adhesion) was removed. . There was no evidence of perforation. Hysteroscope was then removed.Quarter percent Marcaine was injected at the 4 and 8:00 positions of the cervix. The single-tooth tenaculum was removed from the anterior lip of the cervix. Patient was noted to have bleeding from the tenaculum site. Silver nitrate was applied and excellent hemostasis was noted. The speculum was removed from the patient's vagina. She was awakened from anesthesia taken care at country room awake and in stable condition. Sponge lap and needle counts were correct x2.

## 2017-12-10 NOTE — H&P (Signed)
Date of Initial H&P: 12/10/2017  History reviewed, patient examined, no change in status, stable for surgery.

## 2017-12-10 NOTE — Transfer of Care (Signed)
Immediate Anesthesia Transfer of Care Note  Patient: Katrina [name]  Procedure(s) Performed: DILATATION & CURETTAGE/HYSTEROSCOPY WITH MYOSURE (N/A Vagina )  Patient Location: PACU  Anesthesia Type:General  Level of Consciousness: awake, alert  and oriented  Airway & Oxygen Therapy: Patient Spontanous Breathing and Patient connected to nasal cannula oxygen  Post-op Assessment: Report given to RN and Post -op Vital signs reviewed and stable  Post vital signs: Reviewed and stable  Last Vitals:  Vitals Value Taken Time  BP 152/75 12/10/2017  1:51 PM  Temp    Pulse 98 12/10/2017  1:55 PM  Resp 18 12/10/2017  1:55 PM  SpO2 97 % 12/10/2017  1:55 PM  Vitals shown include unvalidated device data.  Last Pain:  Vitals:   12/10/17 1108  TempSrc: Oral  PainSc: 1       Patients Stated Pain Goal: 5 (59/97/74 1423)  Complications: No apparent anesthesia complications

## 2017-12-10 NOTE — H&P (Deleted)
  The note originally documented on this encounter has been moved the the encounter in which it belongs.  

## 2017-12-12 NOTE — Anesthesia Postprocedure Evaluation (Signed)
Anesthesia Post Note  Patient: Katrina [name]  Procedure(s) Performed: DILATATION & CURETTAGE/HYSTEROSCOPY WITH MYOSURE (N/A Vagina )     Patient location during evaluation: PACU Anesthesia Type: General Level of consciousness: awake Pain management: pain level controlled Vital Signs Assessment: post-procedure vital signs reviewed and stable Respiratory status: spontaneous breathing Cardiovascular status: stable Postop Assessment: no apparent nausea or vomiting Anesthetic complications: no    Last Vitals:  Vitals:   12/10/17 1415 12/10/17 1430  BP: (!) 154/84 (!) 141/94  Pulse: 86 92  Resp: 13 17  Temp:    SpO2: 96% 95%    Last Pain:  Vitals:   12/11/17 1519  TempSrc:   PainSc: 0-No pain   Pain Goal: Patients Stated Pain Goal: 5 (12/10/17 1108)               [place]

## 2017-12-13 ENCOUNTER — Encounter (HOSPITAL_COMMUNITY): Payer: Self-pay | Admitting: Obstetrics and Gynecology

## 2017-12-24 DIAGNOSIS — N898 Other specified noninflammatory disorders of vagina: Secondary | ICD-10-CM | POA: Diagnosis not present

## 2017-12-24 DIAGNOSIS — N950 Postmenopausal bleeding: Secondary | ICD-10-CM | POA: Diagnosis not present

## 2018-01-18 DIAGNOSIS — N898 Other specified noninflammatory disorders of vagina: Secondary | ICD-10-CM | POA: Diagnosis not present

## 2018-01-18 DIAGNOSIS — R102 Pelvic and perineal pain: Secondary | ICD-10-CM | POA: Diagnosis not present

## 2018-01-18 DIAGNOSIS — N950 Postmenopausal bleeding: Secondary | ICD-10-CM | POA: Diagnosis not present

## 2018-02-04 DIAGNOSIS — R102 Pelvic and perineal pain: Secondary | ICD-10-CM | POA: Diagnosis not present

## 2018-02-04 DIAGNOSIS — N950 Postmenopausal bleeding: Secondary | ICD-10-CM | POA: Diagnosis not present

## 2018-04-27 DIAGNOSIS — D250 Submucous leiomyoma of uterus: Secondary | ICD-10-CM | POA: Diagnosis not present

## 2018-04-27 DIAGNOSIS — N898 Other specified noninflammatory disorders of vagina: Secondary | ICD-10-CM | POA: Diagnosis not present

## 2018-04-27 DIAGNOSIS — N950 Postmenopausal bleeding: Secondary | ICD-10-CM | POA: Diagnosis not present

## 2018-04-27 DIAGNOSIS — R102 Pelvic and perineal pain: Secondary | ICD-10-CM | POA: Diagnosis not present

## 2018-05-27 DIAGNOSIS — N950 Postmenopausal bleeding: Secondary | ICD-10-CM | POA: Diagnosis not present

## 2018-05-27 DIAGNOSIS — R102 Pelvic and perineal pain: Secondary | ICD-10-CM | POA: Diagnosis not present

## 2018-05-27 DIAGNOSIS — N898 Other specified noninflammatory disorders of vagina: Secondary | ICD-10-CM | POA: Diagnosis not present

## 2018-06-08 NOTE — Patient Instructions (Addendum)
Katrina [name]  06/08/2018     Your procedure is scheduled on 06-16-18    Report to [place]  at  10:23 A.M.    Call this number if you have problems the morning of surgery:[phone]   OUR ADDRESS IS [address], WE ARE LOCATED IN THE [facility] WITH [facility].   Remember:  Do not eat food or drink liquids after midnight.   Take these medicines the morning of surgery with A SIP OF WATER:  None   Do not wear jewelry, make-up or nail polish.  Do not wear lotions, powders, or perfumes, or deoderant.  Do not shave 48 hours prior to surgery.    Do not bring valuables to the hospital.  [facility] is not responsible for any belongings or valuables.  Contacts, dentures or bridgework may not be worn into surgery.  Leave your suitcase in the car.  After surgery it may be brought to your room.  For patients admitted to the hospital, discharge time will be determined by your treatment team.  Patients discharged the day of surgery will not be allowed to drive home.   Special instructions:   Please read over the following fact sheets that you were given:       [facility] - Preparing for Surgery Before surgery, you can play an important role.  Because skin is not sterile, your skin needs to be as free of germs as possible.  You can reduce the number of germs on your skin by washing with CHG (chlorahexidine gluconate) soap before surgery.  CHG is an antiseptic cleaner which kills germs and bonds with the skin to continue killing germs even after washing. Please DO NOT use if you have an allergy to CHG or antibacterial soaps.  If your skin becomes reddened/irritated stop using the CHG and inform your nurse when you arrive at Short Stay. Do not shave (including legs and underarms) for at least 48 hours prior to the first CHG shower.  You may shave your face/neck. Please follow these instructions carefully:  1.  Shower with CHG Soap the night before  surgery and the  morning of Surgery.  2.  If you choose to wash your hair, wash your hair first as usual with your  normal  shampoo.  3.  After you shampoo, rinse your hair and body thoroughly to remove the  shampoo.                           4.  Use CHG as you would any other liquid soap.  You can apply chg directly  to the skin and wash                       Gently with a scrungie or clean washcloth.  5.  Apply the CHG Soap to your body ONLY FROM THE NECK DOWN.   Do not use on face/ open                           Wound or open sores. Avoid contact with eyes, ears mouth and genitals (private parts).                       Wash face,  Genitals (private parts) with your normal soap.  6.  Wash thoroughly, paying special attention to the area where your surgery  will be performed.  7.  Thoroughly rinse your body with warm water from the neck down.  8.  DO NOT shower/wash with your normal soap after using and rinsing off  the CHG Soap.                9.  Pat yourself dry with a clean towel.            10.  Wear clean pajamas.            11.  Place clean sheets on your bed the night of your first shower and do not  sleep with pets. Day of Surgery : Do not apply any lotions/deodorants the morning of surgery.  Please wear clean clothes to the hospital/surgery center.  FAILURE TO FOLLOW THESE INSTRUCTIONS MAY RESULT IN THE CANCELLATION OF YOUR SURGERY PATIENT SIGNATURE_________________________________  NURSE SIGNATURE__________________________________  ________________________________________________________________________

## 2018-06-08 NOTE — Progress Notes (Signed)
Please place surgical orders. Pt is scheduled for her PAT appointment on 06/10/18. Thank you.

## 2018-06-08 NOTE — Progress Notes (Signed)
12-02-17 (Epic) EKG

## 2018-06-10 ENCOUNTER — Other Ambulatory Visit: Payer: Self-pay

## 2018-06-10 ENCOUNTER — Encounter (HOSPITAL_COMMUNITY): Payer: Self-pay

## 2018-06-10 ENCOUNTER — Encounter (HOSPITAL_COMMUNITY)
Admission: RE | Admit: 2018-06-10 | Discharge: 2018-06-10 | Disposition: A | Discharge status: Home / Self Care | Payer: Medicare Other | Source: Ambulatory Visit | Attending: Obstetrics and Gynecology | Admitting: Obstetrics and Gynecology

## 2018-06-10 DIAGNOSIS — Z01812 Encounter for preprocedural laboratory examination: Secondary | ICD-10-CM | POA: Insufficient documentation

## 2018-06-10 LAB — CBC
HCT: 44.9 % (ref 36.0–46.0)
Hemoglobin: 14.5 g/dL (ref 12.0–15.0)
MCH: 32.8 pg (ref 26.0–34.0)
MCHC: 32.3 g/dL (ref 30.0–36.0)
MCV: 101.6 fL — ABNORMAL HIGH (ref 80.0–100.0)
NRBC: 0 % (ref 0.0–0.2)
PLATELETS: 310 10*3/uL (ref 150–400)
RBC: 4.42 MIL/uL (ref 3.87–5.11)
RDW: 13.2 % (ref 11.5–15.5)
WBC: 11 10*3/uL — AB (ref 4.0–10.5)

## 2018-06-10 LAB — BASIC METABOLIC PANEL
ANION GAP: 6 (ref 5–15)
BUN: 13 mg/dL (ref 8–23)
CALCIUM: 9.4 mg/dL (ref 8.9–10.3)
CHLORIDE: 106 mmol/L (ref 98–111)
CO2: 27 mmol/L (ref 22–32)
Creatinine, Ser: 0.87 mg/dL (ref 0.44–1.00)
GFR calc non Af Amer: >60 mL/min (ref >60)
Glucose, Bld: 101 mg/dL — ABNORMAL HIGH (ref 70–99)
Potassium: 4.4 mmol/L (ref 3.5–5.1)
SODIUM: 139 mmol/L (ref 135–145)

## 2018-06-15 ENCOUNTER — Other Ambulatory Visit: Payer: Self-pay | Admitting: Obstetrics and Gynecology

## 2018-06-15 NOTE — H&P (Deleted)
  The note originally documented on this encounter has been moved the the encounter in which it belongs.  

## 2018-06-15 NOTE — H&P (Addendum)
Reason for Appointment  1. Postmenopausal bleeding and pelvic pain    History of Present Illness  General:  63 Katrina [name] presents for pre op visit Pt. is scheduled for a robotic assisted hysterectomy with bilateral salpingo-oophorectomy for management of postmenopausal bleeding and pelvic pain. She had a hsyterocscopy D&C 12/10/2017, pathology was benign however she is having intermittent sptotting and pelvic pain.  She had an u/s on 02/04/2018 that revealed 7 x 5 x 4cm uterus. posterior submucosal fibroid 1.7cm measuring and an anterior fibroid that is 1.4cm. Ovaries appeared normal bilaterally. Pt. states that when the cramps get really bad the blood gets really bright in color and the vaginal discharge gets worse.  Pt. drinks about 9 beers 2-3 days a week. She states that she drinks for the pain and to help with her sleeping Discussed with patient possibly having to do a bikini line incision pending if there is too much scar tissue. Upon pelvic examination.    Current Medications  Taking   Ibuprofen 800 MG Tablet 1 tablet with food or milk as needed Orally every 8 hrs as needed   tylenol 1 tab Oral , Notes: prn   Not-Taking   Megestrol Acetate 40 MG Tablet 1 tablet Orally Twice a day   Flagyl(metroNIDAZOLE) 500 MG Tablet 1 tablet Orally 2 times a day   Doxycycline Hyclate 100 MG Tablet 1 tablet Orally every 12 hrs   Medication List reviewed and reconciled with the patient    Past Medical History  Anxiety/depression.   Gastritis.   Hyperlipidemia.   congenital hip dysplasia, OA.   OA - knees - Dr. Ninfa Linden.    Surgical History  C section x2  back surgery, ruptured disc x2  knee surgery arthroscopy x2-left  carpal tunnel release left  foot surgery right  cholecystectomy   knee replacement, right 11/2012  hip replacement, left 04/2011  left elbow ulnar nerve surgery   left TKA 2018  right shoulder   exploratory laporoscopy, endometriosis   tubal ligation   hysteroscopy,  D&C, 12/10/2017   Family History  Father: deceased 57 yrs, cancer, unknown  Mother: alive, cervical cancer  Paternal [place] Father: deceased  Paternal [place] Mother: deceased  Maternal [place] Father: deceased, heart attack  Maternal Grand Mother: deceased  Brother 1: deceased, diabetes, PE  Brother2: deceased  Daughter(s): alive  2 brother(s) . 2daughter(s) - healthy.   neg fam hx for colon cancer or polyps, liver or kidney disease.   Social History  General:  Alcohol: yes, 9 beers 3-4 times a week. Children: 2, daughter (s). Seat belt use: yes. Tobacco use cigarettes: Current smoker, Frequency: 1.5 PPD between daughter , Tobacco history last updated 05/27/2018. Marital Status: married. no Recreational drug use. OCCUPATION: unemployed. Exercise: NONE.    Gyn History  Sexual activity not currently sexually active.  Periods : postmenopausal.  LMP 15 years ago .  Last pap smear date 09/25/2017-neg .  Last mammogram date 02/14/15-normal.    OB History  Number of pregnancies 2.  Pregnancy # 1 live birth, girl, C-section delivery.  Pregnancy # 2 live birth, C-section, girl.    Allergies  Codeine Sulfate: itch, vomitting   Hospitalization/Major Diagnostic Procedure  see surgery   cat bite 2018   Review of Systems  CONSTITUTIONAL:  Chills No. Fatigue No. Fever No. Night sweats No. Recent travel outside [place] No. Sweats No. Weight change No.  OPHTHALMOLOGY:  Blurring of vision no. Change in vision no. Double vision no.  ENT:  Dizziness no.  Nose bleeds no. Sore throat no. Teeth pain no.  ALLERGY:  Hives no.  CARDIOLOGY:  Chest pain no. High blood pressure no. Irregular heart beat no. Leg edema no. Palpitations no.  RESPIRATORY:  Shortness of breath no. Cough no. Wheezing no.  UROLOGY:  Pain with urination no. Urinary urgency no. Urinary frequency no. Urinary incontinence no. Difficulty urinating No. Blood in urine No.  GASTROENTEROLOGY:  Abdominal pain no. Appetite change no.  Bloating/belching no. Blood in stool or on toilet paper no. Change in bowel movements no. Constipation no. Diarrhea no. Difficulty swallowing no. Nausea no.  FEMALE REPRODUCTIVE:  Vulvar pain no. Vulvar rash no. Abnormal vaginal bleeding no. Breast pain no. Nipple discharge no. Pain with intercourse no. Pelvic pain no. Unusual vaginal discharge no. Vaginal itching no.  MUSCULOSKELETAL:  Muscle aches no.  NEUROLOGY:  Headache no. Tingling/numbness no. Weakness no.  PSYCHOLOGY:  Depression no. Anxiety no. Nervousness no. Sleep disturbances no. Suicidal ideation no .  ENDOCRINOLOGY:  Excessive thirst no. Excessive urination no. Hair loss no. Heat or cold intolerance no.  HEMATOLOGY/LYMPH:  Abnormal bleeding no. Easy bruising no. Swollen glands no.  DERMATOLOGY:  New/changing skin lesion no. Rash no. Sores no.  Negative except as stated in HPI.      Vital Signs  Wt 224, Wt change 2 lb, Ht 65.5, BMI 36.70, Pulse sitting 96, BP sitting 134/80.   Examination  General Examination: CONSTITUTIONAL: alert, oriented, NAD . SKIN: moist, warm. EYES: Conjunctiva clear. LUNGS: clear to auscultation bilaterally. HEART: regular rate and rhythm. ABDOMEN: soft, non-tender/non-distended, bowel sounds present . FEMALE GENITOURINARY: normal external genitalia,labia - unremarkable,vagina - pink moist mucosa, no lesions .. thin yellow discharge in vaginal vault,cervix - no discharge or lesions or CMT,adnexa - no masses or tenderness,uterus - nontender and normal size on palpation . EXTREMITIES: no edema present. PSYCH: affect normal, good eye contact.     Physical Examination  Chaperone present:  Chaperone present for pelvic exam, Chapman,Courtney 05/27/2018 09:Katrina:10 AM > .  Pt aware of scribe services today.   Assessments   1. Postmenopausal bleeding - N95.0 (Primary)   2. Pelvic pain - R10.2   3. Vaginal discharge - N89.8   Treatment  1. Postmenopausal bleeding  LAB: CBC with Diff Notes: Pt. is  scheduled for a robotic assisted hysterectomy with bilateral salpingo-oophorectomy. Discussed risk of surgery with patient including but not limited to infection/bleeding, damage to bowel, bladder, ureters and surrounding organs with the need for further surgery. Risk of conversion to open abdominal hysterectomy discussed.  Discussed risk of blood transfusion. Discussed risk of hiv/hep b&c with blood transfusion. Patient is aware of risks and wants blood transfusion is warranted. Pt. advised to avoid Ibuprofen, Aleve, Advil, or anything with an anti-inflammatory in it ten days prior to surgery. Pt. advised to not eat or drink night prior to surgery.    2. Pelvic pain  Notes: Pt. is scheduled for a robotic assisted hysterectomy with bilateral salpingo-oophorectomy.     3) vaginal discharge .. wet mount negative for bv/ yeast or trich

## 2018-06-16 ENCOUNTER — Encounter (HOSPITAL_BASED_OUTPATIENT_CLINIC_OR_DEPARTMENT_OTHER): Payer: Self-pay | Admitting: Certified Registered Nurse Anesthetist

## 2018-06-16 ENCOUNTER — Observation Stay (HOSPITAL_BASED_OUTPATIENT_CLINIC_OR_DEPARTMENT_OTHER)
Admission: RE | Admit: 2018-06-16 | Discharge: 2018-06-17 | Disposition: A | Discharge status: Home / Self Care | Payer: Medicare Other | Attending: Obstetrics and Gynecology | Admitting: Obstetrics and Gynecology

## 2018-06-16 ENCOUNTER — Encounter (HOSPITAL_BASED_OUTPATIENT_CLINIC_OR_DEPARTMENT_OTHER)
Admission: RE | Disposition: A | Discharge status: Home / Self Care | Payer: Self-pay | Source: Home / Self Care | Attending: Obstetrics and Gynecology

## 2018-06-16 ENCOUNTER — Ambulatory Visit (HOSPITAL_BASED_OUTPATIENT_CLINIC_OR_DEPARTMENT_OTHER): Payer: Medicare Other | Admitting: Certified Registered Nurse Anesthetist

## 2018-06-16 ENCOUNTER — Ambulatory Visit (HOSPITAL_BASED_OUTPATIENT_CLINIC_OR_DEPARTMENT_OTHER): Payer: Medicare Other | Admitting: Physician Assistant

## 2018-06-16 DIAGNOSIS — Z8049 Family history of malignant neoplasm of other genital organs: Secondary | ICD-10-CM | POA: Insufficient documentation

## 2018-06-16 DIAGNOSIS — K660 Peritoneal adhesions (postprocedural) (postinfection): Secondary | ICD-10-CM | POA: Insufficient documentation

## 2018-06-16 DIAGNOSIS — N898 Other specified noninflammatory disorders of vagina: Secondary | ICD-10-CM | POA: Insufficient documentation

## 2018-06-16 DIAGNOSIS — Z96653 Presence of artificial knee joint, bilateral: Secondary | ICD-10-CM | POA: Insufficient documentation

## 2018-06-16 DIAGNOSIS — N888 Other specified noninflammatory disorders of cervix uteri: Secondary | ICD-10-CM | POA: Diagnosis not present

## 2018-06-16 DIAGNOSIS — Z96642 Presence of left artificial hip joint: Secondary | ICD-10-CM | POA: Diagnosis not present

## 2018-06-16 DIAGNOSIS — J449 Chronic obstructive pulmonary disease, unspecified: Secondary | ICD-10-CM | POA: Diagnosis not present

## 2018-06-16 DIAGNOSIS — N800 Endometriosis of uterus: Secondary | ICD-10-CM | POA: Diagnosis not present

## 2018-06-16 DIAGNOSIS — D250 Submucous leiomyoma of uterus: Secondary | ICD-10-CM | POA: Diagnosis not present

## 2018-06-16 DIAGNOSIS — F17210 Nicotine dependence, cigarettes, uncomplicated: Secondary | ICD-10-CM | POA: Diagnosis not present

## 2018-06-16 DIAGNOSIS — R102 Pelvic and perineal pain: Secondary | ICD-10-CM | POA: Diagnosis not present

## 2018-06-16 DIAGNOSIS — N950 Postmenopausal bleeding: Secondary | ICD-10-CM | POA: Diagnosis present

## 2018-06-16 DIAGNOSIS — Z90710 Acquired absence of both cervix and uterus: Secondary | ICD-10-CM | POA: Diagnosis present

## 2018-06-16 HISTORY — PX: ROBOTIC ASSISTED TOTAL HYSTERECTOMY WITH BILATERAL SALPINGO OOPHERECTOMY: SHX6086

## 2018-06-16 LAB — CBC
HCT: 45.2 % (ref 36.0–46.0)
Hemoglobin: 14.5 g/dL (ref 12.0–15.0)
MCH: 32.7 pg (ref 26.0–34.0)
MCHC: 32.1 g/dL (ref 30.0–36.0)
MCV: 101.8 fL — ABNORMAL HIGH (ref 80.0–100.0)
Platelets: 288 10*3/uL (ref 150–400)
RBC: 4.44 MIL/uL (ref 3.87–5.11)
RDW: 13.2 % (ref 11.5–15.5)
WBC: 10 10*3/uL (ref 4.0–10.5)
nRBC: 0 % (ref 0.0–0.2)

## 2018-06-16 LAB — TYPE AND SCREEN
ABO/RH(D): O POS
Antibody Screen: NEGATIVE

## 2018-06-16 SURGERY — HYSTERECTOMY, TOTAL, ROBOT-ASSISTED, LAPAROSCOPIC, WITH BILATERAL SALPINGO-OOPHORECTOMY
Anesthesia: General | Laterality: Bilateral

## 2018-06-16 MED ORDER — ONDANSETRON HCL 4 MG/2ML IJ SOLN
INTRAMUSCULAR | Status: AC
  Filled 2018-06-16: qty 2

## 2018-06-16 MED ORDER — ONDANSETRON HCL 4 MG/2ML IJ SOLN
4.0000 mg | Freq: Four times a day (QID) | INTRAMUSCULAR | Status: DC | PRN
  Filled 2018-06-16: qty 2

## 2018-06-16 MED ORDER — IBUPROFEN 800 MG PO TABS
800.0000 mg | ORAL_TABLET | Freq: Four times a day (QID) | ORAL | Status: DC
  Filled 2018-06-16: qty 1

## 2018-06-16 MED ORDER — OXYCODONE HCL 5 MG PO TABS
5.0000 mg | ORAL_TABLET | ORAL | Status: DC | PRN
  Administered 2018-06-16 (×2): 5 mg via ORAL
  Administered 2018-06-17: 10 mg via ORAL
  Filled 2018-06-16: qty 2

## 2018-06-16 MED ORDER — HYDROMORPHONE HCL 1 MG/ML IJ SOLN
INTRAMUSCULAR | Status: AC
  Filled 2018-06-16: qty 1

## 2018-06-16 MED ORDER — ROCURONIUM BROMIDE 100 MG/10ML IV SOLN
INTRAVENOUS | Status: AC
  Filled 2018-06-16: qty 1

## 2018-06-16 MED ORDER — PHENYLEPHRINE 40 MCG/ML (10ML) SYRINGE FOR IV PUSH (FOR BLOOD PRESSURE SUPPORT)
PREFILLED_SYRINGE | INTRAVENOUS | Status: AC
  Filled 2018-06-16: qty 30

## 2018-06-16 MED ORDER — FENTANYL CITRATE (PF) 100 MCG/2ML IJ SOLN
INTRAMUSCULAR | Status: DC | PRN
  Administered 2018-06-16: 50 ug via INTRAVENOUS
  Administered 2018-06-16 (×2): 100 ug via INTRAVENOUS
  Administered 2018-06-16 (×2): 50 ug via INTRAVENOUS

## 2018-06-16 MED ORDER — STERILE WATER FOR IRRIGATION IR SOLN
Status: DC | PRN
  Administered 2018-06-16: 200 mL via INTRAVESICAL

## 2018-06-16 MED ORDER — ACETAMINOPHEN 500 MG PO TABS
ORAL_TABLET | ORAL | Status: AC
  Filled 2018-06-16: qty 2

## 2018-06-16 MED ORDER — PROMETHAZINE HCL 25 MG/ML IJ SOLN
6.2500 mg | INTRAMUSCULAR | Status: DC | PRN
  Filled 2018-06-16: qty 1

## 2018-06-16 MED ORDER — SODIUM CHLORIDE 0.9 % IV SOLN
2.0000 g | INTRAVENOUS | Status: AC
  Administered 2018-06-16: 2 g via INTRAVENOUS
  Filled 2018-06-16: qty 2

## 2018-06-16 MED ORDER — OXYCODONE HCL 5 MG PO TABS
ORAL_TABLET | ORAL | Status: AC
  Filled 2018-06-16: qty 1

## 2018-06-16 MED ORDER — SUGAMMADEX SODIUM 200 MG/2ML IV SOLN
INTRAVENOUS | Status: AC
  Filled 2018-06-16: qty 2

## 2018-06-16 MED ORDER — SODIUM CHLORIDE 0.9 % IR SOLN
Status: DC | PRN
  Administered 2018-06-16: 1000 mL

## 2018-06-16 MED ORDER — LACTATED RINGERS IV SOLN
INTRAVENOUS | Status: DC
  Administered 2018-06-16 (×2): via INTRAVENOUS
  Filled 2018-06-16: qty 1000

## 2018-06-16 MED ORDER — HYDROMORPHONE HCL 1 MG/ML IJ SOLN
0.2000 mg | INTRAMUSCULAR | Status: DC | PRN
  Filled 2018-06-16: qty 1

## 2018-06-16 MED ORDER — LIDOCAINE 2% (20 MG/ML) 5 ML SYRINGE
INTRAMUSCULAR | Status: AC
  Filled 2018-06-16: qty 5

## 2018-06-16 MED ORDER — SUGAMMADEX SODIUM 200 MG/2ML IV SOLN
INTRAVENOUS | Status: DC | PRN
  Administered 2018-06-16: 200 mg via INTRAVENOUS

## 2018-06-16 MED ORDER — SENNA 8.6 MG PO TABS
1.0000 | ORAL_TABLET | Freq: Two times a day (BID) | ORAL | Status: DC
  Administered 2018-06-16: 8.6 mg via ORAL
  Filled 2018-06-16: qty 1

## 2018-06-16 MED ORDER — PANTOPRAZOLE SODIUM 40 MG PO TBEC
40.0000 mg | DELAYED_RELEASE_TABLET | Freq: Every day | ORAL | Status: DC
  Administered 2018-06-16: 40 mg via ORAL
  Filled 2018-06-16: qty 1

## 2018-06-16 MED ORDER — FENTANYL CITRATE (PF) 250 MCG/5ML IJ SOLN
INTRAMUSCULAR | Status: AC
  Filled 2018-06-16: qty 5

## 2018-06-16 MED ORDER — SENNA 8.6 MG PO TABS
ORAL_TABLET | ORAL | Status: AC
  Filled 2018-06-16: qty 1

## 2018-06-16 MED ORDER — ALBUTEROL SULFATE HFA 108 (90 BASE) MCG/ACT IN AERS
INHALATION_SPRAY | RESPIRATORY_TRACT | Status: DC | PRN
  Administered 2018-06-16 (×2): 2 via RESPIRATORY_TRACT

## 2018-06-16 MED ORDER — ONDANSETRON HCL 4 MG PO TABS
4.0000 mg | ORAL_TABLET | Freq: Four times a day (QID) | ORAL | Status: DC | PRN
  Filled 2018-06-16: qty 1

## 2018-06-16 MED ORDER — ALUM & MAG HYDROXIDE-SIMETH 200-200-20 MG/5ML PO SUSP
30.0000 mL | ORAL | Status: DC | PRN
  Filled 2018-06-16: qty 30

## 2018-06-16 MED ORDER — METOCLOPRAMIDE HCL 5 MG/ML IJ SOLN: INTRAMUSCULAR | Status: DC | PRN

## 2018-06-16 MED ORDER — ONDANSETRON HCL 4 MG/2ML IJ SOLN
INTRAMUSCULAR | Status: DC | PRN
  Administered 2018-06-16 (×2): 4 mg via INTRAVENOUS

## 2018-06-16 MED ORDER — ALBUTEROL SULFATE HFA 108 (90 BASE) MCG/ACT IN AERS
INHALATION_SPRAY | RESPIRATORY_TRACT | Status: AC
  Filled 2018-06-16: qty 6.7

## 2018-06-16 MED ORDER — LABETALOL HCL 5 MG/ML IV SOLN
INTRAVENOUS | Status: AC
  Filled 2018-06-16: qty 4

## 2018-06-16 MED ORDER — MENTHOL 3 MG MT LOZG
1.0000 | LOZENGE | OROMUCOSAL | Status: DC | PRN
  Filled 2018-06-16: qty 9

## 2018-06-16 MED ORDER — LABETALOL HCL 5 MG/ML IV SOLN
INTRAVENOUS | Status: DC | PRN
  Administered 2018-06-16: 5 mg via INTRAVENOUS

## 2018-06-16 MED ORDER — DEXAMETHASONE SODIUM PHOSPHATE 10 MG/ML IJ SOLN
INTRAMUSCULAR | Status: DC | PRN
  Administered 2018-06-16: 10 mg via INTRAVENOUS

## 2018-06-16 MED ORDER — KETOROLAC TROMETHAMINE 30 MG/ML IJ SOLN
INTRAMUSCULAR | Status: AC
  Filled 2018-06-16: qty 1

## 2018-06-16 MED ORDER — PHENYLEPHRINE 40 MCG/ML (10ML) SYRINGE FOR IV PUSH (FOR BLOOD PRESSURE SUPPORT)
PREFILLED_SYRINGE | INTRAVENOUS | Status: DC | PRN
  Administered 2018-06-16 (×3): 80 ug via INTRAVENOUS
  Administered 2018-06-16: 40 ug via INTRAVENOUS
  Administered 2018-06-16 (×3): 80 ug via INTRAVENOUS

## 2018-06-16 MED ORDER — HYDROMORPHONE HCL 1 MG/ML IJ SOLN
0.2500 mg | INTRAMUSCULAR | Status: DC | PRN
  Administered 2018-06-16 (×4): 0.5 mg via INTRAVENOUS
  Filled 2018-06-16: qty 0.5

## 2018-06-16 MED ORDER — FENTANYL CITRATE (PF) 100 MCG/2ML IJ SOLN
INTRAMUSCULAR | Status: AC
  Filled 2018-06-16: qty 2

## 2018-06-16 MED ORDER — LACTATED RINGERS IV SOLN
INTRAVENOUS | Status: DC
  Filled 2018-06-16: qty 1000

## 2018-06-16 MED ORDER — MIDAZOLAM HCL 2 MG/2ML IJ SOLN
INTRAMUSCULAR | Status: AC
  Filled 2018-06-16: qty 2

## 2018-06-16 MED ORDER — LIDOCAINE 2% (20 MG/ML) 5 ML SYRINGE
INTRAMUSCULAR | Status: DC | PRN
  Administered 2018-06-16: 100 mg via INTRAVENOUS

## 2018-06-16 MED ORDER — ACETAMINOPHEN 325 MG PO TABS
650.0000 mg | ORAL_TABLET | ORAL | Status: DC | PRN
  Filled 2018-06-16: qty 2

## 2018-06-16 MED ORDER — ACETAMINOPHEN 500 MG PO TABS
1000.0000 mg | ORAL_TABLET | ORAL | Status: AC
  Administered 2018-06-16: 1000 mg via ORAL
  Filled 2018-06-16: qty 2

## 2018-06-16 MED ORDER — MIDAZOLAM HCL 2 MG/2ML IJ SOLN
INTRAMUSCULAR | Status: DC | PRN
  Administered 2018-06-16: 2 mg via INTRAVENOUS

## 2018-06-16 MED ORDER — SODIUM CHLORIDE 0.9 % IV SOLN
INTRAVENOUS | Status: AC
  Filled 2018-06-16: qty 2

## 2018-06-16 MED ORDER — SIMETHICONE 80 MG PO CHEW
80.0000 mg | CHEWABLE_TABLET | Freq: Four times a day (QID) | ORAL | Status: DC | PRN
  Filled 2018-06-16: qty 1

## 2018-06-16 MED ORDER — ROCURONIUM BROMIDE 50 MG/5ML IV SOSY
PREFILLED_SYRINGE | INTRAVENOUS | Status: DC | PRN
  Administered 2018-06-16: 10 mg via INTRAVENOUS
  Administered 2018-06-16: 70 mg via INTRAVENOUS

## 2018-06-16 MED ORDER — ZOLPIDEM TARTRATE 5 MG PO TABS
5.0000 mg | ORAL_TABLET | Freq: Every evening | ORAL | Status: DC | PRN
  Filled 2018-06-16: qty 1

## 2018-06-16 MED ORDER — KETOROLAC TROMETHAMINE 30 MG/ML IJ SOLN
INTRAMUSCULAR | Status: DC | PRN
  Administered 2018-06-16: 30 mg via INTRAVENOUS

## 2018-06-16 MED ORDER — SODIUM CHLORIDE 0.9 % IV SOLN
INTRAVENOUS | Status: DC | PRN
  Administered 2018-06-16: 10 mL
  Administered 2018-06-16: 110 mL

## 2018-06-16 MED ORDER — DEXAMETHASONE SODIUM PHOSPHATE 10 MG/ML IJ SOLN
INTRAMUSCULAR | Status: AC
  Filled 2018-06-16: qty 1

## 2018-06-16 MED ORDER — PROPOFOL 10 MG/ML IV BOLUS
INTRAVENOUS | Status: DC | PRN
  Administered 2018-06-16: 200 mg via INTRAVENOUS

## 2018-06-16 MED ORDER — KETOROLAC TROMETHAMINE 30 MG/ML IJ SOLN
30.0000 mg | Freq: Four times a day (QID) | INTRAMUSCULAR | Status: DC
  Administered 2018-06-16 – 2018-06-17 (×3): 30 mg via INTRAVENOUS
  Filled 2018-06-16: qty 1

## 2018-06-16 MED ORDER — PANTOPRAZOLE SODIUM 40 MG PO TBEC
DELAYED_RELEASE_TABLET | ORAL | Status: AC
  Filled 2018-06-16: qty 1

## 2018-06-16 SURGICAL SUPPLY — 60 items
ADH SKN CLS APL DERMABOND .7 (GAUZE/BANDAGES/DRESSINGS) ×1
APL SRG 38 LTWT LNG FL B (MISCELLANEOUS)
APPLICATOR ARISTA FLEXITIP XL (MISCELLANEOUS) IMPLANT
BARRIER ADHS 3X4 INTERCEED (GAUZE/BANDAGES/DRESSINGS) IMPLANT
BRR ADH 4X3 ABS CNTRL BYND (GAUZE/BANDAGES/DRESSINGS)
CATH FOLEY 3WAY  5CC 16FR (CATHETERS)
CATH FOLEY 3WAY 5CC 16FR (CATHETERS) ×1 IMPLANT
COVER BACK TABLE 60X90IN (DRAPES) ×2 IMPLANT
COVER TIP SHEARS 8 DVNC (MISCELLANEOUS) ×1 IMPLANT
COVER TIP SHEARS 8MM DA VINCI (MISCELLANEOUS) ×1
DECANTER SPIKE VIAL GLASS SM (MISCELLANEOUS) ×2 IMPLANT
DEFOGGER SCOPE WARMER CLEARIFY (MISCELLANEOUS) ×2 IMPLANT
DERMABOND ADVANCED (GAUZE/BANDAGES/DRESSINGS) ×1
DERMABOND ADVANCED .7 DNX12 (GAUZE/BANDAGES/DRESSINGS) ×1 IMPLANT
DILATOR CANAL MILEX (MISCELLANEOUS) ×1 IMPLANT
DRAPE ARM DVNC X/XI (DISPOSABLE) ×4 IMPLANT
DRAPE COLUMN DVNC XI (DISPOSABLE) ×1 IMPLANT
DRAPE DA VINCI XI ARM (DISPOSABLE) ×4
DRAPE DA VINCI XI COLUMN (DISPOSABLE) ×1
DURAPREP 26ML APPLICATOR (WOUND CARE) ×2 IMPLANT
ELECT REM PT RETURN 9FT ADLT (ELECTROSURGICAL) ×2
ELECTRODE REM PT RTRN 9FT ADLT (ELECTROSURGICAL) ×1 IMPLANT
GLOVE BIO SURGEON STRL SZ7 (GLOVE) ×2 IMPLANT
GLOVE BIOGEL M 6.5 STRL (GLOVE) ×6 IMPLANT
GLOVE BIOGEL PI IND STRL 7.0 (GLOVE) ×6 IMPLANT
GLOVE BIOGEL PI INDICATOR 7.0 (GLOVE) ×6
HEMOSTAT ARISTA ABSORB 3G PWDR (HEMOSTASIS) IMPLANT
IRRIG SUCT STRYKERFLOW 2 WTIP (MISCELLANEOUS) ×2
IRRIGATION SUCT STRKRFLW 2 WTP (MISCELLANEOUS) ×1 IMPLANT
LEGGING LITHOTOMY PAIR STRL (DRAPES) ×2 IMPLANT
OBTURATOR OPTICAL STANDARD 8MM (TROCAR) ×1
OBTURATOR OPTICAL STND 8 DVNC (TROCAR) ×1
OBTURATOR OPTICALSTD 8 DVNC (TROCAR) IMPLANT
OCCLUDER COLPOPNEUMO (BALLOONS) ×2 IMPLANT
PACK ROBOT WH (CUSTOM PROCEDURE TRAY) ×2 IMPLANT
PACK ROBOTIC GOWN (GOWN DISPOSABLE) ×2 IMPLANT
PACK TRENDGUARD 450 HYBRID PRO (MISCELLANEOUS) IMPLANT
PAD PREP 24X48 CUFFED NSTRL (MISCELLANEOUS) ×2 IMPLANT
PROTECTOR NERVE ULNAR (MISCELLANEOUS) ×2 IMPLANT
SEAL CANN UNIV 5-8 DVNC XI (MISCELLANEOUS) ×4 IMPLANT
SEAL XI 5MM-8MM UNIVERSAL (MISCELLANEOUS) ×4
SEALER VESSEL DA VINCI XI (MISCELLANEOUS) ×1
SEALER VESSEL EXT DVNC XI (MISCELLANEOUS) IMPLANT
SET CYSTO W/LG BORE CLAMP LF (SET/KITS/TRAYS/PACK) ×1 IMPLANT
SET TRI-LUMEN FLTR TB AIRSEAL (TUBING) ×1 IMPLANT
SUT VIC AB 0 CT1 27 (SUTURE) ×6
SUT VIC AB 0 CT1 27XBRD ANBCTR (SUTURE) ×2 IMPLANT
SUT VICRYL 0 UR6 27IN ABS (SUTURE) IMPLANT
SUT VICRYL RAPIDE 4/0 PS 2 (SUTURE) ×5 IMPLANT
SUT VLOC 180 0 9IN  GS21 (SUTURE) ×1
SUT VLOC 180 0 9IN GS21 (SUTURE) ×1 IMPLANT
TIP RUMI ORANGE 6.7MMX12CM (TIP) IMPLANT
TIP UTERINE 5.1X6CM LAV DISP (MISCELLANEOUS) ×1 IMPLANT
TIP UTERINE 6.7X10CM GRN DISP (MISCELLANEOUS) IMPLANT
TIP UTERINE 6.7X6CM WHT DISP (MISCELLANEOUS) ×1 IMPLANT
TIP UTERINE 6.7X8CM BLUE DISP (MISCELLANEOUS) IMPLANT
TOWEL OR 17X26 10 PK STRL BLUE (TOWEL DISPOSABLE) ×4 IMPLANT
TRENDGUARD 450 HYBRID PRO PACK (MISCELLANEOUS) ×2
TROCAR PORT AIRSEAL 8X120 (TROCAR) ×2 IMPLANT
WATER STERILE IRR 1000ML POUR (IV SOLUTION) ×1 IMPLANT

## 2018-06-16 NOTE — Transfer of Care (Signed)
Immediate Anesthesia Transfer of Care Note  Patient: Katrina [name]  Procedure(s) Performed: XI ROBOTIC ASSISTED TOTAL HYSTERECTOMY WITH RIGHT SALPINGO OOPHORECTOMY (Bilateral )  Patient Location: PACU  Anesthesia Type:General  Level of Consciousness: awake, alert  and oriented  Airway & Oxygen Therapy: Patient Spontanous Breathing and Patient connected to face mask oxygen  Post-op Assessment: Report given to RN and Post -op Vital signs reviewed and stable  Post vital signs: Reviewed and stable  Last Vitals:  Vitals Value Taken Time  BP 145/79 06/16/2018  3:34 PM  Temp    Pulse 90 06/16/2018  3:37 PM  Resp 13 06/16/2018  3:37 PM  SpO2 94 % 06/16/2018  3:37 PM  Vitals shown include unvalidated device data.  Last Pain:  Vitals:   06/16/18 1050  TempSrc:   PainSc: 0-No pain      Patients Stated Pain Goal: 4 (25/00/37 0488)  Complications: No apparent anesthesia complications

## 2018-06-16 NOTE — H&P (Signed)
Date of Initial H&P:06/15/2018 History reviewed, patient examined, no change in status, stable for surgery.

## 2018-06-16 NOTE — Anesthesia Procedure Notes (Signed)
Procedure Name: Intubation Date/Time: 06/16/2018 12:55 PM Performed by: Genelle Bal, CRNA Pre-anesthesia Checklist: Patient identified, Emergency Drugs available, Suction available and Patient being monitored Patient Re-evaluated:Patient Re-evaluated prior to induction Oxygen Delivery Method: Circle system utilized Preoxygenation: Pre-oxygenation with 100% oxygen Induction Type: IV induction Ventilation: Mask ventilation without difficulty Laryngoscope Size: Miller and 2 Grade View: Grade I Tube type: Oral Tube size: 7.0 mm Number of attempts: 1 Airway Equipment and Method: Stylet Placement Confirmation: ETT inserted through vocal cords under direct vision,  positive ETCO2 and breath sounds checked- equal and bilateral Secured at: 21 cm Tube secured with: Tape Dental Injury: Teeth and Oropharynx as per pre-operative assessment

## 2018-06-16 NOTE — Anesthesia Postprocedure Evaluation (Signed)
Anesthesia Post Note  Patient: Katrina [name]  Procedure(s) Performed: XI ROBOTIC ASSISTED TOTAL HYSTERECTOMY WITH RIGHT SALPINGO OOPHORECTOMY (Bilateral )     Patient location during evaluation: PACU Anesthesia Type: General Level of consciousness: awake and alert Pain management: pain level controlled Vital Signs Assessment: post-procedure vital signs reviewed and stable Respiratory status: spontaneous breathing, nonlabored ventilation, respiratory function stable and patient connected to nasal cannula oxygen Cardiovascular status: blood pressure returned to baseline and stable Postop Assessment: no apparent nausea or vomiting Anesthetic complications: no    Last Vitals:  Vitals:   06/16/18 1600 06/16/18 1615  BP: (!) 147/108 131/84  Pulse: 86 90  Resp: 17 19  Temp:    SpO2: 98% 95%    Last Pain:  Vitals:   06/16/18 1615  TempSrc:   PainSc: 9                  Kaidyn Hernandes S

## 2018-06-16 NOTE — Anesthesia Preprocedure Evaluation (Addendum)
Anesthesia Evaluation  Patient identified by MRN, date of birth, ID band Patient awake    Reviewed: Allergy & Precautions, NPO status , Patient's Chart, lab work & pertinent test results  Airway Mallampati: II  TM Distance: >3 FB Neck ROM: Full    Dental no notable dental hx.    Pulmonary COPD, Current Smoker,    Pulmonary exam normal breath sounds clear to auscultation       Cardiovascular Normal cardiovascular exam+ dysrhythmias  Rhythm:Regular Rate:Normal  LBBB   Neuro/Psych Anxiety negative neurological ROS     GI/Hepatic Neg liver ROS, GERD  ,  Endo/Other  negative endocrine ROS  Renal/GU negative Renal ROS  negative genitourinary   Musculoskeletal negative musculoskeletal ROS (+)   Abdominal   Peds negative pediatric ROS (+)  Hematology negative hematology ROS (+)   Anesthesia Other Findings   Reproductive/Obstetrics negative OB ROS                            Anesthesia Physical Anesthesia Plan  ASA: III  Anesthesia Plan: General   Post-op Pain Management:    Induction: Intravenous  PONV Risk Score and Plan: 3 and Ondansetron, Dexamethasone, Scopolamine patch - Pre-op and Treatment may vary due to age or medical condition  Airway Management Planned: Oral ETT  Additional Equipment:   Intra-op Plan:   Post-operative Plan: Extubation in OR  Informed Consent: I have reviewed the patients History and Physical, chart, labs and discussed the procedure including the risks, benefits and alternatives for the proposed anesthesia with the patient or authorized representative who has indicated his/her understanding and acceptance.     Dental advisory given  Plan Discussed with: CRNA and Surgeon  Anesthesia Plan Comments:        Anesthesia Quick Evaluation

## 2018-06-16 NOTE — Op Note (Signed)
06/16/2018  3:34 PM  PATIENT:  Katrina [name]  63 y.o. 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 Janyth Pupa assisted due to concern for pelvic adhesive disease and the complexity of the surgery  ASSISTANTS: none   ANESTHESIA:   IV sedation  EBL:  100 mL   BLOOD ADMINISTERED:none  DRAINS: Urinary Catheter (Foley)   LOCAL MEDICATIONS USED:  OTHER ropivicaine  SPECIMEN:  Source of Specimen:  Uterus, cervix right fallopian tube and right ovary   DISPOSITION OF SPECIMEN:  PATHOLOGY  COUNTS:  YES  TOURNIQUET:  * No tourniquets in log *  DICTATION: .Dragon Dictation  PLAN OF CARE: Admit for overnight observation  PATIENT DISPOSITION:  PACU - hemodynamically stable.   Delay start of Pharmacological VTE agent (>24hrs) due to surgical blood loss or risk of bleeding: not applicable  Findings: normal appearing uterus. Dense adhesions of the colon to the left adnexa. I was unable to visualize the left fallopian tube and ovary due to dense adhesions of the colon to the left adnexa. The right fallopian tube and ovary appeared normal.   Procedure: The patient was taken to the operating room where she was placed under general anesthesia.Time out was performed. Marland Kitchen She was placed in dorsal lithotomy position and prepped and draped in the usual sterile fashion. A weighted speculum was placed into the vagina. A Deaver was placed anteriorly for retraction. The anterior lip of the cervix was grasped with a single-tooth tenaculum. The vaginal mucosa was injected with  2.5 cc of ropivacaine at the 2/4/ 8 and 10 o'clock positions. The uterus was sounded to 7cm. the cervix was dilated to 6 mm . 0 vicryl suture placed at the 12 and 6:00 positions Of the cervix to facilitate placement of a Ru mi uterine manipulator. The manipulator was placed without difficulty. Weighted speculum and Deaver were removed .  Attention was turned to the patient's abdomen where a 8 mm trocar was placed 2 cm above the umbilicus. under direct visualization . The pneumoperitoneum was achieved with PCO2 gas. The laparoscope was removed. 60 cc of ropivacaine were injected into the abdominal cavity. The laparoscope was reinserted. An 8 mm trocar was placed in the right upper quadrant 16 centimeters from the umbilicus.later connected to robotic arm #4). An 8MM incision was made in the Right upper quadrant TROCAR WAS PLACED 8 cm from the umbilicus. Later connected to robotic arm #3. An 8 mm incision was made in the left upper quadrant 16 cm from the umbilicus and connected to robot arm #1. Marland Kitchen Attention was turned to the left upper quadrant where a 8 mm midclavicular assistant trocar was placed. ( All incision sites were injected with 10cc of ropivacaine prior to port placement. )  Once all ports had been placed under direct visualization.The laparoscope was removed and the Martin robotic system was thin right-sided docked. The robotic arms were connected to the corresponding trocars as listed above. The laparoscope was then reinserted. The long tip bipolar forceps were placed into port #1. The monopolar scissor placed in the port #4. A vessel sealerwas placed in port #3.  All instruments were directed into the pelvis under direct visualization.  Attention was turned to the surgeons console.. The left utero-ovarian ligament was cauterized and transected with the vessel sealer The broad ligament was cauterized and transected with the vessel sealer .The round ligament was cauterized and transected with the vessel  sealer  The anterior leaf of broad ligament was incised along the bladder reflection to the midline.  The right  infundibulo-pelvic ligament was cauterized and transected with the vessel sealer. The right broad ligament was cauterized and transected with the vessel sealer. The right round ligament was cauterized and transected with the vessel sealer The broad ligament was incised to the midline. The bladder was filled with sterile water stained with methylene blue. Dense adhesions were noted between the bladder and the lower uterine segment. The bladder was dissected off the lower uterine segments of the cervix via sharp and blunt dissection.   The uterine arteries were skeleton bilaterally. They were  cauterized and transected with the vessel sealer The KOH ring was identified. The anterior colpotomy was performed followed by the posterior colpotomy. Once the uterus,cervix , right fallopian tube and ovary were completely excised they were removed through the vagina. The  bipolar forceps and scissors were removed and log tip forceps were placed in the port #1 and the cutting needle driver was placed in to port #3.  The vaginal cuff was closed with running suture if 0 v-lock. The pelvis was irrigated. Marland KitchenMarland KitchenMarland KitchenExcellent hemostasis was noted. Arista was placed along the vaginal cuff.  All pelvic pedicles were examined and hemostasis was noted.  All instruments removed from the ports. All ports were removed under direct Visualization. The pneumoperitoneum was released. The skin incisions were closed with 4-0 Vicryl and then covered with Derma bond.     Sponge lap and needle counts weIre correct x. The patient was awakened from anesthesia and taken to the recovery room in stable condition.

## 2018-06-17 DIAGNOSIS — N898 Other specified noninflammatory disorders of vagina: Secondary | ICD-10-CM | POA: Diagnosis not present

## 2018-06-17 DIAGNOSIS — N800 Endometriosis of uterus: Secondary | ICD-10-CM | POA: Diagnosis not present

## 2018-06-17 DIAGNOSIS — N950 Postmenopausal bleeding: Secondary | ICD-10-CM | POA: Diagnosis not present

## 2018-06-17 DIAGNOSIS — D250 Submucous leiomyoma of uterus: Secondary | ICD-10-CM | POA: Diagnosis not present

## 2018-06-17 DIAGNOSIS — K660 Peritoneal adhesions (postprocedural) (postinfection): Secondary | ICD-10-CM | POA: Diagnosis not present

## 2018-06-17 DIAGNOSIS — Z8049 Family history of malignant neoplasm of other genital organs: Secondary | ICD-10-CM | POA: Diagnosis not present

## 2018-06-17 DIAGNOSIS — Z96653 Presence of artificial knee joint, bilateral: Secondary | ICD-10-CM | POA: Diagnosis not present

## 2018-06-17 DIAGNOSIS — F17210 Nicotine dependence, cigarettes, uncomplicated: Secondary | ICD-10-CM | POA: Diagnosis not present

## 2018-06-17 DIAGNOSIS — R102 Pelvic and perineal pain: Secondary | ICD-10-CM | POA: Diagnosis not present

## 2018-06-17 DIAGNOSIS — J449 Chronic obstructive pulmonary disease, unspecified: Secondary | ICD-10-CM | POA: Diagnosis not present

## 2018-06-17 DIAGNOSIS — Z96642 Presence of left artificial hip joint: Secondary | ICD-10-CM | POA: Diagnosis not present

## 2018-06-17 DIAGNOSIS — N888 Other specified noninflammatory disorders of cervix uteri: Secondary | ICD-10-CM | POA: Diagnosis not present

## 2018-06-17 LAB — CBC
HCT: 40.7 % (ref 36.0–46.0)
Hemoglobin: 13.3 g/dL (ref 12.0–15.0)
MCH: 33.4 pg (ref 26.0–34.0)
MCHC: 32.7 g/dL (ref 30.0–36.0)
MCV: 102.3 fL — ABNORMAL HIGH (ref 80.0–100.0)
Platelets: 262 10*3/uL (ref 150–400)
RBC: 3.98 MIL/uL (ref 3.87–5.11)
RDW: 13.2 % (ref 11.5–15.5)
WBC: 20.2 10*3/uL — ABNORMAL HIGH (ref 4.0–10.5)
nRBC: 0 % (ref 0.0–0.2)

## 2018-06-17 MED ORDER — OXYCODONE HCL 5 MG PO TABS
ORAL_TABLET | ORAL | Status: AC
  Filled 2018-06-17: qty 2

## 2018-06-17 MED ORDER — KETOROLAC TROMETHAMINE 30 MG/ML IJ SOLN
INTRAMUSCULAR | Status: AC
  Filled 2018-06-17: qty 1

## 2018-06-17 MED ORDER — HYDROMORPHONE HCL 2 MG PO TABS
2.0000 mg | ORAL_TABLET | Freq: Once | ORAL | Status: DC
  Filled 2018-06-17: qty 1

## 2018-06-17 MED ORDER — HYDROMORPHONE HCL 2 MG PO TABS: 2.0000 mg | ORAL_TABLET | ORAL | 0 refills | Status: DC | PRN

## 2018-06-17 MED ORDER — HYDROMORPHONE HCL 2 MG PO TABS
2.0000 mg | ORAL_TABLET | Freq: Once | ORAL | Status: AC
  Administered 2018-06-17: 2 mg via ORAL
  Filled 2018-06-17: qty 1

## 2018-06-17 MED ORDER — ACETAMINOPHEN 325 MG PO TABS: 650.0000 mg | ORAL_TABLET | ORAL | Status: DC | PRN

## 2018-06-17 MED ORDER — HYDROMORPHONE HCL 2 MG PO TABS
ORAL_TABLET | ORAL | Status: AC
  Filled 2018-06-17: qty 1

## 2018-06-17 MED ORDER — IBUPROFEN 800 MG PO TABS: 800.0000 mg | ORAL_TABLET | Freq: Three times a day (TID) | ORAL | 0 refills | Status: DC | PRN

## 2018-06-17 NOTE — Discharge Instructions (Signed)
DISCHARGE INSTRUCTIONS: Robotic assisted Hysterectomy with removal of right ovary and fallopian tube  The following instructions have been prepared to help you care for yourself upon your return home today.  Wound care:  Do not get the incision wet for the first 24 hours. The incision should be kept clean and dry.  Your surgical incisions are covered with surgical glue.  It will come off on its own in about a week-DO NOT PEEL OFF.  Should the incision become sore, red, and swollen after the first week, check with your doctor.  Personal hygiene:  Shower the day after your procedure.  Activity and limitations:  Do NOT drive or operate any equipment today.  Do NOT lift anything more than 15 pounds for 2-3 weeks after surgery.  Do NOT rest in bed all day.  Walking is encouraged. Walk each day, starting slowly with 5-minute walks 3 or 4 times a day. Slowly increase the length of your walks.  Walk up and down stairs slowly.  Do NOT do strenuous activities, such as golfing, playing tennis, bowling, running, biking, weight lifting, gardening, mowing, or vacuuming for 2-4 weeks. Ask your doctor when it is okay to start.  Diet: Eat a light meal as desired this evening. You may resume your usual diet tomorrow.  Return to work: This is dependent on the type of work you do. For the most part you can return to a desk job within a week of surgery. If you are more active at work, please discuss this with your doctor.  What to expect after your surgery: You may have a slight burning sensation when you urinate on the first day. You may have a very small amount of blood in the urine. Expect to have a small amount of vaginal discharge/light bleeding for 1-2 weeks. It is not unusual to have abdominal soreness and bruising for up to 2 weeks. You may be tired and need more rest for about 1 week. You may experience shoulder pain for 24-72 hours. Lying flat in bed may relieve it.  Call your doctor for any  of the following:  Develop a fever of 100.4 or greater  Inability to urinate 6 hours after discharge from hospital  Severe pain not relieved by pain medications  Persistent of heavy bleeding at incision site  Redness or swelling around incision site after a week  Increasing nausea or vomiting   Post Anesthesia Home Care Instructions  Activity: Get plenty of rest for the remainder of the day. A responsible individual must stay with you for 24 hours following the procedure.  For the next 24 hours, DO NOT: -Drive a car -Paediatric nurse -Drink alcoholic beverages -Take any medication unless instructed by your physician -Make any legal decisions or sign important papers.  Meals: Start with liquid foods such as gelatin or soup. Progress to regular foods as tolerated. Avoid greasy, spicy, heavy foods. If nausea and/or vomiting occur, drink only clear liquids until the nausea and/or vomiting subsides. Call your physician if vomiting continues.  Special Instructions/Symptoms: Your throat may feel dry or sore from the anesthesia or the breathing tube placed in your throat during surgery. If this causes discomfort, gargle with warm salt water. The discomfort should disappear within 24 hours.

## 2018-06-17 NOTE — Discharge Summary (Signed)
Physician Discharge Summary  Patient ID: Katrina [name] MRN: [id number] DOB/AGE: [birth year] 63 y.o.  Admit date: 06/16/2018 Discharge date: 06/17/2018  Admission Diagnoses:Postmenopausal bleeding ... Pelvic pain ...   Discharge Diagnoses:  Active Problems:   Postmenopausal bleeding   S/P hysterectomy Pelvic adhesive disease   Discharged Condition: stable  Hospital Course: pt was admitted for observation after undergoing a robotic assisted laparoscopic hysterectomy with right salpingoophrorectomy. She did well postoperatively with return of bowel and bladder function.   Consults: cardiology  Significant Diagnostic Studies: labs: hgb pod #1 13.3  Treatments: surgery:  robotic assisted laparoscopic hysterectomy with right salpingoophrorectomy.  Discharge Exam: Blood pressure (!) 159/88, pulse 74, temperature 98.1 F (36.7 C), resp. rate 18, height 5\' 7"  (1.702 m), weight 100.9 kg, SpO2 99 %. General appearance: alert, cooperative and no distress GI: soft appropriately tender nondistended +BS  Extremities: extremities normal, atraumatic, no cyanosis or edema Incision/Wound:well approximated no erythema or exudate   Disposition: Discharge disposition: 01-Home or Self Care       Discharge Instructions    Call MD for:  persistant nausea and vomiting   Complete by:  As directed    Call MD for:  redness, tenderness, or signs of infection (pain, swelling, redness, odor or green/yellow discharge around incision site)   Complete by:  As directed    Call MD for:  severe uncontrolled pain   Complete by:  As directed    Call MD for:  temperature >100.4   Complete by:  As directed    Diet - low sodium heart healthy   Complete by:  As directed    Driving Restrictions   Complete by:  As directed    Avoid driving for 1 week   Increase activity slowly   Complete by:  As directed    Lifting restrictions   Complete by:  As directed    Avoid lifting over 10 lbs   No wound care    Complete by:  As directed    Sexual Activity Restrictions   Complete by:  As directed    Avoid sex for 6 weeks     Allergies as of 06/17/2018      Reactions   Codeine Itching, Nausea Only      Medication List    TAKE these medications   acetaminophen 325 MG tablet Commonly known as:  TYLENOL Take 2 tablets (650 mg total) by mouth every 4 (four) hours as needed for mild pain (temperature > 101.5.). What changed:    medication strength  how much to take  when to take this  reasons to take this   HYDROmorphone 2 MG tablet Commonly known as:  DILAUDID Take 1 tablet (2 mg total) by mouth every 4 (four) hours as needed for severe pain.   ibuprofen 800 MG tablet Commonly known as:  ADVIL,MOTRIN Take 1 tablet (800 mg total) by mouth every 8 (eight) hours as needed for mild pain or moderate pain. What changed:  reasons to take this      Follow-up Information    Christophe Louis, MD. Go in 2 week(s).   Specialty:  Obstetrics and Gynecology Contact information: [address] [place] [address] [phone]           Signed: Christophe Louis 06/17/2018, 10:18 AM

## 2018-06-18 ENCOUNTER — Encounter (HOSPITAL_BASED_OUTPATIENT_CLINIC_OR_DEPARTMENT_OTHER): Payer: Self-pay | Admitting: Obstetrics and Gynecology

## 2018-07-26 DIAGNOSIS — Z90710 Acquired absence of both cervix and uterus: Secondary | ICD-10-CM | POA: Diagnosis not present

## 2018-07-26 DIAGNOSIS — N898 Other specified noninflammatory disorders of vagina: Secondary | ICD-10-CM | POA: Diagnosis not present

## 2018-07-26 DIAGNOSIS — N952 Postmenopausal atrophic vaginitis: Secondary | ICD-10-CM | POA: Diagnosis not present

## 2018-09-30 DIAGNOSIS — F5101 Primary insomnia: Secondary | ICD-10-CM | POA: Diagnosis not present

## 2018-09-30 DIAGNOSIS — N952 Postmenopausal atrophic vaginitis: Secondary | ICD-10-CM | POA: Diagnosis not present

## 2018-09-30 DIAGNOSIS — N762 Acute vulvitis: Secondary | ICD-10-CM | POA: Diagnosis not present

## 2018-09-30 DIAGNOSIS — F418 Other specified anxiety disorders: Secondary | ICD-10-CM | POA: Diagnosis not present

## 2018-11-03 ENCOUNTER — Ambulatory Visit: Payer: Self-pay

## 2018-11-03 ENCOUNTER — Other Ambulatory Visit: Payer: Self-pay

## 2018-11-03 ENCOUNTER — Ambulatory Visit (INDEPENDENT_AMBULATORY_CARE_PROVIDER_SITE_OTHER): Payer: Medicare Other | Admitting: Physician Assistant

## 2018-11-03 ENCOUNTER — Encounter: Payer: Self-pay | Admitting: Physician Assistant

## 2018-11-03 VITALS — Ht 67.0 in | Wt 217.0 lb

## 2018-11-03 DIAGNOSIS — M25561 Pain in right knee: Secondary | ICD-10-CM | POA: Diagnosis not present

## 2018-11-03 DIAGNOSIS — M25551 Pain in right hip: Secondary | ICD-10-CM

## 2018-11-03 MED ORDER — METHYLPREDNISOLONE ACETATE 40 MG/ML IJ SUSP
40.0000 mg | INTRAMUSCULAR | Status: AC | PRN
  Administered 2018-11-03: 40 mg via INTRA_ARTICULAR

## 2018-11-03 MED ORDER — LIDOCAINE HCL 1 % IJ SOLN
3.0000 mL | INTRAMUSCULAR | Status: AC | PRN
  Administered 2018-11-03: 18:00:00 3 mL

## 2018-11-03 NOTE — Progress Notes (Signed)
Office Visit Note   Patient: Katrina [name]           Date of Birth: [birth year]           MRN: [id number] Visit Date: 11/03/2018              Requested by: Shirline Frees, MD [address] [place],  [place] [address] PCP: Shirline Frees, MD   Assessment & Plan: Visit Diagnoses:  1. Acute pain of right knee   2. Pain in right hip     Plan:  Explained to her that it may take up to 3 months for the contusion to the need to resolve.  In regards to her right hip I like for her to do some IT band stretching.  Patient shown IT band stretching exercises today.  If she continues to have pain in the right hip despite the trochanteric injection she was given today she can call the office and we will set her up for an intra-articular injection.  Questions encouraged and answered.  Follow-Up Instructions: Return if symptoms worsen or fail to improve.   Orders:  Orders Placed This Encounter  Procedures  . Large Joint Inj  . XR HIP UNILAT W OR W/O PELVIS 2-3 VIEWS RIGHT  . XR Knee 1-2 Views Right   No orders of the defined types were placed in this encounter.     Procedures: Large Joint Inj: R greater trochanter on 11/03/2018 5:36 PM Indications: pain Details: 22 G 1.5 in needle, lateral approach  Arthrogram: No  Medications: 3 mL lidocaine 1 %; 40 mg methylPREDNISolone acetate 40 MG/ML Outcome: tolerated well, no immediate complications Procedure, treatment alternatives, risks and benefits explained, specific risks discussed. Consent was given by the patient. Immediately prior to procedure a time out was called to verify the correct patient, procedure, equipment, support staff and site/side marked as required. Patient was prepped and draped in the usual sterile fashion.       Clinical Data: No additional findings.   Subjective: Chief Complaint  Patient presents with  . Right Knee - Pain  . Right Hip - Pain    HPI Mrs. Hemberger 63 year old female  well-known to Dr. Ninfa Linden service.  She comes in today for right hip pain and right knee pain status post fall she states she fell over a month ago due to some slippers on her floor.  Landing on her right knee.  She just feels like something is wrong with the knee she is also had increased pain lateral aspect of her right hip.  She is also having a electrical shooting shocking sensation in the right hip.  She has known osteoarthritis of the hip with its mild to moderate and an anterior labral tear.  She is had intra-articular injection in the past that has helped with pain.  She is also had trochanteric injections that have helped in the past.  She denies any loss consciousness dizziness or other injury at the time of the fall.  Review of Systems Please see HPI otherwise negative  Objective: Vital Signs: Ht 5\' 7"  (1.702 m)   Wt 217 lb (98.4 kg)   BMI 33.99 kg/m   Physical Exam Constitutional:      Appearance: She is not ill-appearing or diaphoretic.  Pulmonary:     Effort: Pulmonary effort is normal.  Neurological:     Mental Status: She is alert and oriented to person, place, and time.     Ortho Exam Bilateral knees  good range of motion.  Able do straight leg raise bilaterally.  No instability valgus varus stressing.  Anterior drawer reveals slight translation bilaterally.  No abnormal warmth erythema of either knee.  Well-healed surgical incisions over both knees. Bilateral hips good range of motion without pain except for external rotation of the right hip causes discomfort.  She has tenderness over the right trochanteric region. Specialty Comments:  No specialty comments available.  Imaging: Xr Hip Unilat W Or W/o Pelvis 2-3 Views Right  Result Date: 11/03/2018 AP pelvis lateral view of the right hip: Bilateral hips well located status post left total hip arthroplasty appears well-seated.  Right hip with moderate narrowing of the joint space.  No acute fracture.  Right hip is well  located.  Xr Knee 1-2 Views Right  Result Date: 11/03/2018 Right knee 2 views: No acute fracture.  Well-seated total knee arthroplasty components.  No bony abnormalities.    PMFS History: Patient Active Problem List   Diagnosis Date Noted  . Postmenopausal bleeding 06/16/2018  . S/P hysterectomy 06/16/2018  . Chronic right shoulder pain 10/28/2016  . Impingement syndrome of right shoulder 10/28/2016  . Osteoarthritis of left knee 12/15/2014  . Status post total left knee replacement 12/15/2014  . Hand pain, left 04/21/2014  . Cat bite of finger 04/21/2014  . Asthma 04/21/2014  . Tobacco abuse 04/21/2014  . COPD (chronic obstructive pulmonary disease) ([place])   . Effusion of left knee joint 11/03/2013  . Arthritis of right knee 12/03/2012  . Degenerative arthritis of hip 05/02/2011   Past Medical History:  Diagnosis Date  . Anxiety   . Arthritis    knees, hips, elbows   . Asthma    hx of asthma 3/12- hospitalized   . COPD (chronic obstructive pulmonary disease) ([facility])   . Depression   . GERD (gastroesophageal reflux disease)    PRIOR TO HAVING GALLBLADDER REMOVED  . Headache   . Lung infection 2015  . Lung infection   . volvulus     previous history of twisted intestines    History reviewed. No pertinent family history.  Past Surgical History:  Procedure Laterality Date  . BACK SURGERY     x2  . CARPAL TUNNEL RELEASE     left   . CESAREAN SECTION     x 2  . CHOLECYSTECTOMY    . COLONOSCOPY  10/2017  . DILATATION & CURETTAGE/HYSTEROSCOPY WITH MYOSURE N/A 12/10/2017   Procedure: DILATATION & CURETTAGE/HYSTEROSCOPY WITH MYOSURE;  Surgeon: Christophe Louis, MD;  Location: [facility];  Service: Gynecology;  Laterality: N/A;  POSSIBLE MYOMECTOMY VS POLYPECTOMY WITH MYOSURE  . DILATION AND CURETTAGE OF UTERUS    . JOINT REPLACEMENT    . KNEE ARTHROSCOPY     bilateral x 2   . KNEE ARTHROSCOPY Left 11/03/2013   Procedure: LEFT KNEE ARTHROSCOPY WITH DEBRIDEMENT, PARTIAL SYNOVECTOMY;   Surgeon: Mcarthur Rossetti, MD;  Location: [facility];  Service: Orthopedics;  Laterality: Left;  . LAPAROSCOPY    . left elbow surgery     . OTHER SURGICAL HISTORY     arthroswcopic surgery right knee   . OTHER SURGICAL HISTORY     arthroscopic surgery left knee x 2   . OTHER SURGICAL HISTORY     bunionectomy right foot   . OTHER SURGICAL HISTORY     C Section x 2   . OTHER SURGICAL HISTORY     carpal tunnel on left   . right foot bunionectomy     .  ROBOTIC ASSISTED TOTAL HYSTERECTOMY WITH BILATERAL SALPINGO OOPHERECTOMY Bilateral 06/16/2018   Procedure: XI ROBOTIC ASSISTED TOTAL HYSTERECTOMY WITH RIGHT SALPINGO OOPHORECTOMY;  Surgeon: Christophe Louis, MD;  Location: [facility];  Service: Gynecology;  Laterality: Bilateral;  . TOTAL HIP ARTHROPLASTY  05/02/2011   Procedure: TOTAL HIP ARTHROPLASTY ANTERIOR APPROACH;  Surgeon: Mcarthur Rossetti, MD;  Location: [facility];  Service: Orthopedics;  Laterality: Left;  Left Total Hip Arthroplasty, Direct Anterior Approach  . TOTAL KNEE ARTHROPLASTY Right 12/03/2012   Procedure: RIGHT TOTAL KNEE ARTHROPLASTY;  Surgeon: Mcarthur Rossetti, MD;  Location: [facility];  Service: Orthopedics;  Laterality: Right;  . TOTAL KNEE ARTHROPLASTY Left 12/15/2014   Procedure: LEFT TOTAL KNEE ARTHROPLASTY;  Surgeon: Mcarthur Rossetti, MD;  Location: [facility];  Service: Orthopedics;  Laterality: Left;  . TUBAL LIGATION    . ULNAR NERVE TRANSPOSITION     left   . UPPER GI ENDOSCOPY     x 2   Social History   Occupational History  . Not on file  Tobacco Use  . Smoking status: Current Every Day Smoker    Packs/day: 0.25    Years: 25.00    Pack years: 6.25    Types: Cigarettes  . Smokeless tobacco: Never Used  . Tobacco comment: Pt reports that she is in the process of quitting  Substance and Sexual Activity  . Alcohol use: Yes    Comment: occasional beer   . Drug use: No    Comment: hx of 34 years ago marijuana   . Sexual activity: Not  on file

## 2019-02-21 ENCOUNTER — Other Ambulatory Visit: Payer: Self-pay

## 2019-02-21 ENCOUNTER — Encounter: Payer: Self-pay | Admitting: Physician Assistant

## 2019-02-21 ENCOUNTER — Ambulatory Visit: Payer: Self-pay

## 2019-02-21 ENCOUNTER — Ambulatory Visit: Payer: Medicare Other | Admitting: Physician Assistant

## 2019-02-21 DIAGNOSIS — M25511 Pain in right shoulder: Secondary | ICD-10-CM | POA: Diagnosis not present

## 2019-02-21 MED ORDER — CYCLOBENZAPRINE HCL 10 MG PO TABS: 10.0000 mg | ORAL_TABLET | Freq: Every day | ORAL | 0 refills | Status: DC

## 2019-02-21 MED ORDER — METHYLPREDNISOLONE ACETATE 40 MG/ML IJ SUSP
40.0000 mg | INTRAMUSCULAR | Status: AC | PRN
  Administered 2019-02-21: 40 mg via INTRA_ARTICULAR

## 2019-02-21 MED ORDER — LIDOCAINE HCL 1 % IJ SOLN
3.0000 mL | INTRAMUSCULAR | Status: AC | PRN
  Administered 2019-02-21: 10:00:00 3 mL

## 2019-02-21 NOTE — Progress Notes (Addendum)
Office Visit Note   Patient: Katrina [name]           Date of Birth: [birth year]           MRN: [id number] Visit Date: 02/21/2019              Requested by: Shirline Frees, MD [address] [place],  [place] [address] PCP: Shirline Frees, MD   Assessment & Plan: Visit Diagnoses:  1. Right shoulder pain, unspecified chronicity     Plan: She shown neck exercises and shoulder exercises which she can perform on her own.  We will see her back in 2 weeks to see what type of response she had to the injection.  She may need further work-up of the shoulder and/or the neck especially of her radicular symptoms down the right arm continued.  Questions were encouraged and answered.  Follow-Up Instructions: Return in about 2 weeks (around 03/07/2019).   Orders:  Orders Placed This Encounter  Procedures   Large Joint Inj: R subacromial bursa   XR Shoulder Right   Meds ordered this encounter  Medications   cyclobenzaprine (FLEXERIL) 10 MG tablet    Sig: Take 1 tablet (10 mg total) by mouth at bedtime.    Dispense:  30 tablet    Refill:  0      Procedures: Large Joint Inj: R subacromial bursa on 02/21/2019 10:19 AM Indications: pain Details: 22 G 1.5 in needle, superior approach  Arthrogram: No  Medications: 3 mL lidocaine 1 %; 40 mg methylPREDNISolone acetate 40 MG/ML Outcome: tolerated well, no immediate complications Procedure, treatment alternatives, risks and benefits explained, specific risks discussed. Consent was given by the patient. Immediately prior to procedure a time out was called to verify the correct patient, procedure, equipment, support staff and site/side marked as required. Patient was prepped and draped in the usual sterile fashion.       Clinical Data: No additional findings.   Subjective: Chief Complaint  Patient presents with   Right Shoulder - Pain    HPI [name] Is well-known.by my service comes in today with right  shoulder pain for the last 3 weeks no known injury.  She has a history of right shoulder arthroscopy with arthroscopic debridement and acromioplasty without coracoacromial release 2018.  She is done well until recently.  She states she has pain with any movement of the shoulder.  She is also having some right scapular pain.  She also notes radiating pain down the arm some numbness into her hand move involves the median nerve and ulnar nerve distribution of the hand.  She has some soreness in her neck.  Cervical spine films back in 2018 showed no significant malalignment minimal degenerative changes in the mid cervical spine.  Review of Systems Please see HPI otherwise negative or noncontributory.  Denies any fevers chills shortness of breath chest pain  Objective: Vital Signs: There were no vitals taken for this visit.  Physical Exam Constitutional:      Appearance: She is not ill-appearing or diaphoretic.  Pulmonary:     Effort: Pulmonary effort is normal.  Neurological:     Mental Status: She is alert and oriented to person, place, and time.     Ortho Exam Cervical spine good range of motion cervical spine without pain.  She has tenderness in the trapezius region of the right shoulder and along medial border of the scapula.  Diminished range of motion of the right shoulder difficulty examining secondary  to significant pain with any range of motion of the shoulder today.  5 out of 5 strength today with external and internal rotation.  Actively she can forward flex the shoulder to approximately 90 degrees and bring her right shoulder to approximately 120 degrees but after this is too painful for her to move the shoulder.  Postinjection she is able to bring the arm actively to 180 degrees with decreased pain.  Specialty Comments:  No specialty comments available.  Imaging: Xr Shoulder Right  Result Date: 02/21/2019 Right shoulder 3 views: No acute fracture.  Shoulders well located.   Glenohumeral joint appears well maintained.    PMFS History: Patient Active Problem List   Diagnosis Date Noted   Postmenopausal bleeding 06/16/2018   S/P hysterectomy 06/16/2018   Chronic right shoulder pain 10/28/2016   Impingement syndrome of right shoulder 10/28/2016   Osteoarthritis of left knee 12/15/2014   Status post total left knee replacement 12/15/2014   Hand pain, left 04/21/2014   Cat bite of finger 04/21/2014   Asthma 04/21/2014   Tobacco abuse 04/21/2014   COPD (chronic obstructive pulmonary disease) ([place])    Effusion of left knee joint 11/03/2013   Arthritis of right knee 12/03/2012   Degenerative arthritis of hip 05/02/2011   Past Medical History:  Diagnosis Date   Anxiety    Arthritis    knees, hips, elbows    Asthma    hx of asthma 3/12- hospitalized    COPD (chronic obstructive pulmonary disease) ([facility])    Depression    GERD (gastroesophageal reflux disease)    PRIOR TO HAVING GALLBLADDER REMOVED   Headache    Lung infection 2015   Lung infection    volvulus     previous history of twisted intestines    History reviewed. No pertinent family history.  Past Surgical History:  Procedure Laterality Date   BACK SURGERY     x2   CARPAL TUNNEL RELEASE     left    CESAREAN SECTION     x 2   CHOLECYSTECTOMY     COLONOSCOPY  10/2017   DILATATION & CURETTAGE/HYSTEROSCOPY WITH MYOSURE N/A 12/10/2017   Procedure: DILATATION & CURETTAGE/HYSTEROSCOPY WITH MYOSURE;  Surgeon: Christophe Louis, MD;  Location: [facility];  Service: Gynecology;  Laterality: N/A;  POSSIBLE MYOMECTOMY VS POLYPECTOMY WITH MYOSURE   DILATION AND CURETTAGE OF UTERUS     JOINT REPLACEMENT     KNEE ARTHROSCOPY     bilateral x 2    KNEE ARTHROSCOPY Left 11/03/2013   Procedure: LEFT KNEE ARTHROSCOPY WITH DEBRIDEMENT, PARTIAL SYNOVECTOMY;  Surgeon: Mcarthur Rossetti, MD;  Location: [facility];  Service: Orthopedics;  Laterality: Left;   LAPAROSCOPY     left  elbow surgery      OTHER SURGICAL HISTORY     arthroswcopic surgery right knee    OTHER SURGICAL HISTORY     arthroscopic surgery left knee x 2    OTHER SURGICAL HISTORY     bunionectomy right foot    OTHER SURGICAL HISTORY     C Section x 2    OTHER SURGICAL HISTORY     carpal tunnel on left    right foot bunionectomy      ROBOTIC ASSISTED TOTAL HYSTERECTOMY WITH BILATERAL SALPINGO OOPHERECTOMY Bilateral 06/16/2018   Procedure: XI ROBOTIC ASSISTED TOTAL HYSTERECTOMY WITH RIGHT SALPINGO OOPHORECTOMY;  Surgeon: Christophe Louis, MD;  Location: [facility];  Service: Gynecology;  Laterality: Bilateral;   TOTAL HIP ARTHROPLASTY  05/02/2011  Procedure: TOTAL HIP ARTHROPLASTY ANTERIOR APPROACH;  Surgeon: Mcarthur Rossetti, MD;  Location: [facility];  Service: Orthopedics;  Laterality: Left;  Left Total Hip Arthroplasty, Direct Anterior Approach   TOTAL KNEE ARTHROPLASTY Right 12/03/2012   Procedure: RIGHT TOTAL KNEE ARTHROPLASTY;  Surgeon: Mcarthur Rossetti, MD;  Location: [facility];  Service: Orthopedics;  Laterality: Right;   TOTAL KNEE ARTHROPLASTY Left 12/15/2014   Procedure: LEFT TOTAL KNEE ARTHROPLASTY;  Surgeon: Mcarthur Rossetti, MD;  Location: [facility];  Service: Orthopedics;  Laterality: Left;   TUBAL LIGATION     ULNAR NERVE TRANSPOSITION     left    UPPER GI ENDOSCOPY     x 2   Social History   Occupational History   Not on file  Tobacco Use   Smoking status: Current Every Day Smoker    Packs/day: 0.25    Years: 25.00    Pack years: 6.25    Types: Cigarettes   Smokeless tobacco: Never Used   Tobacco comment: Pt reports that she is in the process of quitting  Substance and Sexual Activity   Alcohol use: Yes    Comment: occasional beer    Drug use: No    Comment: hx of 34 years ago marijuana    Sexual activity: Not on file

## 2019-03-07 ENCOUNTER — Encounter: Payer: Self-pay | Admitting: Physician Assistant

## 2019-03-07 ENCOUNTER — Other Ambulatory Visit: Payer: Self-pay

## 2019-03-07 ENCOUNTER — Telehealth: Payer: Self-pay | Admitting: Radiology

## 2019-03-07 ENCOUNTER — Other Ambulatory Visit: Payer: Self-pay | Admitting: Radiology

## 2019-03-07 ENCOUNTER — Ambulatory Visit: Payer: Medicare Other | Admitting: Physician Assistant

## 2019-03-07 ENCOUNTER — Ambulatory Visit: Payer: Self-pay

## 2019-03-07 DIAGNOSIS — M542 Cervicalgia: Secondary | ICD-10-CM

## 2019-03-07 DIAGNOSIS — M25511 Pain in right shoulder: Secondary | ICD-10-CM

## 2019-03-07 MED ORDER — TRAMADOL HCL 50 MG PO TABS: 50.0000 mg | ORAL_TABLET | Freq: Four times a day (QID) | ORAL | 0 refills | Status: DC | PRN

## 2019-03-07 NOTE — Progress Notes (Signed)
Office Visit Note   Patient: Katrina [name]           Date of Birth: 09-Jun-1955           MRN: [id number] Visit Date: 03/07/2019              Requested by: Shirline Frees, MD [address] [place],  [place] [address] PCP: Shirline Frees, MD   Assessment & Plan: Visit Diagnoses:  1. Neck pain     Plan:  Given the patient's mixed exam at this point time would like to obtain EMG nerve conduction studies today rule out carpal tunnel syndrome versus cervical radiculopathy as source of her pain.  Have her follow-up after the EMG nerve conduction studies to go over the results and discuss further treatment.  She will continue to do the home exercises as shown for her neck and shoulder.  Questions encouraged and answered.  Follow-Up Instructions: Return After EMG/ NCS.   Orders:  Orders Placed This Encounter  Procedures   XR Cervical Spine 2 or 3 views   Meds ordered this encounter  Medications   traMADol (ULTRAM) 50 MG tablet    Sig: Take 1 tablet (50 mg total) by mouth every 6 (six) hours as needed.    Dispense:  30 tablet    Refill:  0      Procedures: No procedures performed   Clinical Data: No additional findings.   Subjective: Chief Complaint  Patient presents with   Right Shoulder - Follow-up    HPI Katrina [name] returns today follow-up of her right shoulder pain.  2 weeks ago she received a subacromial injection right shoulder she states she is 50% better regards the shoulder is not as tender.  Still having pain and decreased range of motion.  However she is now having pain whenever she turns her head to the left feels a pulling sensation.  She is having numbness involving the thumb and index finger on the right.  Pain that radiates down the right arm is present.  She has been doing the neck exercises and shoulder exercises as shown.  She is also taking muscle relaxer at night.  She is taking Tylenol for pain.  Waking up in the middle night due to  her neck pain and right arm pain.  Patient has previous history of left carpal tunnel release. Review of Systems See HPI  Objective: Vital Signs: There were no vitals taken for this visit.  Physical Exam Constitutional:      Appearance: She is not ill-appearing or diaphoretic.  Pulmonary:     Effort: Pulmonary effort is normal.  Neurological:     Mental Status: She is alert and oriented to person, place, and time.  Psychiatric:        Mood and Affect: Mood normal.     Ortho Exam Cervical spine tenderness over the cervical spinal column.  Tenderness over the right scapular border.  Spurling's test is positive.  Bilateral shoulders 5 5 strength external and internal rotation against resistance.  Right shoulder limited forward flexion abduction to 90 degrees.  Impingement testing positive on the right.  Subjective decreased sensation over the ulnar distribution of the left hand and over the median nerve distribution of the right hand.  Positive Tinel's at the right wrist positive compression test of the median nerve at the right wrist.Full motor bilateral hands.   Specialty Comments:  No specialty comments available.  Imaging: Xr Cervical Spine 2 Or 3 Views  Result Date: 03/07/2019 Cervical spine 2 views: Loss of lordotic curvature.  Degenerative changes mid cervical spine.  No spondylolisthesis.  No acute fractures.    PMFS History: Patient Active Problem List   Diagnosis Date Noted   Postmenopausal bleeding 06/16/2018   S/P hysterectomy 06/16/2018   Chronic right shoulder pain 10/28/2016   Impingement syndrome of right shoulder 10/28/2016   Osteoarthritis of left knee 12/15/2014   Status post total left knee replacement 12/15/2014   Hand pain, left 04/21/2014   Cat bite of finger 04/21/2014   Asthma 04/21/2014   Tobacco abuse 04/21/2014   COPD (chronic obstructive pulmonary disease) ([place])    Effusion of left knee joint 11/03/2013   Arthritis of right knee  12/03/2012   Degenerative arthritis of hip 05/02/2011   Past Medical History:  Diagnosis Date   Anxiety    Arthritis    knees, hips, elbows    Asthma    hx of asthma 3/12- hospitalized    COPD (chronic obstructive pulmonary disease) ([facility])    Depression    GERD (gastroesophageal reflux disease)    PRIOR TO HAVING GALLBLADDER REMOVED   Headache    Lung infection 2015   Lung infection    volvulus     previous history of twisted intestines    History reviewed. No pertinent family history.  Past Surgical History:  Procedure Laterality Date   BACK SURGERY     x2   CARPAL TUNNEL RELEASE     left    CESAREAN SECTION     x 2   CHOLECYSTECTOMY     COLONOSCOPY  10/2017   DILATATION & CURETTAGE/HYSTEROSCOPY WITH MYOSURE N/A 12/10/2017   Procedure: DILATATION & CURETTAGE/HYSTEROSCOPY WITH MYOSURE;  Surgeon: Christophe Louis, MD;  Location: [facility];  Service: Gynecology;  Laterality: N/A;  POSSIBLE MYOMECTOMY VS POLYPECTOMY WITH MYOSURE   DILATION AND CURETTAGE OF UTERUS     JOINT REPLACEMENT     KNEE ARTHROSCOPY     bilateral x 2    KNEE ARTHROSCOPY Left 11/03/2013   Procedure: LEFT KNEE ARTHROSCOPY WITH DEBRIDEMENT, PARTIAL SYNOVECTOMY;  Surgeon: Mcarthur Rossetti, MD;  Location: [facility];  Service: Orthopedics;  Laterality: Left;   LAPAROSCOPY     left elbow surgery      OTHER SURGICAL HISTORY     arthroswcopic surgery right knee    OTHER SURGICAL HISTORY     arthroscopic surgery left knee x 2    OTHER SURGICAL HISTORY     bunionectomy right foot    OTHER SURGICAL HISTORY     C Section x 2    OTHER SURGICAL HISTORY     carpal tunnel on left    right foot bunionectomy      ROBOTIC ASSISTED TOTAL HYSTERECTOMY WITH BILATERAL SALPINGO OOPHERECTOMY Bilateral 06/16/2018   Procedure: XI ROBOTIC ASSISTED TOTAL HYSTERECTOMY WITH RIGHT SALPINGO OOPHORECTOMY;  Surgeon: Christophe Louis, MD;  Location: [facility];  Service: Gynecology;  Laterality:  Bilateral;   TOTAL HIP ARTHROPLASTY  05/02/2011   Procedure: TOTAL HIP ARTHROPLASTY ANTERIOR APPROACH;  Surgeon: Mcarthur Rossetti, MD;  Location: [facility];  Service: Orthopedics;  Laterality: Left;  Left Total Hip Arthroplasty, Direct Anterior Approach   TOTAL KNEE ARTHROPLASTY Right 12/03/2012   Procedure: RIGHT TOTAL KNEE ARTHROPLASTY;  Surgeon: Mcarthur Rossetti, MD;  Location: [facility];  Service: Orthopedics;  Laterality: Right;   TOTAL KNEE ARTHROPLASTY Left 12/15/2014   Procedure: LEFT TOTAL KNEE ARTHROPLASTY;  Surgeon: Mcarthur Rossetti, MD;  Location:  [facility];  Service: Orthopedics;  Laterality: Left;   TUBAL LIGATION     ULNAR NERVE TRANSPOSITION     left    UPPER GI ENDOSCOPY     x 2   Social History   Occupational History   Not on file  Tobacco Use   Smoking status: Current Every Day Smoker    Packs/day: 0.25    Years: 25.00    Pack years: 6.25    Types: Cigarettes   Smokeless tobacco: Never Used   Tobacco comment: Pt reports that she is in the process of quitting  Substance and Sexual Activity   Alcohol use: Yes    Comment: occasional beer    Drug use: No    Comment: hx of 34 years ago marijuana    Sexual activity: Not on file

## 2019-03-07 NOTE — Telephone Encounter (Signed)
Request for diagnosis code from [place] on tramadol Rx. Pharmacy is cutting back Rx to 5 day supply for acute pain. Relayed information back [place]. If patient needs anything different she can call office.

## 2019-03-08 ENCOUNTER — Telehealth: Payer: Self-pay | Admitting: Orthopaedic Surgery

## 2019-03-08 NOTE — Telephone Encounter (Signed)
Katie daughter to the pt called in said they havent heard anything from [place] in regards to making her an appt for a nerve study. Pt states she got home today and noticed her mothers collar bone is extremely visually swollen. Please give her a call    [phone]

## 2019-03-09 ENCOUNTER — Other Ambulatory Visit: Payer: Self-pay | Admitting: Family Medicine

## 2019-03-09 DIAGNOSIS — Z1231 Encounter for screening mammogram for malignant neoplasm of breast: Secondary | ICD-10-CM

## 2019-03-17 ENCOUNTER — Telehealth: Payer: Self-pay | Admitting: Physical Medicine and Rehabilitation

## 2019-03-18 ENCOUNTER — Other Ambulatory Visit: Payer: Self-pay | Admitting: Physician Assistant

## 2019-03-18 ENCOUNTER — Telehealth: Payer: Self-pay | Admitting: Physician Assistant

## 2019-03-18 MED ORDER — HYDROCODONE-ACETAMINOPHEN 5-325 MG PO TABS: 1.0000 | ORAL_TABLET | Freq: Four times a day (QID) | ORAL | 0 refills | Status: AC | PRN

## 2019-03-18 MED ORDER — HYDROCODONE-ACETAMINOPHEN 5-325 MG PO TABS: 1.0000 | ORAL_TABLET | Freq: Four times a day (QID) | ORAL | 0 refills | Status: DC | PRN

## 2019-03-18 NOTE — Telephone Encounter (Signed)
Patient left a voicemail message stating that she is not able to take the tramadol that [facility] prescribed for her, she is requesting something else for pain to be called into her pharmacy.  CB#[phone].  Thank you.

## 2019-03-18 NOTE — Telephone Encounter (Signed)
Please advise 

## 2019-03-19 NOTE — Telephone Encounter (Signed)
Sent new pain med in on Friday

## 2019-03-23 NOTE — Telephone Encounter (Signed)
Appointment scheduled.

## 2019-04-15 ENCOUNTER — Encounter: Payer: Self-pay | Admitting: Physical Medicine and Rehabilitation

## 2019-04-15 ENCOUNTER — Ambulatory Visit (INDEPENDENT_AMBULATORY_CARE_PROVIDER_SITE_OTHER): Payer: Medicare Other | Admitting: Physical Medicine and Rehabilitation

## 2019-04-15 ENCOUNTER — Other Ambulatory Visit: Payer: Self-pay

## 2019-04-15 DIAGNOSIS — R202 Paresthesia of skin: Secondary | ICD-10-CM

## 2019-04-15 NOTE — Progress Notes (Signed)
  Numeric Pain Rating Scale and Functional Assessment Average Pain 5   In the last MONTH (on 0-10 scale) has pain interfered with the following?  1. General activity like being  able to carry out your everyday physical activities such as walking, climbing stairs, carrying groceries, or moving a chair?  Rating(10 

## 2019-04-18 ENCOUNTER — Other Ambulatory Visit: Payer: Self-pay

## 2019-04-18 ENCOUNTER — Encounter: Payer: Self-pay | Admitting: Physician Assistant

## 2019-04-18 ENCOUNTER — Ambulatory Visit: Payer: Medicare Other | Admitting: Physician Assistant

## 2019-04-18 VITALS — Ht 67.0 in | Wt 219.0 lb

## 2019-04-18 DIAGNOSIS — R221 Localized swelling, mass and lump, neck: Secondary | ICD-10-CM | POA: Diagnosis not present

## 2019-04-18 DIAGNOSIS — G5621 Lesion of ulnar nerve, right upper limb: Secondary | ICD-10-CM | POA: Diagnosis not present

## 2019-04-18 MED ORDER — ACETAMINOPHEN-CODEINE #3 300-30 MG PO TABS: 1.0000 | ORAL_TABLET | ORAL | 0 refills | Status: AC | PRN

## 2019-04-18 NOTE — Progress Notes (Signed)
Katrina [name] - 64 y.o. female MRN [id number]  Date of birth: 03-28-1956  Office Visit Note: Visit Date: 04/15/2019 PCP: Shirline Frees, MD Referred by: Shirline Frees, MD  Subjective: Chief Complaint  Patient presents with  . Right Hand - Numbness   HPI: Katrina [name] is a 64 y.o. female who comes in today For electrodiagnostic study of the right upper limb at the request of Katrina Stabile, PA-C.  Patient is right-hand dominant with history of left carpal tunnel release.  We completed electrodiagnostic study on the left upper limb many years ago and those reports are in the [facility] system.  Katrina [name] refer her for possible carpal tunnel syndrome versus cervical radiculopathy.  His notes show that the patient was having more symptoms into the radial digits of the right hand.  Today the patient is complaining of numbness in the third fourth and fifth digits of the right hand but can also have symptoms into the index finger at times.  She reports right-sided neck pain and cannot lie on the right shoulder.  She reports popping in the shoulder at times.  She does endorse this area of puffiness at the anterior base of the neck on the right side.  She reports that this was not there when she saw Katrina [name] and Dr. Ninfa Linden.  She reports that her symptoms are worse after sitting for a long period of time.  It does hurt to wash dishes and she seems to have been dropping objects.  She reports her average pain is a 5 out of 10.  She denies any left-sided complaints.  She is a chronic smoker and does have COPD.  ROS Otherwise per HPI.  Assessment & Plan: Visit Diagnoses:  1. Paresthesia of skin     Plan:  Impression: The above electrodiagnostic study is ABNORMAL and reveals evidence of a 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   There is also evidence of a very mild right median nerve neuropathy at the wrist but this could also  be due to technical artifact or temperature artifact.   There is no significant electrodiagnostic evidence of any other focal nerve entrapment, brachial plexopathy or cervical radiculopathy.   Recommendations: 1.  Follow-up with referring physician. 2.  Continue current management of symptoms. 3.  Suggest surgical evaluation for the cubital tunnel.  Suggest evaluation and possible MRI cervical spine do to soft tissue swelling in the neck.  Meds & Orders: No orders of the defined types were placed in this encounter.   Orders Placed This Encounter  Procedures  . NCV with EMG (electromyography)    Follow-up: Return for Katrina Stabile, PA-C.   Procedures:  EMG & NCV Findings: Evaluation of the right median motor nerve showed decreased conduction velocity (Elbow-Wrist, 49 m/s).  The right ulnar motor nerve showed decreased conduction velocity (A Elbow-B Elbow, 42 m/s).  The right median (across palm) sensory nerve showed prolonged distal peak latency (Wrist, 3.7 ms) and prolonged distal peak latency (Palm, 2.3 ms).  The right ulnar sensory nerve showed prolonged distal peak latency (6.1 ms), reduced amplitude (10.6 V), and decreased conduction velocity (Wrist-5th Digit, 23 m/s).  All remaining nerves (as indicated in the following tables) were within normal limits.    All examined muscles (as indicated in the following table) showed no evidence of electrical instability.    Impression: The above electrodiagnostic study is ABNORMAL and reveals evidence of a moderate to severe  right ulnar nerve entrapment at the elbow (cubital tunnel syndrome) affecting sensory and motor components.   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   There is no significant electrodiagnostic evidence of any other focal nerve entrapment, brachial plexopathy or cervical radiculopathy.   Recommendations: 1.   Follow-up with referring physician. 2.  Continue current management of symptoms. 3.  Suggest surgical evaluation for the cubital tunnel.  Suggest evaluation and possible MRI cervical spine do to soft tissue swelling in the neck.  ___________________________ Katrina [name] Board Certified, American Board of Physical Medicine and Rehabilitation    Nerve Conduction Studies Anti Sensory Summary Table   Stim Site NR Peak (ms) Norm Peak (ms) P-T Amp (V) Norm P-T Amp Site1 Site2 Delta-P (ms) Dist (cm) Vel (m/s) Norm Vel (m/s)  Right Median Acr Palm Anti Sensory (2nd Digit)  31C  Wrist    *3.7 <3.6 10.1 >10 Wrist Palm 1.4 0.0    Palm    *2.3 <2.0 7.9         Right Radial Anti Sensory (Base 1st Digit)  31.4C  Wrist    2.3 <3.1 10.7  Wrist Base 1st Digit 2.3 0.0    Right Ulnar Anti Sensory (5th Digit)  31.6C  Wrist    *6.1 <3.7 *10.6 >15.0 Wrist 5th Digit 6.1 14.0 *23 >38   Motor Summary Table   Stim Site NR Onset (ms) Norm Onset (ms) O-P Amp (mV) Norm O-P Amp Site1 Site2 Delta-0 (ms) Dist (cm) Vel (m/s) Norm Vel (m/s)  Right Median Motor (Abd Poll Brev)  31.7C  Wrist    3.5 <4.2 8.5 >5 Elbow Wrist 4.1 20.0 *49 >50  Elbow    7.6  8.5         Right Ulnar Motor (Abd Dig Min)  31.5C  Wrist    3.3 <4.2 7.8 >3 B Elbow Wrist 3.3 19.0 58 >53  B Elbow    6.6  7.4  A Elbow B Elbow 2.4 10.0 *42 >53  A Elbow    9.0  3.8          EMG   Side Muscle Nerve Root Ins Act Fibs Psw Amp Dur Poly Recrt Int Fraser Din Comment  Right Abd Poll Brev Median C8-T1 Nml Nml Nml Nml Nml 0 Nml Nml   Right 1stDorInt Ulnar C8-T1 Nml Nml Nml Nml Nml 0 Nml Nml   Right PronatorTeres Median C6-7 Nml Nml Nml Nml Nml 0 Nml Nml   Right Biceps Musculocut C5-6 Nml Nml Nml Nml Nml 0 Nml Nml   Right Deltoid Axillary C5-6 Nml Nml Nml Nml Nml 0 Nml Nml     Nerve Conduction Studies Anti Sensory Left/Right Comparison   Stim Site L Lat (ms) R Lat (ms) L-R Lat (ms) L Amp (V) R Amp (V) L-R Amp (%) Site1 Site2 L Vel (m/s) R  Vel (m/s) L-R Vel (m/s)  Median Acr Palm Anti Sensory (2nd Digit)  31C  Wrist  *3.7   10.1  Wrist Palm     Palm  *2.3   7.9        Radial Anti Sensory (Base 1st Digit)  31.4C  Wrist  2.3   10.7  Wrist Base 1st Digit     Ulnar Anti Sensory (5th Digit)  31.6C  Wrist  *6.1   *10.6  Wrist 5th Digit  *23    Motor  Left/Right Comparison   Stim Site L Lat (ms) R Lat (ms) L-R Lat (ms) L Amp (mV) R Amp (mV) L-R Amp (%) Site1 Site2 L Vel (m/s) R Vel (m/s) L-R Vel (m/s)  Median Motor (Abd Poll Brev)  31.7C  Wrist  3.5   8.5  Elbow Wrist  *49   Elbow  7.6   8.5        Ulnar Motor (Abd Dig Min)  31.5C  Wrist  3.3   7.8  B Elbow Wrist  58   B Elbow  6.6   7.4  A Elbow B Elbow  *42   A Elbow  9.0   3.8           Waveforms:             Clinical History: No specialty comments available.   She reports that she has been smoking cigarettes. She has a 6.25 pack-year smoking history. She has never used smokeless tobacco. No results for input(s): HGBA1C, LABURIC in the last 8760 hours.  Objective:  VS:  HT:    WT:   BMI:     BP:   HR: bpm  TEMP: ( )  RESP:  Physical Exam Neck:     Comments: Soft tissue swelling at the anterior base of the spine above the clavicle.  This is not indurated or painful.  Feels compressible.  Could be lipoma. Musculoskeletal:        General: No swelling, tenderness or deformity.     Cervical back: Normal range of motion and neck supple. No rigidity or tenderness.     Comments: Inspection reveals no atrophy of the bilateral APB or FDI or hand intrinsics.  Equivocal Benedictine sign.  There is no swelling, color changes, allodynia or dystrophic changes. There is 5 out of 5 strength in the bilateral wrist extension, finger abduction and long finger flexion. There is intact sensation to light touch in all dermatomal and peripheral nerve distributions. There is a equivocally positive Froment's test on the right.  There is a negative Hoffmann's test bilaterally.    Skin:    General: Skin is warm and dry.     Findings: No erythema or rash.  Neurological:     General: No focal deficit present.     Mental Status: She is alert and oriented to person, place, and time.     Motor: No weakness or abnormal muscle tone.     Coordination: Coordination normal.  Psychiatric:        Mood and Affect: Mood normal.        Behavior: Behavior normal.     Ortho Exam Imaging: No results found.  Past Medical/Family/Surgical/Social History: Medications & Allergies reviewed per EMR, new medications updated. Patient Active Problem List   Diagnosis Date Noted  . Postmenopausal bleeding 06/16/2018  . S/P hysterectomy 06/16/2018  . Chronic right shoulder pain 10/28/2016  . Impingement syndrome of right shoulder 10/28/2016  . Osteoarthritis of left knee 12/15/2014  . Status post total left knee replacement 12/15/2014  . Hand pain, left 04/21/2014  . Cat bite of finger 04/21/2014  . Asthma 04/21/2014  . Tobacco abuse 04/21/2014  . COPD (chronic obstructive pulmonary disease) ([place])   . Effusion of left knee joint 11/03/2013  . Arthritis of right knee 12/03/2012  . Degenerative arthritis of hip 05/02/2011   Past Medical History:  Diagnosis Date  . Anxiety   . Arthritis    knees, hips, elbows   . Asthma  hx of asthma 3/12- hospitalized   . COPD (chronic obstructive pulmonary disease) ([place])   . Depression   . GERD (gastroesophageal reflux disease)    PRIOR TO HAVING GALLBLADDER REMOVED  . Headache   . Lung infection 2015  . Lung infection   . volvulus     previous history of twisted intestines   History reviewed. No pertinent family history. Past Surgical History:  Procedure Laterality Date  . BACK SURGERY     x2  . CARPAL TUNNEL RELEASE     left   . CESAREAN SECTION     x 2  . CHOLECYSTECTOMY    . COLONOSCOPY  10/2017  . DILATATION & CURETTAGE/HYSTEROSCOPY WITH MYOSURE N/A 12/10/2017   Procedure: DILATATION & CURETTAGE/HYSTEROSCOPY WITH  MYOSURE;  Surgeon: Christophe Louis, MD;  Location: [facility];  Service: Gynecology;  Laterality: N/A;  POSSIBLE MYOMECTOMY VS POLYPECTOMY WITH MYOSURE  . DILATION AND CURETTAGE OF UTERUS    . JOINT REPLACEMENT    . KNEE ARTHROSCOPY     bilateral x 2   . KNEE ARTHROSCOPY Left 11/03/2013   Procedure: LEFT KNEE ARTHROSCOPY WITH DEBRIDEMENT, PARTIAL SYNOVECTOMY;  Surgeon: Mcarthur Rossetti, MD;  Location: [facility];  Service: Orthopedics;  Laterality: Left;  . LAPAROSCOPY    . left elbow surgery     . OTHER SURGICAL HISTORY     arthroswcopic surgery right knee   . OTHER SURGICAL HISTORY     arthroscopic surgery left knee x 2   . OTHER SURGICAL HISTORY     bunionectomy right foot   . OTHER SURGICAL HISTORY     C Section x 2   . OTHER SURGICAL HISTORY     carpal tunnel on left   . right foot bunionectomy     . ROBOTIC ASSISTED TOTAL HYSTERECTOMY WITH BILATERAL SALPINGO OOPHERECTOMY Bilateral 06/16/2018   Procedure: XI ROBOTIC ASSISTED TOTAL HYSTERECTOMY WITH RIGHT SALPINGO OOPHORECTOMY;  Surgeon: Christophe Louis, MD;  Location: [facility];  Service: Gynecology;  Laterality: Bilateral;  . TOTAL HIP ARTHROPLASTY  05/02/2011   Procedure: TOTAL HIP ARTHROPLASTY ANTERIOR APPROACH;  Surgeon: Mcarthur Rossetti, MD;  Location: [facility];  Service: Orthopedics;  Laterality: Left;  Left Total Hip Arthroplasty, Direct Anterior Approach  . TOTAL KNEE ARTHROPLASTY Right 12/03/2012   Procedure: RIGHT TOTAL KNEE ARTHROPLASTY;  Surgeon: Mcarthur Rossetti, MD;  Location: [facility];  Service: Orthopedics;  Laterality: Right;  . TOTAL KNEE ARTHROPLASTY Left 12/15/2014   Procedure: LEFT TOTAL KNEE ARTHROPLASTY;  Surgeon: Mcarthur Rossetti, MD;  Location: [facility];  Service: Orthopedics;  Laterality: Left;  . TUBAL LIGATION    . ULNAR NERVE TRANSPOSITION     left   . UPPER GI ENDOSCOPY     x 2   Social History   Occupational History  . Not on file  Tobacco Use  . Smoking status: Current Every Day  Smoker    Packs/day: 0.25    Years: 25.00    Pack years: 6.25    Types: Cigarettes  . Smokeless tobacco: Never Used  . Tobacco comment: Pt reports that she is in the process of quitting  Substance and Sexual Activity  . Alcohol use: Yes    Comment: occasional beer   . Drug use: No    Comment: hx of 34 years ago marijuana   . Sexual activity: Not on file

## 2019-04-18 NOTE — Procedures (Signed)
EMG & NCV Findings: Evaluation of the right median motor nerve showed decreased conduction velocity (Elbow-Wrist, 49 m/s).  The right ulnar motor nerve showed decreased conduction velocity (A Elbow-B Elbow, 42 m/s).  The right median (across palm) sensory nerve showed prolonged distal peak latency (Wrist, 3.7 ms) and prolonged distal peak latency (Palm, 2.3 ms).  The right ulnar sensory nerve showed prolonged distal peak latency (6.1 ms), reduced amplitude (10.6 V), and decreased conduction velocity (Wrist-5th Digit, 23 m/s).  All remaining nerves (as indicated in the following tables) were within normal limits.    All examined muscles (as indicated in the following table) showed no evidence of electrical instability.    Impression: The above electrodiagnostic study is ABNORMAL and reveals evidence of a 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   There is no significant electrodiagnostic evidence of any other focal nerve entrapment, brachial plexopathy or cervical radiculopathy.   Recommendations: 1.  Follow-up with referring physician. 2.  Continue current management of symptoms. 3.  Suggest surgical evaluation for the cubital tunnel.  Suggest evaluation and possible MRI cervical spine do to soft tissue swelling in the neck.  ___________________________ Wonda Olds Board Certified, American Board of Physical Medicine and Rehabilitation    Nerve Conduction Studies Anti Sensory Summary Table   Stim Site NR Peak (ms) Norm Peak (ms) P-T Amp (V) Norm P-T Amp Site1 Site2 Delta-P (ms) Dist (cm) Vel (m/s) Norm Vel (m/s)  Right Median Acr Palm Anti Sensory (2nd Digit)  31C  Wrist    *3.7 <3.6 10.1 >10 Wrist Palm 1.4 0.0    Palm    *2.3 <2.0 7.9          Right Radial Anti Sensory (Base 1st Digit)  31.4C  Wrist    2.3 <3.1 10.7  Wrist Base 1st Digit 2.3 0.0    Right Ulnar Anti Sensory (5th Digit)  31.6C  Wrist    *6.1 <3.7 *10.6 >15.0 Wrist 5th Digit 6.1 14.0 *23 >38   Motor Summary Table   Stim Site NR Onset (ms) Norm Onset (ms) O-P Amp (mV) Norm O-P Amp Site1 Site2 Delta-0 (ms) Dist (cm) Vel (m/s) Norm Vel (m/s)  Right Median Motor (Abd Poll Brev)  31.7C  Wrist    3.5 <4.2 8.5 >5 Elbow Wrist 4.1 20.0 *49 >50  Elbow    7.6  8.5         Right Ulnar Motor (Abd Dig Min)  31.5C  Wrist    3.3 <4.2 7.8 >3 B Elbow Wrist 3.3 19.0 58 >53  B Elbow    6.6  7.4  A Elbow B Elbow 2.4 10.0 *42 >53  A Elbow    9.0  3.8          EMG   Side Muscle Nerve Root Ins Act Fibs Psw Amp Dur Poly Recrt Int Fraser Din Comment  Right Abd Poll Brev Median C8-T1 Nml Nml Nml Nml Nml 0 Nml Nml   Right 1stDorInt Ulnar C8-T1 Nml Nml Nml Nml Nml 0 Nml Nml   Right PronatorTeres Median C6-7 Nml Nml Nml Nml Nml 0 Nml Nml   Right Biceps Musculocut C5-6 Nml Nml Nml Nml Nml 0 Nml Nml   Right Deltoid Axillary C5-6 Nml Nml Nml Nml Nml 0  Nml Nml     Nerve Conduction Studies Anti Sensory Left/Right Comparison   Stim Site L Lat (ms) R Lat (ms) L-R Lat (ms) L Amp (V) R Amp (V) L-R Amp (%) Site1 Site2 L Vel (m/s) R Vel (m/s) L-R Vel (m/s)  Median Acr Palm Anti Sensory (2nd Digit)  31C  Wrist  *3.7   10.1  Wrist Palm     Palm  *2.3   7.9        Radial Anti Sensory (Base 1st Digit)  31.4C  Wrist  2.3   10.7  Wrist Base 1st Digit     Ulnar Anti Sensory (5th Digit)  31.6C  Wrist  *6.1   *10.6  Wrist 5th Digit  *23    Motor Left/Right Comparison   Stim Site L Lat (ms) R Lat (ms) L-R Lat (ms) L Amp (mV) R Amp (mV) L-R Amp (%) Site1 Site2 L Vel (m/s) R Vel (m/s) L-R Vel (m/s)  Median Motor (Abd Poll Brev)  31.7C  Wrist  3.5   8.5  Elbow Wrist  *49   Elbow  7.6   8.5        Ulnar Motor (Abd Dig Min)  31.5C  Wrist  3.3   7.8  B Elbow Wrist  58   B Elbow  6.6   7.4  A  Elbow B Elbow  *42   A Elbow  9.0   3.8           Waveforms:

## 2019-04-18 NOTE — Progress Notes (Signed)
Office Visit Note   Patient: Katrina [name]           Date of Birth: January 07, 1956           MRN: [id number] Visit Date: 04/18/2019              Requested by: Katrina Frees, MD [address],  [place] [address] PCP: Katrina Frees, MD   Assessment & Plan: Visit Diagnoses:  1. Cubital tunnel syndrome on right   2. Neck mass     Plan: Recommend right cubital tunnel release with possible transposition ulnar nerve.  She understands risk benefits of this procedure as she has had this performed on the left.  In regards to the anterior neck mass we will refer her to ENT for evaluation of this.  See her back in 2 weeks status post surgery.  Questions encouraged and answered at length.  Follow-Up Instructions: No follow-ups on file.   Orders:  No orders of the defined types were placed in this encounter.  Meds ordered this encounter  Medications  . acetaminophen-codeine (TYLENOL #3) 300-30 MG tablet    Sig: Take 1 tablet by mouth every 4 (four) hours as needed for up to 5 days for moderate pain.    Dispense:  30 tablet    Refill:  0      Procedures: No procedures performed   Clinical Data: No additional findings.   Subjective: Chief Complaint  Patient presents with  . Neck - Follow-up, Pain    EMG/NCS Review  . Right Arm - Follow-up, Pain    EMG/NCS Review    HPI Katrina [name] returns today to go over the EMG nerve conduction studies of her upper extremities for right hand numbness.  She states she continues to have pain in the entire right arm and occasionally drops things due to numbness in the hand.  EMG nerve conduction studies performed by Dr. Ernestina [name] showed an abnormal study with evidence of moderate to severe right ulnar nerve entrapment at the elbow.  She also notes that she has had a mass form since we saw her last.  States that she tried tramadol and Norco both for the pain and was unable to tolerate these.  She is asking for her Tylenol 3  to take at night.  Review of Systems See HPI   Objective: Vital Signs: Ht 5\' 7"  (1.702 m)   Wt 219 lb (99.3 kg)   BMI 34.30 kg/m   Physical Exam Constitutional:      General: She is not in acute distress.    Appearance: She is not diaphoretic.  Neurological:     Mental Status: She is alert and oriented to person, place, and time.  Psychiatric:        Mood and Affect: Mood normal.     Ortho Exam Right elbow she has tenderness over the ulnar nerve and a positive Tinel's.   Neck: She has tenderness at the anterior base of the neck is proximal to the clavicle.  She has tenderness over the clavicle with palpation.  There is fullness in this area none not able to palpate any definite margins of any type of mass.  There is no erythema or abnormal warmth compared to the left side. Specialty Comments:  No specialty comments available.  Imaging: No results found.   PMFS History: Patient Active Problem List   Diagnosis Date Noted  . Postmenopausal bleeding 06/16/2018  . S/P hysterectomy 06/16/2018  . Chronic  right shoulder pain 10/28/2016  . Impingement syndrome of right shoulder 10/28/2016  . Osteoarthritis of left knee 12/15/2014  . Status post total left knee replacement 12/15/2014  . Hand pain, left 04/21/2014  . Cat bite of finger 04/21/2014  . Asthma 04/21/2014  . Tobacco abuse 04/21/2014  . COPD (chronic obstructive pulmonary disease) ([place])   . Effusion of left knee joint 11/03/2013  . Arthritis of right knee 12/03/2012  . Degenerative arthritis of hip 05/02/2011   Past Medical History:  Diagnosis Date  . Anxiety   . Arthritis    knees, hips, elbows   . Asthma    hx of asthma 3/12- hospitalized   . COPD (chronic obstructive pulmonary disease) ([place])   . Depression   . GERD (gastroesophageal reflux disease)    PRIOR TO HAVING GALLBLADDER REMOVED  . Headache   . Lung infection 2015  . Lung infection   . volvulus     previous history of twisted intestines      No family history on file.  Past Surgical History:  Procedure Laterality Date  . BACK SURGERY     x2  . CARPAL TUNNEL RELEASE     left   . CESAREAN SECTION     x 2  . CHOLECYSTECTOMY    . COLONOSCOPY  10/2017  . DILATATION & CURETTAGE/HYSTEROSCOPY WITH MYOSURE N/A 12/10/2017   Procedure: DILATATION & CURETTAGE/HYSTEROSCOPY WITH MYOSURE;  Surgeon: Katrina Louis, MD;  Location: [facility];  Service: Gynecology;  Laterality: N/A;  POSSIBLE MYOMECTOMY VS POLYPECTOMY WITH MYOSURE  . DILATION AND CURETTAGE OF UTERUS    . JOINT REPLACEMENT    . KNEE ARTHROSCOPY     bilateral x 2   . KNEE ARTHROSCOPY Left 11/03/2013   Procedure: LEFT KNEE ARTHROSCOPY WITH DEBRIDEMENT, PARTIAL SYNOVECTOMY;  Surgeon: Katrina Rossetti, MD;  Location: [facility];  Service: Orthopedics;  Laterality: Left;  . LAPAROSCOPY    . left elbow surgery     . OTHER SURGICAL HISTORY     arthroswcopic surgery right knee   . OTHER SURGICAL HISTORY     arthroscopic surgery left knee x 2   . OTHER SURGICAL HISTORY     bunionectomy right foot   . OTHER SURGICAL HISTORY     C Section x 2   . OTHER SURGICAL HISTORY     carpal tunnel on left   . right foot bunionectomy     . ROBOTIC ASSISTED TOTAL HYSTERECTOMY WITH BILATERAL SALPINGO OOPHERECTOMY Bilateral 06/16/2018   Procedure: XI ROBOTIC ASSISTED TOTAL HYSTERECTOMY WITH RIGHT SALPINGO OOPHORECTOMY;  Surgeon: Katrina Louis, MD;  Location: [facility];  Service: Gynecology;  Laterality: Bilateral;  . TOTAL HIP ARTHROPLASTY  05/02/2011   Procedure: TOTAL HIP ARTHROPLASTY ANTERIOR APPROACH;  Surgeon: Katrina Rossetti, MD;  Location: [facility];  Service: Orthopedics;  Laterality: Left;  Left Total Hip Arthroplasty, Direct Anterior Approach  . TOTAL KNEE ARTHROPLASTY Right 12/03/2012   Procedure: RIGHT TOTAL KNEE ARTHROPLASTY;  Surgeon: Katrina Rossetti, MD;  Location: [facility];  Service: Orthopedics;  Laterality: Right;  . TOTAL KNEE ARTHROPLASTY Left 12/15/2014    Procedure: LEFT TOTAL KNEE ARTHROPLASTY;  Surgeon: Katrina Rossetti, MD;  Location: [facility];  Service: Orthopedics;  Laterality: Left;  . TUBAL LIGATION    . ULNAR NERVE TRANSPOSITION     left   . UPPER GI ENDOSCOPY     x 2   Social History   Occupational History  . Not on file  Tobacco  Use  . Smoking status: Current Every Day Smoker    Packs/day: 0.25    Years: 25.00    Pack years: 6.25    Types: Cigarettes  . Smokeless tobacco: Never Used  . Tobacco comment: Pt reports that she is in the process of quitting  Substance and Sexual Activity  . Alcohol use: Yes    Comment: occasional beer   . Drug use: No    Comment: hx of 34 years ago marijuana   . Sexual activity: Not on file

## 2019-04-19 ENCOUNTER — Other Ambulatory Visit: Payer: Self-pay | Admitting: Radiology

## 2019-04-19 DIAGNOSIS — R221 Localized swelling, mass and lump, neck: Secondary | ICD-10-CM

## 2019-04-26 DIAGNOSIS — R221 Localized swelling, mass and lump, neck: Secondary | ICD-10-CM | POA: Diagnosis not present

## 2019-04-26 DIAGNOSIS — M25511 Pain in right shoulder: Secondary | ICD-10-CM | POA: Diagnosis not present

## 2019-04-27 DIAGNOSIS — R221 Localized swelling, mass and lump, neck: Secondary | ICD-10-CM | POA: Insufficient documentation

## 2019-04-27 DIAGNOSIS — M25511 Pain in right shoulder: Secondary | ICD-10-CM | POA: Insufficient documentation

## 2019-05-07 ENCOUNTER — Other Ambulatory Visit: Payer: Self-pay | Admitting: Otolaryngology

## 2019-05-07 DIAGNOSIS — M25511 Pain in right shoulder: Secondary | ICD-10-CM

## 2019-05-31 ENCOUNTER — Other Ambulatory Visit: Payer: Medicare Other

## 2019-06-02 ENCOUNTER — Other Ambulatory Visit: Payer: Self-pay | Admitting: Orthopaedic Surgery

## 2019-06-02 DIAGNOSIS — I739 Peripheral vascular disease, unspecified: Secondary | ICD-10-CM | POA: Diagnosis not present

## 2019-06-02 DIAGNOSIS — Z79899 Other long term (current) drug therapy: Secondary | ICD-10-CM | POA: Diagnosis not present

## 2019-06-02 DIAGNOSIS — I350 Nonrheumatic aortic (valve) stenosis: Secondary | ICD-10-CM | POA: Diagnosis not present

## 2019-06-02 DIAGNOSIS — E785 Hyperlipidemia, unspecified: Secondary | ICD-10-CM | POA: Diagnosis not present

## 2019-06-02 DIAGNOSIS — G5621 Lesion of ulnar nerve, right upper limb: Secondary | ICD-10-CM | POA: Diagnosis not present

## 2019-06-02 DIAGNOSIS — I25119 Atherosclerotic heart disease of native coronary artery with unspecified angina pectoris: Secondary | ICD-10-CM | POA: Diagnosis not present

## 2019-06-02 MED ORDER — ONDANSETRON 4 MG PO TBDP: 4.0000 mg | ORAL_TABLET | Freq: Three times a day (TID) | ORAL | 0 refills | Status: DC | PRN

## 2019-06-02 MED ORDER — HYDROCODONE-ACETAMINOPHEN 5-325 MG PO TABS: 1.0000 | ORAL_TABLET | Freq: Four times a day (QID) | ORAL | 0 refills | Status: DC | PRN

## 2019-06-14 ENCOUNTER — Ambulatory Visit
Admission: RE | Admit: 2019-06-14 | Discharge: 2019-06-14 | Disposition: A | Discharge status: Home / Self Care | Payer: Medicare Other | Source: Ambulatory Visit | Attending: Otolaryngology | Admitting: Otolaryngology

## 2019-06-14 ENCOUNTER — Other Ambulatory Visit: Payer: Self-pay

## 2019-06-14 DIAGNOSIS — M25511 Pain in right shoulder: Secondary | ICD-10-CM

## 2019-06-16 ENCOUNTER — Ambulatory Visit (INDEPENDENT_AMBULATORY_CARE_PROVIDER_SITE_OTHER): Payer: Medicare Other | Admitting: Physician Assistant

## 2019-06-16 ENCOUNTER — Other Ambulatory Visit: Payer: Self-pay

## 2019-06-16 ENCOUNTER — Encounter: Payer: Self-pay | Admitting: Physician Assistant

## 2019-06-16 DIAGNOSIS — G5621 Lesion of ulnar nerve, right upper limb: Secondary | ICD-10-CM

## 2019-06-16 NOTE — Progress Notes (Signed)
HPI: Katrina [name] returns now 2 weeks status post right ulnar nerve release.  She states overall that the numbness is improving in her right hand ulnar distribution.  She has had no fevers chills shortness of breath chest pain.  Physical exam: Right hand full motor sensation over the ulnar distribution is intact to light touch.  Radial pulse intact.  Surgical incisions healing well and well approximated with interrupted nylon sutures no signs of infection or wound dehiscence.  Impression: 2-week status post right ulnar nerve release  Plan: She will work on gentle range of motion of the elbow.  Scar tissue mobilization.  Sutures removed today.  Steri-Strips applied.  We will see her back in 1 month to see what type of progress she has made.

## 2019-07-14 ENCOUNTER — Other Ambulatory Visit: Payer: Self-pay

## 2019-07-14 ENCOUNTER — Encounter: Payer: Self-pay | Admitting: Physician Assistant

## 2019-07-14 ENCOUNTER — Ambulatory Visit (INDEPENDENT_AMBULATORY_CARE_PROVIDER_SITE_OTHER): Payer: Medicare Other | Admitting: Physician Assistant

## 2019-07-14 DIAGNOSIS — M542 Cervicalgia: Secondary | ICD-10-CM

## 2019-07-14 DIAGNOSIS — R221 Localized swelling, mass and lump, neck: Secondary | ICD-10-CM

## 2019-07-14 DIAGNOSIS — M25511 Pain in right shoulder: Secondary | ICD-10-CM

## 2019-07-14 NOTE — Progress Notes (Signed)
HPI: Ms. Seamon returns now 4 weeks status post right cubital tunnel release.  She states overall the pain in her hand that she is having is resolved.  She has had no problems with the incision.  She however feels that the mass on the right side of her anterior neck is becoming larger and is causing her more or symptoms into her shoulder and up into her cervical spine area.  She was unable to tolerate the MRI that Dr. Erik Obey ordered due to the coil that they had to apply to her neck.  She has been trying to have the MRI repeated of her neck under sedation but reports that she has been I am able to get Dr. Stephanie Acre office to order the the MRI under sedation.  Physical exam right elbow surgical incisions well-healed.  She has full range of motion the elbow without pain.  Full motor of the right hand.  Sensation is subjectively intact throughout the hand particularly over the ulnar nerve distribution.  Impression: Status post right ulnar nerve release 4 weeks Right anterior neck mass  Plan: We will order the MRI of her neck soft tissue with and without contrast to evaluate the soft tissue mass this will need to be done under sedation.  She will follow-up with Dr. Astrid Drafts office after the MRI if she has problems getting an appointment with them she can definitely call us and we can try to facilitate this.  Regards to her elbow should follow-up with [place] as needed.  Questions were encouraged and answered

## 2019-07-25 ENCOUNTER — Other Ambulatory Visit (HOSPITAL_COMMUNITY)
Admission: RE | Admit: 2019-07-25 | Discharge: 2019-07-25 | Disposition: A | Discharge status: Home / Self Care | Payer: Medicare Other | Source: Ambulatory Visit | Attending: Physician Assistant | Admitting: Physician Assistant

## 2019-07-25 DIAGNOSIS — Z20822 Contact with and (suspected) exposure to covid-19: Secondary | ICD-10-CM | POA: Insufficient documentation

## 2019-07-25 DIAGNOSIS — Z01812 Encounter for preprocedural laboratory examination: Secondary | ICD-10-CM | POA: Diagnosis not present

## 2019-07-25 LAB — SARS CORONAVIRUS 2 (TAT 6-24 HRS): SARS Coronavirus 2: NEGATIVE

## 2019-07-27 ENCOUNTER — Other Ambulatory Visit: Payer: Self-pay

## 2019-07-27 ENCOUNTER — Encounter (HOSPITAL_COMMUNITY): Payer: Self-pay | Admitting: Physician Assistant

## 2019-07-27 NOTE — Progress Notes (Signed)
Pt denies SOB, chest pain, and being under the care of a cardiologist. Pt stated that PCP is Dr. William Harris. Pt denies having  stress test, echo and cardiac cath. Pt denies having an EKG and chest x ray. Pt denies recent labs. Pt made aware to stop taking all vitamins and herbal medications. Pt reminded to quarantine. Pt verbalized understanding of all pre-op instructions. °

## 2019-07-27 NOTE — Anesthesia Preprocedure Evaluation (Addendum)
Anesthesia Evaluation  Patient identified by MRN, date of birth, ID band Patient awake    Reviewed: Allergy & Precautions, NPO status , Patient's Chart, lab work & pertinent test results  Airway Mallampati: II  TM Distance: >3 FB Neck ROM: Full    Dental  (+) Dental Advisory Given, Chipped,    Pulmonary asthma , COPD,  COPD inhaler, Current Smoker,    breath sounds clear to auscultation       Cardiovascular + dysrhythmias  Rhythm:Regular Rate:Normal     Neuro/Psych  Headaches, PSYCHIATRIC DISORDERS Anxiety Depression    GI/Hepatic Neg liver ROS, GERD  ,  Endo/Other  negative endocrine ROS  Renal/GU negative Renal ROS     Musculoskeletal  (+) Arthritis ,   Abdominal Normal abdominal exam  (+)   Peds  Hematology negative hematology ROS (+)   Anesthesia Other Findings   Reproductive/Obstetrics                            Anesthesia Physical Anesthesia Plan  ASA: III  Anesthesia Plan: MAC   Post-op Pain Management:    Induction: Intravenous  PONV Risk Score and Plan: Ondansetron  Airway Management Planned: Natural Airway  Additional Equipment: None  Intra-op Plan:   Post-operative Plan:   Informed Consent: I have reviewed the patients History and Physical, chart, labs and discussed the procedure including the risks, benefits and alternatives for the proposed anesthesia with the patient or authorized representative who has indicated his/her understanding and acceptance.       Plan Discussed with: CRNA  Anesthesia Plan Comments: (PAT note written 07/27/2019 by Myra Gianotti, PA-C. )      Anesthesia Quick Evaluation

## 2019-07-27 NOTE — Progress Notes (Signed)
Anesthesia Chart Review: Katrina [name]   Case: [id number] Date/Time: 07/28/19 0945   Procedure: MRI WITH ANESTHESIA OF NECK SOFT TISSUE ONLY WITH AND WITHOUT CONTRAST (N/A )   Anesthesia type: General   Pre-op diagnosis: NECK MASS AND NECK PAIN   Location: [facility] OR RADIOLOGY ROOM / [place] [facility]   Surgeons: Radiologist, Medication, MD      DISCUSSION: Patient is a 64 year old female scheduled for the above procedure.  H&P completed by Katrina Emery, PA-C. She is s/p right cubital tunnel release 06/02/19 ([facility]), and in follow-up reported mass on the right side of her anterior neck that seem to be enlarging. Apparently, Dr. Erik [name] had already ordered an MRI for further evaluation, but patient had not been able to complete without sedation.    Other history includes smoking, COPD, GERD, asthma, volvulus, left BBB (present since at least 06/28/10), hysterectomy (06/16/18), TKA (left 2016, right 2014), left THA (2013), back surgery, cholecystectomy. Reported alcohol as "occasional beer".   LBBB since at least 2012, although she denied prior cardiac testing. She has undergone several procedures under anesthesia since then, one as recent as 06/02/19.  She is a smoker, but denied chest pain and SOB per PAT RN phone interview. Anesthesia team to evaluate on the day of procedure. Reviewed with anesthesiologist Katrina Abed, MD.  07/25/2019 preprocedure COVID-19 test negative.   VS:   Wt Readings from Last 3 Encounters:  04/18/19 99.3 kg  11/03/18 98.4 kg  06/16/18 100.9 kg   BP Readings from Last 3 Encounters:  06/17/18 (!) 159/88  06/10/18 (!) 162/87  12/10/17 (!) 141/94   Pulse Readings from Last 3 Encounters:  06/17/18 74  06/10/18 78  12/10/17 92    PROVIDERS: Katrina Frees, MD is PCP  Katrina Marble, MD is ENT   LABS: She is for updated labs on arrival.    EKG: Last EKG seen is from 12/02/17. She had SR, LAD, LBBB on EKGs dating back to at least 06/28/10 (and admission note  for COPD exacerbation then mentions "prior left bundle-branch block on EKG."   CV: She denied prior stress, echo, cath.    Past Medical History:  Diagnosis Date  . Anxiety   . Arthritis    knees, hips, elbows   . Asthma    hx of asthma 3/12- hospitalized   . COPD (chronic obstructive pulmonary disease) ([place])   . Depression   . GERD (gastroesophageal reflux disease)    PRIOR TO HAVING GALLBLADDER REMOVED  . Headache   . Lung infection 2015  . Lung infection   . volvulus     previous history of twisted intestines    Past Surgical History:  Procedure Laterality Date  . BACK SURGERY     x2  . CARPAL TUNNEL RELEASE     left   . CESAREAN SECTION     x 2  . CHOLECYSTECTOMY    . COLONOSCOPY  10/2017  . DILATATION & CURETTAGE/HYSTEROSCOPY WITH MYOSURE N/A 12/10/2017   Procedure: DILATATION & CURETTAGE/HYSTEROSCOPY WITH MYOSURE;  Surgeon: Katrina Louis, MD;  Location: [facility];  Service: Gynecology;  Laterality: N/A;  POSSIBLE MYOMECTOMY VS POLYPECTOMY WITH MYOSURE  . DILATION AND CURETTAGE OF UTERUS    . ELBOW SURGERY    . JOINT REPLACEMENT    . KNEE ARTHROSCOPY     bilateral x 2   . KNEE ARTHROSCOPY Left 11/03/2013   Procedure: LEFT KNEE ARTHROSCOPY WITH DEBRIDEMENT, PARTIAL SYNOVECTOMY;  Surgeon: Katrina Rossetti, MD;  Location:  [facility];  Service: Orthopedics;  Laterality: Left;  . LAPAROSCOPY    . left elbow surgery     . OTHER SURGICAL HISTORY     arthroswcopic surgery right knee   . OTHER SURGICAL HISTORY     arthroscopic surgery left knee x 2   . OTHER SURGICAL HISTORY     bunionectomy right foot   . OTHER SURGICAL HISTORY     C Section x 2   . OTHER SURGICAL HISTORY     carpal tunnel on left   . right foot bunionectomy     . ROBOTIC ASSISTED TOTAL HYSTERECTOMY WITH BILATERAL SALPINGO OOPHERECTOMY Bilateral 06/16/2018   Procedure: XI ROBOTIC ASSISTED TOTAL HYSTERECTOMY WITH RIGHT SALPINGO OOPHORECTOMY;  Surgeon: Katrina Louis, MD;  Location: [facility];   Service: Gynecology;  Laterality: Bilateral;  . TOTAL HIP ARTHROPLASTY  05/02/2011   Procedure: TOTAL HIP ARTHROPLASTY ANTERIOR APPROACH;  Surgeon: Katrina Rossetti, MD;  Location: [facility];  Service: Orthopedics;  Laterality: Left;  Left Total Hip Arthroplasty, Direct Anterior Approach  . TOTAL KNEE ARTHROPLASTY Right 12/03/2012   Procedure: RIGHT TOTAL KNEE ARTHROPLASTY;  Surgeon: Katrina Rossetti, MD;  Location: [facility];  Service: Orthopedics;  Laterality: Right;  . TOTAL KNEE ARTHROPLASTY Left 12/15/2014   Procedure: LEFT TOTAL KNEE ARTHROPLASTY;  Surgeon: Katrina Rossetti, MD;  Location: [facility];  Service: Orthopedics;  Laterality: Left;  . TUBAL LIGATION    . ULNAR NERVE TRANSPOSITION     left   . UPPER GI ENDOSCOPY     x 2    MEDICATIONS: No current facility-administered medications for this encounter.   Marland Kitchen acetaminophen (TYLENOL) 325 MG tablet  . albuterol (VENTOLIN HFA) 108 (90 Base) MCG/ACT inhaler  . HYDROcodone-acetaminophen (NORCO/VICODIN) 5-325 MG tablet  . ondansetron (ZOFRAN ODT) 4 MG disintegrating tablet     Katrina Gianotti, PA-C Surgical Short Stay/Anesthesiology [facility] Phone [phone] [facility] Phone [phone] 07/27/2019 1:59 PM

## 2019-07-28 ENCOUNTER — Encounter (HOSPITAL_COMMUNITY)
Admission: RE | Disposition: A | Discharge status: Home / Self Care | Payer: Self-pay | Source: Home / Self Care | Attending: Physician Assistant

## 2019-07-28 ENCOUNTER — Ambulatory Visit (HOSPITAL_COMMUNITY): Payer: Medicare Other | Admitting: Vascular Surgery

## 2019-07-28 ENCOUNTER — Other Ambulatory Visit: Payer: Self-pay

## 2019-07-28 ENCOUNTER — Encounter (HOSPITAL_COMMUNITY): Payer: Self-pay | Admitting: Physician Assistant

## 2019-07-28 ENCOUNTER — Ambulatory Visit (HOSPITAL_COMMUNITY)
Admission: RE | Admit: 2019-07-28 | Discharge: 2019-07-28 | Disposition: A | Discharge status: Home / Self Care | Payer: Medicare Other | Source: Ambulatory Visit | Attending: Physician Assistant | Admitting: Physician Assistant

## 2019-07-28 ENCOUNTER — Ambulatory Visit (HOSPITAL_COMMUNITY)
Admission: RE | Admit: 2019-07-28 | Discharge: 2019-07-28 | Disposition: A | Discharge status: Home / Self Care | Payer: Medicare Other | Attending: Physician Assistant | Admitting: Physician Assistant

## 2019-07-28 DIAGNOSIS — Z79899 Other long term (current) drug therapy: Secondary | ICD-10-CM | POA: Diagnosis not present

## 2019-07-28 DIAGNOSIS — F17210 Nicotine dependence, cigarettes, uncomplicated: Secondary | ICD-10-CM | POA: Insufficient documentation

## 2019-07-28 DIAGNOSIS — F418 Other specified anxiety disorders: Secondary | ICD-10-CM | POA: Diagnosis not present

## 2019-07-28 DIAGNOSIS — R221 Localized swelling, mass and lump, neck: Secondary | ICD-10-CM

## 2019-07-28 DIAGNOSIS — Z96653 Presence of artificial knee joint, bilateral: Secondary | ICD-10-CM | POA: Diagnosis not present

## 2019-07-28 DIAGNOSIS — J449 Chronic obstructive pulmonary disease, unspecified: Secondary | ICD-10-CM | POA: Insufficient documentation

## 2019-07-28 DIAGNOSIS — G8929 Other chronic pain: Secondary | ICD-10-CM | POA: Insufficient documentation

## 2019-07-28 DIAGNOSIS — J45909 Unspecified asthma, uncomplicated: Secondary | ICD-10-CM | POA: Diagnosis not present

## 2019-07-28 DIAGNOSIS — M25511 Pain in right shoulder: Secondary | ICD-10-CM

## 2019-07-28 DIAGNOSIS — I447 Left bundle-branch block, unspecified: Secondary | ICD-10-CM | POA: Diagnosis not present

## 2019-07-28 DIAGNOSIS — K219 Gastro-esophageal reflux disease without esophagitis: Secondary | ICD-10-CM | POA: Insufficient documentation

## 2019-07-28 DIAGNOSIS — M7541 Impingement syndrome of right shoulder: Secondary | ICD-10-CM | POA: Diagnosis not present

## 2019-07-28 DIAGNOSIS — M542 Cervicalgia: Secondary | ICD-10-CM

## 2019-07-28 HISTORY — PX: RADIOLOGY WITH ANESTHESIA: SHX6223

## 2019-07-28 SURGERY — MRI WITH ANESTHESIA
Anesthesia: Monitor Anesthesia Care

## 2019-07-28 MED ORDER — MIDAZOLAM HCL 5 MG/5ML IJ SOLN
INTRAMUSCULAR | Status: DC | PRN
  Administered 2019-07-28: 2 mg via INTRAVENOUS

## 2019-07-28 MED ORDER — FENTANYL CITRATE (PF) 100 MCG/2ML IJ SOLN
INTRAMUSCULAR | Status: DC | PRN
  Administered 2019-07-28: 50 ug via INTRAVENOUS
  Administered 2019-07-28: 25 ug via INTRAVENOUS

## 2019-07-28 MED ORDER — LACTATED RINGERS IV SOLN: INTRAVENOUS | Status: DC | PRN

## 2019-07-28 MED ORDER — LABETALOL HCL 5 MG/ML IV SOLN
5.0000 mg | Freq: Once | INTRAVENOUS | Status: AC
  Administered 2019-07-28: 5 mg via INTRAVENOUS
  Filled 2019-07-28: qty 4

## 2019-07-28 MED ORDER — ONDANSETRON HCL 4 MG/2ML IJ SOLN
INTRAMUSCULAR | Status: DC | PRN
  Administered 2019-07-28: 4 mg via INTRAVENOUS

## 2019-07-28 MED ORDER — DEXMEDETOMIDINE HCL IN NACL 200 MCG/50ML IV SOLN
INTRAVENOUS | Status: DC | PRN
  Administered 2019-07-28 (×2): 8 ug via INTRAVENOUS
  Administered 2019-07-28: 4 ug via INTRAVENOUS
  Administered 2019-07-28: 8 ug via INTRAVENOUS

## 2019-07-28 MED ORDER — GADOBUTROL 1 MMOL/ML IV SOLN
10.0000 mL | Freq: Once | INTRAVENOUS | Status: AC | PRN
  Administered 2019-07-28: 10 mL via INTRAVENOUS

## 2019-07-28 MED ORDER — PROPOFOL 500 MG/50ML IV EMUL
INTRAVENOUS | Status: DC | PRN
  Administered 2019-07-28: 50 ug/kg/min via INTRAVENOUS
  Administered 2019-07-28: 30 ug/kg/min via INTRAVENOUS

## 2019-07-28 MED ORDER — LABETALOL HCL 5 MG/ML IV SOLN
5.0000 mg | Freq: Once | INTRAVENOUS | Status: AC
  Administered 2019-07-28: 5 mg via INTRAVENOUS

## 2019-07-28 MED ORDER — PROPOFOL 10 MG/ML IV BOLUS
INTRAVENOUS | Status: DC | PRN
  Administered 2019-07-28: 30 mg via INTRAVENOUS

## 2019-07-28 NOTE — H&P (Signed)
Subjective:  Patient to undergo MRI of neck with sedation due severe claustrophobia.   Patient is a 64 y.o. female with right shoulder mass anterior neck that is becoming larger and causing her more pain and symptoms of weakness right upper extremity.  She was unable to tolerate an MRI that Dr. Erik Obey had ordered due to the coil that had to be placed around her neck caused severe claustrophobia.  Therefore she is sent to radiology to undergo an MRI under sedation.  Patient Active Problem List   Diagnosis Date Noted  . Postmenopausal bleeding 06/16/2018  . S/P hysterectomy 06/16/2018  . Chronic right shoulder pain 10/28/2016  . Impingement syndrome of right shoulder 10/28/2016  . Osteoarthritis of left knee 12/15/2014  . Status post total left knee replacement 12/15/2014  . Hand pain, left 04/21/2014  . Cat bite of finger 04/21/2014  . Asthma 04/21/2014  . Tobacco abuse 04/21/2014  . COPD (chronic obstructive pulmonary disease) ([place])   . Effusion of left knee joint 11/03/2013  . Arthritis of right knee 12/03/2012  . Degenerative arthritis of hip 05/02/2011   Past Medical History:  Diagnosis Date  . Anxiety   . Arthritis    knees, hips, elbows   . Asthma    hx of asthma 3/12- hospitalized   . COPD (chronic obstructive pulmonary disease) ([place])   . Depression   . GERD (gastroesophageal reflux disease)    PRIOR TO HAVING GALLBLADDER REMOVED  . Headache   . Left bundle branch block 06/28/2010  . Lung infection 2015  . Lung infection   . volvulus     previous history of twisted intestines    Past Surgical History:  Procedure Laterality Date  . BACK SURGERY     x2  . CARPAL TUNNEL RELEASE     left   . CESAREAN SECTION     x 2  . CHOLECYSTECTOMY    . COLONOSCOPY  10/2017  . DILATATION & CURETTAGE/HYSTEROSCOPY WITH MYOSURE N/A 12/10/2017   Procedure: DILATATION & CURETTAGE/HYSTEROSCOPY WITH MYOSURE;  Surgeon: Christophe Louis, MD;  Location: [facility];  Service: Gynecology;   Laterality: N/A;  POSSIBLE MYOMECTOMY VS POLYPECTOMY WITH MYOSURE  . DILATION AND CURETTAGE OF UTERUS    . ELBOW SURGERY    . JOINT REPLACEMENT    . KNEE ARTHROSCOPY     bilateral x 2   . KNEE ARTHROSCOPY Left 11/03/2013   Procedure: LEFT KNEE ARTHROSCOPY WITH DEBRIDEMENT, PARTIAL SYNOVECTOMY;  Surgeon: Mcarthur Rossetti, MD;  Location: [facility];  Service: Orthopedics;  Laterality: Left;  . LAPAROSCOPY    . left elbow surgery     . OTHER SURGICAL HISTORY     arthroswcopic surgery right knee   . OTHER SURGICAL HISTORY     arthroscopic surgery left knee x 2   . OTHER SURGICAL HISTORY     bunionectomy right foot   . OTHER SURGICAL HISTORY     C Section x 2   . OTHER SURGICAL HISTORY     carpal tunnel on left   . right foot bunionectomy     . ROBOTIC ASSISTED TOTAL HYSTERECTOMY WITH BILATERAL SALPINGO OOPHERECTOMY Bilateral 06/16/2018   Procedure: XI ROBOTIC ASSISTED TOTAL HYSTERECTOMY WITH RIGHT SALPINGO OOPHORECTOMY;  Surgeon: Christophe Louis, MD;  Location[facility];  Service: Gynecology;  Laterality: Bilateral;  . TOTAL HIP ARTHROPLASTY  05/02/2011   Procedure: TOTAL HIP ARTHROPLASTY ANTERIOR APPROACH;  Surgeon: Mcarthur Rossetti, MD;  Location: [facility];  Service: Orthopedics;  Laterality:  Left;  Left Total Hip Arthroplasty, Direct Anterior Approach  . TOTAL KNEE ARTHROPLASTY Right 12/03/2012   Procedure: RIGHT TOTAL KNEE ARTHROPLASTY;  Surgeon: Mcarthur Rossetti, MD;  Location: [facility];  Service: Orthopedics;  Laterality: Right;  . TOTAL KNEE ARTHROPLASTY Left 12/15/2014   Procedure: LEFT TOTAL KNEE ARTHROPLASTY;  Surgeon: Mcarthur Rossetti, MD;  Location: [facility];  Service: Orthopedics;  Laterality: Left;  . TUBAL LIGATION    . ULNAR NERVE TRANSPOSITION     left   . UPPER GI ENDOSCOPY     x 2    No current facility-administered medications for this encounter.   Allergies  Allergen Reactions  . Codeine Itching and Nausea Only    Social History    Tobacco Use  . Smoking status: Current Every Day Smoker    Packs/day: 0.25    Years: 25.00    Pack years: 6.25    Types: Cigarettes  . Smokeless tobacco: Never Used  . Tobacco comment: Pt reports that she is in the process of quitting  Substance Use Topics  . Alcohol use: Yes    Comment: occasional beer     Family History  Problem Relation Age of Onset  . Heart disease Mother     Review of Systems Negative for fevers, chills, SOB or chest pain.  Objective: Vital signs in last 24 hours: Temp:  [97.7 F (36.5 C)] 97.7 F (36.5 C) (04/22 0758) Pulse Rate:  [85-87] 87 (04/22 0842) Resp:  [20] 20 (04/22 0758) BP: (196-200)/(92-107) 196/92 (04/22 0842) SpO2:  [97 %] 97 % (04/22 0758) Weight:  [99.3 kg] 99.3 kg (04/22 0758)  PE: General: Well developed well nourished. No acute distress. Psych: Alert and oriented x3 Neck: Tenderness at the anterior base of the neck proximal clavicle.  She also has tenderness over the clavicle outpatient.  Fullness noted in in the anterior neck clavicle region.  Unable to palpate definite margins of any type of metastatic.  There is no erythema or abnormal warmth compared to the left side. Respiratory: No respiratory distress.  Imaging Review Cervical spine 2 view dated 03/07/2019 are reviewed and shows loss of lordotic curvature.  Degenerative spine changes throughout the mid cervical spine.  No spondylolisthesis no acute fractures no soft tissue abnormalities.  No evidence of bony destruction or osteomyelitis.  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  MRI with contrast to evaluate a mass which is to be done under sedation becuase the patient is claustrophobic.  She will follow-up with [place] post MRI to go over the results and discuss further treatment.

## 2019-07-28 NOTE — Anesthesia Postprocedure Evaluation (Signed)
Anesthesia Post Note  Patient: Katrina [name]  Procedure(s) Performed: MRI WITH ANESTHESIA OF NECK SOFT TISSUE ONLY WITH AND WITHOUT CONTRAST (N/A )     Patient location during evaluation: PACU Anesthesia Type: MAC Level of consciousness: awake and alert Pain management: pain level controlled Vital Signs Assessment: post-procedure vital signs reviewed and stable Respiratory status: spontaneous breathing, nonlabored ventilation, respiratory function stable and patient connected to nasal cannula oxygen Cardiovascular status: stable and blood pressure returned to baseline Postop Assessment: no apparent nausea or vomiting Anesthetic complications: no    Last Vitals:  Vitals:   07/28/19 1210 07/28/19 1215  BP: (!) 142/69   Pulse: 83   Resp: 16   Temp:  36.7 C  SpO2: 93%     Last Pain:  Vitals:   07/28/19 1210  TempSrc:   PainSc: 0-No pain                 Effie Berkshire

## 2019-07-28 NOTE — Progress Notes (Signed)
Patient arrived to short stay with elevated BP.  Patient states she does not have a history of HTN.  Dr. Smith Robert made aware.  Received verbal order for 5mg  of labetalol IV once.  Will continue to monitor patient.

## 2019-07-28 NOTE — Transfer of Care (Signed)
Immediate Anesthesia Transfer of Care Note  Patient: Katrina [name]  Procedure(s) Performed: MRI WITH ANESTHESIA OF NECK SOFT TISSUE ONLY WITH AND WITHOUT CONTRAST (N/A )  Patient Location: PACU  Anesthesia Type:MAC  Level of Consciousness: awake, alert  and oriented  Airway & Oxygen Therapy: Patient Spontanous Breathing and Patient connected to face mask oxygen  Post-op Assessment: Report given to RN and Post -op Vital signs reviewed and stable  Post vital signs: Reviewed and stable  Last Vitals:  Vitals Value Taken Time  BP 127/79 07/28/19 1156  Temp 36.5 C 07/28/19 1155  Pulse 84 07/28/19 1159  Resp 14 07/28/19 1159  SpO2 92 % 07/28/19 1159  Vitals shown include unvalidated device data.  Last Pain:  Vitals:   07/28/19 1155  TempSrc:   PainSc: 0-No pain      Patients Stated Pain Goal: 0 (75/79/72 8206)  Complications: No apparent anesthesia complications

## 2019-07-28 NOTE — Anesthesia Procedure Notes (Signed)
Procedure Name: MAC Date/Time: 07/28/2019 10:30 AM Performed by: Imagene Riches, CRNA Pre-anesthesia Checklist: Patient identified, Emergency Drugs available, Suction available, Patient being monitored and Timeout performed Patient Re-evaluated:Patient Re-evaluated prior to induction Oxygen Delivery Method: Simple face mask

## 2019-08-04 DIAGNOSIS — M542 Cervicalgia: Secondary | ICD-10-CM | POA: Insufficient documentation

## 2019-08-12 ENCOUNTER — Telehealth: Payer: Self-pay | Admitting: Physician Assistant

## 2019-08-12 NOTE — Telephone Encounter (Signed)
Holding for Gil/Brynn on Monday

## 2019-08-12 NOTE — Telephone Encounter (Signed)
Patient states she was referred to a Ear,Nose & Throat provider by Artis Delay. Whom she saw on 08/04/19 and was told by the provider that there was nothing he could do and that she need to be seen by a spine specialist. He told her to call our office to see Dr. Louanne Skye. Pt wanted Gil's advice

## 2019-08-15 NOTE — Telephone Encounter (Signed)
We can send her to [facility] for evaluation of her neck pain if she wants.

## 2019-08-16 NOTE — Telephone Encounter (Signed)
I scheduled for 08/24/19 @ 1030

## 2019-08-16 NOTE — Telephone Encounter (Signed)
Can we get her in somewhere maybe not so far away possibly?

## 2019-08-24 ENCOUNTER — Ambulatory Visit: Payer: Medicare Other | Admitting: Specialist

## 2019-08-24 ENCOUNTER — Other Ambulatory Visit: Payer: Self-pay

## 2019-08-24 ENCOUNTER — Ambulatory Visit: Payer: Self-pay

## 2019-08-24 ENCOUNTER — Encounter: Payer: Self-pay | Admitting: Specialist

## 2019-08-24 VITALS — BP 157/92 | HR 93 | Ht 67.0 in | Wt 219.0 lb

## 2019-08-24 DIAGNOSIS — M4722 Other spondylosis with radiculopathy, cervical region: Secondary | ICD-10-CM

## 2019-08-24 DIAGNOSIS — M5412 Radiculopathy, cervical region: Secondary | ICD-10-CM | POA: Diagnosis not present

## 2019-08-24 DIAGNOSIS — M25511 Pain in right shoulder: Secondary | ICD-10-CM | POA: Diagnosis not present

## 2019-08-24 DIAGNOSIS — M542 Cervicalgia: Secondary | ICD-10-CM

## 2019-08-24 DIAGNOSIS — G5621 Lesion of ulnar nerve, right upper limb: Secondary | ICD-10-CM

## 2019-08-24 MED ORDER — CLONAZEPAM 0.5 MG PO TABS: 0.5000 mg | ORAL_TABLET | Freq: Two times a day (BID) | ORAL | 0 refills | Status: DC | PRN

## 2019-08-24 MED ORDER — DICLOFENAC SODIUM 50 MG PO TBEC: DELAYED_RELEASE_TABLET | ORAL | 3 refills | Status: DC

## 2019-08-24 MED ORDER — GABAPENTIN 400 MG PO CAPS: 400.0000 mg | ORAL_CAPSULE | Freq: Every day | ORAL | 3 refills | Status: DC

## 2019-08-24 NOTE — Progress Notes (Addendum)
Office Visit Note   Patient: Katrina [name]           Date of Birth: Aug 17, 1955           MRN: [id number] Visit Date: 08/24/2019              Requested by: Shirline Frees, MD [place],  [place] [address] PCP: Shirline Frees, MD   Assessment & Plan: Visit Diagnoses:  1. Neck pain     Plan: Avoid overhead lifting and overhead use of the arms. Do not lift greater than 5 lbs. Adjust head rest in vehicle to prevent hyperextension if rear ended. Take extra precautions to avoid falling. MRI of the right shoulder and cervical spine, the previous MRI of the Neck did not evaluate the spinal cord or nerves within the cervical canal And a new study is necessary. MRI of the right shoulder due to pain with ROM and popping sensation with severe pain, this is mechanical and suggests and intrinsic right Shoulder pathology. It is possible to have two conditions at the same time and evaluation of both areas is appropriate due to mechanical  symptoms.  Klonopin 0.5 mg BID for muscle spasm Gabapentin or neurontin 400 mg Diclofenac 50 mg po daily for 4-5 days then BID for inflammation of the joints Stop motrin or alleve or naprosyn.   Hemp CBD capsules, amazon.com 5,000-7,000 mg per bottle, 60 capsules per bottle, take one capsule twice a day.  Follow-Up Instructions: No follow-ups on file.  Follow-Up Instructions: No follow-ups on file.   Orders:  Orders Placed This Encounter  Procedures  . XR Cervical Spine 2 or 3 views   No orders of the defined types were placed in this encounter.     Procedures: No procedures performed   Clinical Data: No additional findings.   Subjective: Chief Complaint  Patient presents with  . Neck - Pain  . Right Shoulder - Pain    64 year old right handed female with history of right neck pain with swelling in the right supraclavicular area. It is associated with right arm pain. Pain in the right medial scapula and  right neck. Lying with a pillow right side and the right arm down by her chest side. Lying on the back is painful. She is lying on her stomach or left side to relieve the pain and to sleep. She is learning to do things left handed including driving. She is having trouble using the right arm for writing and it takes 2 hands to lift a coffee pot and is unable to do yard work any more. With bringing her arm back she feels popping in the right shoulder and she has pain with raking using the right arm. Much of her pain is over the anterior right shoulder with numbness into the right index finger. In the past the pain was into the right ulna 2 digits prior to right ulna nerve transposition about 4 months ago. Left ulna nerve transposition about 3 years ago. She is able to stand and walk fine. No bowel or bladder difficulty. The right neck is painful with ROM with pain along the medial right scapula border. Dropping items with right hand, grabs with the left hand now. Pain is severe and she reports that she is unable to sleep past midnight due to discomfort. She is helping to care for husband with recent partial leg amputation and has a daughter who recently was diagnosed with brain and lung  lesions that have responded to chemotherapy. She does not work.   Review of Systems  Constitutional: Negative.  Negative for activity change, appetite change, chills, diaphoresis, fatigue, fever and unexpected weight change.  HENT: Positive for congestion, rhinorrhea, sinus pressure, sinus pain and sneezing. Negative for dental problem, drooling, ear discharge, ear pain, facial swelling, hearing loss, mouth sores, nosebleeds, postnasal drip, sore throat, tinnitus, trouble swallowing and voice change.   Eyes: Positive for pain, redness and itching. Negative for photophobia, discharge and visual disturbance.  Respiratory: Positive for chest tightness, shortness of breath and wheezing. Negative for cough, choking and stridor.     Cardiovascular: Negative.  Negative for chest pain, palpitations and leg swelling.  Gastrointestinal: Negative.  Negative for abdominal distention, abdominal pain, anal bleeding, blood in stool, constipation, diarrhea, nausea, rectal pain and vomiting.  Endocrine: Positive for cold intolerance. Negative for heat intolerance, polydipsia, polyphagia and polyuria.  Genitourinary: Negative.  Negative for difficulty urinating, dyspareunia, dysuria and urgency.  Musculoskeletal: Positive for neck pain and neck stiffness. Negative for arthralgias, back pain, gait problem, joint swelling and myalgias.  Skin: Negative.  Negative for color change, pallor, rash and wound.  Allergic/Immunologic: Positive for environmental allergies. Negative for food allergies and immunocompromised state.  Neurological: Positive for weakness and headaches. Negative for dizziness, tremors, seizures, syncope, facial asymmetry, speech difficulty, light-headedness and numbness.  Hematological: Negative.  Negative for adenopathy. Does not bruise/bleed easily.  Psychiatric/Behavioral: Negative.  Negative for agitation, behavioral problems, confusion, decreased concentration, dysphoric mood, hallucinations, self-injury, sleep disturbance and suicidal ideas. The patient is not nervous/anxious and is not hyperactive.      Objective: Vital Signs: BP (!) 157/92 (BP Location: Left Arm, Patient Position: Sitting)   Pulse 93   Ht 5\' 7"  (1.702 m)   Wt 219 lb (99.3 kg)   BMI 34.30 kg/m   Physical Exam Constitutional:      Appearance: She is well-developed.  HENT:     Head: Normocephalic and atraumatic.  Eyes:     Pupils: Pupils are equal, round, and reactive to light.  Pulmonary:     Effort: Pulmonary effort is normal.     Breath sounds: Normal breath sounds.  Abdominal:     General: Bowel sounds are normal.     Palpations: Abdomen is soft.  Musculoskeletal:     Cervical back: Normal range of motion and neck supple.      Lumbar back: Negative right straight leg raise test and negative left straight leg raise test.  Skin:    General: Skin is warm and dry.  Neurological:     Mental Status: She is alert and oriented to person, place, and time.  Psychiatric:        Behavior: Behavior normal.        Thought Content: Thought content normal.        Judgment: Judgment normal.     Back Exam   Tenderness  The patient is experiencing tenderness in the cervical.  Range of Motion  Extension:  30 abnormal  Flexion:  50 abnormal  Lateral bend right:  30 abnormal  Lateral bend left:  60 abnormal  Rotation right: 20   Muscle Strength  Right Quadriceps:  5/5  Left Quadriceps:  5/5  Right Hamstrings:  5/5  Left Hamstrings:  5/5   Tests  Straight leg raise right: negative Straight leg raise left: negative  Reflexes  Patellar: normal Achilles: normal Biceps: 0/4 Babinski's sign: normal   Other  Toe walk: normal Heel walk: normal  Sensation: decreased Gait: normal   Comments:  Right hand intrinsics including finger abduction, adduction and opposition is weak 4/5, wrist volar flexion is normal but there is ulna dorsal wrist extension weakness. Biceps strength is decreased on the right 5-/5. She has pain with shoulder ROM that limits evaluation of the right shoulder. Tender right medial scapula and right SSA. Tender right cervical lateral masses C4 to C7.      Specialty Comments:  No specialty comments available.  Imaging: No results found.   PMFS History: Patient Active Problem List   Diagnosis Date Noted  . Postmenopausal bleeding 06/16/2018  . S/P hysterectomy 06/16/2018  . Chronic right shoulder pain 10/28/2016  . Impingement syndrome of right shoulder 10/28/2016  . Osteoarthritis of left knee 12/15/2014  . Status post total left knee replacement 12/15/2014  . Hand pain, left 04/21/2014  . Cat bite of finger 04/21/2014  . Asthma 04/21/2014  . Tobacco abuse 04/21/2014  . COPD  (chronic obstructive pulmonary disease) ([place])   . Effusion of left knee joint 11/03/2013  . Arthritis of right knee 12/03/2012  . Degenerative arthritis of hip 05/02/2011   Past Medical History:  Diagnosis Date  . Anxiety   . Arthritis    knees, hips, elbows   . Asthma    hx of asthma 3/12- hospitalized   . COPD (chronic obstructive pulmonary disease) ([place])   . Depression   . GERD (gastroesophageal reflux disease)    PRIOR TO HAVING GALLBLADDER REMOVED  . Headache   . Left bundle branch block 06/28/2010  . Lung infection 2015  . Lung infection   . volvulus     previous history of twisted intestines    Family History  Problem Relation Age of Onset  . Heart disease Mother     Past Surgical History:  Procedure Laterality Date  . BACK SURGERY     x2  . CARPAL TUNNEL RELEASE     left   . CESAREAN SECTION     x 2  . CHOLECYSTECTOMY    . COLONOSCOPY  10/2017  . DILATATION & CURETTAGE/HYSTEROSCOPY WITH MYOSURE N/A 12/10/2017   Procedure: DILATATION & CURETTAGE/HYSTEROSCOPY WITH MYOSURE;  Surgeon: Christophe Louis, MD;  Location: [facility];  Service: Gynecology;  Laterality: N/A;  POSSIBLE MYOMECTOMY VS POLYPECTOMY WITH MYOSURE  . DILATION AND CURETTAGE OF UTERUS    . ELBOW SURGERY    . JOINT REPLACEMENT    . KNEE ARTHROSCOPY     bilateral x 2   . KNEE ARTHROSCOPY Left 11/03/2013   Procedure: LEFT KNEE ARTHROSCOPY WITH DEBRIDEMENT, PARTIAL SYNOVECTOMY;  Surgeon: Mcarthur Rossetti, MD;  Location: [facility];  Service: Orthopedics;  Laterality: Left;  . LAPAROSCOPY    . left elbow surgery     . OTHER SURGICAL HISTORY     arthroswcopic surgery right knee   . OTHER SURGICAL HISTORY     arthroscopic surgery left knee x 2   . OTHER SURGICAL HISTORY     bunionectomy right foot   . OTHER SURGICAL HISTORY     C Section x 2   . OTHER SURGICAL HISTORY     carpal tunnel on left   . RADIOLOGY WITH ANESTHESIA N/A 07/28/2019   Procedure: MRI WITH ANESTHESIA OF NECK SOFT TISSUE ONLY WITH AND  WITHOUT CONTRAST;  Surgeon: Radiologist, Medication, MD;  Location: [facility];  Service: Radiology;  Laterality: N/A;  . right foot bunionectomy     . ROBOTIC ASSISTED TOTAL HYSTERECTOMY WITH BILATERAL SALPINGO OOPHERECTOMY Bilateral  06/16/2018   Procedure: XI ROBOTIC ASSISTED TOTAL HYSTERECTOMY WITH RIGHT SALPINGO OOPHORECTOMY;  Surgeon: Christophe Louis, MD;  Location: [facility];  Service: Gynecology;  Laterality: Bilateral;  . TOTAL HIP ARTHROPLASTY  05/02/2011   Procedure: TOTAL HIP ARTHROPLASTY ANTERIOR APPROACH;  Surgeon: Mcarthur Rossetti, MD;  Location: [facility];  Service: Orthopedics;  Laterality: Left;  Left Total Hip Arthroplasty, Direct Anterior Approach  . TOTAL KNEE ARTHROPLASTY Right 12/03/2012   Procedure: RIGHT TOTAL KNEE ARTHROPLASTY;  Surgeon: Mcarthur Rossetti, MD;  Location: [facility];  Service: Orthopedics;  Laterality: Right;  . TOTAL KNEE ARTHROPLASTY Left 12/15/2014   Procedure: LEFT TOTAL KNEE ARTHROPLASTY;  Surgeon: Mcarthur Rossetti, MD;  Location: [facility];  Service: Orthopedics;  Laterality: Left;  . TUBAL LIGATION    . ULNAR NERVE TRANSPOSITION     left   . UPPER GI ENDOSCOPY     x 2   Social History   Occupational History  . Not on file  Tobacco Use  . Smoking status: Current Every Day Smoker    Packs/day: 0.25    Years: 25.00    Pack years: 6.25    Types: Cigarettes  . Smokeless tobacco: Never Used  . Tobacco comment: Pt reports that she is in the process of quitting  Substance and Sexual Activity  . Alcohol use: Yes    Comment: occasional beer   . Drug use: No    Comment: hx of 34 years ago marijuana   . Sexual activity: Not on file

## 2019-08-24 NOTE — Patient Instructions (Addendum)
Plan: Avoid overhead lifting and overhead use of the arms. Do not lift greater than 5 lbs. Adjust head rest in vehicle to prevent hyperextension if rear ended. Take extra precautions to avoid falling. MRI of the right shoulder and cervical spine, the previous MRI of the Neck did not evaluate the spinal cord or nerves within the cervical canal And a new study is necessary. MRI of the right shoulder due to pain with ROM and popping sensation with severe pain, this is mechanical and suggests and intrinsic right Shoulder pathology. It is possible to have two conditions at the same time and evaluation of both areas is appropriate due to mechanical  symptoms.  Klonopin 0.5 mg BID for muscle spasm Gabapentin or neurontin 400 mg po qhs for nerve pain Diclofenac 50 mg po daily for 4-5 days then BID for inflammation of the joints Stop motrin or alleve or naprosyn.   Hemp CBD capsules, amazon.com 5,000-7,000 mg per bottle, 60 capsules per bottle, take one capsule twice a day for pain.

## 2019-09-29 ENCOUNTER — Ambulatory Visit: Payer: Medicare Other | Admitting: Specialist

## 2019-09-30 NOTE — Progress Notes (Signed)
Nurse lvm with Katrina [name], Surgical Coordinator, regarding the need for a new H&P within 30 days of MRI; awaiting a return call.

## 2019-10-01 ENCOUNTER — Ambulatory Visit (HOSPITAL_COMMUNITY)
Admission: RE | Admit: 2019-10-01 | Discharge: 2019-10-01 | Disposition: A | Discharge status: Home / Self Care | Payer: Medicare Other | Source: Ambulatory Visit | Attending: Specialist | Admitting: Specialist

## 2019-10-01 DIAGNOSIS — Z01812 Encounter for preprocedural laboratory examination: Secondary | ICD-10-CM | POA: Insufficient documentation

## 2019-10-01 DIAGNOSIS — Z20822 Contact with and (suspected) exposure to covid-19: Secondary | ICD-10-CM | POA: Insufficient documentation

## 2019-10-01 LAB — SARS CORONAVIRUS 2 (TAT 6-24 HRS): SARS Coronavirus 2: NEGATIVE

## 2019-10-03 ENCOUNTER — Encounter (HOSPITAL_COMMUNITY): Payer: Self-pay | Admitting: *Deleted

## 2019-10-03 ENCOUNTER — Other Ambulatory Visit: Payer: Self-pay

## 2019-10-03 ENCOUNTER — Encounter: Payer: Self-pay | Admitting: Specialist

## 2019-10-03 ENCOUNTER — Telehealth: Payer: Self-pay | Admitting: *Deleted

## 2019-10-03 ENCOUNTER — Ambulatory Visit (INDEPENDENT_AMBULATORY_CARE_PROVIDER_SITE_OTHER): Payer: Medicare Other | Admitting: Specialist

## 2019-10-03 VITALS — BP 135/83 | HR 99 | Temp 98.0°F | Ht 67.0 in | Wt 215.8 lb

## 2019-10-03 DIAGNOSIS — M4722 Other spondylosis with radiculopathy, cervical region: Secondary | ICD-10-CM | POA: Diagnosis not present

## 2019-10-03 DIAGNOSIS — R221 Localized swelling, mass and lump, neck: Secondary | ICD-10-CM

## 2019-10-03 DIAGNOSIS — G5621 Lesion of ulnar nerve, right upper limb: Secondary | ICD-10-CM

## 2019-10-03 NOTE — Progress Notes (Signed)
Pt denies SOB, chest pain, and being under the care of a cardiologist. Pt stated that PCP is Dr. Shirline Frees. Pt denies having  stress test, echo and cardiac cath. Pt denies having an EKG and chest x ray. Pt denies recent labs. Pt made aware to stop taking all vitamins and herbal medications. Pt reminded to quarantine. Pt verbalized understanding of all pre-op instructions.

## 2019-10-03 NOTE — Progress Notes (Signed)
Anesthesia Chart Review: SAME DAY WORK-UP   Case: [id number] Date/Time: 10/04/19 0800   Procedure: MRI WITH ANESTHESIA RIGHT SHOULDER WITHOUT CONTRAST,MRI OF CERVICAL SPINE WITHOUT CONTRAST (N/A )   Anesthesia type: General   Pre-op diagnosis: SHOULDER PAIN ,NECK PAIN   Location: [facility] RADIOLOGY ROOM / [facility]   Surgeons: Radiologist, Medication, MD      DISCUSSION: Patient is a 64 year old female scheduled for the above procedure. She is s/p MRI neck soft tissue under MAC anesthesia on 07/28/19 (showed sinus inflammation but no mass). Now she is scheduled for MRI of the right shoulder ordered by Basil Dess, MD with H&P completed on 10/03/19.   History includes smoking, COPD, GERD, asthma, volvulus, left BBB (present since at least 06/28/10), hysterectomy (06/16/18), TKA (left 2016, right 2014), left THA (2013), back surgery, cholecystectomy. Reported alcohol as "occasional beer". She is s/p right cubital tunnel release 06/02/19 ([facility])  LBBB since at least 2012, although she denied prior cardiac testing. She has undergone several procedures under anesthesia since then, one as recent as 06/02/19 and anesthesia for MRI 2 months ago.  She is a smoker, but denied chest pain and SOB per PAT RN phone interview. Anesthesia team to evaluate on the day of procedure.   10/01/19 preprocedure COVID-19 test negative.   VS:  BP Readings from Last 3 Encounters:  10/03/19 135/83  08/24/19 (!) 157/92  07/28/19 (!) 142/69   Pulse Readings from Last 3 Encounters:  10/03/19 99  08/24/19 93  07/28/19 83    PROVIDERS: Shirline Frees, MD is PCP  Jodi Marble, MD is ENT    LABS: For day of procedure as indicated.    IMAGES: MRI neck soft tissue 07/28/19: IMPRESSION: 1. Negative MRI appearance of the Neck. No mass, lymphadenopathy, or abnormality to correspond to the palpable area of concern. 2. Ethmoid and sphenoid sinus inflammation.   EKG: Last EKG seen is from 12/02/17. She  had SR, LAD, LBBB on EKGs dating back to at least 06/28/10 (and admission note for COPD exacerbation then mentions "prior left bundle-branch block on EKG."   CV: She denied prior stress, echo, cath.    Past Medical History:  Diagnosis Date  . Anxiety   . Arthritis    knees, hips, elbows   . Asthma    hx of asthma 3/12- hospitalized   . COPD (chronic obstructive pulmonary disease) ([place])   . Depression   . GERD (gastroesophageal reflux disease)    PRIOR TO HAVING GALLBLADDER REMOVED  . Headache   . Left bundle branch block 06/28/2010  . Lung infection 2015  . Lung infection   . volvulus     previous history of twisted intestines    Past Surgical History:  Procedure Laterality Date  . BACK SURGERY     x2  . CARPAL TUNNEL RELEASE     left   . CESAREAN SECTION     x 2  . CHOLECYSTECTOMY    . COLONOSCOPY  10/2017  . DILATATION & CURETTAGE/HYSTEROSCOPY WITH MYOSURE N/A 12/10/2017   Procedure: DILATATION & CURETTAGE/HYSTEROSCOPY WITH MYOSURE;  Surgeon: Christophe Louis, MD;  Location: [facility];  Service: Gynecology;  Laterality: N/A;  POSSIBLE MYOMECTOMY VS POLYPECTOMY WITH MYOSURE  . DILATION AND CURETTAGE OF UTERUS    . ELBOW SURGERY    . JOINT REPLACEMENT    . KNEE ARTHROSCOPY     bilateral x 2   . KNEE ARTHROSCOPY Left 11/03/2013   Procedure: LEFT KNEE ARTHROSCOPY WITH DEBRIDEMENT,  PARTIAL SYNOVECTOMY;  Surgeon: Mcarthur Rossetti, MD;  Location: [facility];  Service: Orthopedics;  Laterality: Left;  . LAPAROSCOPY    . left elbow surgery     . OTHER SURGICAL HISTORY     arthroswcopic surgery right knee   . OTHER SURGICAL HISTORY     arthroscopic surgery left knee x 2   . OTHER SURGICAL HISTORY     bunionectomy right foot   . OTHER SURGICAL HISTORY     C Section x 2   . OTHER SURGICAL HISTORY     carpal tunnel on left   . RADIOLOGY WITH ANESTHESIA N/A 07/28/2019   Procedure: MRI WITH ANESTHESIA OF NECK SOFT TISSUE ONLY WITH AND WITHOUT CONTRAST;  Surgeon: Radiologist,  Medication, MD;  Location: [facility];  Service: Radiology;  Laterality: N/A;  . right foot bunionectomy     . ROBOTIC ASSISTED TOTAL HYSTERECTOMY WITH BILATERAL SALPINGO OOPHERECTOMY Bilateral 06/16/2018   Procedure: XI ROBOTIC ASSISTED TOTAL HYSTERECTOMY WITH RIGHT SALPINGO OOPHORECTOMY;  Surgeon: Christophe Louis, MD;  Location: [facility];  Service: Gynecology;  Laterality: Bilateral;  . TOTAL HIP ARTHROPLASTY  05/02/2011   Procedure: TOTAL HIP ARTHROPLASTY ANTERIOR APPROACH;  Surgeon: Mcarthur Rossetti, MD;  Location: [facility];  Service: Orthopedics;  Laterality: Left;  Left Total Hip Arthroplasty, Direct Anterior Approach  . TOTAL KNEE ARTHROPLASTY Right 12/03/2012   Procedure: RIGHT TOTAL KNEE ARTHROPLASTY;  Surgeon: Mcarthur Rossetti, MD;  Location: [facility];  Service: Orthopedics;  Laterality: Right;  . TOTAL KNEE ARTHROPLASTY Left 12/15/2014   Procedure: LEFT TOTAL KNEE ARTHROPLASTY;  Surgeon: Mcarthur Rossetti, MD;  Location: [facility];  Service: Orthopedics;  Laterality: Left;  . TUBAL LIGATION    . ULNAR NERVE TRANSPOSITION     left   . UPPER GI ENDOSCOPY     x 2    MEDICATIONS: No current facility-administered medications for this encounter.   Marland Kitchen acetaminophen (TYLENOL) 325 MG tablet  . clonazePAM (KLONOPIN) 0.5 MG tablet     Myra Gianotti, PA-C Surgical Short Stay/Anesthesiology [facility] Phone [phone] [facility] Phone [phone] 10/03/2019 4:49 PM

## 2019-10-03 NOTE — Progress Notes (Addendum)
Office Visit Note   Patient: Katrina [name]           Date of Birth: 1955-09-01           MRN: [id number] Visit Date: 10/03/2019              Requested by: Shirline Frees, MD [address],  [place] [id number] PCP: Shirline Frees, MD   Assessment & Plan: Visit Diagnoses:  1. Other spondylosis with radiculopathy, cervical region   2. Ulnar neuropathy of right upper extremity   3. Neck mass     Plan: Avoid overhead lifting and overhead use of the arms. Do not lift greater than 5 lbs. Adjust head rest in vehicle to prevent hyperextension if rear ended. Take extra precautions to avoid falling. MRI of the cervical spine is ordered.  Today a history and physical was done as a prerequisite to having a general anesthesia for MRI of the cervical spine.    Follow-Up Instructions: No follow-ups on file.   Orders:  No orders of the defined types were placed in this encounter.  No orders of the defined types were placed in this encounter.     Procedures: No procedures performed   Clinical Data: No additional findings.   Subjective: Chief Complaint  Patient presents with  . H&P    64 year old female with neck pain and radiation into the right arm. She has had a transposition of the right ulnar nerve at the elbow.  She is claustrophobic and she has difficulty getting through MRIs. She is seen today for a preprocedure H&P. She is having stiffness int The neck with persistent pain radiating into the right radial 2 fingers    PREProcedure  H&P  Chief Complaint:Cervicalgia with right cervical radiculopathy  HPI: [name] is a 64 y.o. female who presents for preoperative history and physical with a diagnosis of Cervicalgia with right cervical radiculopathy.  Symptoms are rated as moderate to severe, and have been worsening.  This is significantly impairing activities of daily living.  She has elected for surgical management.   Past Medical  History: No date: Anxiety No date: Arthritis     Comment:  knees, hips, elbows  No date: Asthma     Comment:  hx of asthma 3/12- hospitalized  No date: COPD (chronic obstructive pulmonary disease) (HCC) No date: Depression No date: GERD (gastroesophageal reflux disease)     Comment:  PRIOR TO HAVING GALLBLADDER REMOVED No date: Headache 06/28/2010: Left bundle branch block 2015: Lung infection No date: Lung infection No date: volvulus     Comment:   previous history of twisted intestines Past Surgical History: No date: BACK SURGERY     Comment:  x2 No date: CARPAL TUNNEL RELEASE     Comment:  left  No date: CESAREAN SECTION     Comment:  x 2 No date: CHOLECYSTECTOMY 10/2017: COLONOSCOPY 12/10/2017: DILATATION & CURETTAGE/HYSTEROSCOPY WITH MYOSURE; N/A     Comment:  Procedure: DILATATION & CURETTAGE/HYSTEROSCOPY WITH               MYOSURE;  Surgeon: Christophe Louis, MD;  Location: [facility];                Service: Gynecology;  Laterality: N/A;  POSSIBLE               MYOMECTOMY VS POLYPECTOMY WITH MYOSURE No date: DILATION AND CURETTAGE OF UTERUS No date: ELBOW SURGERY No date: JOINT REPLACEMENT No  date: KNEE ARTHROSCOPY     Comment:  bilateral x 2  11/03/2013: KNEE ARTHROSCOPY; Left     Comment:  Procedure: LEFT KNEE ARTHROSCOPY WITH DEBRIDEMENT,               PARTIAL SYNOVECTOMY;  Surgeon: Mcarthur Rossetti,               MD;  Location: [facility];  Service: Orthopedics;                Laterality: Left; No date: LAPAROSCOPY No date: left elbow surgery  No date: OTHER SURGICAL HISTORY     Comment:  arthroswcopic surgery right knee  No date: OTHER SURGICAL HISTORY     Comment:  arthroscopic surgery left knee x 2  No date: OTHER SURGICAL HISTORY     Comment:  bunionectomy right foot  No date: OTHER SURGICAL HISTORY     Comment:  C Section x 2  No date: OTHER SURGICAL HISTORY     Comment:  carpal tunnel on left  07/28/2019: RADIOLOGY WITH ANESTHESIA; N/A     Comment:   Procedure: MRI WITH ANESTHESIA OF NECK SOFT TISSUE ONLY               WITH AND WITHOUT CONTRAST;  Surgeon: Radiologist,               Medication, MD;  Location: [facility];  Service: Radiology;                Laterality: N/A; No date: right foot bunionectomy  06/16/2018: ROBOTIC ASSISTED TOTAL HYSTERECTOMY WITH BILATERAL  SALPINGO OOPHERECTOMY; Bilateral     Comment:  Procedure: XI ROBOTIC ASSISTED TOTAL HYSTERECTOMY WITH               RIGHT SALPINGO OOPHORECTOMY;  Surgeon: Christophe Louis, MD;                Location: [facility];  Service:               Gynecology;  Laterality: Bilateral; 05/02/2011: TOTAL HIP ARTHROPLASTY     Comment:  Procedure: TOTAL HIP ARTHROPLASTY ANTERIOR APPROACH;                Surgeon: Mcarthur Rossetti, MD;  Location: [facility];                Service: Orthopedics;  Laterality: Left;  Left Total Hip               Arthroplasty, Direct Anterior Approach 12/03/2012: TOTAL KNEE ARTHROPLASTY; Right     Comment:  Procedure: RIGHT TOTAL KNEE ARTHROPLASTY;  Surgeon:               Mcarthur Rossetti, MD;  Location: [facility];  Service:               Orthopedics;  Laterality: Right; 12/15/2014: TOTAL KNEE ARTHROPLASTY; Left     Comment:  Procedure: LEFT TOTAL KNEE ARTHROPLASTY;  Surgeon:               Mcarthur Rossetti, MD;  Location: [facility];  Service:               Orthopedics;  Laterality: Left; No date: TUBAL LIGATION No date: ULNAR NERVE TRANSPOSITION     Comment:  left  No date: UPPER GI ENDOSCOPY     Comment:  x 2 Social History   Socioeconomic History     Marital status: Married  Spouse name: Not on file     Number of children: Not on file     Years of education: Not on file     Highest education level: Not on file   Occupational History     Not on file   Tobacco Use     Smoking status: Current Every Day Smoker       Packs/day: 0.25       Years: 25.00       Pack years: 6.25       Types: Cigarettes     Smokeless tobacco: Never Used      Tobacco comment: Pt reports that she is in the process of quitting   Vaping Use     Vaping Use: Never used   Substance and Sexual Activity     Alcohol use: Yes       Comment: occasional beer      Drug use: No       Comment: hx of 34 years ago marijuana      Sexual activity: Not on file   Other Topics     Concerns:       Not on file   Social History Narrative     Not on file  Social Determinants of Health Financial Resource Strain:    Difficulty of Paying Living Expenses:  Food Insecurity:    Worried About Charity fundraiser in the Last Year:    Arboriculturist in the Last Year:  Transportation Needs:    Film/video editor (Medical):    Lack of Transportation (Non-Medical):  Physical Activity:    Days of Exercise per Week:    Minutes of Exercise per Session:  Stress:    Feeling of Stress :  Social Connections:    Frequency of Communication with Friends and Family:    Frequency of Social Gatherings with Friends and Family:    Attends Religious Services:    Active Member of Clubs or Organizations:    Attends Music therapist:    Marital Status:  Review of patient's family history indicates: Problem: Heart disease     Relation: Mother         Age of Onset: (Not Specified)   -- Codeine -- Itching and Nausea Only Prior to Admission medications : Medication acetaminophen (TYLENOL) 325 MG tablet, Sig Take 2 tablets (650 mg total) by mouth every 4 (four) hours as needed for mild pain (temperature > 101.5.).Patient taking differently: Take 650 mg by mouth every 4 (four) hours as needed for mild pain. , Start Date 06/17/18, End Date , Taking? Yes, Authorizing Provider Christophe Louis, MD  Medication clonazePAM (KLONOPIN) 0.5 MG tablet, Sig Take 1 tablet (0.5 mg total) by mouth 2 (two) times daily as needed for anxiety., Start Date 08/24/19, End Date , Taking? Yes, Authorizing Provider Jessy Oto, MD     Positive ROS: All other systems  have been reviewed and were otherwise negative with the exception of those mentioned in the HPI and as above.  Physical Exam: General: Alert, no acute distress Cardiovascular: No pedal edema Respiratory: No cyanosis, no use of accessory musculature GI: No organomegaly, abdomen is soft and non-tender Skin: No lesions in the area of chief complaint Neurologic: Sensation intact distally Psychiatric: Patient is competent for consent with normal mood and affect Lymphatic: No axillary or cervical lymphadenopathy  MUSCULOSKELETAL: Right arm tricep weakness and right arm numbness index and thumb. Painful ROM cervical spine with positive spurling sign. Complaints  of swelling right  Supraclavicular area.   Assessment: Cervical radiculopathy C6 right. History of previous right ulnar n transposition at the elbow.   Plan: Plan for MRI of the cervical spine  Under a general anesthesia due to claustrophobia.  The risks benefits and alternatives were discussed with the patient including but not limited to the risks of nonoperative treatment, versus surgical intervention including infection, bleeding, nerve injury,  blood clots, cardiopulmonary complications, morbidity, mortality, among others, and they were willing to proceed.   Basil Dess, MD Cell [phone] Office [phone] 10/04/2019 7:39 AM     Review of Systems   Objective: Vital Signs: BP 135/83   Pulse 99   Temp 98 F (36.7 C)   Ht 5\' 7"  (1.702 m)   Wt 215 lb 12.8 oz (97.9 kg)   BMI 33.80 kg/m   Physical Exam  Ortho Exam  Specialty Comments:  No specialty comments available.  Imaging: No results found.   PMFS History: Patient Active Problem List   Diagnosis Date Noted  . Postmenopausal bleeding 06/16/2018  . S/P hysterectomy 06/16/2018  . Chronic right shoulder pain 10/28/2016  . Impingement syndrome of right shoulder 10/28/2016  . Osteoarthritis of left knee 12/15/2014  . Status post total left knee  replacement 12/15/2014  . Hand pain, left 04/21/2014  . Cat bite of finger 04/21/2014  . Asthma 04/21/2014  . Tobacco abuse 04/21/2014  . COPD (chronic obstructive pulmonary disease) ([facility])   . Effusion of left knee joint 11/03/2013  . Arthritis of right knee 12/03/2012  . Degenerative arthritis of hip 05/02/2011   Past Medical History:  Diagnosis Date  . Anxiety   . Arthritis    knees, hips, elbows   . Asthma    hx of asthma 3/12- hospitalized   . COPD (chronic obstructive pulmonary disease) ([facility])   . Depression   . GERD (gastroesophageal reflux disease)    PRIOR TO HAVING GALLBLADDER REMOVED  . Headache   . Left bundle branch block 06/28/2010  . Lung infection 2015  . Lung infection   . volvulus     previous history of twisted intestines    Family History  Problem Relation Age of Onset  . Heart disease Mother     Past Surgical History:  Procedure Laterality Date  . BACK SURGERY     x2  . CARPAL TUNNEL RELEASE     left   . CESAREAN SECTION     x 2  . CHOLECYSTECTOMY    . COLONOSCOPY  10/2017  . DILATATION & CURETTAGE/HYSTEROSCOPY WITH MYOSURE N/A 12/10/2017   Procedure: DILATATION & CURETTAGE/HYSTEROSCOPY WITH MYOSURE;  Surgeon: Christophe Louis, MD;  Location: [facility];  Service: Gynecology;  Laterality: N/A;  POSSIBLE MYOMECTOMY VS POLYPECTOMY WITH MYOSURE  . DILATION AND CURETTAGE OF UTERUS    . ELBOW SURGERY    . JOINT REPLACEMENT    . KNEE ARTHROSCOPY     bilateral x 2   . KNEE ARTHROSCOPY Left 11/03/2013   Procedure: LEFT KNEE ARTHROSCOPY WITH DEBRIDEMENT, PARTIAL SYNOVECTOMY;  Surgeon: Mcarthur Rossetti, MD;  Location: [facility];  Service: Orthopedics;  Laterality: Left;  . LAPAROSCOPY    . left elbow surgery     . OTHER SURGICAL HISTORY     arthroswcopic surgery right knee   . OTHER SURGICAL HISTORY     arthroscopic surgery left knee x 2   . OTHER SURGICAL HISTORY     bunionectomy right foot   . OTHER SURGICAL HISTORY  C Section x 2   . OTHER SURGICAL  HISTORY     carpal tunnel on left   . RADIOLOGY WITH ANESTHESIA N/A 07/28/2019   Procedure: MRI WITH ANESTHESIA OF NECK SOFT TISSUE ONLY WITH AND WITHOUT CONTRAST;  Surgeon: Radiologist, Medication, MD;  Location: [facility];  Service: Radiology;  Laterality: N/A;  . right foot bunionectomy     . ROBOTIC ASSISTED TOTAL HYSTERECTOMY WITH BILATERAL SALPINGO OOPHERECTOMY Bilateral 06/16/2018   Procedure: XI ROBOTIC ASSISTED TOTAL HYSTERECTOMY WITH RIGHT SALPINGO OOPHORECTOMY;  Surgeon: Christophe Louis, MD;  Location: [facility];  Service: Gynecology;  Laterality: Bilateral;  . TOTAL HIP ARTHROPLASTY  05/02/2011   Procedure: TOTAL HIP ARTHROPLASTY ANTERIOR APPROACH;  Surgeon: Mcarthur Rossetti, MD;  Location: [facility];  Service: Orthopedics;  Laterality: Left;  Left Total Hip Arthroplasty, Direct Anterior Approach  . TOTAL KNEE ARTHROPLASTY Right 12/03/2012   Procedure: RIGHT TOTAL KNEE ARTHROPLASTY;  Surgeon: Mcarthur Rossetti, MD;  Location: [facility];  Service: Orthopedics;  Laterality: Right;  . TOTAL KNEE ARTHROPLASTY Left 12/15/2014   Procedure: LEFT TOTAL KNEE ARTHROPLASTY;  Surgeon: Mcarthur Rossetti, MD;  Location: [facility];  Service: Orthopedics;  Laterality: Left;  . TUBAL LIGATION    . ULNAR NERVE TRANSPOSITION     left   . UPPER GI ENDOSCOPY     x 2   Social History   Occupational History  . Not on file  Tobacco Use  . Smoking status: Current Every Day Smoker    Packs/day: 0.25    Years: 25.00    Pack years: 6.25    Types: Cigarettes  . Smokeless tobacco: Never Used  . Tobacco comment: Pt reports that she is in the process of quitting  Vaping Use  . Vaping Use: Never used  Substance and Sexual Activity  . Alcohol use: Yes    Comment: occasional beer   . Drug use: No    Comment: hx of 34 years ago marijuana   . Sexual activity: Not on file

## 2019-10-03 NOTE — Telephone Encounter (Signed)
Pt is coming today for H&P to see Dr. Louanne Skye before her MRI Sedation that is scheduled for tomorrow at [facility].

## 2019-10-03 NOTE — Anesthesia Preprocedure Evaluation (Addendum)
Anesthesia Evaluation  Patient identified by MRN, date of birth, ID band Patient awake    Reviewed: Allergy & Precautions, H&P , NPO status , Patient's Chart, lab work & pertinent test results  Airway Mallampati: III  TM Distance: >3 FB Neck ROM: Full    Dental no notable dental hx. (+) Teeth Intact, Dental Advisory Given   Pulmonary asthma , COPD, Current SmokerPatient did not abstain from smoking.,    Pulmonary exam normal breath sounds clear to auscultation       Cardiovascular Exercise Tolerance: Good + dysrhythmias  Rhythm:Regular Rate:Normal     Neuro/Psych  Headaches, Anxiety Depression    GI/Hepatic Neg liver ROS, GERD  ,  Endo/Other  negative endocrine ROS  Renal/GU negative Renal ROS  negative genitourinary   Musculoskeletal  (+) Arthritis , Osteoarthritis,    Abdominal   Peds  Hematology negative hematology ROS (+)   Anesthesia Other Findings   Reproductive/Obstetrics negative OB ROS                            Anesthesia Physical Anesthesia Plan  ASA: II  Anesthesia Plan: General   Post-op Pain Management:    Induction: Intravenous  PONV Risk Score and Plan: 2 and Ondansetron, Dexamethasone and Midazolam  Airway Management Planned: LMA  Additional Equipment:   Intra-op Plan:   Post-operative Plan: Extubation in OR  Informed Consent: I have reviewed the patients History and Physical, chart, labs and discussed the procedure including the risks, benefits and alternatives for the proposed anesthesia with the patient or authorized representative who has indicated his/her understanding and acceptance.     Dental advisory given  Plan Discussed with: CRNA  Anesthesia Plan Comments: (PAT note written 10/03/2019 by Myra Gianotti, PA-C. )       Anesthesia Quick Evaluation

## 2019-10-03 NOTE — Patient Instructions (Signed)
Plan: Avoid overhead lifting and overhead use of the arms. Do not lift greater than 5 lbs. Adjust head rest in vehicle to prevent hyperextension if rear ended. Take extra precautions to avoid falling. MRI of the cervical spine is ordered.  Today a history and physical was done as a prerequisite to having a general anesthesia for MRI of the cervical spine.

## 2019-10-04 ENCOUNTER — Ambulatory Visit (HOSPITAL_COMMUNITY): Payer: Medicare Other | Admitting: Vascular Surgery

## 2019-10-04 ENCOUNTER — Ambulatory Visit (HOSPITAL_COMMUNITY)
Admission: RE | Admit: 2019-10-04 | Discharge: 2019-10-04 | Disposition: A | Discharge status: Home / Self Care | Payer: Medicare Other | Source: Ambulatory Visit | Attending: Specialist | Admitting: Specialist

## 2019-10-04 ENCOUNTER — Encounter (HOSPITAL_COMMUNITY): Payer: Self-pay

## 2019-10-04 ENCOUNTER — Ambulatory Visit (HOSPITAL_COMMUNITY)
Admission: RE | Admit: 2019-10-04 | Discharge: 2019-10-04 | Disposition: A | Discharge status: Home / Self Care | Payer: Medicare Other | Attending: Specialist | Admitting: Specialist

## 2019-10-04 ENCOUNTER — Encounter (HOSPITAL_COMMUNITY): Admission: RE | Disposition: A | Discharge status: Home / Self Care | Payer: Self-pay | Source: Home / Self Care

## 2019-10-04 DIAGNOSIS — M50122 Cervical disc disorder at C5-C6 level with radiculopathy: Secondary | ICD-10-CM | POA: Diagnosis not present

## 2019-10-04 DIAGNOSIS — M5030 Other cervical disc degeneration, unspecified cervical region: Secondary | ICD-10-CM | POA: Diagnosis not present

## 2019-10-04 DIAGNOSIS — J449 Chronic obstructive pulmonary disease, unspecified: Secondary | ICD-10-CM | POA: Diagnosis not present

## 2019-10-04 DIAGNOSIS — I447 Left bundle-branch block, unspecified: Secondary | ICD-10-CM | POA: Insufficient documentation

## 2019-10-04 DIAGNOSIS — Z96653 Presence of artificial knee joint, bilateral: Secondary | ICD-10-CM | POA: Diagnosis not present

## 2019-10-04 DIAGNOSIS — M50121 Cervical disc disorder at C4-C5 level with radiculopathy: Secondary | ICD-10-CM | POA: Diagnosis not present

## 2019-10-04 DIAGNOSIS — M5412 Radiculopathy, cervical region: Secondary | ICD-10-CM

## 2019-10-04 DIAGNOSIS — K219 Gastro-esophageal reflux disease without esophagitis: Secondary | ICD-10-CM | POA: Diagnosis not present

## 2019-10-04 DIAGNOSIS — M542 Cervicalgia: Secondary | ICD-10-CM | POA: Insufficient documentation

## 2019-10-04 DIAGNOSIS — M50123 Cervical disc disorder at C6-C7 level with radiculopathy: Secondary | ICD-10-CM | POA: Diagnosis not present

## 2019-10-04 DIAGNOSIS — F329 Major depressive disorder, single episode, unspecified: Secondary | ICD-10-CM | POA: Insufficient documentation

## 2019-10-04 DIAGNOSIS — M25511 Pain in right shoulder: Secondary | ICD-10-CM | POA: Insufficient documentation

## 2019-10-04 DIAGNOSIS — F418 Other specified anxiety disorders: Secondary | ICD-10-CM | POA: Diagnosis not present

## 2019-10-04 DIAGNOSIS — F17210 Nicotine dependence, cigarettes, uncomplicated: Secondary | ICD-10-CM | POA: Diagnosis not present

## 2019-10-04 DIAGNOSIS — M19011 Primary osteoarthritis, right shoulder: Secondary | ICD-10-CM | POA: Insufficient documentation

## 2019-10-04 DIAGNOSIS — M4722 Other spondylosis with radiculopathy, cervical region: Secondary | ICD-10-CM | POA: Diagnosis not present

## 2019-10-04 DIAGNOSIS — F419 Anxiety disorder, unspecified: Secondary | ICD-10-CM | POA: Diagnosis not present

## 2019-10-04 HISTORY — PX: RADIOLOGY WITH ANESTHESIA: SHX6223

## 2019-10-04 LAB — POCT I-STAT, CHEM 8
BUN: 12 mg/dL (ref 8–23)
Calcium, Ion: 1.16 mmol/L (ref 1.15–1.40)
Chloride: 103 mmol/L (ref 98–111)
Creatinine, Ser: 0.8 mg/dL (ref 0.44–1.00)
Glucose, Bld: 100 mg/dL — ABNORMAL HIGH (ref 70–99)
HCT: 42 % (ref 36.0–46.0)
Hemoglobin: 14.3 g/dL (ref 12.0–15.0)
Potassium: 3.8 mmol/L (ref 3.5–5.1)
Sodium: 140 mmol/L (ref 135–145)
TCO2: 24 mmol/L (ref 22–32)

## 2019-10-04 LAB — BASIC METABOLIC PANEL
Anion gap: 11 (ref 5–15)
BUN: 11 mg/dL (ref 8–23)
CO2: 23 mmol/L (ref 22–32)
Calcium: 9 mg/dL (ref 8.9–10.3)
Chloride: 105 mmol/L (ref 98–111)
Creatinine, Ser: 0.9 mg/dL (ref 0.44–1.00)
GFR calc Af Amer: >60 mL/min (ref >60)
GFR calc non Af Amer: >60 mL/min (ref >60)
Glucose, Bld: 97 mg/dL (ref 70–99)
Potassium: 3.8 mmol/L (ref 3.5–5.1)
Sodium: 139 mmol/L (ref 135–145)

## 2019-10-04 SURGERY — MRI WITH ANESTHESIA
Anesthesia: General

## 2019-10-04 MED ORDER — PHENYLEPHRINE 40 MCG/ML (10ML) SYRINGE FOR IV PUSH (FOR BLOOD PRESSURE SUPPORT)
PREFILLED_SYRINGE | INTRAVENOUS | Status: DC | PRN
  Administered 2019-10-04 (×4): 80 ug via INTRAVENOUS

## 2019-10-04 MED ORDER — LIDOCAINE 2% (20 MG/ML) 5 ML SYRINGE
INTRAMUSCULAR | Status: DC | PRN
  Administered 2019-10-04: 60 mg via INTRAVENOUS

## 2019-10-04 MED ORDER — PROPOFOL 10 MG/ML IV BOLUS
INTRAVENOUS | Status: DC | PRN
  Administered 2019-10-04: 50 mg via INTRAVENOUS
  Administered 2019-10-04: 150 mg via INTRAVENOUS

## 2019-10-04 MED ORDER — ONDANSETRON HCL 4 MG/2ML IJ SOLN
INTRAMUSCULAR | Status: DC | PRN
  Administered 2019-10-04: 4 mg via INTRAVENOUS

## 2019-10-04 MED ORDER — ACETAMINOPHEN 500 MG PO TABS: 1000.0000 mg | ORAL_TABLET | Freq: Once | ORAL | Status: AC

## 2019-10-04 MED ORDER — FENTANYL CITRATE (PF) 100 MCG/2ML IJ SOLN: 25.0000 ug | INTRAMUSCULAR | Status: DC | PRN

## 2019-10-04 MED ORDER — ACETAMINOPHEN 500 MG PO TABS
ORAL_TABLET | ORAL | Status: AC
  Administered 2019-10-04: 1000 mg via ORAL
  Filled 2019-10-04: qty 2

## 2019-10-04 MED ORDER — LACTATED RINGERS IV SOLN: INTRAVENOUS | Status: DC

## 2019-10-04 MED ORDER — MIDAZOLAM HCL 5 MG/5ML IJ SOLN
INTRAMUSCULAR | Status: DC | PRN
  Administered 2019-10-04 (×2): 1 mg via INTRAVENOUS

## 2019-10-04 NOTE — Anesthesia Postprocedure Evaluation (Signed)
Anesthesia Post Note  Patient: Katrina [name]  Procedure(s) Performed: MRI WITH ANESTHESIA RIGHT SHOULDER WITHOUT CONTRAST,MRI OF CERVICAL SPINE WITHOUT CONTRAST (N/A )     Patient location during evaluation: PACU Anesthesia Type: General Level of consciousness: awake and alert Pain management: pain level controlled Vital Signs Assessment: post-procedure vital signs reviewed and stable Respiratory status: spontaneous breathing, nonlabored ventilation and respiratory function stable Cardiovascular status: blood pressure returned to baseline and stable Postop Assessment: no apparent nausea or vomiting Anesthetic complications: no   No complications documented.  Last Vitals:  Vitals:   10/04/19 1000 10/04/19 1007  BP:  (!) 152/70  Pulse: 98 96  Resp: 12 20  Temp:  (!) 36.3 C  SpO2: 94% 94%    Last Pain:  Vitals:   10/04/19 0957  PainSc: 3                  Humphrey Guerreiro,W. EDMOND

## 2019-10-04 NOTE — Transfer of Care (Signed)
Immediate Anesthesia Transfer of Care Note  Patient: Katrina [name]  Procedure(s) Performed: MRI WITH ANESTHESIA RIGHT SHOULDER WITHOUT CONTRAST,MRI OF CERVICAL SPINE WITHOUT CONTRAST (N/A )  Patient Location: PACU  Anesthesia Type:General  Level of Consciousness: awake, alert , oriented, patient cooperative and responds to stimulation  Airway & Oxygen Therapy: Patient Spontanous Breathing  Post-op Assessment: Report given to RN and Post -op Vital signs reviewed and stable  Post vital signs: Reviewed and stable  Last Vitals:  Vitals Value Taken Time  BP 157/93 10/04/19 0957  Temp 36.3 C 10/04/19 0957  Pulse 98 10/04/19 1003  Resp 13 10/04/19 1003  SpO2 95 % 10/04/19 1003  Vitals shown include unvalidated device data.  Last Pain:  Vitals:   10/04/19 0957  PainSc: 3          Complications: No complications documented.

## 2019-10-05 ENCOUNTER — Encounter (HOSPITAL_COMMUNITY): Payer: Self-pay | Admitting: Radiology

## 2019-10-06 DIAGNOSIS — Z01419 Encounter for gynecological examination (general) (routine) without abnormal findings: Secondary | ICD-10-CM | POA: Diagnosis not present

## 2019-10-14 ENCOUNTER — Other Ambulatory Visit: Payer: Self-pay

## 2019-10-14 ENCOUNTER — Encounter: Payer: Self-pay | Admitting: Specialist

## 2019-10-14 ENCOUNTER — Ambulatory Visit (INDEPENDENT_AMBULATORY_CARE_PROVIDER_SITE_OTHER): Payer: Medicare Other | Admitting: Specialist

## 2019-10-14 VITALS — BP 142/87 | HR 86 | Ht 67.0 in | Wt 216.0 lb

## 2019-10-14 DIAGNOSIS — G5621 Lesion of ulnar nerve, right upper limb: Secondary | ICD-10-CM | POA: Diagnosis not present

## 2019-10-14 DIAGNOSIS — M542 Cervicalgia: Secondary | ICD-10-CM

## 2019-10-14 DIAGNOSIS — M5412 Radiculopathy, cervical region: Secondary | ICD-10-CM | POA: Diagnosis not present

## 2019-10-14 DIAGNOSIS — R222 Localized swelling, mass and lump, trunk: Secondary | ICD-10-CM

## 2019-10-14 DIAGNOSIS — R221 Localized swelling, mass and lump, neck: Secondary | ICD-10-CM

## 2019-10-14 NOTE — Progress Notes (Signed)
Office Visit Note   Patient: Katrina [name]           Date of Birth: [birth year]           MRN: [id number] Visit Date: 10/14/2019              Requested by: Shirline Frees, MD [address] [place],  [place] [address] PCP: Shirline Frees, MD   Assessment & Plan: Visit Diagnoses:  1. Mass of thoracic structure   2. Cervical radiculopathy   3. Neck mass   4. Ulnar neuropathy of right upper extremity   5. Neck pain     Plan:Avoid overhead lifting and overhead use of the arms. Do not lift greater than 5 lbs. Adjust head rest in vehicle to prevent hyperextension if rear ended. Take extra precautions to avoid falling. There is a mass in the right upper thorax above your right scapula but not at the level of the cervical spine that should be assessed. A CT Scan of the upper chest should allow a better assessment of this area than either the soft tissue MRI of the neck of the MRI of the Cervical spine done to assess for a cervical disc herniation or pinch nerve at the level of the cervical spine. It is possible that a lump in the Right upper chest can bring about right arm nerve symptoms and warrants the CT Scan.   Follow-Up Instructions: Return in about 2 weeks (around 10/28/2019).   Orders:  No orders of the defined types were placed in this encounter.  No orders of the defined types were placed in this encounter.     Procedures: No procedures performed   Clinical Data: No additional findings.   Subjective: Chief Complaint  Patient presents with  . Right Shoulder - Follow-up  . Neck - Follow-up    64 year old female right hand dominant post right tranposition of ulnar n. She is becoming more left handed. Smoker up to 1/2 pack per day. Painful right upper trapezial upper thoracic mass or swelling that is painful to palpation.  MRI of cervical spine read as negative for lower cervical findings.    Review of Systems  Constitutional: Negative.     HENT: Negative.   Eyes: Negative.   Respiratory: Negative.   Cardiovascular: Negative.   Gastrointestinal: Negative.   Endocrine: Negative.   Genitourinary: Negative.   Musculoskeletal: Positive for back pain, neck pain and neck stiffness.  Skin: Negative.   Allergic/Immunologic: Negative.   Neurological: Positive for weakness, numbness and headaches.  Hematological: Negative.   Psychiatric/Behavioral: Negative.      Objective: Vital Signs: BP (!) 142/87 (BP Location: Left Arm, Patient Position: Sitting)   Pulse 86   Ht 5\' 7"  (1.702 m)   Wt 216 lb (98 kg)   BMI 33.83 kg/m   Physical Exam Constitutional:      Appearance: She is well-developed.  HENT:     Head: Normocephalic and atraumatic.  Eyes:     Pupils: Pupils are equal, round, and reactive to light.  Pulmonary:     Effort: Pulmonary effort is normal.     Breath sounds: Normal breath sounds.  Abdominal:     General: Bowel sounds are normal.     Palpations: Abdomen is soft.  Musculoskeletal:     Cervical back: Normal range of motion and neck supple.     Lumbar back: Negative right straight leg raise test and negative left straight leg raise test.  Skin:  General: Skin is warm and dry.  Neurological:     Mental Status: She is alert and oriented to person, place, and time.  Psychiatric:        Behavior: Behavior normal.        Thought Content: Thought content normal.        Judgment: Judgment normal.     Back Exam   Tenderness  The patient is experiencing tenderness in the cervical.  Range of Motion  Extension: abnormal  Flexion: abnormal  Lateral bend right: abnormal  Lateral bend left: abnormal  Rotation right: abnormal  Rotation left: abnormal   Muscle Strength  Right Quadriceps:  5/5  Left Quadriceps:  5/5  Right Hamstrings:  5/5  Left Hamstrings:  5/5   Tests  Straight leg raise right: negative Straight leg raise left: negative  Reflexes  Patellar: 2/4 Achilles: 2/4 Biceps:  2/4 Babinski's sign: normal   Other  Toe walk: normal Heel walk: normal Sensation: normal Erythema: no back redness Scars: absent  Comments:  Right upper thorax above the scapula medial 1/3 with swelling and tenderness. Both the soft tissue MRI and the cervical MRI are not adequately assessing this area.       Specialty Comments:  No specialty comments available.  Imaging: No results found.   PMFS History: Patient Active Problem List   Diagnosis Date Noted  . Postmenopausal bleeding 06/16/2018  . S/P hysterectomy 06/16/2018  . Chronic right shoulder pain 10/28/2016  . Impingement syndrome of right shoulder 10/28/2016  . Osteoarthritis of left knee 12/15/2014  . Status post total left knee replacement 12/15/2014  . Hand pain, left 04/21/2014  . Cat bite of finger 04/21/2014  . Asthma 04/21/2014  . Tobacco abuse 04/21/2014  . COPD (chronic obstructive pulmonary disease) ([facility])   . Effusion of left knee joint 11/03/2013  . Arthritis of right knee 12/03/2012  . Degenerative arthritis of hip 05/02/2011   Past Medical History:  Diagnosis Date  . Anxiety   . Arthritis    knees, hips, elbows   . Asthma    hx of asthma 3/12- hospitalized   . COPD (chronic obstructive pulmonary disease) ([facility])   . Depression   . GERD (gastroesophageal reflux disease)    PRIOR TO HAVING GALLBLADDER REMOVED  . Headache   . Left bundle branch block 06/28/2010  . Lung infection 2015  . Lung infection   . volvulus     previous history of twisted intestines    Family History  Problem Relation Age of Onset  . Heart disease Mother     Past Surgical History:  Procedure Laterality Date  . BACK SURGERY     x2  . CARPAL TUNNEL RELEASE     left   . CESAREAN SECTION     x 2  . CHOLECYSTECTOMY    . COLONOSCOPY  10/2017  . DILATATION & CURETTAGE/HYSTEROSCOPY WITH MYOSURE N/A 12/10/2017   Procedure: DILATATION & CURETTAGE/HYSTEROSCOPY WITH MYOSURE;  Surgeon: Christophe Louis, MD;  Location: [facility];  Service: Gynecology;  Laterality: N/A;  POSSIBLE MYOMECTOMY VS POLYPECTOMY WITH MYOSURE  . DILATION AND CURETTAGE OF UTERUS    . ELBOW SURGERY    . JOINT REPLACEMENT    . KNEE ARTHROSCOPY     bilateral x 2   . KNEE ARTHROSCOPY Left 11/03/2013   Procedure: LEFT KNEE ARTHROSCOPY WITH DEBRIDEMENT, PARTIAL SYNOVECTOMY;  Surgeon: Mcarthur Rossetti, MD;  [facility];  Service: Orthopedics;  Laterality: Left;  . LAPAROSCOPY    .  left elbow surgery     . OTHER SURGICAL HISTORY     arthroswcopic surgery right knee   . OTHER SURGICAL HISTORY     arthroscopic surgery left knee x 2   . OTHER SURGICAL HISTORY     bunionectomy right foot   . OTHER SURGICAL HISTORY     C Section x 2   . OTHER SURGICAL HISTORY     carpal tunnel on left   . RADIOLOGY WITH ANESTHESIA N/A 07/28/2019   Procedure: MRI WITH ANESTHESIA OF NECK SOFT TISSUE ONLY WITH AND WITHOUT CONTRAST;  Surgeon: Radiologist, Medication, MD;  Location: [facility];  Service: Radiology;  Laterality: N/A;  . RADIOLOGY WITH ANESTHESIA N/A 10/04/2019   Procedure: MRI WITH ANESTHESIA RIGHT SHOULDER WITHOUT CONTRAST,MRI OF CERVICAL SPINE WITHOUT CONTRAST;  Surgeon: Radiologist, Medication, MD;  Location: [facility];  Service: Radiology;  Laterality: N/A;  . right foot bunionectomy     . ROBOTIC ASSISTED TOTAL HYSTERECTOMY WITH BILATERAL SALPINGO OOPHERECTOMY Bilateral 06/16/2018   Procedure: XI ROBOTIC ASSISTED TOTAL HYSTERECTOMY WITH RIGHT SALPINGO OOPHORECTOMY;  Surgeon: Christophe Louis, MD;  [facility];  Service: Gynecology;  Laterality: Bilateral;  . TOTAL HIP ARTHROPLASTY  05/02/2011   Procedure: TOTAL HIP ARTHROPLASTY ANTERIOR APPROACH;  Surgeon: Mcarthur Rossetti, MD;  [facility];  Service: Orthopedics;  Laterality: Left;  Left Total Hip Arthroplasty, Direct Anterior Approach  . TOTAL KNEE ARTHROPLASTY Right 12/03/2012   Procedure: RIGHT TOTAL KNEE ARTHROPLASTY;  Surgeon: Mcarthur Rossetti, MD;  Location:  [facility];  Service: Orthopedics;  Laterality: Right;  . TOTAL KNEE ARTHROPLASTY Left 12/15/2014   Procedure: LEFT TOTAL KNEE ARTHROPLASTY;  Surgeon: Mcarthur Rossetti, MD;  [facility];  Service: Orthopedics;  Laterality: Left;  . TUBAL LIGATION    . ULNAR NERVE TRANSPOSITION     left   . UPPER GI ENDOSCOPY     x 2   Social History   Occupational History  . Not on file  Tobacco Use  . Smoking status: Current Every Day Smoker    Packs/day: 0.25    Years: 25.00    Pack years: 6.25    Types: Cigarettes  . Smokeless tobacco: Never Used  . Tobacco comment: Pt reports that she is in the process of quitting  Vaping Use  . Vaping Use: Never used  Substance and Sexual Activity  . Alcohol use: Yes    Comment: occasional beer   . Drug use: No    Comment: hx of 34 years ago marijuana   . Sexual activity: Not on file

## 2019-10-14 NOTE — Patient Instructions (Signed)
Avoid overhead lifting and overhead use of the arms. Do not lift greater than 5 lbs. Adjust head rest in vehicle to prevent hyperextension if rear ended. Take extra precautions to avoid falling. There is a mass in the right upper thorax above your right scapula but not at the level of the cervical spine that should be assessed. A CT Scan of the upper chest should allow a better assessment of this area than either the soft tissue MRI of the neck of the MRI of the Cervical spine done to assess for a cervical disc herniation or pinch nerve at the level of the cervical spine. It is possible that a lump in the Right upper chest can bring about right arm nerve symptoms and warrants the CT Scan.

## 2019-10-18 ENCOUNTER — Telehealth (HOSPITAL_COMMUNITY): Payer: Self-pay

## 2019-10-18 NOTE — H&P (Signed)
Office Visit Note              Patient: Katrina [name]                                         Date of Birth: 09-24-1955                                                  MRN: [id number] Visit Date: 10/03/2019                                                                     Requested by: Shirline Frees, MD [address] [place],  [place] [id number] PCP: Shirline Frees, MD   Assessment & Plan: Visit Diagnoses:  1. Other spondylosis with radiculopathy, cervical region   2. Ulnar neuropathy of right upper extremity   3. Neck mass     Plan: Avoid overhead lifting and overhead use of the arms. Do not lift greater than 5 lbs. Adjust head rest in vehicle to prevent hyperextension if rear ended. Take extra precautions to avoid falling. MRI of the cervical spine is ordered.  Today a history and physical was done as a prerequisite to having a general anesthesia for MRI of the cervical spine.    Follow-Up Instructions: No follow-ups on file.   Orders:  No orders of the defined types were placed in this encounter.  No orders of the defined types were placed in this encounter.     Procedures: No procedures performed   Clinical Data: No additional findings.   Subjective:    Chief Complaint  Patient presents with  . H&P    64 year old female with neck pain and radiation into the right arm. She has had a transposition of the right ulnar nerve at the elbow.  She is claustrophobic and she has difficulty getting through MRIs. She is seen today for a preprocedure H&P. She is having stiffness int The neck with persistent pain radiating into the right radial 2 fingers    PREProcedure  H&P  Chief Complaint:Cervicalgia with right cervical radiculopathy  HPI: Katrina [name] is a 64 y.o. female who presents for preoperative history and physical with a diagnosis of Cervicalgia with right cervical radiculopathy.  Symptoms are rated as  moderate to severe, and have been worsening.  This is significantly impairing activities of daily living.  She has elected for surgical management.   Past Medical History: No date: Anxiety No date: Arthritis     Comment:  knees, hips, elbows  No date: Asthma     Comment:  hx of asthma 3/12- hospitalized  No date: COPD (chronic obstructive pulmonary disease) ([facility]) No date: Depression No date: GERD (gastroesophageal reflux disease)     Comment:  PRIOR TO HAVING GALLBLADDER REMOVED No date: Headache 06/28/2010: Left bundle branch block 2015: Lung infection No date: Lung infection No date: volvulus     Comment:   previous history of twisted intestines Past Surgical History: No date:  BACK SURGERY     Comment:  x2 No date: CARPAL TUNNEL RELEASE     Comment:  left  No date: CESAREAN SECTION     Comment:  x 2 No date: CHOLECYSTECTOMY 10/2017: COLONOSCOPY 12/10/2017: DILATATION & CURETTAGE/HYSTEROSCOPY WITH MYOSURE; N/A     Comment:  Procedure: DILATATION & CURETTAGE/HYSTEROSCOPY WITH               MYOSURE;  Surgeon: Christophe Louis, MD;  Location: [facility];                Service: Gynecology;  Laterality: N/A;  POSSIBLE               MYOMECTOMY VS POLYPECTOMY WITH MYOSURE No date: DILATION AND CURETTAGE OF UTERUS No date: ELBOW SURGERY No date: JOINT REPLACEMENT No date: KNEE ARTHROSCOPY     Comment:  bilateral x 2  11/03/2013: KNEE ARTHROSCOPY; Left     Comment:  Procedure: LEFT KNEE ARTHROSCOPY WITH DEBRIDEMENT,               PARTIAL SYNOVECTOMY;  Surgeon: Mcarthur Rossetti,               MD;  [facility];  Service: Orthopedics;                Laterality: Left; No date: LAPAROSCOPY No date: left elbow surgery  No date: OTHER SURGICAL HISTORY     Comment:  arthroswcopic surgery right knee  No date: OTHER SURGICAL HISTORY     Comment:  arthroscopic surgery left knee x 2  No date: OTHER SURGICAL HISTORY     Comment:  bunionectomy right foot  No date: OTHER SURGICAL HISTORY      Comment:  C Section x 2  No date: OTHER SURGICAL HISTORY     Comment:  carpal tunnel on left  07/28/2019: RADIOLOGY WITH ANESTHESIA; N/A     Comment:  Procedure: MRI WITH ANESTHESIA OF NECK SOFT TISSUE ONLY               WITH AND WITHOUT CONTRAST;  Surgeon: Radiologist,               Medication, MD;  Location: [facility];  Service: Radiology;                Laterality: N/A; No date: right foot bunionectomy  06/16/2018: ROBOTIC ASSISTED TOTAL HYSTERECTOMY WITH BILATERAL  SALPINGO OOPHERECTOMY; Bilateral     Comment:  Procedure: XI ROBOTIC ASSISTED TOTAL HYSTERECTOMY WITH               RIGHT SALPINGO OOPHORECTOMY;  Surgeon: Christophe Louis, MD;                Location: [facility];  Service:               Gynecology;  Laterality: Bilateral; 05/02/2011: TOTAL HIP ARTHROPLASTY     Comment:  Procedure: TOTAL HIP ARTHROPLASTY ANTERIOR APPROACH;                Surgeon: Mcarthur Rossetti, MD;  [facility];                Service: Orthopedics;  Laterality: Left;  Left Total Hip               Arthroplasty, Direct Anterior Approach 12/03/2012: TOTAL KNEE ARTHROPLASTY; Right     Comment:  Procedure: RIGHT TOTAL KNEE ARTHROPLASTY;  Surgeon:  Mcarthur Rossetti, MD;  [facility];  Service:               Orthopedics;  Laterality: Right; 12/15/2014: TOTAL KNEE ARTHROPLASTY; Left     Comment:  Procedure: LEFT TOTAL KNEE ARTHROPLASTY;  Surgeon:               Mcarthur Rossetti, MD;  [facility];  Service:               Orthopedics;  Laterality: Left; No date: TUBAL LIGATION No date: ULNAR NERVE TRANSPOSITION     Comment:  left  No date: UPPER GI ENDOSCOPY     Comment:  x 2 Social History   Socioeconomic History     Marital status: Married     Spouse name: Not on file     Number of children: Not on file     Years of education: Not on file     Highest education level: Not on file   Occupational History     Not on file   Tobacco Use     Smoking status:  Current Every Day Smoker       Packs/day: 0.25       Years: 25.00       Pack years: 6.25       Types: Cigarettes     Smokeless tobacco: Never Used     Tobacco comment: Pt reports that she is in the process of quitting   Vaping Use     Vaping Use: Never used   Substance and Sexual Activity     Alcohol use: Yes       Comment: occasional beer      Drug use: No       Comment: hx of 34 years ago marijuana      Sexual activity: Not on file   Other Topics     Concerns:       Not on file   Social History Narrative     Not on file  Social Determinants of Health Financial Resource Strain:    Difficulty of Paying Living Expenses:  Food Insecurity:    Worried About Charity fundraiser in the Last Year:    Arboriculturist in the Last Year:  Transportation Needs:    Film/video editor (Medical):    Lack of Transportation (Non-Medical):  Physical Activity:    Days of Exercise per Week:    Minutes of Exercise per Session:  Stress:    Feeling of Stress :  Social Connections:    Frequency of Communication with Friends and Family:    Frequency of Social Gatherings with Friends and Family:    Attends Religious Services:    Active Member of Clubs or Organizations:    Attends Music therapist:    Marital Status:  Review of patient's family history indicates: Problem: Heart disease     Relation: Mother         Age of Onset: (Not Specified)   -- Codeine -- Itching and Nausea Only Prior to Admission medications : Medication acetaminophen (TYLENOL) 325 MG tablet, Sig Take 2 tablets (650 mg total) by mouth every 4 (four) hours as needed for mild pain (temperature > 101.5.).Patient taking differently: Take 650 mg by mouth every 4 (four) hours as needed for mild pain. , Start Date 06/17/18, End Date , Taking? Yes, Authorizing Provider Christophe Louis, MD  Medication clonazePAM (KLONOPIN) 0.5 MG tablet, Sig Take 1 tablet (0.5  mg total) by mouth 2 (two)  times daily as needed for anxiety., Start Date 08/24/19, End Date , Taking? Yes, Authorizing Provider Jessy Oto, MD     Positive ROS: All other systems have been reviewed and were otherwise negative with the exception of those mentioned in the HPI and as above.  Physical Exam: General: Alert, no acute distress Cardiovascular: No pedal edema Respiratory: No cyanosis, no use of accessory musculature GI: No organomegaly, abdomen is soft and non-tender Skin: No lesions in the area of chief complaint Neurologic: Sensation intact distally Psychiatric: Patient is competent for consent with normal mood and affect Lymphatic: No axillary or cervical lymphadenopathy  MUSCULOSKELETAL: Right arm tricep weakness and right arm numbness index and thumb. Painful ROM cervical spine with positive spurling sign. Complaints of swelling right  Supraclavicular area.   Assessment: Cervical radiculopathy C6 right. History of previous right ulnar n transposition at the elbow.   Plan: Plan for MRI of the cervical spine  Under a general anesthesia due to claustrophobia.  The risks benefits and alternatives were discussed with the patient including but not limited to the risks of nonoperative treatment, versus surgical intervention including infection, bleeding, nerve injury,  blood clots, cardiopulmonary complications, morbidity, mortality, among others, and they were willing to proceed.   Basil Dess, MD Cell [phone] Office [phone] 10/04/2019 7:39 AM     Review of Systems   Objective: Vital Signs: BP 135/83   Pulse 99   Temp 98 F (36.7 C)   Ht 5\' 7"  (1.702 m)   Wt 215 lb 12.8 oz (97.9 kg)   BMI 33.80 kg/m   Physical Exam  Ortho Exam  Specialty Comments:  No specialty comments available.  Imaging: No results found.   PMFS History:     Patient Active Problem List   Diagnosis Date Noted  . Postmenopausal bleeding 06/16/2018  . S/P  hysterectomy 06/16/2018  . Chronic right shoulder pain 10/28/2016  . Impingement syndrome of right shoulder 10/28/2016  . Osteoarthritis of left knee 12/15/2014  . Status post total left knee replacement 12/15/2014  . Hand pain, left 04/21/2014  . Cat bite of finger 04/21/2014  . Asthma 04/21/2014  . Tobacco abuse 04/21/2014  . COPD (chronic obstructive pulmonary disease) ([facility])   . Effusion of left knee joint 11/03/2013  . Arthritis of right knee 12/03/2012  . Degenerative arthritis of hip 05/02/2011       Past Medical History:  Diagnosis Date  . Anxiety   . Arthritis    knees, hips, elbows   . Asthma    hx of asthma 3/12- hospitalized   . COPD (chronic obstructive pulmonary disease) ([facility])   . Depression   . GERD (gastroesophageal reflux disease)    PRIOR TO HAVING GALLBLADDER REMOVED  . Headache   . Left bundle branch block 06/28/2010  . Lung infection 2015  . Lung infection   . volvulus     previous history of twisted intestines         Family History  Problem Relation Age of Onset  . Heart disease Mother          Past Surgical History:  Procedure Laterality Date  . BACK SURGERY     x2  . CARPAL TUNNEL RELEASE     left   . CESAREAN SECTION     x 2  . CHOLECYSTECTOMY    . COLONOSCOPY  10/2017  . DILATATION & CURETTAGE/HYSTEROSCOPY WITH MYOSURE N/A 12/10/2017   Procedure: DILATATION & CURETTAGE/HYSTEROSCOPY  WITH MYOSURE;  Surgeon: Christophe Louis, MD;  Location: [facility];  Service: Gynecology;  Laterality: N/A;  POSSIBLE MYOMECTOMY VS POLYPECTOMY WITH MYOSURE  . DILATION AND CURETTAGE OF UTERUS    . ELBOW SURGERY    . JOINT REPLACEMENT    . KNEE ARTHROSCOPY     bilateral x 2   . KNEE ARTHROSCOPY Left 11/03/2013   Procedure: LEFT KNEE ARTHROSCOPY WITH DEBRIDEMENT, PARTIAL SYNOVECTOMY;  Surgeon: Mcarthur Rossetti, MD;  [facility];  Service: Orthopedics;  Laterality: Left;  . LAPAROSCOPY    . left elbow surgery     .  OTHER SURGICAL HISTORY     arthroswcopic surgery right knee   . OTHER SURGICAL HISTORY     arthroscopic surgery left knee x 2   . OTHER SURGICAL HISTORY     bunionectomy right foot   . OTHER SURGICAL HISTORY     C Section x 2   . OTHER SURGICAL HISTORY     carpal tunnel on left   . RADIOLOGY WITH ANESTHESIA N/A 07/28/2019   Procedure: MRI WITH ANESTHESIA OF NECK SOFT TISSUE ONLY WITH AND WITHOUT CONTRAST;  Surgeon: Radiologist, Medication, MD;  Location: [facility];  Service: Radiology;  Laterality: N/A;  . right foot bunionectomy     . ROBOTIC ASSISTED TOTAL HYSTERECTOMY WITH BILATERAL SALPINGO OOPHERECTOMY Bilateral 06/16/2018   Procedure: XI ROBOTIC ASSISTED TOTAL HYSTERECTOMY WITH RIGHT SALPINGO OOPHORECTOMY;  Surgeon: Christophe Louis, MD;  Location: [facility];  Service: Gynecology;  Laterality: Bilateral;  . TOTAL HIP ARTHROPLASTY  05/02/2011   Procedure: TOTAL HIP ARTHROPLASTY ANTERIOR APPROACH;  Surgeon: Mcarthur Rossetti, MD;  [facility];  Service: Orthopedics;  Laterality: Left;  Left Total Hip Arthroplasty, Direct Anterior Approach  . TOTAL KNEE ARTHROPLASTY Right 12/03/2012   Procedure: RIGHT TOTAL KNEE ARTHROPLASTY;  Surgeon: Mcarthur Rossetti, MD;  [facility];  Service: Orthopedics;  Laterality: Right;  . TOTAL KNEE ARTHROPLASTY Left 12/15/2014   Procedure: LEFT TOTAL KNEE ARTHROPLASTY;  Surgeon: Mcarthur Rossetti, MD;  [facility];  Service: Orthopedics;  Laterality: Left;  . TUBAL LIGATION    . ULNAR NERVE TRANSPOSITION     left   . UPPER GI ENDOSCOPY     x 2   Social History        Occupational History  . Not on file  Tobacco Use  . Smoking status: Current Every Day Smoker    Packs/day: 0.25    Years: 25.00    Pack years: 6.25    Types: Cigarettes  . Smokeless tobacco: Never Used  . Tobacco comment: Pt reports that she is in the process of quitting  Vaping Use  . Vaping Use: Never used    Substance and Sexual Activity  . Alcohol use: Yes    Comment: occasional beer   . Drug use: No    Comment: hx of 34 years ago marijuana   . Sexual activity: Not on file          Revision History

## 2019-10-26 ENCOUNTER — Ambulatory Visit
Admission: RE | Admit: 2019-10-26 | Discharge: 2019-10-26 | Disposition: A | Discharge status: Home / Self Care | Payer: Medicare Other | Source: Ambulatory Visit | Attending: Specialist | Admitting: Specialist

## 2019-10-26 DIAGNOSIS — R222 Localized swelling, mass and lump, trunk: Secondary | ICD-10-CM

## 2019-10-26 DIAGNOSIS — G5621 Lesion of ulnar nerve, right upper limb: Secondary | ICD-10-CM

## 2019-10-26 DIAGNOSIS — J439 Emphysema, unspecified: Secondary | ICD-10-CM | POA: Diagnosis not present

## 2019-10-26 DIAGNOSIS — J40 Bronchitis, not specified as acute or chronic: Secondary | ICD-10-CM | POA: Diagnosis not present

## 2019-10-26 DIAGNOSIS — M5412 Radiculopathy, cervical region: Secondary | ICD-10-CM

## 2019-10-26 DIAGNOSIS — R221 Localized swelling, mass and lump, neck: Secondary | ICD-10-CM

## 2019-10-26 DIAGNOSIS — I2510 Atherosclerotic heart disease of native coronary artery without angina pectoris: Secondary | ICD-10-CM | POA: Diagnosis not present

## 2019-10-26 DIAGNOSIS — I700 Atherosclerosis of aorta: Secondary | ICD-10-CM | POA: Diagnosis not present

## 2019-10-26 DIAGNOSIS — M542 Cervicalgia: Secondary | ICD-10-CM

## 2019-10-26 MED ORDER — IOPAMIDOL (ISOVUE-300) INJECTION 61%
75.0000 mL | Freq: Once | INTRAVENOUS | Status: AC | PRN
  Administered 2019-10-26: 75 mL via INTRAVENOUS

## 2019-11-09 ENCOUNTER — Ambulatory Visit (INDEPENDENT_AMBULATORY_CARE_PROVIDER_SITE_OTHER): Payer: Medicare Other | Admitting: Specialist

## 2019-11-09 ENCOUNTER — Encounter: Payer: Self-pay | Admitting: Specialist

## 2019-11-09 ENCOUNTER — Other Ambulatory Visit: Payer: Self-pay

## 2019-11-09 VITALS — BP 185/99 | HR 97 | Ht 67.0 in | Wt 216.0 lb

## 2019-11-09 DIAGNOSIS — R222 Localized swelling, mass and lump, trunk: Secondary | ICD-10-CM | POA: Diagnosis not present

## 2019-11-09 DIAGNOSIS — J438 Other emphysema: Secondary | ICD-10-CM

## 2019-11-09 DIAGNOSIS — G5621 Lesion of ulnar nerve, right upper limb: Secondary | ICD-10-CM

## 2019-11-09 DIAGNOSIS — M4722 Other spondylosis with radiculopathy, cervical region: Secondary | ICD-10-CM | POA: Diagnosis not present

## 2019-11-09 DIAGNOSIS — M25511 Pain in right shoulder: Secondary | ICD-10-CM

## 2019-11-09 MED ORDER — HYDROCHLOROTHIAZIDE 12.5 MG PO TABS: 25.0000 mg | ORAL_TABLET | Freq: Every day | ORAL | 1 refills | Status: DC

## 2019-11-09 MED ORDER — B COMPLEX PO TABS: 1.0000 | ORAL_TABLET | Freq: Every day | ORAL | 2 refills | Status: DC

## 2019-11-09 NOTE — Patient Instructions (Addendum)
Avoid overhead lifting and overhead use of the arms. Do not lift greater than 5 lbs. Adjust head rest in vehicle to prevent hyperextension if rear ended. Take extra precautions to avoid falling. Avoid overhead lifting and overhead use of the arms. Pillows to keep from sleeping directly on the shoulders Limited lifting to less than 10 lbs. Ice or heat for relief. NSAIDs are helpful, such as alleve or motrin, be careful not to use in excess as they place burdens on the kidney. Stretching exercise help and strengthening is helpful to build endurance. EMGs/ NCV of the right arm to assess for right C5,C6 or C8 or T1 radiculopathy  Repeat CT of hest in 3-6 months Please make an appt to see your primary care MD for treatment of hypertension and concerns of emphysema and bronchitis and stop smoking.

## 2019-11-09 NOTE — Progress Notes (Signed)
Office Visit Note   Patient: Katrina [name]           Date of Birth: Feb 21, 1956           MRN: [id number] Visit Date: 11/09/2019              Requested by: Shirline Frees, MD [address],  [place] [address] PCP: Shirline Frees, MD   Assessment & Plan: Visit Diagnoses:  1. Cubital tunnel syndrome on right   2. Ulnar neuropathy of right upper extremity   3. Mass of thoracic structure   4. Other spondylosis with radiculopathy, cervical region   5. Right shoulder pain, unspecified chronicity     Plan: Avoid overhead lifting and overhead use of the arms. Do not lift greater than 5 lbs. Adjust head rest in vehicle to prevent hyperextension if rear ended. Take extra precautions to avoid falling. Avoid overhead lifting and overhead use of the arms. Pillows to keep from sleeping directly on the shoulders Limited lifting to less than 10 lbs. Ice or heat for relief. NSAIDs are helpful, such as alleve or motrin, be careful not to use in excess as they place burdens on the kidney. Stretching exercise help and strengthening is helpful to build endurance. EMGs/ NCV of the right arm to assess for right C5,C6 or C8 or T1 radiculopathy Repeat CT of hest in 3-6 months Please make an appt to see your primary care MD for treatment of hypertension and concerns of emphysema and bronchitis and stop smoking.   Follow-Up Instructions: Return in about 3 weeks (around 11/30/2019).   Orders:  No orders of the defined types were placed in this encounter.  No orders of the defined types were placed in this encounter.     Procedures: No procedures performed   Clinical Data: No additional findings.   Subjective: Chief Complaint  Patient presents with  . Neck - Follow-up    CT Chest review  . Middle Back - Follow-up    CT Chest review    HPI  Review of Systems   Objective: Vital Signs: BP (!) 185/99 (BP Location: Left Arm, Patient Position: Sitting)    Pulse 97   Ht 5\' 7"  (1.702 m)   Wt 216 lb (98 kg)   BMI 33.83 kg/m   Physical Exam  Ortho Exam  Specialty Comments:  No specialty comments available.  Imaging: No results found.   PMFS History: Patient Active Problem List   Diagnosis Date Noted  . Postmenopausal bleeding 06/16/2018  . S/P hysterectomy 06/16/2018  . Chronic right shoulder pain 10/28/2016  . Impingement syndrome of right shoulder 10/28/2016  . Osteoarthritis of left knee 12/15/2014  . Status post total left knee replacement 12/15/2014  . Hand pain, left 04/21/2014  . Cat bite of finger 04/21/2014  . Asthma 04/21/2014  . Tobacco abuse 04/21/2014  . COPD (chronic obstructive pulmonary disease) ([place])   . Effusion of left knee joint 11/03/2013  . Arthritis of right knee 12/03/2012  . Degenerative arthritis of hip 05/02/2011   Past Medical History:  Diagnosis Date  . Anxiety   . Arthritis    knees, hips, elbows   . Asthma    hx of asthma 3/12- hospitalized   . COPD (chronic obstructive pulmonary disease) ([place])   . Depression   . GERD (gastroesophageal reflux disease)    PRIOR TO HAVING GALLBLADDER REMOVED  . Headache   . Left bundle branch block 06/28/2010  . Lung infection 2015  .  Lung infection   . volvulus     previous history of twisted intestines    Family History  Problem Relation Age of Onset  . Heart disease Mother     Past Surgical History:  Procedure Laterality Date  . BACK SURGERY     x2  . CARPAL TUNNEL RELEASE     left   . CESAREAN SECTION     x 2  . CHOLECYSTECTOMY    . COLONOSCOPY  10/2017  . DILATATION & CURETTAGE/HYSTEROSCOPY WITH MYOSURE N/A 12/10/2017   Procedure: DILATATION & CURETTAGE/HYSTEROSCOPY WITH MYOSURE;  Surgeon: Christophe Louis, MD;  Location: [facility];  Service: Gynecology;  Laterality: N/A;  POSSIBLE MYOMECTOMY VS POLYPECTOMY WITH MYOSURE  . DILATION AND CURETTAGE OF UTERUS    . ELBOW SURGERY    . JOINT REPLACEMENT    . KNEE ARTHROSCOPY     bilateral x 2   .  KNEE ARTHROSCOPY Left 11/03/2013   Procedure: LEFT KNEE ARTHROSCOPY WITH DEBRIDEMENT, PARTIAL SYNOVECTOMY;  Surgeon: Mcarthur Rossetti, MD;  Location: [facility];  Service: Orthopedics;  Laterality: Left;  . LAPAROSCOPY    . left elbow surgery     . OTHER SURGICAL HISTORY     arthroswcopic surgery right knee   . OTHER SURGICAL HISTORY     arthroscopic surgery left knee x 2   . OTHER SURGICAL HISTORY     bunionectomy right foot   . OTHER SURGICAL HISTORY     C Section x 2   . OTHER SURGICAL HISTORY     carpal tunnel on left   . RADIOLOGY WITH ANESTHESIA N/A 07/28/2019   Procedure: MRI WITH ANESTHESIA OF NECK SOFT TISSUE ONLY WITH AND WITHOUT CONTRAST;  Surgeon: Radiologist, Medication, MD;  Location: [facility];  Service: Radiology;  Laterality: N/A;  . RADIOLOGY WITH ANESTHESIA N/A 10/04/2019   Procedure: MRI WITH ANESTHESIA RIGHT SHOULDER WITHOUT CONTRAST,MRI OF CERVICAL SPINE WITHOUT CONTRAST;  Surgeon: Radiologist, Medication, MD;  Location: [facility];  Service: Radiology;  Laterality: N/A;  . right foot bunionectomy     . ROBOTIC ASSISTED TOTAL HYSTERECTOMY WITH BILATERAL SALPINGO OOPHERECTOMY Bilateral 06/16/2018   Procedure: XI ROBOTIC ASSISTED TOTAL HYSTERECTOMY WITH RIGHT SALPINGO OOPHORECTOMY;  Surgeon: Christophe Louis, MD;  [facility];  Service: Gynecology;  Laterality: Bilateral;  . TOTAL HIP ARTHROPLASTY  05/02/2011   Procedure: TOTAL HIP ARTHROPLASTY ANTERIOR APPROACH;  Surgeon: Mcarthur Rossetti, MD;  Location: [facility];  Service: Orthopedics;  Laterality: Left;  Left Total Hip Arthroplasty, Direct Anterior Approach  . TOTAL KNEE ARTHROPLASTY Right 12/03/2012   Procedure: RIGHT TOTAL KNEE ARTHROPLASTY;  Surgeon: Mcarthur Rossetti, MD;  Location: [facility];  Service: Orthopedics;  Laterality: Right;  . TOTAL KNEE ARTHROPLASTY Left 12/15/2014   Procedure: LEFT TOTAL KNEE ARTHROPLASTY;  Surgeon: Mcarthur Rossetti, MD;  Location: [facility];  Service: Orthopedics;   Laterality: Left;  . TUBAL LIGATION    . ULNAR NERVE TRANSPOSITION     left   . UPPER GI ENDOSCOPY     x 2   Social History   Occupational History  . Not on file  Tobacco Use  . Smoking status: Current Every Day Smoker    Packs/day: 0.25    Years: 25.00    Pack years: 6.25    Types: Cigarettes  . Smokeless tobacco: Never Used  . Tobacco comment: Pt reports that she is in the process of quitting  Vaping Use  . Vaping Use: Never used  Substance and Sexual  Activity  . Alcohol use: Yes    Comment: occasional beer   . Drug use: No    Comment: hx of 34 years ago marijuana   . Sexual activity: Not on file

## 2019-11-30 ENCOUNTER — Ambulatory Visit: Payer: Medicare Other | Admitting: Family Medicine

## 2019-11-30 ENCOUNTER — Encounter: Payer: Self-pay | Admitting: Family Medicine

## 2019-11-30 ENCOUNTER — Other Ambulatory Visit: Payer: Self-pay

## 2019-11-30 VITALS — BP 144/87 | HR 106 | Ht 66.6 in | Wt 218.2 lb

## 2019-11-30 DIAGNOSIS — J449 Chronic obstructive pulmonary disease, unspecified: Secondary | ICD-10-CM | POA: Diagnosis not present

## 2019-11-30 DIAGNOSIS — Z720 Tobacco use: Secondary | ICD-10-CM

## 2019-11-30 DIAGNOSIS — R918 Other nonspecific abnormal finding of lung field: Secondary | ICD-10-CM | POA: Insufficient documentation

## 2019-11-30 MED ORDER — ALBUTEROL SULFATE HFA 108 (90 BASE) MCG/ACT IN AERS
2.0000 | INHALATION_SPRAY | Freq: Four times a day (QID) | RESPIRATORY_TRACT | 2 refills | Status: DC | PRN
Start: 2019-11-30 — End: 2020-06-07

## 2019-11-30 MED ORDER — FLOVENT HFA 44 MCG/ACT IN AERO
INHALATION_SPRAY | RESPIRATORY_TRACT | 12 refills | Status: DC
Start: 2019-11-30 — End: 2020-06-07

## 2019-11-30 NOTE — Progress Notes (Signed)
Office Visit Note   Patient: Katrina [name]           Date of Birth: 1955-04-25           MRN: [id number] Visit Date: 11/30/2019 Requested by: Shirline Frees, MD [address],  [place] [address] PCP: Shirline Frees, MD  Subjective: Chief Complaint  Patient presents with  . consideration of establishing primary care    HPI: She is here to establish care.  I have seen her in the past about 10 years ago for problems that she was having with her hip.  Since most of her doctor visits are orthopedic in nature, she wanted to start coming here.  Her primary concern is with smoking cessation.  She has been a smoker for much of her life.  In the past she was able to quit smoking for 40 years when she had her first child.  Due to stress, she started smoking again and has continued to smoke since then.  This past year she has been diagnosed with lung nodules and emphysema.  She has started tapering her cigarette usage and is now smoking about 1/2 pack every 2 days.  She is concerned about taking something that might make her gain weight or trigger depression.  She has had severe depression in the past and has a family history of suicide in her brother.                ROS:   All other systems were reviewed and are negative.  Objective: Vital Signs: BP (!) 144/87   Pulse (!) 106   Ht 5' 6.6" (1.692 m)   Wt 218 lb 3.2 oz (99 kg)   BMI 34.59 kg/m   Physical Exam:  General:  Alert and oriented, in no acute distress. Pulm:  Breathing unlabored. Psy:  Normal mood, congruent affect.  Neck: No lymphadenopathy.  No thyromegaly or nodules.  No carotid bruits. CV: Regular rate and rhythm without murmurs, rubs, or gallops.  No peripheral edema.  2+ radial and posterior tibial pulses. Lungs: Clear to auscultation throughout with no wheezing or areas of consolidation.    Imaging: No results found.  Assessment & Plan: 1.  Tobacco abuse -We will investigate acupuncture  and hypnosis.  If these are not an option, then we will try bupropion.  2.  Emphysema -She has been using albuterol inhaler.  We will refill this and try a steroid inhaler as well.  3.  Lung nodules -She will be due for follow-up CT scan in October.     Procedures: No procedures performed  No notes on file     PMFS History: Patient Active Problem List   Diagnosis Date Noted  . Lung nodules 11/30/2019  . Neck pain 08/04/2019  . Acute pain of right shoulder 04/27/2019  . Neck mass 04/27/2019  . Postmenopausal bleeding 06/16/2018  . S/P hysterectomy 06/16/2018  . Chronic right shoulder pain 10/28/2016  . Impingement syndrome of right shoulder 10/28/2016  . Osteoarthritis of left knee 12/15/2014  . Status post total left knee replacement 12/15/2014  . Hand pain, left 04/21/2014  . Cat bite of finger 04/21/2014  . Asthma 04/21/2014  . Tobacco abuse 04/21/2014  . COPD (chronic obstructive pulmonary disease) ([place])   . Effusion of left knee joint 11/03/2013  . Arthritis of right knee 12/03/2012  . Degenerative arthritis of hip 05/02/2011   Past Medical History:  Diagnosis Date  . Anxiety   . Arthritis  knees, hips, elbows   . Asthma    hx of asthma 3/12- hospitalized   . COPD (chronic obstructive pulmonary disease) ([place])   . Depression   . GERD (gastroesophageal reflux disease)    PRIOR TO HAVING GALLBLADDER REMOVED  . Headache   . Left bundle branch block 06/28/2010  . Lung infection 2015  . Lung infection   . volvulus     previous history of twisted intestines    Family History  Problem Relation Age of Onset  . Heart disease Mother     Past Surgical History:  Procedure Laterality Date  . BACK SURGERY     x2  . CARPAL TUNNEL RELEASE     left   . CESAREAN SECTION     x 2  . CHOLECYSTECTOMY    . COLONOSCOPY  10/2017  . DILATATION & CURETTAGE/HYSTEROSCOPY WITH MYOSURE N/A 12/10/2017   Procedure: DILATATION & CURETTAGE/HYSTEROSCOPY WITH MYOSURE;  Surgeon:  Christophe Louis, MD;  Location: [facility];  Service: Gynecology;  Laterality: N/A;  POSSIBLE MYOMECTOMY VS POLYPECTOMY WITH MYOSURE  . DILATION AND CURETTAGE OF UTERUS    . ELBOW SURGERY    . JOINT REPLACEMENT    . KNEE ARTHROSCOPY     bilateral x 2   . KNEE ARTHROSCOPY Left 11/03/2013   Procedure: LEFT KNEE ARTHROSCOPY WITH DEBRIDEMENT, PARTIAL SYNOVECTOMY;  Surgeon: Mcarthur Rossetti, MD;  Location: [facility];  Service: Orthopedics;  Laterality: Left;  . LAPAROSCOPY    . left elbow surgery     . OTHER SURGICAL HISTORY     arthroswcopic surgery right knee   . OTHER SURGICAL HISTORY     arthroscopic surgery left knee x 2   . OTHER SURGICAL HISTORY     bunionectomy right foot   . OTHER SURGICAL HISTORY     C Section x 2   . OTHER SURGICAL HISTORY     carpal tunnel on left   . RADIOLOGY WITH ANESTHESIA N/A 07/28/2019   Procedure: MRI WITH ANESTHESIA OF NECK SOFT TISSUE ONLY WITH AND WITHOUT CONTRAST;  Surgeon: Radiologist, Medication, MD;  Location: [facility];  Service: Radiology;  Laterality: N/A;  . RADIOLOGY WITH ANESTHESIA N/A 10/04/2019   Procedure: MRI WITH ANESTHESIA RIGHT SHOULDER WITHOUT CONTRAST,MRI OF CERVICAL SPINE WITHOUT CONTRAST;  Surgeon: Radiologist, Medication, MD;  Location: [facility];  Service: Radiology;  Laterality: N/A;  . right foot bunionectomy     . ROBOTIC ASSISTED TOTAL HYSTERECTOMY WITH BILATERAL SALPINGO OOPHERECTOMY Bilateral 06/16/2018   Procedure: XI ROBOTIC ASSISTED TOTAL HYSTERECTOMY WITH RIGHT SALPINGO OOPHORECTOMY;  Surgeon: Christophe Louis, MD;  [facility];  Service: Gynecology;  Laterality: Bilateral;  . TOTAL HIP ARTHROPLASTY  05/02/2011   Procedure: TOTAL HIP ARTHROPLASTY ANTERIOR APPROACH;  Surgeon: Mcarthur Rossetti, MD;  Location: [facility];  Service: Orthopedics;  Laterality: Left;  Left Total Hip Arthroplasty, Direct Anterior Approach  . TOTAL KNEE ARTHROPLASTY Right 12/03/2012   Procedure: RIGHT TOTAL KNEE ARTHROPLASTY;  Surgeon:  Mcarthur Rossetti, MD;  Location: [facility];  Service: Orthopedics;  Laterality: Right;  . TOTAL KNEE ARTHROPLASTY Left 12/15/2014   Procedure: LEFT TOTAL KNEE ARTHROPLASTY;  Surgeon: Mcarthur Rossetti, MD;  Location: [facility];  Service: Orthopedics;  Laterality: Left;  . TUBAL LIGATION    . ULNAR NERVE TRANSPOSITION     left   . UPPER GI ENDOSCOPY     x 2   Social History   Occupational History  . Not on file  Tobacco Use  .  Smoking status: Current Every Day Smoker    Packs/day: 0.25    Years: 25.00    Pack years: 6.25    Types: Cigarettes  . Smokeless tobacco: Never Used  . Tobacco comment: Pt reports that she is in the process of quitting  Vaping Use  . Vaping Use: Never used  Substance and Sexual Activity  . Alcohol use: Yes    Comment: occasional beer   . Drug use: No    Comment: hx of 34 years ago marijuana   . Sexual activity: Not on file

## 2019-12-23 ENCOUNTER — Encounter: Payer: Self-pay | Admitting: Physical Medicine and Rehabilitation

## 2019-12-23 ENCOUNTER — Other Ambulatory Visit: Payer: Self-pay

## 2019-12-23 ENCOUNTER — Ambulatory Visit: Payer: Medicare Other | Admitting: Physical Medicine and Rehabilitation

## 2019-12-23 DIAGNOSIS — R202 Paresthesia of skin: Secondary | ICD-10-CM | POA: Diagnosis not present

## 2019-12-23 NOTE — Progress Notes (Signed)
Numbness in first 3 fingers of right hand. Pain in right side of neck, right shoulder, right upper arm. Can't lift right arm above head. Symptoms started in January. Right hand dominant No lotion per patient Numeric Pain Rating Scale and Functional Assessment Average Pain 5   In the last MONTH (on 0-10 scale) has pain interfered with the following?  1. General activity like being  able to carry out your everyday physical activities such as walking, climbing stairs, carrying groceries, or moving a chair?  Rating(9)

## 2019-12-27 NOTE — Procedures (Signed)
EMG & NCV Findings: Evaluation of the right ulnar sensory nerve showed prolonged distal peak latency (6.3 ms), reduced amplitude (11.4 V), and decreased conduction velocity (Wrist-5th Digit, 22 m/s).  All remaining nerves (as indicated in the following tables) were within normal limits.    Needle evaluation of the right pronator teres muscle showed increased insertional activity, slightly increased spontaneous activity, and diminished recruitment.  The right biceps muscle showed increased insertional activity and diminished recruitment.  All remaining muscles (as indicated in the following table) showed no evidence of electrical instability.    Impression: The above electrodiagnostic study is ABNORMAL and reveals evidence of moderate chronic C6 radiculopathy on the right.  There is no significant electrodiagnostic evidence of any other focal nerve entrapment, brachial plexopathy or generalized peripheral neuropathy.   *There is significant significant improvement of the ulnar nerve since the last electrodiagnostic study and ulnar nerve transposition.  There is still some distal slowing at the wrist.  Recommendations: 1.  Follow-up with referring physician. 2.  Continue current management of symptoms.  ___________________________ Laurence Spates FAAPMR Board Certified, American Board of Physical Medicine and Rehabilitation    Nerve Conduction Studies Anti Sensory Summary Table   Stim Site NR Peak (ms) Norm Peak (ms) P-T Amp (V) Norm P-T Amp Site1 Site2 Delta-P (ms) Dist (cm) Vel (m/s) Norm Vel (m/s)  Right Median Acr Palm Anti Sensory (2nd Digit)  30.7C  Wrist    3.4 <3.6 19.8 >10 Wrist Palm 1.4 0.0    Palm    2.0 <2.0 21.8         Right Radial Anti Sensory (Base 1st Digit)  30.6C  Wrist    2.0 <3.1 14.7  Wrist Base 1st Digit 2.0 0.0    Right Ulnar Anti Sensory (5th Digit)  30.9C  Wrist    *6.3 <3.7 *11.4 >15.0 Wrist 5th Digit 6.3 14.0 *22 >38   Motor Summary Table   Stim Site NR  Onset (ms) Norm Onset (ms) O-P Amp (mV) Norm O-P Amp Site1 Site2 Delta-0 (ms) Dist (cm) Vel (m/s) Norm Vel (m/s)  Right Median Motor (Abd Poll Brev)  30.5C  Wrist    3.3 <4.2 8.2 >5 Elbow Wrist 4.2 21.0 50 >50  Elbow    7.5  8.2         Right Ulnar Motor (Abd Dig Min)  30.6C  Wrist    3.2 <4.2 7.9 >3 B Elbow Wrist 3.7 20.0 54 >53  B Elbow    6.9  8.9  A Elbow B Elbow 1.6 10.0 63 >53  A Elbow    8.5  2.7          EMG   Side Muscle Nerve Root Ins Act Fibs Psw Amp Dur Poly Recrt Int Fraser Din Comment  Right Abd Poll Brev Median C8-T1 Nml Nml Nml Nml Nml 0 Nml Nml   Right 1stDorInt Ulnar C8-T1 Nml Nml Nml Nml Nml 0 Nml Nml   Right PronatorTeres Median C6-7 *CRD *1+ *1+ Nml Nml 0 *Reduced Nml   Right Biceps Musculocut C5-6 *CRD Nml Nml Nml Nml 0 *Reduced Nml   Right Deltoid Axillary C5-6 Nml Nml Nml Nml Nml 0 Nml Nml     Nerve Conduction Studies Anti Sensory Left/Right Comparison   Stim Site L Lat (ms) R Lat (ms) L-R Lat (ms) L Amp (V) R Amp (V) L-R Amp (%) Site1 Site2 L Vel (m/s) R Vel (m/s) L-R Vel (m/s)  Median Acr Palm Anti Sensory (2nd Digit)  30.7C  Wrist  3.4   19.8  Wrist Palm     Palm  2.0   21.8        Radial Anti Sensory (Base 1st Digit)  30.6C  Wrist  2.0   14.7  Wrist Base 1st Digit     Ulnar Anti Sensory (5th Digit)  30.9C  Wrist  *6.3   *11.4  Wrist 5th Digit  *22    Motor Left/Right Comparison   Stim Site L Lat (ms) R Lat (ms) L-R Lat (ms) L Amp (mV) R Amp (mV) L-R Amp (%) Site1 Site2 L Vel (m/s) R Vel (m/s) L-R Vel (m/s)  Median Motor (Abd Poll Brev)  30.5C  Wrist  3.3   8.2  Elbow Wrist  50   Elbow  7.5   8.2        Ulnar Motor (Abd Dig Min)  30.6C  Wrist  3.2   7.9  B Elbow Wrist  54   B Elbow  6.9   8.9  A Elbow B Elbow  63   A Elbow  8.5   2.7           Waveforms:

## 2019-12-27 NOTE — Progress Notes (Signed)
Katrina [name] - 63 y.o. female MRN [id number]  Date of birth: [birth year]  Office Visit Note: Visit Date: 12/23/2019 PCP: Eunice Blase, MD Referred by: Shirline Frees, MD  Subjective: Chief Complaint  Patient presents with  . Right Hand - Numbness  . Right Arm - Pain   HPI:  Katrina [name] is a 64 y.o. female who comes in today at the request of Dr. Basil Dess for electrodiagnostic study of the Right upper extremities.  Patient is Right hand dominant.  By way of review I saw the patient in January, April 15, 2019 to be exact, for electrodiagnostic study of the right upper limb.  At that time she had significant ulnar neuropathy at the elbow.  Since then she has undergone transposition of the ulnar nerve with improvement of symptoms into the fourth and fifth digit on the hand..  She now has pain numbness and tingling in the radial 3 digits.  On that prior electrodiagnostic study there was no evidence of median nerve neuropathy.  In fact it was borderline to very mild.  She has had issue of some soft tissue swelling of the neck and has had MRI of this and this continues to be evaluated by Dr. Louanne Skye.  She denies any left-sided complaints.  She does get symptoms down the arm at times in the shoulder and into the forearm.  ROS Otherwise per HPI.  Assessment & Plan: Visit Diagnoses:  1. Paresthesia of skin     Plan: Impression: The above electrodiagnostic study is ABNORMAL and reveals evidence of moderate chronic C6 radiculopathy on the right.  There is no significant electrodiagnostic evidence of any other focal nerve entrapment, brachial plexopathy or generalized peripheral neuropathy.   *There is significant significant improvement of the ulnar nerve since the last electrodiagnostic study and ulnar nerve transposition.  There is still some distal slowing at the wrist.  Recommendations: 1.  Follow-up with referring physician. 2.  Continue current management of symptoms.  Meds &  Orders: No orders of the defined types were placed in this encounter.   Orders Placed This Encounter  Procedures  . NCV with EMG (electromyography)    Follow-up: Return for Basil Dess, MD.   Procedures: No procedures performed  EMG & NCV Findings: Evaluation of the right ulnar sensory nerve showed prolonged distal peak latency (6.3 ms), reduced amplitude (11.4 V), and decreased conduction velocity (Wrist-5th Digit, 22 m/s).  All remaining nerves (as indicated in the following tables) were within normal limits.    Needle evaluation of the right pronator teres muscle showed increased insertional activity, slightly increased spontaneous activity, and diminished recruitment.  The right biceps muscle showed increased insertional activity and diminished recruitment.  All remaining muscles (as indicated in the following table) showed no evidence of electrical instability.    Impression: The above electrodiagnostic study is ABNORMAL and reveals evidence of moderate chronic C6 radiculopathy on the right.  There is no significant electrodiagnostic evidence of any other focal nerve entrapment, brachial plexopathy or generalized peripheral neuropathy.   *There is significant significant improvement of the ulnar nerve since the last electrodiagnostic study and ulnar nerve transposition.  There is still some distal slowing at the wrist.  Recommendations: 1.  Follow-up with referring physician. 2.  Continue current management of symptoms.  ___________________________ Wonda Olds Board Certified, American Board of Physical Medicine and Rehabilitation    Nerve Conduction Studies Anti Sensory Summary Table   Stim Site NR Peak (ms) Norm Peak (ms) P-T Amp (  V) Norm P-T Amp Site1 Site2 Delta-P (ms) Dist (cm) Vel (m/s) Norm Vel (m/s)  Right Median Acr Palm Anti Sensory (2nd Digit)  30.7C  Wrist    3.4 <3.6 19.8 >10 Wrist Palm 1.4 0.0    Palm    2.0 <2.0 21.8         Right Radial Anti Sensory  (Base 1st Digit)  30.6C  Wrist    2.0 <3.1 14.7  Wrist Base 1st Digit 2.0 0.0    Right Ulnar Anti Sensory (5th Digit)  30.9C  Wrist    *6.3 <3.7 *11.4 >15.0 Wrist 5th Digit 6.3 14.0 *22 >38   Motor Summary Table   Stim Site NR Onset (ms) Norm Onset (ms) O-P Amp (mV) Norm O-P Amp Site1 Site2 Delta-0 (ms) Dist (cm) Vel (m/s) Norm Vel (m/s)  Right Median Motor (Abd Poll Brev)  30.5C  Wrist    3.3 <4.2 8.2 >5 Elbow Wrist 4.2 21.0 50 >50  Elbow    7.5  8.2         Right Ulnar Motor (Abd Dig Min)  30.6C  Wrist    3.2 <4.2 7.9 >3 B Elbow Wrist 3.7 20.0 54 >53  B Elbow    6.9  8.9  A Elbow B Elbow 1.6 10.0 63 >53  A Elbow    8.5  2.7          EMG   Side Muscle Nerve Root Ins Act Fibs Psw Amp Dur Poly Recrt Int Fraser Din Comment  Right Abd Poll Brev Median C8-T1 Nml Nml Nml Nml Nml 0 Nml Nml   Right 1stDorInt Ulnar C8-T1 Nml Nml Nml Nml Nml 0 Nml Nml   Right PronatorTeres Median C6-7 *CRD *1+ *1+ Nml Nml 0 *Reduced Nml   Right Biceps Musculocut C5-6 *CRD Nml Nml Nml Nml 0 *Reduced Nml   Right Deltoid Axillary C5-6 Nml Nml Nml Nml Nml 0 Nml Nml     Nerve Conduction Studies Anti Sensory Left/Right Comparison   Stim Site L Lat (ms) R Lat (ms) L-R Lat (ms) L Amp (V) R Amp (V) L-R Amp (%) Site1 Site2 L Vel (m/s) R Vel (m/s) L-R Vel (m/s)  Median Acr Palm Anti Sensory (2nd Digit)  30.7C  Wrist  3.4   19.8  Wrist Palm     Palm  2.0   21.8        Radial Anti Sensory (Base 1st Digit)  30.6C  Wrist  2.0   14.7  Wrist Base 1st Digit     Ulnar Anti Sensory (5th Digit)  30.9C  Wrist  *6.3   *11.4  Wrist 5th Digit  *22    Motor Left/Right Comparison   Stim Site L Lat (ms) R Lat (ms) L-R Lat (ms) L Amp (mV) R Amp (mV) L-R Amp (%) Site1 Site2 L Vel (m/s) R Vel (m/s) L-R Vel (m/s)  Median Motor (Abd Poll Brev)  30.5C  Wrist  3.3   8.2  Elbow Wrist  50   Elbow  7.5   8.2        Ulnar Motor (Abd Dig Min)  30.6C  Wrist  3.2   7.9  B Elbow Wrist  54   B Elbow  6.9   8.9  A Elbow B Elbow  63   A  Elbow  8.5   2.7           Waveforms:             Clinical History:  Electrodiagnostic study April 15, 2019  Impression: The above electrodiagnostic study is ABNORMAL and reveals evidence of a 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   There is no significant electrodiagnostic evidence of any other focal nerve entrapment, brachial plexopathy or cervical radiculopathy.   Recommendations: 1.  Follow-up with referring physician. 2.  Continue current management of symptoms. 3.  Suggest surgical evaluation for the cubital tunnel.  Suggest evaluation and possible MRI cervical spine do to soft tissue swelling in the neck.  ___________________________ Wonda Olds Board Certified, American Board of Physical Medicine and Rehabilitation     Objective:  VS:  HT:    WT:   BMI:     BP:   HR: bpm  TEMP: ( )  RESP:  Physical Exam Musculoskeletal:        General: No swelling, tenderness or deformity.     Comments: Inspection reveals well-healed surgical scar on the right for ulnar nerve transposition but no atrophy of the bilateral APB or FDI or hand intrinsics. There is no swelling, color changes, allodynia or dystrophic changes. There is 5 out of 5 strength in the bilateral wrist extension, finger abduction and long finger flexion. There is intact sensation to light touch in all dermatomal and peripheral nerve distributions. There is a negative Phalen's test bilaterally. There is a negative Hoffmann's test bilaterally.  Skin:    General: Skin is warm and dry.     Findings: No erythema or rash.  Neurological:     General: No focal deficit present.     Mental Status: She is alert and oriented to person, place, and time.     Motor: No  weakness or abnormal muscle tone.     Coordination: Coordination normal.  Psychiatric:        Mood and Affect: Mood normal.        Behavior: Behavior normal.      Imaging: No results found.

## 2020-01-06 ENCOUNTER — Telehealth: Payer: Self-pay

## 2020-01-06 ENCOUNTER — Ambulatory Visit (INDEPENDENT_AMBULATORY_CARE_PROVIDER_SITE_OTHER): Payer: Medicare Other | Admitting: Specialist

## 2020-01-06 ENCOUNTER — Other Ambulatory Visit: Payer: Self-pay

## 2020-01-06 ENCOUNTER — Encounter: Payer: Self-pay | Admitting: Specialist

## 2020-01-06 VITALS — BP 146/84 | HR 87 | Ht 66.6 in | Wt 218.2 lb

## 2020-01-06 DIAGNOSIS — M4722 Other spondylosis with radiculopathy, cervical region: Secondary | ICD-10-CM | POA: Diagnosis not present

## 2020-01-06 DIAGNOSIS — M5412 Radiculopathy, cervical region: Secondary | ICD-10-CM | POA: Diagnosis not present

## 2020-01-06 MED ORDER — AMITRIPTYLINE HCL 10 MG PO TABS
10.0000 mg | ORAL_TABLET | Freq: Every day | ORAL | 3 refills | Status: DC
Start: 2020-01-06 — End: 2020-02-22

## 2020-01-06 NOTE — Patient Instructions (Addendum)
Avoid overhead lifting and overhead use of the arms. Do not lift greater than 5 lbs. Adjust head rest in vehicle to prevent hyperextension if rear ended. Take extra precautions to avoid falling, including use of a cane if you feel weak.  Elavil 10 mg po qhs

## 2020-01-06 NOTE — Progress Notes (Signed)
Office Visit Note   Patient: Katrina [name]           Date of Birth: 04-16-1955           MRN: [id number] Visit Date: 01/06/2020              Requested by: Shirline Frees, MD [place],   [id number] PCP: Eunice Blase, MD   Assessment & Plan: Visit Diagnoses:  1. Cervical radiculopathy   2. Other spondylosis with radiculopathy, cervical region     Plan: Avoid overhead lifting and overhead use of the arms. Do not lift greater than 5 lbs. Adjust head rest in vehicle to prevent hyperextension if rear ended. Take extra precautions to avoid falling, including use of a cane if you feel weak.  Elavil 10 mg po qhs  Follow-Up Instructions: Return in about 3 weeks (around 01/27/2020).   Orders:  Orders Placed This Encounter  Procedures  . DG Myelogram Cervical  . CT CERVICAL SPINE W CONTRAST   Meds ordered this encounter  Medications  . amitriptyline (ELAVIL) 10 MG tablet    Sig: Take 1 tablet (10 mg total) by mouth at bedtime.    Dispense:  30 tablet    Refill:  3      Procedures: No procedures performed   Clinical Data: Findings:        Katrina [name] - 64 y.o. female MRN [id number]  Date of birth: 12-07-1955  Office Visit Note: Visit Date: 12/23/2019 PCP: Eunice Blase, MD Referred by: Shirline Frees, MD  Subjective:  Chief Complaint Patient presents with . Right Hand - Numbness . Right Arm - Pain  HPI:  Katrina [name] is a 64 y.o. female who comes in today at the request of Dr. Basil Dess for electrodiagnostic study of the Right upper extremities.  Patient is Right hand dominant.  By way of review I saw the patient in January, April 15, 2019 to be exact, for electrodiagnostic study of the right upper limb.  At that time she had significant ulnar neuropathy at the elbow.  Since then she has undergone transposition of the ulnar nerve with improvement of symptoms into the fourth and fifth digit on the hand..   She now has pain numbness and tingling in the radial 3 digits.  On that prior electrodiagnostic study there was no evidence of median nerve neuropathy.  In fact it was borderline to very mild.  She has had issue of some soft tissue swelling of the neck and has had MRI of this and this continues to be evaluated by Dr. Louanne Skye.  She denies any left-sided complaints.  She does get symptoms down the arm at times in the shoulder and into the forearm.  ROS Otherwise per HPI.  Assessment & Plan: Visit Diagnoses:   1. Paresthesia of skin    Plan: Impression: The above electrodiagnostic study is ABNORMAL and reveals evidence of moderate chronic C6 radiculopathy on the right.  There is no significant electrodiagnostic evidence of any other focal nerve entrapment, brachial plexopathy or generalized peripheral neuropathy.   *There is significant significant improvement of the ulnar nerve since the last electrodiagnostic study and ulnar nerve transposition.  There is still some distal slowing at the wrist.  Recommendations: 1.  Follow-up with referring physician. 2.  Continue current management of symptoms.  Meds & Orders: No orders of the defined types were placed in this encounter.    Orders Placed This Encounter Procedures . NCV  with EMG (electromyography)   Follow-up: Return for Basil Dess, MD.   Procedures: No procedures performed  EMG & NCV Findings: Evaluation of the right ulnar sensory nerve showed prolonged distal peak latency (6.3 ms), reduced amplitude (11.4 V), and decreased conduction velocity (Wrist-5th Digit, 22 m/s).  All remaining nerves (as indicated in the following tables) were within normal limits.    Needle evaluation of the right pronator teres muscle showed increased insertional activity, slightly increased spontaneous activity, and diminished recruitment.  The right biceps muscle showed increased insertional activity and diminished recruitment.  All remaining  muscles (as indicated in the following table) showed no evidence of electrical instability.    Impression: The above electrodiagnostic study is ABNORMAL and reveals evidence of moderate chronic C6 radiculopathy on the right.  There is no significant electrodiagnostic evidence of any other focal nerve entrapment, brachial plexopathy or generalized peripheral neuropathy.   *There is significant significant improvement of the ulnar nerve since the last electrodiagnostic study and ulnar nerve transposition.  There is still some distal slowing at the wrist.  Recommendations: 1.  Follow-up with referring physician. 2.  Continue current management of symptoms.  ___________________________ Katrina [name] FAAPMR Board Certified, American Board of Physical Medicine and Rehabilitation   Nerve Conduction Studies Anti Sensory Summary Table    Stim Site NR Peak (ms) Norm Peak (ms) P-T Amp (V) Norm P-T Amp Site1 Site2 Delta-P (ms) Dist (cm) Vel (m/s) Norm Vel (m/s) Right Median Acr Palm Anti Sensory (2nd Digit)  30.7C Wrist    3.4 <3.6 19.8 >10 Wrist Palm 1.4 0.0   Palm    2.0 <2.0 21.8        Right Radial Anti Sensory (Base 1st Digit)  30.6C Wrist    2.0 <3.1 14.7  Wrist Base 1st Digit 2.0 0.0   Right Ulnar Anti Sensory (5th Digit)  30.9C Wrist    *6.3 <3.7 *11.4 >15.0 Wrist 5th Digit 6.3 14.0 *22 >38  Motor Summary Table    Stim Site NR Onset (ms) Norm Onset (ms) O-P Amp (mV) Norm O-P Amp Site1 Site2 Delta-0 (ms) Dist (cm) Vel (m/s) Norm Vel (m/s) Right Median Motor (Abd Poll Brev)  30.5C Wrist    3.3 <4.2 8.2 >5 Elbow Wrist 4.2 21.0 50 >50 Elbow    7.5  8.2        Right Ulnar Motor (Abd Dig Min)  30.6C Wrist    3.2 <4.2 7.9 >3 B Elbow Wrist 3.7 20.0 54 >53 B Elbow    6.9  8.9  A Elbow B Elbow 1.6 10.0 63 >53 A Elbow    8.5  2.7         EMG    Side Muscle Nerve Root Ins Act Fibs Psw Amp Dur Poly Recrt Int Fraser Din Comment Right Abd Poll  Brev Median C8-T1 Nml Nml Nml Nml Nml 0 Nml Nml  Right 1stDorInt Ulnar C8-T1 Nml Nml Nml Nml Nml 0 Nml Nml  Right PronatorTeres Median C6-7 *CRD *1+ *1+ Nml Nml 0 *Reduced Nml  Right Biceps Musculocut C5-6 *CRD Nml Nml Nml Nml 0 *Reduced Nml  Right Deltoid Axillary C5-6 Nml Nml Nml Nml Nml 0 Nml Nml    Nerve Conduction Studies Anti Sensory Left/Right Comparison    Stim Site L Lat (ms) R Lat (ms) L-R Lat (ms) L Amp (V) R Amp (V) L-R Amp (%) Site1 Site2 L Vel (m/s) R Vel (m/s) L-R Vel (m/s) Median Acr Palm Anti Sensory (2nd Digit)  30.7C Wrist  3.4   19.8  Wrist Palm    Palm  2.0   21.8       Radial Anti Sensory (Base 1st Digit)  30.6C Wrist  2.0   14.7  Wrist Base 1st Digit    Ulnar Anti Sensory (5th Digit)  30.9C Wrist  *6.3   *11.4  Wrist 5th Digit  *22   Motor Left/Right Comparison    Stim Site L Lat (ms) R Lat (ms) L-R Lat (ms) L Amp (mV) R Amp (mV) L-R Amp (%) Site1 Site2 L Vel (m/s) R Vel (m/s) L-R Vel (m/s) Median Motor (Abd Poll Brev)  30.5C Wrist  3.3   8.2  Elbow Wrist  50  Elbow  7.5   8.2       Ulnar Motor (Abd Dig Min)  30.6C Wrist  3.2   7.9  B Elbow Wrist  54  B Elbow  6.9   8.9  A Elbow B Elbow  63  A Elbow  8.5   2.7         Waveforms:             Clinical History: Electrodiagnostic study April 15, 2019  Impression: The above electrodiagnostic study is ABNORMAL and reveals evidence of a 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   There is no significant electrodiagnostic evidence of any other focal nerve entrapment, brachial plexopathy or cervical radiculopathy.   Recommendations: 1.  Follow-up with referring  physician. 2.  Continue current management of symptoms. 3.  Suggest surgical evaluation for the cubital tunnel.  Suggest evaluation and possible MRI cervical spine do to soft tissue swelling in the neck.  ___________________________ Katrina [name] Board Certified, American Board of Physical Medicine and Rehabilitation     Objective:  VS:  HT:    WT:   BMI:     BP:   HR: bpm  TEMP: ( )  RESP:  Physical Exam Musculoskeletal:        General: No swelling, tenderness or deformity.     Comments: Inspection reveals well-healed surgical scar on the right for ulnar nerve transposition but no atrophy of the bilateral APB or FDI or hand intrinsics. There is no swelling, color changes, allodynia or dystrophic changes. There is 5 out of 5 strength in the bilateral wrist extension, finger abduction and long finger flexion. There is intact sensation to light touch in all dermatomal and peripheral nerve distributions. There is a negative Phalen's test bilaterally. There is a negative Hoffmann's test bilaterally.  Skin:    General: Skin is warm and dry.     Findings: No erythema or rash.  Neurological:     General: No focal deficit present.     Mental Status: She is alert and oriented to person, place, and time.     Motor: No weakness or abnormal muscle tone.     Coordination: Coordination normal.  Psychiatric:        Mood and Affect: Mood normal.        Behavior: Behavior normal.      Imaging: No results found.  Procedures by Magnus Sinning, MD at 12/23/2019 8:15 AM  Author: Magnus Sinning, MD Author Type: Physician Filed: 12/27/2019 12:38 PM Note Status: Signed Cosign: Cosign Not Required Encounter Date:  12/23/2019 Editor: Magnus Sinning, MD (Physician)            Procedure Orders: 1. NCV with EMG (electromyography) ([id number]) ordered by Magnus Sinning, MD at 12/23/19 0818  Pre-procedure Diagnoses 1. Paresthesia of skin (R20.2)     EMG & NCV  Findings: Evaluation of the right ulnar sensory nerve showed prolonged distal peak latency (6.3 ms), reduced amplitude (11.4 V), and decreased conduction velocity (Wrist-5th Digit, 22 m/s).  All remaining nerves (as indicated in the following tables) were within normal limits.    Needle evaluation of the right pronator teres muscle showed increased insertional activity, slightly increased spontaneous activity, and diminished recruitment.  The right biceps muscle showed increased insertional activity and diminished recruitment.  All remaining muscles (as indicated in the following table) showed no evidence of electrical instability.    Impression: The above electrodiagnostic study is ABNORMAL and reveals evidence of moderate chronic C6 radiculopathy on the right.  There is no significant electrodiagnostic evidence of any other focal nerve entrapment, brachial plexopathy or generalized peripheral neuropathy.   *There is significant significant improvement of the ulnar nerve since the last electrodiagnostic study and ulnar nerve transposition.  There is still some distal slowing at the wrist.  Recommendations: 1.  Follow-up with referring physician. 2.  Continue current management of symptoms.  ___________________________ Katrina [name] FAAPMR Board Certified, American Board of Physical Medicine and Rehabilitation   Nerve Conduction Studies Anti Sensory Summary Table    Stim Site NR Peak (ms) Norm Peak (ms) P-T Amp (V) Norm P-T Amp Site1 Site2 Delta-P (ms) Dist (cm) Vel (m/s) Norm Vel (m/s) Right Median Acr Palm Anti Sensory (2nd Digit)  30.7C Wrist    3.4 <3.6 19.8 >10 Wrist Palm 1.4 0.0   Palm    2.0 <2.0 21.8        Right Radial Anti Sensory (Base 1st Digit)  30.6C Wrist    2.0 <3.1 14.7  Wrist Base             Subjective: Chief Complaint  Patient presents with  . Right Hand - Follow-up    64 year old right handed female with numbness and tingling into the  right hand since January 2021, no history of neck pain in the past. Had pain with awaking. Had nerve conduction tests and EMGs the right ulnar n moved. Then after that pain came up on the neck right side.  Reports that she has a lot of need for the right arm to come back including assisting with care of husband. The right hand feels weak and she is dropping items. She has night pain and has difficulty with manipulation of her husbands wheelchair. Results of the EMG/NCV are available for review.    Review of Systems  Constitutional: Negative.   HENT: Positive for sinus pressure, sinus pain and sneezing.   Eyes: Negative.   Respiratory: Positive for cough, shortness of breath and wheezing.   Cardiovascular: Negative.   Gastrointestinal: Negative.   Endocrine: Negative.   Genitourinary: Negative.   Musculoskeletal: Positive for neck pain and neck stiffness.  Skin: Negative.   Allergic/Immunologic: Negative.   Neurological: Positive for weakness and numbness.  Hematological: Negative.   Psychiatric/Behavioral: Negative.      Objective: Vital Signs: BP (!) 146/84   Pulse 87   Ht 5' 6.6" (1.692 m)   Wt 218 lb 3.2 oz (99 kg)   BMI 34.59 kg/m   Physical Exam Constitutional:      Appearance: She is well-developed.  HENT:     Head: Normocephalic and atraumatic.  Eyes:     Pupils: Pupils are equal, round, and reactive to light.  Pulmonary:     Effort: Pulmonary effort is normal.     Breath sounds: Normal breath sounds.  Abdominal:     General: Bowel sounds are normal.     Palpations: Abdomen is soft.  Musculoskeletal:     Cervical back: Normal range of motion and neck supple.     Lumbar back: Negative right straight leg raise test and negative left straight leg raise test.  Skin:    General: Skin is warm and dry.  Neurological:     Mental Status: She is alert and oriented to person, place, and time.  Psychiatric:        Behavior: Behavior normal.        Thought Content:  Thought content normal.        Judgment: Judgment normal.     Back Exam   Tenderness  The patient is experiencing tenderness in the cervical.  Range of Motion  Extension: abnormal  Flexion: normal  Lateral bend right: abnormal  Lateral bend left: abnormal  Rotation right: abnormal  Rotation left: abnormal   Muscle Strength  Right Quadriceps:  5/5  Left Quadriceps:  5/5  Right Hamstrings:  5/5  Left Hamstrings:  5/5   Tests  Straight leg raise right: negative Straight leg raise left: negative  Reflexes  Patellar: 2/4 Achilles: 2/4 Biceps: 2/4 Babinski's sign: normal   Other  Toe walk: normal Heel walk: normal Sensation: decreased Gait: normal  Erythema: no back redness Scars: absent  Comments:  Right biceps weakness 4/5 and right triceps weakness 4/5      Specialty Comments:  No specialty comments available.  Imaging: No results found.   PMFS History: Patient Active Problem List   Diagnosis Date Noted  . Lung nodules 11/30/2019  . Neck pain 08/04/2019  . Acute pain of right shoulder 04/27/2019  . Neck mass 04/27/2019  . Postmenopausal bleeding 06/16/2018  . S/P hysterectomy 06/16/2018  . Chronic right shoulder pain 10/28/2016  . Impingement syndrome of right shoulder 10/28/2016  . Osteoarthritis of left knee 12/15/2014  . Status post total left knee replacement 12/15/2014  . Hand pain, left 04/21/2014  . Cat bite of finger 04/21/2014  . Asthma 04/21/2014  . Tobacco abuse 04/21/2014  . COPD (chronic obstructive pulmonary disease) ([place])   . Effusion of left knee joint 11/03/2013  . Arthritis of right knee 12/03/2012  . Degenerative arthritis of hip 05/02/2011   Past Medical History:  Diagnosis Date  . Anxiety   . Arthritis    knees, hips, elbows   . Asthma    hx of asthma 3/12- hospitalized   . COPD (chronic obstructive pulmonary disease) ([place])   . Depression   . GERD (gastroesophageal reflux disease)    PRIOR TO HAVING GALLBLADDER  REMOVED  . Headache   . Left bundle branch block 06/28/2010  . Lung infection 2015  . Lung infection   . volvulus     previous history of twisted intestines    Family History  Problem Relation Age of Onset  . Heart disease Mother     Past Surgical History:  Procedure Laterality Date  . BACK SURGERY     x2  . CARPAL TUNNEL RELEASE     left   . CESAREAN SECTION     x 2  . CHOLECYSTECTOMY    . COLONOSCOPY  10/2017  .  DILATATION & CURETTAGE/HYSTEROSCOPY WITH MYOSURE N/A 12/10/2017   Procedure: DILATATION & CURETTAGE/HYSTEROSCOPY WITH MYOSURE;  Surgeon: Christophe Louis, MD;  Location: [facility];  Service: Gynecology;  Laterality: N/A;  POSSIBLE MYOMECTOMY VS POLYPECTOMY WITH MYOSURE  . DILATION AND CURETTAGE OF UTERUS    . ELBOW SURGERY    . JOINT REPLACEMENT    . KNEE ARTHROSCOPY     bilateral x 2   . KNEE ARTHROSCOPY Left 11/03/2013   Procedure: LEFT KNEE ARTHROSCOPY WITH DEBRIDEMENT, PARTIAL SYNOVECTOMY;  Surgeon: Mcarthur Rossetti, MD;  Location: [facility];  Service: Orthopedics;  Laterality: Left;  . LAPAROSCOPY    . left elbow surgery     . OTHER SURGICAL HISTORY     arthroswcopic surgery right knee   . OTHER SURGICAL HISTORY     arthroscopic surgery left knee x 2   . OTHER SURGICAL HISTORY     bunionectomy right foot   . OTHER SURGICAL HISTORY     C Section x 2   . OTHER SURGICAL HISTORY     carpal tunnel on left   . RADIOLOGY WITH ANESTHESIA N/A 07/28/2019   Procedure: MRI WITH ANESTHESIA OF NECK SOFT TISSUE ONLY WITH AND WITHOUT CONTRAST;  Surgeon: Radiologist, Medication, MD;  Location: [facility];  Service: Radiology;  Laterality: N/A;  . RADIOLOGY WITH ANESTHESIA N/A 10/04/2019   Procedure: MRI WITH ANESTHESIA RIGHT SHOULDER WITHOUT CONTRAST,MRI OF CERVICAL SPINE WITHOUT CONTRAST;  Surgeon: Radiologist, Medication, MD;  Location: [facility];  Service: Radiology;  Laterality: N/A;  . right foot bunionectomy     . ROBOTIC ASSISTED TOTAL HYSTERECTOMY WITH BILATERAL SALPINGO  OOPHERECTOMY Bilateral 06/16/2018   Procedure: XI ROBOTIC ASSISTED TOTAL HYSTERECTOMY WITH RIGHT SALPINGO OOPHORECTOMY;  Surgeon: Christophe Louis, MD;  Location: [facility];  Service: Gynecology;  Laterality: Bilateral;  . TOTAL HIP ARTHROPLASTY  05/02/2011   Procedure: TOTAL HIP ARTHROPLASTY ANTERIOR APPROACH;  Surgeon: Mcarthur Rossetti, MD;  Location: [facility];  Service: Orthopedics;  Laterality: Left;  Left Total Hip Arthroplasty, Direct Anterior Approach  . TOTAL KNEE ARTHROPLASTY Right 12/03/2012   Procedure: RIGHT TOTAL KNEE ARTHROPLASTY;  Surgeon: Mcarthur Rossetti, MD;  Location: [facility];  Service: Orthopedics;  Laterality: Right;  . TOTAL KNEE ARTHROPLASTY Left 12/15/2014   Procedure: LEFT TOTAL KNEE ARTHROPLASTY;  Surgeon: Mcarthur Rossetti, MD;  Location: [facility];  Service: Orthopedics;  Laterality: Left;  . TUBAL LIGATION    . ULNAR NERVE TRANSPOSITION     left   . UPPER GI ENDOSCOPY     x 2   Social History   Occupational History  . Not on file  Tobacco Use  . Smoking status: Current Every Day Smoker    Packs/day: 0.25    Years: 25.00    Pack years: 6.25    Types: Cigarettes  . Smokeless tobacco: Never Used  . Tobacco comment: Pt reports that she is in the process of quitting  Vaping Use  . Vaping Use: Never used  Substance and Sexual Activity  . Alcohol use: Yes    Comment: occasional beer   . Drug use: No    Comment: hx of 34 years ago marijuana   . Sexual activity: Not on file

## 2020-01-06 NOTE — Telephone Encounter (Signed)
Spoke with patient to review her medications and to explain a lumbar myelogram procedure.  She states an understanding she will be here two hours, will need a driver and will need to be on strict bedrest (explained) for 24 hour after the procedure.  She has not started the Elavil/Amitriptyline yet that was just prescribed for her, so I asked her not to start it until after the myelogram was completed.

## 2020-01-11 ENCOUNTER — Ambulatory Visit
Admission: RE | Admit: 2020-01-11 | Discharge: 2020-01-11 | Disposition: A | Discharge status: Home / Self Care | Payer: Medicare Other | Source: Ambulatory Visit | Attending: Specialist | Admitting: Specialist

## 2020-01-11 VITALS — BP 170/83 | HR 94

## 2020-01-11 DIAGNOSIS — M542 Cervicalgia: Secondary | ICD-10-CM

## 2020-01-11 DIAGNOSIS — M4722 Other spondylosis with radiculopathy, cervical region: Secondary | ICD-10-CM

## 2020-01-11 DIAGNOSIS — M5412 Radiculopathy, cervical region: Secondary | ICD-10-CM

## 2020-01-11 DIAGNOSIS — M4802 Spinal stenosis, cervical region: Secondary | ICD-10-CM | POA: Diagnosis not present

## 2020-01-11 DIAGNOSIS — M5030 Other cervical disc degeneration, unspecified cervical region: Secondary | ICD-10-CM | POA: Diagnosis not present

## 2020-01-11 MED ORDER — DIAZEPAM 5 MG PO TABS
10.0000 mg | ORAL_TABLET | Freq: Once | ORAL | Status: AC
  Administered 2020-01-11: 10 mg via ORAL

## 2020-01-11 MED ORDER — MEPERIDINE HCL 100 MG/ML IJ SOLN
75.0000 mg | Freq: Once | INTRAMUSCULAR | Status: AC
  Administered 2020-01-11: 75 mg via INTRAMUSCULAR

## 2020-01-11 MED ORDER — IOPAMIDOL (ISOVUE-M 300) INJECTION 61%
10.0000 mL | Freq: Once | INTRAMUSCULAR | Status: AC | PRN
  Administered 2020-01-11: 10 mL via INTRATHECAL

## 2020-01-11 MED ORDER — ONDANSETRON HCL 4 MG/2ML IJ SOLN
4.0000 mg | Freq: Once | INTRAMUSCULAR | Status: AC
  Administered 2020-01-11: 4 mg via INTRAMUSCULAR

## 2020-01-11 NOTE — Progress Notes (Signed)
Patient states she has not started taking Elavil yet and states an understanding she may start this medication tomorrow after 1030.

## 2020-01-11 NOTE — Discharge Instructions (Signed)
Myelogram Discharge Instructions  1. Go home and rest quietly for the next 24 hours.  It is important to lie flat for the next 24 hours.  Get up only to go to the restroom.  You may lie in the bed or on a couch on your back, your stomach, your left side or your right side.  You may have one pillow under your head.  You may have pillows between your knees while you are on your side or under your knees while you are on your back.  2. DO NOT drive today.  Recline the seat as far back as it will go, while still wearing your seat belt, on the way home.  3. You may get up to go to the bathroom as needed.  You may sit up for 10 minutes to eat.  You may resume your normal diet and medications unless otherwise indicated.  Drink lots of extra fluids today and tomorrow.  4. The incidence of headache, nausea, or vomiting is about 5% (one in 20 patients).  If you develop a headache, lie flat and drink plenty of fluids until the headache goes away.  Caffeinated beverages may be helpful.  If you develop severe nausea and vomiting or a headache that does not go away with flat bed rest, call [phone].  5. You may resume normal activities after your 24 hours of bed rest is over; however, do not exert yourself strongly or do any heavy lifting tomorrow. If when you get up you have a headache when standing, go back to bed and force fluids for another 24 hours.  6. Call your physician for a follow-up appointment.  The results of your myelogram will be sent directly to your physician by the following day.  7. If you have any questions or if complications develop after you arrive home, please call [phone].  Discharge instructions have been explained to the patient.  The patient, or the person responsible for the patient, fully understands these instructions.   YOU MAY START YOUR ELAVIL TOMORROW 01/12/20 AFTER 10:30AM.

## 2020-02-08 ENCOUNTER — Other Ambulatory Visit: Payer: Self-pay

## 2020-02-08 ENCOUNTER — Encounter: Payer: Self-pay | Admitting: Specialist

## 2020-02-08 ENCOUNTER — Ambulatory Visit: Payer: Medicare Other | Admitting: Specialist

## 2020-02-08 VITALS — BP 149/83 | HR 109 | Ht 66.0 in | Wt 218.2 lb

## 2020-02-08 DIAGNOSIS — M5412 Radiculopathy, cervical region: Secondary | ICD-10-CM | POA: Diagnosis not present

## 2020-02-08 DIAGNOSIS — M4722 Other spondylosis with radiculopathy, cervical region: Secondary | ICD-10-CM | POA: Diagnosis not present

## 2020-02-08 DIAGNOSIS — M25511 Pain in right shoulder: Secondary | ICD-10-CM

## 2020-02-08 NOTE — Progress Notes (Signed)
Office Visit Note   Patient: Katrina [name]           Date of Birth: 1955-05-30           MRN: [id number] Visit Date: 02/08/2020              Requested by: Eunice Blase, MD [address] [place],  [place] [address] PCP: Eunice Blase, MD   Assessment & Plan: Visit Diagnoses:  1. Cervical radiculopathy   2. Other spondylosis with radiculopathy, cervical region   3. Right shoulder pain, unspecified chronicity     Plan: Avoid overhead lifting and overhead use of the arms. Do not lift greater than 5 lbs. Adjust head rest in vehicle to prevent hyperextension if rear ended. Take extra precautions to avoid falling, including use of a cane if you feel weak. Scheduling secretary Kandice Hams. will call you to arrange for surgery for your cervical spine.  If you have worsening arm or leg numbness or weakness please call or go to an ER.  Surgery will be a right posterior cervical foraminotomy at the C5-6  level with decompression of the C6 nerve and removal of bone overlying the right  C6 nerve,  OR microscope is used for this procedure.  Risks of surgery include risks of infection, bleeding and risks to the spinal cord and  Risks of sore throat and difficulty swallowing which should  Improve over the next 4-6 weeks following surgery. Surgery is indicated due to upper extremity radiculopathy,spurlings phenomena. In the future surgery at adjacent levels may be necessary but these levels do not appear to be related to your current symptoms or signs.     Follow-Up Instructions: No follow-ups on file.   Orders:  No orders of the defined types were placed in this encounter.  No orders of the defined types were placed in this encounter.     Procedures: No procedures performed   Clinical Data: Findings:  CLINICAL DATA: Cervical radiculopathy. Neck pain radiating into the right upper extremity with involvement of the right first through third digits.  EXAM: CERVICAL  MYELOGRAM  CT CERVICAL MYELOGRAM  FLUOROSCOPY TIME: Fluoroscopy Time: 43 seconds  Radiation Exposure Index: 138.60 microGray*m^2  PROCEDURE: LUMBAR PUNCTURE FOR CERVICAL MYELOGRAM  After thorough discussion of risks and benefits of the procedure including bleeding, infection, injury to nerves, blood vessels, adjacent structures as well as headache and CSF leak, written and oral informed consent was obtained. Consent was obtained by Dr. Logan Bores. We discussed the high likelihood of obtaining a diagnostic study.  Patient was positioned prone on the fluoroscopy table. Local anesthesia was provided with 1% lidocaine without epinephrine after prepped and draped in the usual sterile fashion. Puncture was performed at L4-5 using a 3 1/2 inch 22-gauge spinal needle via a right interlaminar approach. Using a single pass through the dura, the needle was placed within the thecal sac, with return of clear CSF. 10 mL of Isovue M-300 was injected into the thecal sac, with normal opacification of the nerve roots and cauda equina consistent with free flow within the subarachnoid space. The patient was then moved to the Trendelenburg position and contrast flowed into the cervical spine region.  I personally performed the lumbar puncture and administered the intrathecal contrast. I also personally supervised acquisition of the myelogram images.  TECHNIQUE: Contiguous axial images were obtained through the cervical spine after the intrathecal infusion of contrast. Coronal and sagittal reconstructions were obtained of the axial image sets.  COMPARISON: Cervical spine  MRI 10/04/2019  FINDINGS: CERVICAL MYELOGRAM FINDINGS:  There are ventral extradural defects from C3-4 to C5-6 with at most mild spinal stenosis. There is partial effacement of the C6 nerve root sleeves bilaterally and of the left C7 nerve root sleeve.  CT CERVICAL MYELOGRAM FINDINGS:  There is straightening of the  normal cervical lordosis without listhesis. No fracture or suspicious osseous lesion is identified. Disc space narrowing is mild at C4-5 and C5-6 and moderate at C6-7. Degenerative endplate sclerosis and spurring are noted at C5-6 and C6-7.  Mild centrilobular emphysema is noted in the lung apices. There is a 5 mm peripherally calcified nodule in the right thyroid lobe which is considered clinically insignificant and for which no imaging follow-up is recommended. Mild calcified plaque is noted at the carotid bifurcations.  C2-3: Negative.  C3-4: Mild right facet arthrosis and at most minimal disc bulging without stenosis, unchanged.  C4-5: Disc bulging, uncovertebral spurring, and mild right facet arthrosis result in mild spinal stenosis without cord compression or neural foraminal stenosis, unchanged.  C5-6: Disc bulging, uncovertebral spurring, and mild facet arthrosis result in mild spinal stenosis and mild-to-moderate right and moderate left neural foraminal stenosis, unchanged. There is a punctate focus of gas anteroinferiorly in the right neural foramen which is likely arising from the disc space, however a soft disc protrusion is not evident.  C6-7: Disc bulging, left greater than right uncovertebral spurring, and mild right and moderate left facet arthrosis result in mild left neural foraminal stenosis without spinal stenosis, unchanged.  C7-T1: Moderate to severe left greater than right facet arthrosis result in mild left neural foraminal stenosis, unchanged. No spinal stenosis.  IMPRESSION: 1. Unchanged cervical disc degeneration, most notable at C5-6 where there is mild spinal stenosis and mild-to-moderate right and moderate left neural foraminal stenosis. 2. Mild spinal stenosis at C4-5. 3. Mild left neural foraminal stenosis at C6-7 and C7-T1. 4. Emphysema (ICD10-J43.9).   Electronically Signed By: Logan Bores M.D. On: 01/11/2020  14:55    Subjective: Chief Complaint  Patient presents with  . Neck - Follow-up    64 year old right handed female with right shoulder pain. She underwent a right elbow pronator release with good relief of pain into the right ulnar hand but over the last month has been having pain and grating or popping sensation into the right shoulder. The pain is present with raising the right arm. She reports that Artis Delay has giving her a shot in the right shoulder with good improvement in the right shoulder pain. She is seen today after myelogram of the cervical spine has been done and the report is available.    Review of Systems  Constitutional: Negative.   HENT: Negative.   Eyes: Negative.   Respiratory: Negative.   Cardiovascular: Negative.   Gastrointestinal: Negative.   Endocrine: Negative.   Genitourinary: Negative.   Musculoskeletal: Negative.   Skin: Negative.   Allergic/Immunologic: Negative.   Neurological: Negative.   Hematological: Negative.   Psychiatric/Behavioral: Negative.      Objective: Vital Signs: BP (!) 149/83 (BP Location: Left Arm, Patient Position: Sitting)   Pulse (!) 109   Ht 5\' 6"  (1.676 m)   Wt 218 lb 3.2 oz (99 kg)   BMI 35.22 kg/m   Physical Exam Constitutional:      Appearance: She is well-developed.  HENT:     Head: Normocephalic and atraumatic.  Eyes:     Pupils: Pupils are equal, round, and reactive to light.  Pulmonary:  Effort: Pulmonary effort is normal.     Breath sounds: Normal breath sounds.  Abdominal:     General: Bowel sounds are normal.     Palpations: Abdomen is soft.  Musculoskeletal:        General: Normal range of motion.     Cervical back: Normal range of motion and neck supple.  Skin:    General: Skin is warm and dry.  Neurological:     Mental Status: She is alert and oriented to person, place, and time.  Psychiatric:        Behavior: Behavior normal.        Thought Content: Thought content normal.        Judgment:  Judgment normal.     Back Exam   Tenderness  The patient is experiencing tenderness in the cervical.  Comments:  Painful ROM of the neck      Specialty Comments:  No specialty comments available.  Imaging: No results found.   PMFS History: Patient Active Problem List   Diagnosis Date Noted  . Lung nodules 11/30/2019  . Neck pain 08/04/2019  . Acute pain of right shoulder 04/27/2019  . Neck mass 04/27/2019  . Postmenopausal bleeding 06/16/2018  . S/P hysterectomy 06/16/2018  . Chronic right shoulder pain 10/28/2016  . Impingement syndrome of right shoulder 10/28/2016  . Osteoarthritis of left knee 12/15/2014  . Status post total left knee replacement 12/15/2014  . Hand pain, left 04/21/2014  . Cat bite of finger 04/21/2014  . Asthma 04/21/2014  . Tobacco abuse 04/21/2014  . COPD (chronic obstructive pulmonary disease) ([place])   . Effusion of left knee joint 11/03/2013  . Arthritis of right knee 12/03/2012  . Degenerative arthritis of hip 05/02/2011   Past Medical History:  Diagnosis Date  . Anxiety   . Arthritis    knees, hips, elbows   . Asthma    hx of asthma 3/12- hospitalized   . COPD (chronic obstructive pulmonary disease) ([employer])   . Depression   . GERD (gastroesophageal reflux disease)    PRIOR TO HAVING GALLBLADDER REMOVED  . Headache   . Left bundle branch block 06/28/2010  . Lung infection 2015  . Lung infection   . volvulus     previous history of twisted intestines    Family History  Problem Relation Age of Onset  . Heart disease Mother     Past Surgical History:  Procedure Laterality Date  . BACK SURGERY     x2  . CARPAL TUNNEL RELEASE     left   . CESAREAN SECTION     x 2  . CHOLECYSTECTOMY    . COLONOSCOPY  10/2017  . DILATATION & CURETTAGE/HYSTEROSCOPY WITH MYOSURE N/A 12/10/2017   Procedure: DILATATION & CURETTAGE/HYSTEROSCOPY WITH MYOSURE;  Surgeon: Christophe Louis, MD;  Location: [facility];  Service: Gynecology;  Laterality: N/A;   POSSIBLE MYOMECTOMY VS POLYPECTOMY WITH MYOSURE  . DILATION AND CURETTAGE OF UTERUS    . ELBOW SURGERY    . JOINT REPLACEMENT    . KNEE ARTHROSCOPY     bilateral x 2   . KNEE ARTHROSCOPY Left 11/03/2013   Procedure: LEFT KNEE ARTHROSCOPY WITH DEBRIDEMENT, PARTIAL SYNOVECTOMY;  Surgeon: Mcarthur Rossetti, MD;  Location: [facility];  Service: Orthopedics;  Laterality: Left;  . LAPAROSCOPY    . left elbow surgery     . OTHER SURGICAL HISTORY     arthroswcopic surgery right knee   . OTHER SURGICAL HISTORY     arthroscopic  surgery left knee x 2   . OTHER SURGICAL HISTORY     bunionectomy right foot   . OTHER SURGICAL HISTORY     C Section x 2   . OTHER SURGICAL HISTORY     carpal tunnel on left   . RADIOLOGY WITH ANESTHESIA N/A 07/28/2019   Procedure: MRI WITH ANESTHESIA OF NECK SOFT TISSUE ONLY WITH AND WITHOUT CONTRAST;  Surgeon: Radiologist, Medication, MD;  Location: [facility];  Service: Radiology;  Laterality: N/A;  . RADIOLOGY WITH ANESTHESIA N/A 10/04/2019   Procedure: MRI WITH ANESTHESIA RIGHT SHOULDER WITHOUT CONTRAST,MRI OF CERVICAL SPINE WITHOUT CONTRAST;  Surgeon: Radiologist, Medication, MD;  Location: [facility];  Service: Radiology;  Laterality: N/A;  . right foot bunionectomy     . ROBOTIC ASSISTED TOTAL HYSTERECTOMY WITH BILATERAL SALPINGO OOPHERECTOMY Bilateral 06/16/2018   Procedure: XI ROBOTIC ASSISTED TOTAL HYSTERECTOMY WITH RIGHT SALPINGO OOPHORECTOMY;  Surgeon: Christophe Louis, MD;  Location[facility];  Service: Gynecology;  Laterality: Bilateral;  . TOTAL HIP ARTHROPLASTY  05/02/2011   Procedure: TOTAL HIP ARTHROPLASTY ANTERIOR APPROACH;  Surgeon: Mcarthur Rossetti, MD;  Location: [facility];  Service: Orthopedics;  Laterality: Left;  Left Total Hip Arthroplasty, Direct Anterior Approach  . TOTAL KNEE ARTHROPLASTY Right 12/03/2012   Procedure: RIGHT TOTAL KNEE ARTHROPLASTY;  Surgeon: Mcarthur Rossetti, MD;  Location: [facility];  Service: Orthopedics;  Laterality:  Right;  . TOTAL KNEE ARTHROPLASTY Left 12/15/2014   Procedure: LEFT TOTAL KNEE ARTHROPLASTY;  Surgeon: Mcarthur Rossetti, MD;  Location: [facility];  Service: Orthopedics;  Laterality: Left;  . TUBAL LIGATION    . ULNAR NERVE TRANSPOSITION     left   . UPPER GI ENDOSCOPY     x 2   Social History   Occupational History  . Not on file  Tobacco Use  . Smoking status: Current Every Day Smoker    Packs/day: 0.25    Years: 25.00    Pack years: 6.25    Types: Cigarettes  . Smokeless tobacco: Never Used  . Tobacco comment: Pt reports that she is in the process of quitting  Vaping Use  . Vaping Use: Never used  Substance and Sexual Activity  . Alcohol use: Yes    Comment: occasional beer   . Drug use: No    Comment: hx of 34 years ago marijuana   . Sexual activity: Not on file

## 2020-02-08 NOTE — Patient Instructions (Signed)
Plan: Avoid overhead lifting and overhead use of the arms. Do not lift greater than 5 lbs. Adjust head rest in vehicle to prevent hyperextension if rear ended. Take extra precautions to avoid falling, including use of a cane if you feel weak. Scheduling secretary Kandice Hams. will call you to arrange for surgery for your cervical spine.  If you have worsening arm or leg numbness or weakness please call or go to an ER.  Surgery will be a right posterior cervical foraminotomy at the C5-6  level with decompression of the C6 nerve and removal of bone overlying the right  C6 nerve,  OR microscope is used for this procedure.  Risks of surgery include risks of infection, bleeding and risks to the spinal cord and  Risks of sore throat and difficulty swallowing which should  Improve over the next 4-6 weeks following surgery. Surgery is indicated due to upper extremity radiculopathy,spurlings phenomena. In the future surgery at adjacent levels may be necessary but these levels do not appear to be related to your current symptoms or signs

## 2020-02-22 ENCOUNTER — Ambulatory Visit (INDEPENDENT_AMBULATORY_CARE_PROVIDER_SITE_OTHER): Payer: Medicare Other

## 2020-02-22 ENCOUNTER — Other Ambulatory Visit: Payer: Self-pay

## 2020-02-22 ENCOUNTER — Ambulatory Visit (HOSPITAL_COMMUNITY)
Admission: EM | Admit: 2020-02-22 | Discharge: 2020-02-22 | Disposition: A | Discharge status: Home / Self Care | Payer: Medicare Other | Attending: Family Medicine | Admitting: Family Medicine

## 2020-02-22 ENCOUNTER — Encounter (HOSPITAL_COMMUNITY): Payer: Self-pay | Admitting: Emergency Medicine

## 2020-02-22 DIAGNOSIS — M79642 Pain in left hand: Secondary | ICD-10-CM | POA: Diagnosis not present

## 2020-02-22 DIAGNOSIS — S6992XA Unspecified injury of left wrist, hand and finger(s), initial encounter: Secondary | ICD-10-CM | POA: Diagnosis not present

## 2020-02-22 DIAGNOSIS — R030 Elevated blood-pressure reading, without diagnosis of hypertension: Secondary | ICD-10-CM

## 2020-02-22 DIAGNOSIS — S62655A Nondisplaced fracture of medial phalanx of left ring finger, initial encounter for closed fracture: Secondary | ICD-10-CM | POA: Diagnosis not present

## 2020-02-22 MED ORDER — HYDROCODONE-ACETAMINOPHEN 5-325 MG PO TABS: 1.0000 | ORAL_TABLET | Freq: Four times a day (QID) | ORAL | 0 refills | Status: DC | PRN

## 2020-02-22 MED ORDER — HYDROCODONE-ACETAMINOPHEN 5-325 MG PO TABS
ORAL_TABLET | ORAL | Status: AC
  Filled 2020-02-22: qty 1

## 2020-02-22 MED ORDER — HYDROCODONE-ACETAMINOPHEN 5-325 MG PO TABS
1.0000 | ORAL_TABLET | Freq: Once | ORAL | Status: AC
  Administered 2020-02-22: 1 via ORAL

## 2020-02-22 NOTE — Discharge Instructions (Addendum)
Be aware, you have been prescribed pain medications that may cause drowsiness. While taking this medication, do not take any other medications containing acetaminophen (Tylenol). Do not combine with alcohol or other illicit drugs. Please do not drive, operate heavy machinery, or take part in activities that require making important decisions while on this medication as your judgement may be clouded.   Your blood pressure was noted to be elevated during your visit today. If you are currently taking medication for high blood pressure, please ensure you are taking this as directed. If you do not have a history of high blood pressure and your blood pressure remains persistently elevated, you may need to begin taking a medication at some point. You may return here within the next few days to recheck if unable to see your primary care provider or if do not have a one.  BP (!) 203/134 (BP Location: Left Arm)   Pulse (!) 112   Temp 98.3 F (36.8 C) (Oral)   Resp 19   SpO2 98%

## 2020-02-22 NOTE — ED Notes (Signed)
TC to ortho tech for Pt.

## 2020-02-22 NOTE — ED Triage Notes (Signed)
Pt presents with left hand pain after falling up steps earlier today.

## 2020-02-22 NOTE — Progress Notes (Signed)
Orthopedic Tech Progress Note Patient Details:  Katrina [name] Aug 23, 1955 [id number]  Ortho Devices Type of Ortho Device: Ulna gutter splint Ortho Device/Splint Location: LUE Ortho Device/Splint Interventions: Application, Ordered   Post Interventions Patient Tolerated: Well Instructions Provided: Care of device, Adjustment of device, Poper ambulation with device   Chimaobi Casebolt A [name] 02/22/2020, 6:52 PM

## 2020-02-24 DIAGNOSIS — M25642 Stiffness of left hand, not elsewhere classified: Secondary | ICD-10-CM | POA: Diagnosis not present

## 2020-02-24 DIAGNOSIS — M79645 Pain in left finger(s): Secondary | ICD-10-CM | POA: Insufficient documentation

## 2020-02-25 NOTE — ED Provider Notes (Signed)
[place]   [id number] 02/22/20 Arrival Time: 2025  ASSESSMENT & PLAN:  1. Nondisplaced fracture of middle phalanx of left ring finger, initial encounter for closed fracture   2. Elevated blood pressure reading without diagnosis of hypertension     I have personally viewed the imaging studies ordered this visit. No fractures appreciated.   Meds ordered this encounter  Medications  . HYDROcodone-acetaminophen (NORCO/VICODIN) 5-325 MG per tablet 1 tablet  . HYDROcodone-acetaminophen (NORCO/VICODIN) 5-325 MG tablet    Sig: Take 1 tablet by mouth every 6 (six) hours as needed for severe pain.    Dispense:  12 tablet    Refill:  0    Orders Placed This Encounter  Procedures  . DG Hand Complete Left  . Apply splint short arm    Recommend:  Follow-up Information    Schedule an appointment as soon as possible for a visit  with Roseanne Kaufman, MD.   Specialty: Orthopedic Surgery Contact information: [address] [place] [address] [phone]                Discharge Instructions     Be aware, you have been prescribed pain medications that may cause drowsiness. While taking this medication, do not take any other medications containing acetaminophen (Tylenol). Do not combine with alcohol or other illicit drugs. Please do not drive, operate heavy machinery, or take part in activities that require making important decisions while on this medication as your judgement may be clouded.   Your blood pressure was noted to be elevated during your visit today. If you are currently taking medication for high blood pressure, please ensure you are taking this as directed. If you do not have a history of high blood pressure and your blood pressure remains persistently elevated, you may need to begin taking a medication at some point. You may return here within the next few days to recheck if unable to see your primary care provider or if do not have a  one.  BP (!) 203/134 (BP Location: Left Arm)   Pulse (!) 112   Temp 98.3 F (36.8 C) (Oral)   Resp 19   SpO2 98%        Rest the injured area as much as practical. [place] Controlled Substances Registry consulted for this patient. I feel the risk/benefit ratio today is favorable for proceeding with this prescription for a controlled substance. Medication sedation precautions given.  Reviewed expectations re: course of current medical issues. Questions answered. Outlined signs and symptoms indicating need for more acute intervention. Patient verbalized understanding. After Visit Summary given.  SUBJECTIVE:  History from: patient. Katrina [name] is a 64 y.o. female who reports persistent marked pain of her left hand; described as aching; without radiation. Onset: abrupt. First noted: today. Injury/trama: reports fall on steps with hand vs ground. Symptoms have progressed to a point and plateaued since beginning. Aggravating factors: certain movements. Alleviating factors: have not been identified. Associated symptoms: none reported. Extremity sensation changes or weakness: none. Self treatment: has not tried OTC therapies.  History of similar: no.  Increased blood pressure noted today. Reports that she is note treated for hypertension but has been told readings very high in the past. In significant pain here.  She reports no chest pain on exertion, no dyspnea on exertion, no swelling of ankles, no orthostatic dizziness or lightheadedness, no orthopnea or paroxysmal nocturnal dyspnea and no palpitations.  Past Surgical History:  Procedure Laterality Date  . BACK  SURGERY     x2  . CARPAL TUNNEL RELEASE     left   . CESAREAN SECTION     x 2  . CHOLECYSTECTOMY    . COLONOSCOPY  10/2017  . DILATATION & CURETTAGE/HYSTEROSCOPY WITH MYOSURE N/A 12/10/2017   Procedure: DILATATION & CURETTAGE/HYSTEROSCOPY WITH MYOSURE;  Surgeon: Christophe Louis, MD;  Location: [facility];  Service:  Gynecology;  Laterality: N/A;  POSSIBLE MYOMECTOMY VS POLYPECTOMY WITH MYOSURE  . DILATION AND CURETTAGE OF UTERUS    . ELBOW SURGERY    . JOINT REPLACEMENT    . KNEE ARTHROSCOPY     bilateral x 2   . KNEE ARTHROSCOPY Left 11/03/2013   Procedure: LEFT KNEE ARTHROSCOPY WITH DEBRIDEMENT, PARTIAL SYNOVECTOMY;  Surgeon: Mcarthur Rossetti, MD;  Location: [facility];  Service: Orthopedics;  Laterality: Left;  . LAPAROSCOPY    . left elbow surgery     . OTHER SURGICAL HISTORY     arthroswcopic surgery right knee   . OTHER SURGICAL HISTORY     arthroscopic surgery left knee x 2   . OTHER SURGICAL HISTORY     bunionectomy right foot   . OTHER SURGICAL HISTORY     C Section x 2   . OTHER SURGICAL HISTORY     carpal tunnel on left   . RADIOLOGY WITH ANESTHESIA N/A 07/28/2019   Procedure: MRI WITH ANESTHESIA OF NECK SOFT TISSUE ONLY WITH AND WITHOUT CONTRAST;  Surgeon: Radiologist, Medication, MD;  Location: [facility];  Service: Radiology;  Laterality: N/A;  . RADIOLOGY WITH ANESTHESIA N/A 10/04/2019   Procedure: MRI WITH ANESTHESIA RIGHT SHOULDER WITHOUT CONTRAST,MRI OF CERVICAL SPINE WITHOUT CONTRAST;  Surgeon: Radiologist, Medication, MD;  Location: [facility];  Service: Radiology;  Laterality: N/A;  . right foot bunionectomy     . ROBOTIC ASSISTED TOTAL HYSTERECTOMY WITH BILATERAL SALPINGO OOPHERECTOMY Bilateral 06/16/2018   Procedure: XI ROBOTIC ASSISTED TOTAL HYSTERECTOMY WITH RIGHT SALPINGO OOPHORECTOMY;  Surgeon: Christophe Louis, MD;  Location: [facility];  Service: Gynecology;  Laterality: Bilateral;  . TOTAL HIP ARTHROPLASTY  05/02/2011   Procedure: TOTAL HIP ARTHROPLASTY ANTERIOR APPROACH;  Surgeon: Mcarthur Rossetti, MD;  Location: [facility];  Service: Orthopedics;  Laterality: Left;  Left Total Hip Arthroplasty, Direct Anterior Approach  . TOTAL KNEE ARTHROPLASTY Right 12/03/2012   Procedure: RIGHT TOTAL KNEE ARTHROPLASTY;  Surgeon: Mcarthur Rossetti, MD;  Location: [facility];   Service: Orthopedics;  Laterality: Right;  . TOTAL KNEE ARTHROPLASTY Left 12/15/2014   Procedure: LEFT TOTAL KNEE ARTHROPLASTY;  Surgeon: Mcarthur Rossetti, MD;  Location: [facility];  Service: Orthopedics;  Laterality: Left;  . TUBAL LIGATION    . ULNAR NERVE TRANSPOSITION     left   . UPPER GI ENDOSCOPY     x 2      OBJECTIVE:  Vitals:   02/22/20 1619  BP: (!) 203/134  Pulse: (!) 112  Resp: 19  Temp: 98.3 F (36.8 C)  TempSrc: Oral  SpO2: 98%    BP and tachycardia noted General appearance: alert; no distress; appears to be in much pain HEENT: [place]; AT Neck: supple with FROM Resp: unlabored respirations Extremities: . LUE: warm with well perfused appearance; poorly localized moderate tenderness over left dorsal hand; without gross deformities; swelling: minimal; bruising: none; wrist ROM: normal CV: brisk extremity capillary refill of LUE; 2+ radial pulse of LUE. Skin: warm and dry; no visible rashes Neurologic: gait normal; normal sensation and strength of LUE Psychological: alert and cooperative; normal mood  and affect  Imaging: DG Hand Complete Left  Result Date: 02/22/2020 CLINICAL DATA:  Trauma EXAM: LEFT HAND - COMPLETE 3+ VIEW COMPARISON:  None. FINDINGS: No acute bony abnormality. Specifically, no fracture, subluxation, or dislocation. No erosions or bone destruction. Soft tissues unremarkable. IMPRESSION: No acute bony abnormality. Electronically Signed   By: Rolm Baptise M.D.   On: 02/22/2020 17:16      Allergies  Allergen Reactions  . Codeine Itching and Nausea Only    Past Medical History:  Diagnosis Date  . Anxiety   . Arthritis    knees, hips, elbows   . Asthma    hx of asthma 3/12- hospitalized   . COPD (chronic obstructive pulmonary disease) ([place])   . Depression   . GERD (gastroesophageal reflux disease)    PRIOR TO HAVING GALLBLADDER REMOVED  . Headache   . Left bundle branch block 06/28/2010  . Lung infection 2015  . Lung infection   .  volvulus     previous history of twisted intestines   Social History   Socioeconomic History  . Marital status: Married    Spouse name: Not on file  . Number of children: Not on file  . Years of education: Not on file  . Highest education level: Not on file  Occupational History  . Not on file  Tobacco Use  . Smoking status: Current Every Day Smoker    Packs/day: 0.25    Years: 25.00    Pack years: 6.25    Types: Cigarettes  . Smokeless tobacco: Never Used  . Tobacco comment: Pt reports that she is in the process of quitting  Vaping Use  . Vaping Use: Never used  Substance and Sexual Activity  . Alcohol use: Yes    Comment: occasional beer   . Drug use: No    Comment: hx of 34 years ago marijuana   . Sexual activity: Not on file  Other Topics Concern  . Not on file  Social History Narrative  . Not on file   Social Determinants of Health   Financial Resource Strain:   . Difficulty of Paying Living Expenses: Not on file  Food Insecurity:   . Worried About Charity fundraiser in the Last Year: Not on file  . Ran Out of Food in the Last Year: Not on file  Transportation Needs:   . Lack of Transportation (Medical): Not on file  . Lack of Transportation (Non-Medical): Not on file  Physical Activity:   . Days of Exercise per Week: Not on file  . Minutes of Exercise per Session: Not on file  Stress:   . Feeling of Stress : Not on file  Social Connections:   . Frequency of Communication with Friends and Family: Not on file  . Frequency of Social Gatherings with Friends and Family: Not on file  . Attends Religious Services: Not on file  . Active Member of Clubs or Organizations: Not on file  . Attends Archivist Meetings: Not on file  . Marital Status: Not on file   Family History  Problem Relation Age of Onset  . Heart disease Mother    Past Surgical History:  Procedure Laterality Date  . BACK SURGERY     x2  . CARPAL TUNNEL RELEASE     left   .  CESAREAN SECTION     x 2  . CHOLECYSTECTOMY    . COLONOSCOPY  10/2017  . DILATATION & CURETTAGE/HYSTEROSCOPY WITH MYOSURE N/A 12/10/2017  Procedure: DILATATION & CURETTAGE/HYSTEROSCOPY WITH MYOSURE;  Surgeon: Christophe Louis, MD;  Location: [facility];  Service: Gynecology;  Laterality: N/A;  POSSIBLE MYOMECTOMY VS POLYPECTOMY WITH MYOSURE  . DILATION AND CURETTAGE OF UTERUS    . ELBOW SURGERY    . JOINT REPLACEMENT    . KNEE ARTHROSCOPY     bilateral x 2   . KNEE ARTHROSCOPY Left 11/03/2013   Procedure: LEFT KNEE ARTHROSCOPY WITH DEBRIDEMENT, PARTIAL SYNOVECTOMY;  Surgeon: Mcarthur Rossetti, MD;  Location: [facility];  Service: Orthopedics;  Laterality: Left;  . LAPAROSCOPY    . left elbow surgery     . OTHER SURGICAL HISTORY     arthroswcopic surgery right knee   . OTHER SURGICAL HISTORY     arthroscopic surgery left knee x 2   . OTHER SURGICAL HISTORY     bunionectomy right foot   . OTHER SURGICAL HISTORY     C Section x 2   . OTHER SURGICAL HISTORY     carpal tunnel on left   . RADIOLOGY WITH ANESTHESIA N/A 07/28/2019   Procedure: MRI WITH ANESTHESIA OF NECK SOFT TISSUE ONLY WITH AND WITHOUT CONTRAST;  Surgeon: Radiologist, Medication, MD;  Location: [facility];  Service: Radiology;  Laterality: N/A;  . RADIOLOGY WITH ANESTHESIA N/A 10/04/2019   Procedure: MRI WITH ANESTHESIA RIGHT SHOULDER WITHOUT CONTRAST,MRI OF CERVICAL SPINE WITHOUT CONTRAST;  Surgeon: Radiologist, Medication, MD;  Location: [facility];  Service: Radiology;  Laterality: N/A;  . right foot bunionectomy     . ROBOTIC ASSISTED TOTAL HYSTERECTOMY WITH BILATERAL SALPINGO OOPHERECTOMY Bilateral 06/16/2018   Procedure: XI ROBOTIC ASSISTED TOTAL HYSTERECTOMY WITH RIGHT SALPINGO OOPHORECTOMY;  Surgeon: Christophe Louis, MD;  Location: [facility];  Service: Gynecology;  Laterality: Bilateral;  . TOTAL HIP ARTHROPLASTY  05/02/2011   Procedure: TOTAL HIP ARTHROPLASTY ANTERIOR APPROACH;  Surgeon: Mcarthur Rossetti, MD;  Location:  [facility];  Service: Orthopedics;  Laterality: Left;  Left Total Hip Arthroplasty, Direct Anterior Approach  . TOTAL KNEE ARTHROPLASTY Right 12/03/2012   Procedure: RIGHT TOTAL KNEE ARTHROPLASTY;  Surgeon: Mcarthur Rossetti, MD;  Location: [facility];  Service: Orthopedics;  Laterality: Right;  . TOTAL KNEE ARTHROPLASTY Left 12/15/2014   Procedure: LEFT TOTAL KNEE ARTHROPLASTY;  Surgeon: Mcarthur Rossetti, MD;  Location: [facility];  Service: Orthopedics;  Laterality: Left;  . TUBAL LIGATION    . ULNAR NERVE TRANSPOSITION     left   . UPPER GI ENDOSCOPY     x 2      Vanessa Kick, MD 02/25/20 1003

## 2020-03-09 DIAGNOSIS — M79645 Pain in left finger(s): Secondary | ICD-10-CM | POA: Diagnosis not present

## 2020-03-21 ENCOUNTER — Other Ambulatory Visit: Payer: Self-pay

## 2020-03-26 ENCOUNTER — Telehealth: Payer: Self-pay | Admitting: Specialist

## 2020-03-26 NOTE — Telephone Encounter (Signed)
Wants to know if she can get the booster if she is about to have surgery Monday.

## 2020-03-26 NOTE — Telephone Encounter (Signed)
I called and advised that per JN she can get it, she states that she found out that she had to wait 6 months to get it and it is not till January as her last injection is July.

## 2020-03-28 ENCOUNTER — Ambulatory Visit (INDEPENDENT_AMBULATORY_CARE_PROVIDER_SITE_OTHER): Payer: Medicare Other | Admitting: Surgery

## 2020-03-28 ENCOUNTER — Encounter: Payer: Self-pay | Admitting: Surgery

## 2020-03-28 ENCOUNTER — Other Ambulatory Visit: Payer: Self-pay

## 2020-03-28 VITALS — BP 152/91 | HR 105 | Ht 67.0 in | Wt 215.6 lb

## 2020-03-28 DIAGNOSIS — M5412 Radiculopathy, cervical region: Secondary | ICD-10-CM

## 2020-03-28 NOTE — Progress Notes (Signed)
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 Symptoms unchanged from previous visit and she is wanting to proceed with right C5-6 foraminotomy as scheduled. Today history and physical performed. Review of systems patient admits to having dyspnea with "chimney smoke". She does have a history of COPD. Patient states that she has been previously advised what is involved with the surgical procedure. Plan for an overnight stay. All questions answered

## 2020-03-28 NOTE — Pre-Procedure Instructions (Signed)
Your procedure is scheduled on Mon., Dec. 27, 2021 from 8:30AM-.11:46AM  Report to [facility] Entrance "A" at 6:00AM  Call this number if you have problems the morning of surgery:  [phone]   Remember:  Do not eat or drink after midnight on Dec. 26th    Take these medicines the morning of surgery with A SIP OF WATER: If Needed: HYDROcodone-acetaminophen (NORCO/VICODIN) Albuterol (VENTOLIN HFA) Inhaler- bring with day of surgery  As of today, STOP taking all Aspirin (unless instructed by your doctor) and Other Aspirin containing products, Vitamins, Fish oils, and Herbal medications. Also stop all NSAIDS i.e. Advil, Ibuprofen, Motrin, Aleve, Anaprox, Naproxen, BC, Goody Powders, and all Supplements.    No Smoking of any kind, Tobacco/Vaping, or Alcohol products 24 hours prior to your procedure. If you use a Cpap at night, you may bring all equipment for your overnight stay.   Special instructions:   [place]- Preparing For Surgery  Before surgery, you can play an important role. Because skin is not sterile, your skin needs to be as free of germs as possible. You can reduce the number of germs on your skin by washing with CHG (chlorahexidine gluconate) Soap before surgery.  CHG is an antiseptic cleaner which kills germs and bonds with the skin to continue killing germs even after washing.    Please do not use if you have an allergy to CHG or antibacterial soaps. If your skin becomes reddened/irritated stop using the CHG.  Do not shave (including legs and underarms) for at least 48 hours prior to first CHG shower. It is OK to shave your face.  Please follow these instructions carefully.   1. Shower the NIGHT BEFORE SURGERY and the MORNING OF SURGERY with CHG.   2. If you chose to wash your hair, wash your hair first as usual with your normal shampoo.  3. After you shampoo, rinse your hair and body thoroughly to remove the shampoo.  4. Use CHG as you would any other  liquid soap. You can apply CHG directly to the skin and wash gently with a scrungie or a clean washcloth.   5. Apply the CHG Soap to your body ONLY FROM THE NECK DOWN.  Do not use on open wounds or open sores. Avoid contact with your eyes, ears, mouth and genitals (private parts). Wash Face and genitals (private parts)  with your normal soap.  6. Wash thoroughly, paying special attention to the area where your surgery will be performed.  7. Thoroughly rinse your body with warm water from the neck down.  8. DO NOT shower/wash with your normal soap after using and rinsing off the CHG Soap.  9. Pat yourself dry with a CLEAN TOWEL.  10. Wear CLEAN PAJAMAS to bed the night before surgery, wear comfortable clothes the morning of surgery  11. Place CLEAN SHEETS on your bed the night of your first shower and DO NOT SLEEP WITH PETS.   Day of Surgery:            Remember to brush your teeth WITH YOUR REGULAR TOOTHPASTE.  Do not wear jewelry, make-up or nail polish.  Do not wear lotions, powders, or perfumes, or deodorant.  Do not shave 48 hours prior to surgery.    Do not bring valuables to the hospital.  [facility] is not responsible for any belongings or valuables.  Contacts, dentures or bridgework may not be worn into surgery.    For patients admitted to the hospital,  discharge time will be determined by your treatment team.  Patients discharged the day of surgery will not be allowed to drive home, and someone age 35 and needs to stay with them for 24 hours.  Please wear clean clothes to the hospital/surgery center.    Please read over the following fact sheets that you were given.

## 2020-03-29 ENCOUNTER — Encounter (HOSPITAL_COMMUNITY): Payer: Self-pay

## 2020-03-29 ENCOUNTER — Other Ambulatory Visit (HOSPITAL_COMMUNITY)
Admission: RE | Admit: 2020-03-29 | Discharge: 2020-03-29 | Disposition: A | Discharge status: Home / Self Care | Payer: Medicare Other | Source: Ambulatory Visit | Attending: Specialist | Admitting: Specialist

## 2020-03-29 ENCOUNTER — Other Ambulatory Visit: Payer: Self-pay

## 2020-03-29 ENCOUNTER — Encounter (HOSPITAL_COMMUNITY)
Admission: RE | Admit: 2020-03-29 | Discharge: 2020-03-29 | Disposition: A | Discharge status: Home / Self Care | Payer: Medicare Other | Source: Ambulatory Visit | Attending: Specialist | Admitting: Specialist

## 2020-03-29 DIAGNOSIS — Z20822 Contact with and (suspected) exposure to covid-19: Secondary | ICD-10-CM | POA: Insufficient documentation

## 2020-03-29 DIAGNOSIS — Z01812 Encounter for preprocedural laboratory examination: Secondary | ICD-10-CM | POA: Insufficient documentation

## 2020-03-29 DIAGNOSIS — F17210 Nicotine dependence, cigarettes, uncomplicated: Secondary | ICD-10-CM | POA: Diagnosis not present

## 2020-03-29 DIAGNOSIS — M47022 Vertebral artery compression syndromes, cervical region: Secondary | ICD-10-CM | POA: Insufficient documentation

## 2020-03-29 DIAGNOSIS — J449 Chronic obstructive pulmonary disease, unspecified: Secondary | ICD-10-CM | POA: Diagnosis not present

## 2020-03-29 LAB — CBC
HCT: 46.6 % — ABNORMAL HIGH (ref 36.0–46.0)
Hemoglobin: 15.2 g/dL — ABNORMAL HIGH (ref 12.0–15.0)
MCH: 31.5 pg (ref 26.0–34.0)
MCHC: 32.6 g/dL (ref 30.0–36.0)
MCV: 96.5 fL (ref 80.0–100.0)
Platelets: 280 10*3/uL (ref 150–400)
RBC: 4.83 MIL/uL (ref 3.87–5.11)
RDW: 13.2 % (ref 11.5–15.5)
WBC: 9.4 10*3/uL (ref 4.0–10.5)
nRBC: 0 % (ref 0.0–0.2)

## 2020-03-29 LAB — BASIC METABOLIC PANEL
Anion gap: 10 (ref 5–15)
BUN: 10 mg/dL (ref 8–23)
CO2: 25 mmol/L (ref 22–32)
Calcium: 8.9 mg/dL (ref 8.9–10.3)
Chloride: 106 mmol/L (ref 98–111)
Creatinine, Ser: 0.79 mg/dL (ref 0.44–1.00)
GFR, Estimated: >60 mL/min (ref >60)
Glucose, Bld: 108 mg/dL — ABNORMAL HIGH (ref 70–99)
Potassium: 3.5 mmol/L (ref 3.5–5.1)
Sodium: 141 mmol/L (ref 135–145)

## 2020-03-29 LAB — TYPE AND SCREEN
ABO/RH(D): O POS
Antibody Screen: NEGATIVE

## 2020-03-29 LAB — SARS CORONAVIRUS 2 (TAT 6-24 HRS): SARS Coronavirus 2: NEGATIVE

## 2020-03-29 LAB — SURGICAL PCR SCREEN
MRSA, PCR: NEGATIVE
Staphylococcus aureus: NEGATIVE

## 2020-03-29 NOTE — Progress Notes (Signed)
Anesthesia Chart Review:  Case: [id number] Date/Time: 04/02/20 0815   Procedure: RIGHT C5-6 FORAMINOTOMY (N/A )   Anesthesia type: General   Pre-op diagnosis: cervical spondylosis right C5-6   Location: [facility] OR ROOM 05 / [name]   Surgeons: Katrina Oto, MD      DISCUSSION: Patient is a 64 year old female scheduled for the above procedure.   She is s/p MRI neck soft tissue under MAC anesthesia on 07/28/19 (showed sinus inflammation but no mass). Now she is scheduled for MRI of the right shoulder ordered by Katrina Dess, MD with H&P completed on 10/03/19.   History includes smoking, COPD, GERD, asthma, volvulus, left BBB (present since at least 06/28/10), hysterectomy (06/16/18), TKA (left 2016, right 2014), left THA (2013), back surgery, cholecystectomy. Reported alcohol as "occasional beer".She is s/p right cubital tunnel release 06/02/19 ([name]).  Right lung nodules noted on 10/26/19 chest CT. Dr. [name] wrote on 11/09/19, plan for follow-up CT chest 3-6 months.   LBBB since at least 2012, although she denied prior cardiac testing. She has undergone several procedures under anesthesia since then, one as recent as 06/02/19 and anesthesia for MRIs in April & June 2021. She is a smoker, but denied chest pain and SOB per PAT RN phone interview.  No reported HTN, but BP elevated with VS. Vitals and anesthesia team evaluation on the day of procedure.   Preprocedure COVID-19 test is scheduled for 03/29/20.    VS: BP (!) 171/95   Pulse (!) 101   Temp (!) 36.3 C (Oral)   Resp 18   Ht _0  (1.702 m)   Wt 97.1 kg   SpO2 98%   BMI 33.52 kg/m  HR 90 bpm on EKG.   PROVIDERS: Katrina Blase, MD is PCP  Katrina Gala, MD is ENT   LABS: Labs reviewed: Acceptable for surgery. (all labs ordered are listed, but only abnormal results are displayed)  Labs Reviewed  CBC - Abnormal; Notable for the following components:      Result Value   Hemoglobin 15.2 (*)    HCT 46.6 (*)    All  other components within normal limits  BASIC METABOLIC PANEL - Abnormal; Notable for the following components:   Glucose, Bld 108 (*)    All other components within normal limits  SURGICAL PCR SCREEN  TYPE AND SCREEN    IMAGES: CT C-spine 01/11/20: IMPRESSION: 1. Unchanged cervical disc degeneration, most notable at C5-6 where there is mild spinal stenosis and mild-to-moderate right and moderate left neural foraminal stenosis. 2. Mild spinal stenosis at C4-5. 3. Mild left neural foraminal stenosis at C6-7 and C7-T1. 4. Emphysema (ICD10-J43.9).  CT Chest 10/26/19 (ordered by Dr. [name]): IMPRESSION: 1. Patient reportedly placed a radiopaque marker at the site of palpable concern which is not visualized within the included level of imaging and typical coverage for a CT examination. Possibly above the superior margins of imaging or displaced with arm elevation for the scanning technique. No visible concerning soft tissue lesions or collections are seen in the right suprascapular region as included nor elsewhere about the right shoulder or chest wall. 2. A 9 mm solitary focal ground-glass opacities in the lateral basal segment right lower lobe. Additional subpleural 5 mm solid nodule in the anterior right upper lobe. Non-contrast chest CT at 3-6 months is recommended. If nodules persist, subsequent management will be based upon the most suspicious nodule(s). This recommendation follows the consensus statement: Guidelines for Management of Incidental Pulmonary Nodules Detected  on CT Images: From the Fleischner Society 2017; Radiology 2017; 284:228-243. 3. Emphysema (ICD10-J43.9) and bronchitic features, may correlate with smoking related changes. 4. Aortic Atherosclerosis (ICD10-I70.0)   EKG: 03/29/20: Normal sinus rhythm Left axis deviation Left bundle branch block Abnormal ECG - She had SR, LAD, LBBB on EKGs dating back to at least 06/28/10 (and admission note for COPD  exacerbation then mentions "prior left bundle-branch block on EKG."   CV: She denied prior stress, echo, cath.   Past Medical History:  Diagnosis Date  . Anxiety   . Arthritis    knees, hips, elbows   . Asthma    hx of asthma 3/12- hospitalized   . COPD (chronic obstructive pulmonary disease) ([name])   . Depression   . GERD (gastroesophageal reflux disease)    PRIOR TO HAVING GALLBLADDER REMOVED  . Headache   . Left bundle branch block 06/28/2010  . Lung infection 2015  . Lung infection   . volvulus     previous history of twisted intestines    Past Surgical History:  Procedure Laterality Date  . BACK SURGERY     x2  . CARPAL TUNNEL RELEASE     left   . CESAREAN SECTION     x 2  . CHOLECYSTECTOMY    . COLONOSCOPY  10/2017  . DILATATION & CURETTAGE/HYSTEROSCOPY WITH MYOSURE N/A 12/10/2017   Procedure: DILATATION & CURETTAGE/HYSTEROSCOPY WITH MYOSURE;  Surgeon: Katrina Louis, MD;  Location: [name];  Service: Gynecology;  Laterality: N/A;  POSSIBLE MYOMECTOMY VS POLYPECTOMY WITH MYOSURE  . DILATION AND CURETTAGE OF UTERUS    . ELBOW SURGERY    . JOINT REPLACEMENT    . KNEE ARTHROSCOPY     bilateral x 2   . KNEE ARTHROSCOPY Left 11/03/2013   Procedure: LEFT KNEE ARTHROSCOPY WITH DEBRIDEMENT, PARTIAL SYNOVECTOMY;  Surgeon: Katrina Rossetti, MD;  Location: [name];  Service: Orthopedics;  Laterality: Left;  . LAPAROSCOPY    . left elbow surgery     . OTHER SURGICAL HISTORY     arthroswcopic surgery right knee   . OTHER SURGICAL HISTORY     arthroscopic surgery left knee x 2   . OTHER SURGICAL HISTORY     bunionectomy right foot   . OTHER SURGICAL HISTORY     C Section x 2   . OTHER SURGICAL HISTORY     carpal tunnel on left   . RADIOLOGY WITH ANESTHESIA N/A 07/28/2019   Procedure: MRI WITH ANESTHESIA OF NECK SOFT TISSUE ONLY WITH AND WITHOUT CONTRAST;  Surgeon: Radiologist, Medication, MD;  Location: [name];  Service: Radiology;  Laterality: N/A;  . RADIOLOGY WITH  ANESTHESIA N/A 10/04/2019   Procedure: MRI WITH ANESTHESIA RIGHT SHOULDER WITHOUT CONTRAST,MRI OF CERVICAL SPINE WITHOUT CONTRAST;  Surgeon: Radiologist, Medication, MD;  Location: [name];  Service: Radiology;  Laterality: N/A;  . right foot bunionectomy     . ROBOTIC ASSISTED TOTAL HYSTERECTOMY WITH BILATERAL SALPINGO OOPHERECTOMY Bilateral 06/16/2018   Procedure: XI ROBOTIC ASSISTED TOTAL HYSTERECTOMY WITH RIGHT SALPINGO OOPHORECTOMY;  Surgeon: Katrina Louis, MD;  Location: [facility];  Service: Gynecology;  Laterality: Bilateral;  . TOTAL HIP ARTHROPLASTY  05/02/2011   Procedure: TOTAL HIP ARTHROPLASTY ANTERIOR APPROACH;  Surgeon: Katrina Rossetti, MD;  Location: [name];  Service: Orthopedics;  Laterality: Left;  Left Total Hip Arthroplasty, Direct Anterior Approach  . TOTAL KNEE ARTHROPLASTY Right 12/03/2012   Procedure: RIGHT TOTAL KNEE ARTHROPLASTY;  Surgeon: Katrina Rossetti, MD;  Location: [name];  Service: Orthopedics;  Laterality: Right;  . TOTAL KNEE ARTHROPLASTY Left 12/15/2014   Procedure: LEFT TOTAL KNEE ARTHROPLASTY;  Surgeon: Katrina Rossetti, MD;  Location: [name];  Service: Orthopedics;  Laterality: Left;  . TUBAL LIGATION    . ULNAR NERVE TRANSPOSITION     left   . UPPER GI ENDOSCOPY     x 2    MEDICATIONS: . albuterol (VENTOLIN HFA) 108 (90 Base) MCG/ACT inhaler  . fluticasone (FLOVENT HFA) 44 MCG/ACT inhaler  . HYDROcodone-acetaminophen (NORCO/VICODIN) 5-325 MG tablet   No current facility-administered medications for this encounter.    Myra Gianotti, PA-C Surgical Short Stay/Anesthesiology [facility] Phone [phone] [facility] Phone [phone] 03/29/2020 1:02 PM

## 2020-03-29 NOTE — Progress Notes (Addendum)
PCP:  Eunice Blase, MD Cardiologist:  Denies  EKG:  03/29/20 CXR:  N/A ECHO:  Denies Stress Test:  Denies Cardiac Cath:  Denies  Fasting Blood Sugar-  N/A Checks Blood Sugar_N/A__ times a day  OSA/CPAP:  NO ASA/Blood Thinners: No  Covid test 03/29/20  Anesthesia Review:  Yes, abnormal EKG  Patient denies shortness of breath, fever, cough, and chest pain at PAT appointment.  Patient verbalized understanding of instructions provided today at the PAT appointment.  Patient asked to review instructions at home and day of surgery.

## 2020-03-29 NOTE — Anesthesia Preprocedure Evaluation (Addendum)
Anesthesia Evaluation  Patient identified by MRN, date of birth, ID band Patient awake    Reviewed: Allergy & Precautions, NPO status , Patient's Chart, lab work & pertinent test results  History of Anesthesia Complications Negative for: history of anesthetic complications  Airway Mallampati: II  TM Distance: >3 FB Neck ROM: Full    Dental  (+) Poor Dentition, Chipped, Missing, Dental Advisory Given   Pulmonary COPD,  COPD inhaler, Current SmokerPatient did not abstain from smoking.,  03/29/2020 SARS coronavirus NEG   breath sounds clear to auscultation       Cardiovascular negative cardio ROS   Rhythm:Regular Rate:Normal     Neuro/Psych  Headaches,    GI/Hepatic negative GI ROS, Neg liver ROS,   Endo/Other  Morbid obesity  Renal/GU negative Renal ROS     Musculoskeletal   Abdominal (+) + obese,   Peds  Hematology negative hematology ROS (+)   Anesthesia Other Findings   Reproductive/Obstetrics                           Anesthesia Physical Anesthesia Plan  ASA: III  Anesthesia Plan: General   Post-op Pain Management:    Induction: Intravenous  PONV Risk Score and Plan: 3 and Ondansetron, Dexamethasone and Scopolamine patch - Pre-op  Airway Management Planned: Oral ETT  Additional Equipment: None  Intra-op Plan:   Post-operative Plan: Extubation in OR  Informed Consent: I have reviewed the patients History and Physical, chart, labs and discussed the procedure including the risks, benefits and alternatives for the proposed anesthesia with the patient or authorized representative who has indicated his/her understanding and acceptance.     Dental advisory given  Plan Discussed with: CRNA and Surgeon  Anesthesia Plan Comments: (PAT note written 03/29/2020 by Myra Gianotti, PA-C. Chronic LBBB. )      Anesthesia Quick Evaluation

## 2020-04-02 ENCOUNTER — Encounter (HOSPITAL_COMMUNITY): Payer: Self-pay | Admitting: Specialist

## 2020-04-02 ENCOUNTER — Ambulatory Visit (HOSPITAL_COMMUNITY): Payer: Medicare Other | Admitting: Anesthesiology

## 2020-04-02 ENCOUNTER — Ambulatory Visit (HOSPITAL_COMMUNITY): Payer: Medicare Other | Admitting: Vascular Surgery

## 2020-04-02 ENCOUNTER — Observation Stay (HOSPITAL_COMMUNITY)
Admission: RE | Admit: 2020-04-02 | Discharge: 2020-04-03 | Disposition: A | Discharge status: Home / Self Care | Payer: Medicare Other | Attending: Specialist | Admitting: Specialist

## 2020-04-02 ENCOUNTER — Ambulatory Visit (HOSPITAL_COMMUNITY): Payer: Medicare Other

## 2020-04-02 ENCOUNTER — Ambulatory Visit (HOSPITAL_COMMUNITY)
Admission: RE | Disposition: A | Discharge status: Home / Self Care | Payer: Self-pay | Source: Home / Self Care | Attending: Specialist

## 2020-04-02 DIAGNOSIS — M4722 Other spondylosis with radiculopathy, cervical region: Principal | ICD-10-CM | POA: Insufficient documentation

## 2020-04-02 DIAGNOSIS — Z96653 Presence of artificial knee joint, bilateral: Secondary | ICD-10-CM | POA: Insufficient documentation

## 2020-04-02 DIAGNOSIS — J45909 Unspecified asthma, uncomplicated: Secondary | ICD-10-CM | POA: Insufficient documentation

## 2020-04-02 DIAGNOSIS — Z9889 Other specified postprocedural states: Secondary | ICD-10-CM | POA: Diagnosis not present

## 2020-04-02 DIAGNOSIS — I447 Left bundle-branch block, unspecified: Secondary | ICD-10-CM | POA: Diagnosis not present

## 2020-04-02 DIAGNOSIS — J449 Chronic obstructive pulmonary disease, unspecified: Secondary | ICD-10-CM | POA: Diagnosis not present

## 2020-04-02 DIAGNOSIS — Z7951 Long term (current) use of inhaled steroids: Secondary | ICD-10-CM | POA: Insufficient documentation

## 2020-04-02 DIAGNOSIS — Z419 Encounter for procedure for purposes other than remedying health state, unspecified: Secondary | ICD-10-CM

## 2020-04-02 DIAGNOSIS — F418 Other specified anxiety disorders: Secondary | ICD-10-CM | POA: Diagnosis not present

## 2020-04-02 HISTORY — PX: POSTERIOR CERVICAL FUSION/FORAMINOTOMY: SHX5038

## 2020-04-02 SURGERY — POSTERIOR CERVICAL FUSION/FORAMINOTOMY LEVEL 1
Anesthesia: General

## 2020-04-02 MED ORDER — BUDESONIDE 0.25 MG/2ML IN SUSP
0.2500 mg | Freq: Two times a day (BID) | RESPIRATORY_TRACT | Status: DC
  Filled 2020-04-02 (×4): qty 2

## 2020-04-02 MED ORDER — ACETAMINOPHEN 500 MG PO TABS
ORAL_TABLET | ORAL | Status: AC
  Administered 2020-04-02: 1000 mg via ORAL
  Filled 2020-04-02: qty 2

## 2020-04-02 MED ORDER — ACETAMINOPHEN 500 MG PO TABS: 1000.0000 mg | ORAL_TABLET | Freq: Once | ORAL | Status: AC

## 2020-04-02 MED ORDER — ONDANSETRON HCL 4 MG PO TABS: 4.0000 mg | ORAL_TABLET | Freq: Four times a day (QID) | ORAL | Status: DC | PRN

## 2020-04-02 MED ORDER — SCOPOLAMINE 1 MG/3DAYS TD PT72
MEDICATED_PATCH | TRANSDERMAL | Status: AC
  Administered 2020-04-02: 1.5 mg via TRANSDERMAL
  Filled 2020-04-02: qty 1

## 2020-04-02 MED ORDER — ACETAMINOPHEN 650 MG RE SUPP: 650.0000 mg | RECTAL | Status: DC | PRN

## 2020-04-02 MED ORDER — SODIUM CHLORIDE 0.9% FLUSH
3.0000 mL | Freq: Two times a day (BID) | INTRAVENOUS | Status: DC
  Administered 2020-04-02: 3 mL via INTRAVENOUS

## 2020-04-02 MED ORDER — HYDROCODONE-ACETAMINOPHEN 10-325 MG PO TABS
1.0000 | ORAL_TABLET | ORAL | Status: DC | PRN
  Administered 2020-04-02 – 2020-04-03 (×2): 1 via ORAL
  Filled 2020-04-02 (×2): qty 1

## 2020-04-02 MED ORDER — HYDROCODONE-ACETAMINOPHEN 7.5-325 MG PO TABS
1.0000 | ORAL_TABLET | Freq: Four times a day (QID) | ORAL | Status: DC
  Administered 2020-04-02 – 2020-04-03 (×3): 1 via ORAL
  Filled 2020-04-02 (×3): qty 1

## 2020-04-02 MED ORDER — SUGAMMADEX SODIUM 200 MG/2ML IV SOLN
INTRAVENOUS | Status: DC | PRN
  Administered 2020-04-02: 200 mg via INTRAVENOUS

## 2020-04-02 MED ORDER — SODIUM CHLORIDE 0.9 % IV SOLN: INTRAVENOUS | Status: DC

## 2020-04-02 MED ORDER — ACETAMINOPHEN 325 MG PO TABS: 650.0000 mg | ORAL_TABLET | ORAL | Status: DC | PRN

## 2020-04-02 MED ORDER — ONDANSETRON HCL 4 MG/2ML IJ SOLN
INTRAMUSCULAR | Status: DC | PRN
  Administered 2020-04-02: 4 mg via INTRAVENOUS

## 2020-04-02 MED ORDER — PANTOPRAZOLE SODIUM 40 MG PO TBEC
40.0000 mg | DELAYED_RELEASE_TABLET | Freq: Every day | ORAL | Status: DC
  Administered 2020-04-02: 40 mg via ORAL
  Filled 2020-04-02: qty 1

## 2020-04-02 MED ORDER — LIDOCAINE 2% (20 MG/ML) 5 ML SYRINGE
INTRAMUSCULAR | Status: AC
  Filled 2020-04-02: qty 5

## 2020-04-02 MED ORDER — CEFAZOLIN SODIUM-DEXTROSE 2-4 GM/100ML-% IV SOLN
2.0000 g | INTRAVENOUS | Status: AC
  Administered 2020-04-02: 2 g via INTRAVENOUS
  Filled 2020-04-02: qty 100

## 2020-04-02 MED ORDER — FENTANYL CITRATE (PF) 250 MCG/5ML IJ SOLN
INTRAMUSCULAR | Status: AC
  Filled 2020-04-02: qty 5

## 2020-04-02 MED ORDER — CHLORHEXIDINE GLUCONATE 0.12 % MT SOLN
OROMUCOSAL | Status: AC
  Administered 2020-04-02: 15 mL via OROMUCOSAL
  Filled 2020-04-02: qty 15

## 2020-04-02 MED ORDER — METHOCARBAMOL 1000 MG/10ML IJ SOLN
500.0000 mg | Freq: Four times a day (QID) | INTRAVENOUS | Status: DC | PRN
  Filled 2020-04-02: qty 5

## 2020-04-02 MED ORDER — BUPIVACAINE HCL 0.5 % IJ SOLN
INTRAMUSCULAR | Status: DC | PRN
  Administered 2020-04-02: 30 mL

## 2020-04-02 MED ORDER — MEPERIDINE HCL 25 MG/ML IJ SOLN: 6.2500 mg | INTRAMUSCULAR | Status: DC | PRN

## 2020-04-02 MED ORDER — THROMBIN (RECOMBINANT) 20000 UNITS EX SOLR
CUTANEOUS | Status: AC
  Filled 2020-04-02: qty 20000

## 2020-04-02 MED ORDER — LIDOCAINE 2% (20 MG/ML) 5 ML SYRINGE
INTRAMUSCULAR | Status: DC | PRN
  Administered 2020-04-02: 60 mg via INTRAVENOUS

## 2020-04-02 MED ORDER — SODIUM CHLORIDE 0.9% FLUSH: 3.0000 mL | INTRAVENOUS | Status: DC | PRN

## 2020-04-02 MED ORDER — FLEET ENEMA 7-19 GM/118ML RE ENEM: 1.0000 | ENEMA | Freq: Once | RECTAL | Status: DC | PRN

## 2020-04-02 MED ORDER — ONDANSETRON HCL 4 MG/2ML IJ SOLN: 4.0000 mg | Freq: Four times a day (QID) | INTRAMUSCULAR | Status: DC | PRN

## 2020-04-02 MED ORDER — OXYCODONE HCL 5 MG/5ML PO SOLN: 5.0000 mg | Freq: Once | ORAL | Status: DC | PRN

## 2020-04-02 MED ORDER — PROPOFOL 10 MG/ML IV BOLUS
INTRAVENOUS | Status: DC | PRN
Start: 2020-04-02 — End: 2020-04-02
  Administered 2020-04-02: 150 mg via INTRAVENOUS

## 2020-04-02 MED ORDER — FENTANYL CITRATE (PF) 100 MCG/2ML IJ SOLN
INTRAMUSCULAR | Status: DC | PRN
  Administered 2020-04-02 (×2): 50 ug via INTRAVENOUS

## 2020-04-02 MED ORDER — PHENYLEPHRINE HCL-NACL 10-0.9 MG/250ML-% IV SOLN
INTRAVENOUS | Status: DC | PRN
  Administered 2020-04-02: 50 ug/min via INTRAVENOUS

## 2020-04-02 MED ORDER — OXYCODONE HCL 5 MG PO TABS: 5.0000 mg | ORAL_TABLET | Freq: Once | ORAL | Status: DC | PRN

## 2020-04-02 MED ORDER — THROMBIN 20000 UNITS EX SOLR
CUTANEOUS | Status: DC | PRN
  Administered 2020-04-02: 20000 [IU] via TOPICAL

## 2020-04-02 MED ORDER — DEXAMETHASONE SODIUM PHOSPHATE 10 MG/ML IJ SOLN
INTRAMUSCULAR | Status: AC
  Filled 2020-04-02: qty 1

## 2020-04-02 MED ORDER — ORAL CARE MOUTH RINSE: 15.0000 mL | Freq: Once | OROMUCOSAL | Status: AC

## 2020-04-02 MED ORDER — SCOPOLAMINE 1 MG/3DAYS TD PT72: 1.0000 | MEDICATED_PATCH | TRANSDERMAL | Status: DC

## 2020-04-02 MED ORDER — DOCUSATE SODIUM 100 MG PO CAPS
100.0000 mg | ORAL_CAPSULE | Freq: Two times a day (BID) | ORAL | Status: DC
  Administered 2020-04-02 – 2020-04-03 (×2): 100 mg via ORAL
  Filled 2020-04-02 (×2): qty 1

## 2020-04-02 MED ORDER — PROMETHAZINE HCL 25 MG/ML IJ SOLN
6.2500 mg | INTRAMUSCULAR | Status: DC | PRN
  Administered 2020-04-02: 12.5 mg via INTRAVENOUS

## 2020-04-02 MED ORDER — GABAPENTIN 300 MG PO CAPS
300.0000 mg | ORAL_CAPSULE | Freq: Three times a day (TID) | ORAL | Status: DC
  Administered 2020-04-02 – 2020-04-03 (×3): 300 mg via ORAL
  Filled 2020-04-02 (×3): qty 1

## 2020-04-02 MED ORDER — KETOROLAC TROMETHAMINE 15 MG/ML IJ SOLN
7.5000 mg | Freq: Four times a day (QID) | INTRAMUSCULAR | Status: AC
  Administered 2020-04-02 – 2020-04-03 (×4): 7.5 mg via INTRAVENOUS
  Filled 2020-04-02 (×4): qty 1

## 2020-04-02 MED ORDER — BISACODYL 5 MG PO TBEC: 5.0000 mg | DELAYED_RELEASE_TABLET | Freq: Every day | ORAL | Status: DC | PRN

## 2020-04-02 MED ORDER — HYDROMORPHONE HCL 1 MG/ML IJ SOLN
INTRAMUSCULAR | Status: AC
  Filled 2020-04-02: qty 1

## 2020-04-02 MED ORDER — BUPIVACAINE HCL (PF) 0.5 % IJ SOLN
INTRAMUSCULAR | Status: AC
  Filled 2020-04-02: qty 30

## 2020-04-02 MED ORDER — ONDANSETRON HCL 4 MG/2ML IJ SOLN
INTRAMUSCULAR | Status: AC
  Filled 2020-04-02: qty 2

## 2020-04-02 MED ORDER — POLYETHYLENE GLYCOL 3350 17 G PO PACK: 17.0000 g | PACK | Freq: Every day | ORAL | Status: DC | PRN

## 2020-04-02 MED ORDER — PROPOFOL 10 MG/ML IV BOLUS
INTRAVENOUS | Status: AC
  Filled 2020-04-02: qty 40

## 2020-04-02 MED ORDER — GLYCOPYRROLATE 0.2 MG/ML IJ SOLN
INTRAMUSCULAR | Status: DC | PRN
  Administered 2020-04-02: 2 mg via INTRAVENOUS

## 2020-04-02 MED ORDER — LACTATED RINGERS IV SOLN: INTRAVENOUS | Status: DC

## 2020-04-02 MED ORDER — MENTHOL 3 MG MT LOZG
1.0000 | LOZENGE | OROMUCOSAL | Status: DC | PRN
Start: 2020-04-02 — End: 2020-04-03
  Filled 2020-04-02: qty 9

## 2020-04-02 MED ORDER — HYDROCODONE-ACETAMINOPHEN 7.5-325 MG PO TABS: 2.0000 | ORAL_TABLET | ORAL | Status: DC | PRN

## 2020-04-02 MED ORDER — PANTOPRAZOLE SODIUM 40 MG IV SOLR: 40.0000 mg | Freq: Every day | INTRAVENOUS | Status: DC

## 2020-04-02 MED ORDER — DEXAMETHASONE SODIUM PHOSPHATE 4 MG/ML IJ SOLN
INTRAMUSCULAR | Status: DC | PRN
  Administered 2020-04-02: 10 mg via INTRAVENOUS

## 2020-04-02 MED ORDER — PROMETHAZINE HCL 25 MG/ML IJ SOLN
INTRAMUSCULAR | Status: AC
  Filled 2020-04-02: qty 1

## 2020-04-02 MED ORDER — ALBUTEROL SULFATE HFA 108 (90 BASE) MCG/ACT IN AERS
2.0000 | INHALATION_SPRAY | Freq: Four times a day (QID) | RESPIRATORY_TRACT | Status: DC | PRN
  Filled 2020-04-02: qty 6.7

## 2020-04-02 MED ORDER — MIDAZOLAM HCL 2 MG/2ML IJ SOLN
0.5000 mg | Freq: Once | INTRAMUSCULAR | Status: DC | PRN
Start: 2020-04-02 — End: 2020-04-02

## 2020-04-02 MED ORDER — CHLORHEXIDINE GLUCONATE 0.12 % MT SOLN: 15.0000 mL | Freq: Once | OROMUCOSAL | Status: AC

## 2020-04-02 MED ORDER — METHOCARBAMOL 500 MG PO TABS
500.0000 mg | ORAL_TABLET | Freq: Four times a day (QID) | ORAL | Status: DC | PRN
  Administered 2020-04-02: 500 mg via ORAL
  Filled 2020-04-02: qty 1

## 2020-04-02 MED ORDER — MIDAZOLAM HCL 5 MG/5ML IJ SOLN
INTRAMUSCULAR | Status: DC | PRN
  Administered 2020-04-02: 2 mg via INTRAVENOUS

## 2020-04-02 MED ORDER — ROCURONIUM BROMIDE 10 MG/ML (PF) SYRINGE
PREFILLED_SYRINGE | INTRAVENOUS | Status: DC | PRN
  Administered 2020-04-02: 40 mg via INTRAVENOUS
  Administered 2020-04-02: 60 mg via INTRAVENOUS

## 2020-04-02 MED ORDER — HYDROMORPHONE HCL 1 MG/ML IJ SOLN
0.2500 mg | INTRAMUSCULAR | Status: DC | PRN
  Administered 2020-04-02 (×2): 0.5 mg via INTRAVENOUS

## 2020-04-02 MED ORDER — MIDAZOLAM HCL 2 MG/2ML IJ SOLN
INTRAMUSCULAR | Status: AC
  Filled 2020-04-02: qty 2

## 2020-04-02 MED ORDER — ALUM & MAG HYDROXIDE-SIMETH 200-200-20 MG/5ML PO SUSP: 30.0000 mL | Freq: Four times a day (QID) | ORAL | Status: DC | PRN

## 2020-04-02 MED ORDER — PHENOL 1.4 % MT LIQD: 1.0000 | OROMUCOSAL | Status: DC | PRN

## 2020-04-02 MED ORDER — CEFAZOLIN SODIUM-DEXTROSE 2-4 GM/100ML-% IV SOLN
2.0000 g | Freq: Three times a day (TID) | INTRAVENOUS | Status: AC
  Administered 2020-04-02 – 2020-04-03 (×2): 2 g via INTRAVENOUS
  Filled 2020-04-02 (×2): qty 100

## 2020-04-02 MED ORDER — GLYCOPYRROLATE PF 0.2 MG/ML IJ SOSY
PREFILLED_SYRINGE | INTRAMUSCULAR | Status: AC
  Filled 2020-04-02: qty 1

## 2020-04-02 MED ORDER — 0.9 % SODIUM CHLORIDE (POUR BTL) OPTIME
TOPICAL | Status: DC | PRN
  Administered 2020-04-02: 1000 mL

## 2020-04-02 MED ORDER — ROCURONIUM BROMIDE 10 MG/ML (PF) SYRINGE
PREFILLED_SYRINGE | INTRAVENOUS | Status: AC
  Filled 2020-04-02: qty 10

## 2020-04-02 SURGICAL SUPPLY — 44 items
ADH SKN CLS APL DERMABOND .7 (GAUZE/BANDAGES/DRESSINGS) ×1
BIT DRILL NEURO 2X3.1 SFT TUCH (MISCELLANEOUS) IMPLANT
BLADE CLIPPER SURG (BLADE) ×1 IMPLANT
COLLAR CERV LO CONTOUR FIRM DE (SOFTGOODS) ×2 IMPLANT
COVER MAYO STAND STRL (DRAPES) ×3 IMPLANT
COVER SURGICAL LIGHT HANDLE (MISCELLANEOUS) ×2 IMPLANT
COVER WAND RF STERILE (DRAPES) ×2 IMPLANT
DERMABOND ADVANCED (GAUZE/BANDAGES/DRESSINGS) ×1
DERMABOND ADVANCED .7 DNX12 (GAUZE/BANDAGES/DRESSINGS) ×1 IMPLANT
DRAPE C-ARM 42X72 X-RAY (DRAPES) ×2 IMPLANT
DRAPE HALF SHEET 40X57 (DRAPES) ×4 IMPLANT
DRAPE MICROSCOPE LEICA (MISCELLANEOUS) ×2 IMPLANT
DRAPE SURG 17X23 STRL (DRAPES) ×8 IMPLANT
DRILL NEURO 2X3.1 SOFT TOUCH (MISCELLANEOUS) ×2
DRSG PAD ABDOMINAL 8X10 ST (GAUZE/BANDAGES/DRESSINGS) ×4 IMPLANT
DURAPREP 6ML APPLICATOR 50/CS (WOUND CARE) ×2 IMPLANT
ELECT CAUTERY BLADE 6.4 (BLADE) ×2 IMPLANT
ELECT REM PT RETURN 9FT ADLT (ELECTROSURGICAL) ×2
ELECTRODE REM PT RTRN 9FT ADLT (ELECTROSURGICAL) ×1 IMPLANT
GAUZE SPONGE 4X4 12PLY STRL (GAUZE/BANDAGES/DRESSINGS) ×2 IMPLANT
GLOVE BIOGEL PI IND STRL 8 (GLOVE) ×1 IMPLANT
GLOVE BIOGEL PI INDICATOR 8 (GLOVE) ×1
GLOVE ECLIPSE 9.0 STRL (GLOVE) ×2 IMPLANT
GLOVE ORTHO TXT STRL SZ7.5 (GLOVE) ×2 IMPLANT
GLOVE SURG 8.5 LATEX PF (GLOVE) ×2 IMPLANT
GOWN STRL REUS W/ TWL LRG LVL3 (GOWN DISPOSABLE) ×1 IMPLANT
GOWN STRL REUS W/TWL 2XL LVL3 (GOWN DISPOSABLE) ×4 IMPLANT
GOWN STRL REUS W/TWL LRG LVL3 (GOWN DISPOSABLE) ×2
KIT BASIN OR (CUSTOM PROCEDURE TRAY) ×2 IMPLANT
KIT TURNOVER KIT B (KITS) ×2 IMPLANT
NDL SPNL 18GX3.5 QUINCKE PK (NEEDLE) ×1 IMPLANT
NEEDLE SPNL 18GX3.5 QUINCKE PK (NEEDLE) ×2 IMPLANT
NS IRRIG 1000ML POUR BTL (IV SOLUTION) ×2 IMPLANT
PACK ORTHO CERVICAL (CUSTOM PROCEDURE TRAY) ×2 IMPLANT
PAD ARMBOARD 7.5X6 YLW CONV (MISCELLANEOUS) ×4 IMPLANT
PATTIES SURGICAL .25X.25 (GAUZE/BANDAGES/DRESSINGS) ×2 IMPLANT
SPONGE SURGIFOAM ABS GEL 100 (HEMOSTASIS) ×2 IMPLANT
SUT VIC AB 2-0 UR6 27 (SUTURE) ×1 IMPLANT
SUT VICRYL 0 UR6 27IN ABS (SUTURE) ×3 IMPLANT
TAPE CLOTH 4X10 WHT NS (GAUZE/BANDAGES/DRESSINGS) ×2 IMPLANT
TAPE CLOTH SURG 4X10 WHT LF (GAUZE/BANDAGES/DRESSINGS) ×1 IMPLANT
TOWEL GREEN STERILE (TOWEL DISPOSABLE) ×2 IMPLANT
TOWEL GREEN STERILE FF (TOWEL DISPOSABLE) ×2 IMPLANT
WATER STERILE IRR 1000ML POUR (IV SOLUTION) ×2 IMPLANT

## 2020-04-02 NOTE — Brief Op Note (Signed)
04/02/2020  11:01 AM  PATIENT:  [name]  64 y.o. female  PRE-OPERATIVE DIAGNOSIS:  cervical spondylosis right C5-6  POST-OPERATIVE DIAGNOSIS:  cervical spondylosis right C5-6  PROCEDURE:  Procedure(s): RIGHT C5-6 FORAMINOTOMY (N/A)  SURGEON:  Surgeon(s) and Role:    * Kerrin Champagne, MD - Primary  PHYSICIAN ASSISTANT: Zonia Kief, PA-C  ANESTHESIA:   local and general, Dr. Jairo Ben  EBL:  50 mL   BLOOD ADMINISTERED:none  DRAINS: none   LOCAL MEDICATIONS USED:  MARCAINE 0.5%  Amount: 10 ml  SPECIMEN:  No Specimen  DISPOSITION OF SPECIMEN:  N/A  COUNTS:  YES  TOURNIQUET:  * No tourniquets in log *  DICTATION: .Dragon Dictation  PLAN OF CARE: Admit for overnight observation  PATIENT DISPOSITION:  PACU - hemodynamically stable.   Delay start of Pharmacological VTE agent (>24hrs) due to surgical blood loss or risk of bleeding: yes

## 2020-04-02 NOTE — Anesthesia Procedure Notes (Signed)
Procedure Name: Intubation Date/Time: 04/02/2020 8:49 AM Performed by: Lieutenant Diego, CRNA Pre-anesthesia Checklist: Patient identified, Emergency Drugs available, Suction available and Patient being monitored Patient Re-evaluated:Patient Re-evaluated prior to induction Oxygen Delivery Method: Circle system utilized Preoxygenation: Pre-oxygenation with 100% oxygen Induction Type: IV induction Ventilation: Mask ventilation without difficulty Laryngoscope Size: Miller and 2 Grade View: Grade I Tube type: Oral Tube size: 7.0 mm Number of attempts: 1 Airway Equipment and Method: Stylet Placement Confirmation: ETT inserted through vocal cords under direct vision,  positive ETCO2 and breath sounds checked- equal and bilateral Secured at: 22 cm Tube secured with: Tape Dental Injury: Teeth and Oropharynx as per pre-operative assessment

## 2020-04-02 NOTE — Anesthesia Postprocedure Evaluation (Signed)
Anesthesia Post Note  Patient: RUTA CAPECE  Procedure(s) Performed: RIGHT C5-6 FORAMINOTOMY (N/A )     Patient location during evaluation: PACU Anesthesia Type: General Level of consciousness: awake and alert, patient cooperative and oriented Pain management: pain level controlled Vital Signs Assessment: post-procedure vital signs reviewed and stable Respiratory status: spontaneous breathing, nonlabored ventilation, respiratory function stable and patient connected to nasal cannula oxygen Cardiovascular status: blood pressure returned to baseline and stable Postop Assessment: no apparent nausea or vomiting Anesthetic complications: no   No complications documented.  Last Vitals:  Vitals:   04/02/20 1145 04/02/20 1158  BP: (!) 164/97 (!) 158/91  Pulse: 87 90  Resp: (!) 8 10  Temp:  36.7 C  SpO2: 96% 96%    Last Pain:  Vitals:   04/02/20 1158  TempSrc:   PainSc: 1     LLE Motor Response: Purposeful movement (04/02/20 1158) LLE Sensation: Full sensation (04/02/20 1158) RLE Motor Response: Purposeful movement (04/02/20 1158) RLE Sensation: Full sensation (04/02/20 1158)      Yarithza Mink,E. Nimsi Males

## 2020-04-02 NOTE — Evaluation (Signed)
Physical Therapy Evaluation Patient Details Name: [name] MRN: [id number] DOB: November 12, 1955 Today's Date: 04/02/2020   History of Present Illness  The pt is a 64 yo female presenting s/p foraminotomy of C5-6 on 12/27 due to neck and RUE radiculopathy. PMH includes: arthritis of knees, hips, and elbows, COPD, anxiety, and 2 prior back surgeries, bilateral TKA, and L THA.  Clinical Impression  Pt in bed upon arrival of PT, agreeable to evaluation at this time. Prior to admission the pt was independent with mobility and ADLs, but does report some intermittent use of cane due to poor balance. The pt now presents with minor limitations in functional mobility, endurance, and dynamic stability due to above dx, but will be safe to return home with family assist. The pt demos good safety understanding with mobility and stairs, but will benefit from repeated cues for log roll technique and application of cervical precautions to mobility. The pt was able to complete bed mobility, gait, and stairs with supervision to minG for safety, but no use of AD at this time, and was agreeable to HEP of walking with family at home to continue to progress mobility.     Follow Up Recommendations No PT follow up;Supervision for mobility/OOB    Equipment Recommendations  None recommended by PT (pt well equipped)    Recommendations for Other Services       Precautions / Restrictions Precautions Precautions: Cervical Precaution Booklet Issued: Yes (comment) Precaution Comments: cervical precautions discussed verbally, and application to mobility such as stairs, bed mobility, and falls Required Braces or Orthoses: Cervical Brace Cervical Brace: Soft collar;At all times Restrictions Weight Bearing Restrictions: No      Mobility  Bed Mobility Overal bed mobility: Needs Assistance Bed Mobility: Rolling;Sidelying to Sit;Sit to Sidelying Rolling: Min guard Sidelying to sit: Min guard     Sit to sidelying:  Min guard General bed mobility comments: minG with repeated VC for log roll technique to complete without breaking precautions    Transfers Overall transfer level: Needs assistance Equipment used: None;1 person hand held assist Transfers: Sit to/from Stand Sit to Stand: Min guard         General transfer comment: minG for safety, no AD needed at eval, pt offered HHA but did not used  Ambulation/Gait Ambulation/Gait assistance: Min guard Gait Distance (Feet): 200 Feet Assistive device: None Gait Pattern/deviations: Step-through pattern;Decreased stride length Gait velocity: 0.5 m/s Gait velocity interpretation: <1.31 ft/sec, indicative of household ambulator General Gait Details: slowed gait with decreased stride length. pt able to maintain stability without UE support adn no LOB  Stairs Stairs: Yes Stairs assistance: Min guard Stair Management: One rail Right;Step to pattern Number of Stairs: 5 General stair comments: minG with use of single rail, minA without UE support on rail. discused assist from family to enter home    Balance Overall balance assessment: Mild deficits observed, not formally tested                                           Pertinent Vitals/Pain Pain Assessment: Faces Faces Pain Scale: No hurt Pain Intervention(s): Monitored during session    Home Living Family/patient expects to be discharged to:: Private residence Living Arrangements: Children;Spouse/significant other Available Help at Discharge: Family;Available 24 hours/day Type of Home: House Home Access: Stairs to enter;Ramped entrance Entrance Stairs-Rails: None Entrance Stairs-Number of Steps: 2 Home Layout: One level  Home Equipment: White - 2 wheels;Cane - single point;Bedside commode;Shower seat;Grab bars - tub/shower;Hand held shower head      Prior Function Level of Independence: Independent         Comments: ADLs, IADLs, driving; uses SPC "as needed"      Hand Dominance   Dominant Hand: Right    Extremity/Trunk Assessment   Upper Extremity Assessment Upper Extremity Assessment: Defer to OT evaluation LUE Deficits / Details: Splint from OP; finger injury post fall. LUE Coordination: decreased fine motor    Lower Extremity Assessment Lower Extremity Assessment: Overall WFL for tasks assessed (pt denies difference in sensation, able to use both functionally for stairs)    Cervical / Trunk Assessment Cervical / Trunk Assessment: Other exceptions Cervical / Trunk Exceptions: s/p cervical sx  Communication   Communication: No difficulties  Cognition Arousal/Alertness: Awake/alert Behavior During Therapy: WFL for tasks assessed/performed Overall Cognitive Status: Within Functional Limits for tasks assessed                                 General Comments: pt with slightly flat affect, but pleasant and agreeable to all education. increased cues for safety at times but generally [facility]      General Comments General comments (skin integrity, edema, etc.): VSS on RA, discussed precautions with mobility    Exercises     Assessment/Plan    PT Assessment Patient needs continued PT services  PT Problem List Decreased activity tolerance;Decreased balance;Decreased mobility;Decreased coordination       PT Treatment Interventions DME instruction;Gait training;Stair training;Functional mobility training;Therapeutic activities;Therapeutic exercise;Balance training    PT Goals (Current goals can be found in the Care Plan section)  Acute Rehab PT Goals Patient Stated Goal: Go home PT Goal Formulation: With patient Time For Goal Achievement: 04/16/20 Potential to Achieve Goals: Good    Frequency Min 5X/week    AM-PAC PT "6 Clicks" Mobility  Outcome Measure Help needed turning from your back to your side while in a flat bed without using bedrails?: A Little Help needed moving from lying on your back to sitting on the  side of a flat bed without using bedrails?: A Little Help needed moving to and from a bed to a chair (including a wheelchair)?: A Little Help needed standing up from a chair using your arms (e.g., wheelchair or bedside chair)?: None Help needed to walk in hospital room?: A Little Help needed climbing 3-5 steps with a railing? : A Little 6 Click Score: 19    End of Session Equipment Utilized During Treatment: Gait belt;Cervical collar Activity Tolerance: Patient tolerated treatment well Patient left: in bed;with call bell/phone within reach Nurse Communication: Mobility status PT Visit Diagnosis: Other abnormalities of gait and mobility (R26.89);Pain;Unsteadiness on feet (R26.81) Pain - part of body:  (neck)    Time: [id number] PT Time Calculation (min) (ACUTE ONLY): 26 min   Charges:   PT Evaluation $PT Eval Low Complexity: 1 Low PT Treatments $Gait Training: 8-22 mins        Karma Ganja, PT, DPT   Acute Rehabilitation Department Pager #: [phone]  Otho Bellows 04/02/2020, 5:10 PM

## 2020-04-02 NOTE — Evaluation (Signed)
Occupational Therapy Evaluation Patient Details Name: Katrina [name] MRN: [id number] DOB: 17-Mar-1956 Today's Date: 04/02/2020    History of Present Illness The pt is a 64 yo female presenting s/p foraminotomy of C5-6 on 12/27 due to neck and RUE radiculopathy. PMH includes: arthritis of knees, hips, and elbows, COPD, anxiety, and 2 prior back surgeries, bilateral TKA, and L THA.   Clinical Impression   PTA, pt was living with her husband and two daughters and was independent. Currently, pt requires Supervision-Min Guard A for ADLs and functional mobility. Provided education on cervical precautions, bed mobility, grooming, UB ADLs, LB ADLs, toileting, and shower transfer; pt demonstrated understanding. Answered all pt questions. Recommend dc home once medically stable per physician. All acute OT needs met and will sign off. Thank you.    Follow Up Recommendations  No OT follow up;Supervision/Assistance - 24 hour    Equipment Recommendations  None recommended by OT    Recommendations for Other Services PT consult     Precautions / Restrictions Precautions Precautions: Cervical Precaution Booklet Issued: Yes (comment) Precaution Comments: Cervical precautions and reviewed compensatory techniques for ADLs Required Braces or Orthoses: Cervical Brace Cervical Brace: Soft collar;At all times      Mobility Bed Mobility Overal bed mobility: Needs Assistance                  Transfers Overall transfer level: Needs assistance   Transfers: Sit to/from Stand Sit to Stand: Supervision;Min guard         General transfer comment: Supervision-MI nGUard A for safety    Balance Overall balance assessment: Mild deficits observed, not formally tested                                         ADL either performed or assessed with clinical judgement   ADL Overall ADL's : Needs assistance/impaired Eating/Feeding: Set up;Sitting   Grooming: Wash/dry  hands;Supervision/safety;Set up;Standing   Upper Body Bathing: Supervision/ safety;Set up;Sitting   Lower Body Bathing: Min guard;Sit to/from stand   Upper Body Dressing : Set up;Supervision/safety;Sitting Upper Body Dressing Details (indicate cue type and reason): Providing education on how to don shirt using compensatory technique Lower Body Dressing: Min guard;Sit to/from stand Lower Body Dressing Details (indicate cue type and reason): donning underwear using figure four method Toilet Transfer: Supervision/safety;Ambulation;Regular Toilet;Grab bars   Toileting- Clothing Manipulation and Hygiene: Supervision/safety;Sitting/lateral lean       Functional mobility during ADLs: Min guard       Vision         Perception     Praxis      Pertinent Vitals/Pain Pain Assessment: Faces Faces Pain Scale: No hurt Pain Intervention(s): Monitored during session;Premedicated before session     Hand Dominance Right   Extremity/Trunk Assessment Upper Extremity Assessment Upper Extremity Assessment: LUE deficits/detail LUE Deficits / Details: Splint from OP; finger injury post fall. LUE Coordination: decreased fine motor   Lower Extremity Assessment Lower Extremity Assessment: Defer to PT evaluation   Cervical / Trunk Assessment Cervical / Trunk Assessment: Other exceptions Cervical / Trunk Exceptions: s/p cervical sx   Communication Communication Communication: No difficulties   Cognition Arousal/Alertness: Awake/alert Behavior During Therapy: WFL for tasks assessed/performed Overall Cognitive Status: Within Functional Limits for tasks assessed  General Comments:  (At first, drowsy and lethargic. Once more awake, pt appropiate and [facility])   General Comments       Exercises     Shoulder Instructions      Home Living Family/patient expects to be discharged to:: Private residence Living Arrangements:  Children;Spouse/significant other Available Help at Discharge: Family;Available 24 hours/day Type of Home: House Home Access: Stairs to enter;Ramped entrance Entrance Stairs-Number of Steps: 2 Entrance Stairs-Rails: None Home Layout: One level     Bathroom Shower/Tub: Occupational psychologist: Handicapped height     Home Equipment: Environmental consultant - 2 wheels;Cane - single point;Bedside commode;Shower seat;Grab bars - tub/shower;Hand held shower head          Prior Functioning/Environment Level of Independence: Independent        Comments: ADLs, IADLs, driving; uses SPC "as needed"        OT Problem List: Decreased activity tolerance;Impaired balance (sitting and/or standing);Decreased knowledge of precautions;Pain      OT Treatment/Interventions:      OT Goals(Current goals can be found in the care plan section) Acute Rehab OT Goals Patient Stated Goal: Go home OT Goal Formulation: All assessment and education complete, DC therapy  OT Frequency:     Barriers to D/C:            Co-evaluation              AM-PAC OT "6 Clicks" Daily Activity     Outcome Measure Help from another person eating meals?: None Help from another person taking care of personal grooming?: A Little Help from another person toileting, which includes using toliet, bedpan, or urinal?: A Little Help from another person bathing (including washing, rinsing, drying)?: A Little Help from another person to put on and taking off regular upper body clothing?: A Little Help from another person to put on and taking off regular lower body clothing?: A Little 6 Click Score: 19   End of Session Equipment Utilized During Treatment: Cervical collar Nurse Communication: Mobility status  Activity Tolerance: Patient tolerated treatment well Patient left: in bed (with PT)  OT Visit Diagnosis: Unsteadiness on feet (R26.81);Other abnormalities of gait and mobility (R26.89);Muscle weakness (generalized)  (M62.81)                Time: 9381-8299 OT Time Calculation (min): 21 min Charges:  OT General Charges $OT Visit: 1 Visit OT Evaluation $OT Eval Moderate Complexity: Crump, OTR/L Acute Rehab Pager: [phone] Office: [place] 04/02/2020, 3:19 PM

## 2020-04-02 NOTE — Transfer of Care (Signed)
Immediate Anesthesia Transfer of Care Note  Patient: NATALINE BASARA  Procedure(s) Performed: RIGHT C5-6 FORAMINOTOMY (N/A )  Patient Location: PACU  Anesthesia Type:General  Level of Consciousness: awake  Airway & Oxygen Therapy: Patient Spontanous Breathing and Patient connected to face mask oxygen  Post-op Assessment: Report given to RN and Post -op Vital signs reviewed and stable  Post vital signs: Reviewed and stable  Last Vitals:  Vitals Value Taken Time  BP    Temp    Pulse 109 04/02/20 1059  Resp 19 04/02/20 1059  SpO2 97 % 04/02/20 1059  Vitals shown include unvalidated device data.  Last Pain:  Vitals:   04/02/20 0650  TempSrc:   PainSc: 5       Patients Stated Pain Goal: 2 (04/02/20 0650)  Complications: No complications documented.

## 2020-04-02 NOTE — Interval H&P Note (Signed)
History and Physical Interval Note:  04/02/2020 7:31 AM  Katrina [name]  has presented today for surgery, with the diagnosis of cervical spondylosis right C5-6.  The various methods of treatment have been discussed with the patient and family. After consideration of risks, benefits and other options for treatment, the patient has consented to  Procedure(s): RIGHT C5-6 FORAMINOTOMY (N/A) as a surgical intervention.  The patient's history has been reviewed, patient examined, no change in status, stable for surgery.  I have reviewed the patient's chart and labs.  Questions were answered to the patient's satisfaction.     Basil Dess

## 2020-04-02 NOTE — Op Note (Signed)
04/02/2020  11:02 AM  PATIENT:  Katrina [name]  64 y.o. female  MRN: [id number]  OPERATIVE REPORT  PRE-OPERATIVE DIAGNOSIS:  cervical spondylosis right C5-6  POST-OPERATIVE DIAGNOSIS:  cervical spondylosis right C5-6  PROCEDURE:  Procedure(s): RIGHT C5-6 FORAMINOTOMY    SURGEON:  Jessy Oto, MD     ASSISTANT:  Benjiman Core, PA-C  (Present throughout the entire procedure and necessary for completion of procedure in a timely manner)     ANESTHESIA:  General,    COMPLICATIONS:  None.   PROCEDURE:The patient was met in the holding area, and the appropriate right C5-6 cervical level identified and marked with "x" and my initials. All questions were answered and informed consent signed.   The patient was then transported to OR. The patient was then placed under general anesthesia without difficulty. A foley catheter was placed sterilely by OR nursing personnel. and transferred to the operating room table prone position [name] with [name]. All pressure points well-padded PAS stockings.Shoulders taped down and skin over the posterior inferior aspect of the neck place in traction to decrease skin folds. The patient received appropriate preoperative antibiotic 3 grams ancef. prophylaxis.Time-out procedure was called and correct.   Sterile prep with DuraPrep and draped in the usual manner the shoulders were taped downwards and skin traction over the skin of the neck. Following DuraPrep draped in the usual manner. After timeout protocol incision was made approximately C4-7 in the midline. This following infiltration of skin and subcutaneous layers with marcaine 0.5% total of 10 cc. Incision carried through skin and subcutaneous layers using 10 blade scalpel and electrocautery down to the level ligamentum nuchae. Incision made along the lateral aspect of the spinous process of C5. Clamp then placed at the spinous process of C4. Intraoperative C-arm fluoroscopy identified the  clamp at the C4 spinous process level. Then a marking pen was used to mark the spinous process of C4. Electrocautery then used to carefully incise the cervical muscles off the left lateral aspect of the spinous process of C5 and C6. Dividing the  spinous muscles off of the inferior aspect lamina at C5 exposing the C5-6 posterior aspect of the interlaminar space. The magnification headlamp were used during this portion procedure. Boss McCollough retractor was inserted. High-speed bur was used to remove a small portion of bone from the inferior aspect of lamina of C5 and the medial 20% of the inferior articular process of C6. Further thinning the superior aspect of the lamina right C6. A 1 mm Kerrison was then used to remove him from superior aspect of the lamina C6 and the medial aspect of the intra-articular process of C5 the 20%, exposing the superior articular process of C6. A 1 mm Kerrison was removed bone off the medial aspect of the superior articular process of C6 resecting 20% of the medial aspect of the superior articular process of C6. Ligamentum flavum then easily lifted superiorly  electrocautery unit cauterizing epidural veins deep to the ligamentum flavum  then resecting the ligamentum flavum. The operating room microscope was draped sterilely and brought into the field. Under the operating room microscope the epidural vein layer overlying the posterior aspect of the thecal sac and the C6 nerve root was then carefully lifted using a micro-titanium nerve hook then cauterized using bipolar electrocautery a # 15 blade scalpel then used to incise this overlying the C6 nerve root releasing the vascular leash a backward angle 3-0 microcurette then used to remove a small portion of bone  off the superior and medial aspect of the pedicle further mobilizing the C6 nerve root bipolar electrocautery to control all bleeding within the axillary area of the C6 nerve. Bone wax was applied to bleeding cancellus bone  surfaces are excellent hemostasis obtained  The disc explored using a Penfield 4 found to primarily spondylosis spur and uncovertebral joint found to be prominent resulting in right C6 foramenal narrowing. Following this then hemostasis was obtained using thrombin-soaked Gelfoam and micro-pledgettes. When complete hemostasis was obtained all gel foam was removed the nerve hook could be easily passed out the neuroforamen without the lateral aspect of the C6 pedicle demonstrate the C6 neuroforamen completely decompressed. Irrigation was carried out no active bleeding was present. The incision was closed by approximating the ligamentum nuchae with #1 ethibond sutures. The subcutaneous layers approximated with interrupted 0 Vicryl suture more superficial layers with interrupted 2-0 Vicryl sutures and the skin closed with interrupted 4-0 Vicryl sutures. Stainless Steel staple were applied then MedPlex bandage. Soft cervical collar placed.  All instrument and sponge counts were correct. Patient was then returned to supine position on her stretcher. Returned to recovery room in satisfactory condition.   Physician assistant's responsibilities: Benjiman Core, PA-C perform the duties of assistant physician and surgeon during this case present from the beginning of the case to the end of the case. He assisted with careful retraction of neural structures suctioning about her elements including cervical cord and C6 nerve root. Performed closure of the incision on the ligamentum nuchae to the skin and application of dressing. He assisted in positioning the patient had removal the patient from the OR table to the stretcher.   Basil Dess 04/02/2020, 11:02 AM

## 2020-04-02 NOTE — H&P (Signed)
Katrina [name] is an 64 y.o. female.   Chief Complaint: neck pain and right UE radiculopathy HPI: 64 year old white female history of C5-6 HNP neck pain and upper extremity radiculopathy on the right comes in for preop evaluation. Symptoms unchanged from previous visit and she is wanting to proceed with right C5-6 foraminotomy as scheduled. Today history and physical performed. Review of systems patient admits to having dyspnea with "chimney smoke". She does have a history of COPD.  Past Medical History:  Diagnosis Date  . Anxiety   . Arthritis    knees, hips, elbows   . Asthma    hx of asthma 3/12- hospitalized   . COPD (chronic obstructive pulmonary disease) ([place])   . Depression   . GERD (gastroesophageal reflux disease)    PRIOR TO HAVING GALLBLADDER REMOVED  . Headache   . Left bundle branch block 06/28/2010  . Lung infection 2015  . Lung infection   . volvulus     previous history of twisted intestines    Past Surgical History:  Procedure Laterality Date  . BACK SURGERY     x2  . CARPAL TUNNEL RELEASE     left   . CESAREAN SECTION     x 2  . CHOLECYSTECTOMY    . COLONOSCOPY  10/2017  . DILATATION & CURETTAGE/HYSTEROSCOPY WITH MYOSURE N/A 12/10/2017   Procedure: DILATATION & CURETTAGE/HYSTEROSCOPY WITH MYOSURE;  Surgeon: Christophe Louis, MD;  Location: [facility];  Service: Gynecology;  Laterality: N/A;  POSSIBLE MYOMECTOMY VS POLYPECTOMY WITH MYOSURE  . DILATION AND CURETTAGE OF UTERUS    . ELBOW SURGERY    . JOINT REPLACEMENT    . KNEE ARTHROSCOPY     bilateral x 2   . KNEE ARTHROSCOPY Left 11/03/2013   Procedure: LEFT KNEE ARTHROSCOPY WITH DEBRIDEMENT, PARTIAL SYNOVECTOMY;  Surgeon: Mcarthur Rossetti, MD;  Location: [facility];  Service: Orthopedics;  Laterality: Left;  . LAPAROSCOPY    . left elbow surgery     . OTHER SURGICAL HISTORY     arthroswcopic surgery right knee   . OTHER SURGICAL HISTORY     arthroscopic surgery left knee x 2   . OTHER SURGICAL HISTORY      bunionectomy right foot   . OTHER SURGICAL HISTORY     C Section x 2   . OTHER SURGICAL HISTORY     carpal tunnel on left   . RADIOLOGY WITH ANESTHESIA N/A 07/28/2019   Procedure: MRI WITH ANESTHESIA OF NECK SOFT TISSUE ONLY WITH AND WITHOUT CONTRAST;  Surgeon: Radiologist, Medication, MD;  Location: [facility];  Service: Radiology;  Laterality: N/A;  . RADIOLOGY WITH ANESTHESIA N/A 10/04/2019   Procedure: MRI WITH ANESTHESIA RIGHT SHOULDER WITHOUT CONTRAST,MRI OF CERVICAL SPINE WITHOUT CONTRAST;  Surgeon: Radiologist, Medication, MD;  Location: [facility];  Service: Radiology;  Laterality: N/A;  . right foot bunionectomy     . ROBOTIC ASSISTED TOTAL HYSTERECTOMY WITH BILATERAL SALPINGO OOPHERECTOMY Bilateral 06/16/2018   Procedure: XI ROBOTIC ASSISTED TOTAL HYSTERECTOMY WITH RIGHT SALPINGO OOPHORECTOMY;  Surgeon: Christophe Louis, MD;  Location: [facility];  Service: Gynecology;  Laterality: Bilateral;  . TOTAL HIP ARTHROPLASTY  05/02/2011   Procedure: TOTAL HIP ARTHROPLASTY ANTERIOR APPROACH;  Surgeon: Mcarthur Rossetti, MD;  Location: [facility];  Service: Orthopedics;  Laterality: Left;  Left Total Hip Arthroplasty, Direct Anterior Approach  . TOTAL KNEE ARTHROPLASTY Right 12/03/2012   Procedure: RIGHT TOTAL KNEE ARTHROPLASTY;  Surgeon: Mcarthur Rossetti, MD;  Location: [facility];  Service:  Orthopedics;  Laterality: Right;  . TOTAL KNEE ARTHROPLASTY Left 12/15/2014   Procedure: LEFT TOTAL KNEE ARTHROPLASTY;  Surgeon: Kathryne Hitch, MD;  Location: [facility];  Service: Orthopedics;  Laterality: Left;  . TUBAL LIGATION    . ULNAR NERVE TRANSPOSITION     left   . UPPER GI ENDOSCOPY     x 2    Family History  Problem Relation Age of Onset  . Heart disease Mother    Social History:  reports that she has been smoking cigarettes. She has a 6.25 pack-year smoking history. She has never used smokeless tobacco. She reports current alcohol use. She reports that she does not use  drugs.  Allergies:  Allergies  Allergen Reactions  . Codeine Itching and Nausea Only    Medications Prior to Admission  Medication Sig Dispense Refill  . albuterol (VENTOLIN HFA) 108 (90 Base) MCG/ACT inhaler Inhale 2 puffs into the lungs every 6 (six) hours as needed for wheezing or shortness of breath. 8 g 2  . fluticasone (FLOVENT HFA) 44 MCG/ACT inhaler 2 puffs BID x 1 month, then 1 puff BID after that (Patient taking differently: Inhale 1 puff into the lungs in the morning and at bedtime.) 1 each 12  . HYDROcodone-acetaminophen (NORCO/VICODIN) 5-325 MG tablet Take 1 tablet by mouth every 6 (six) hours as needed for severe pain. 12 tablet 0    No results found for this or any previous visit (from the past 48 hour(s)). No results found.  Review of Systems  Constitutional: Positive for activity change.  HENT: Negative.   Respiratory: Positive for shortness of breath.   Cardiovascular: Negative.   Gastrointestinal: Negative.   Genitourinary: Negative.   Musculoskeletal: Positive for neck pain and neck stiffness.  Neurological: Positive for numbness.  Psychiatric/Behavioral: Negative.     Blood pressure (!) 169/92, pulse 95, temperature 98.8 F (37.1 C), temperature source Oral, resp. rate 18, height 5\' 7"  (1.702 m), weight 97.1 kg, SpO2 96 %. Physical Exam HENT:     Head: Normocephalic and atraumatic.  Eyes:     Extraocular Movements: Extraocular movements intact.     Pupils: Pupils are equal, round, and reactive to light.  Cardiovascular:     Heart sounds: Normal heart sounds.  Pulmonary:     Effort: Pulmonary effort is normal. No respiratory distress.  Abdominal:     General: Bowel sounds are normal. There is no distension.  Musculoskeletal:        General: Tenderness present.  Neurological:     Mental Status: She is alert and oriented to person, place, and time.  Psychiatric:        Mood and Affect: Mood normal.      Assessment/Plan Right C5-6  HNP/stenosis  We will proceed with surgery as scheduled.  Surgical procedure discussed and all questions answered.  Wishes to proceed.   , PA-C 04/02/2020, 6:41 AM

## 2020-04-03 ENCOUNTER — Encounter (HOSPITAL_COMMUNITY): Payer: Self-pay | Admitting: Specialist

## 2020-04-03 DIAGNOSIS — Z96653 Presence of artificial knee joint, bilateral: Secondary | ICD-10-CM | POA: Diagnosis not present

## 2020-04-03 DIAGNOSIS — J45909 Unspecified asthma, uncomplicated: Secondary | ICD-10-CM | POA: Diagnosis not present

## 2020-04-03 DIAGNOSIS — J449 Chronic obstructive pulmonary disease, unspecified: Secondary | ICD-10-CM | POA: Diagnosis not present

## 2020-04-03 DIAGNOSIS — Z7951 Long term (current) use of inhaled steroids: Secondary | ICD-10-CM | POA: Diagnosis not present

## 2020-04-03 DIAGNOSIS — M4722 Other spondylosis with radiculopathy, cervical region: Secondary | ICD-10-CM | POA: Diagnosis not present

## 2020-04-03 MED ORDER — HYDROCODONE-ACETAMINOPHEN 10-325 MG PO TABS
1.0000 | ORAL_TABLET | ORAL | 0 refills | Status: DC | PRN
Start: 2020-04-03 — End: 2020-05-16

## 2020-04-03 MED ORDER — GABAPENTIN 300 MG PO CAPS: 300.0000 mg | ORAL_CAPSULE | Freq: Three times a day (TID) | ORAL | 2 refills | Status: DC

## 2020-04-03 MED ORDER — DOCUSATE SODIUM 100 MG PO CAPS: 100.0000 mg | ORAL_CAPSULE | Freq: Two times a day (BID) | ORAL | 0 refills | Status: DC

## 2020-04-03 MED ORDER — METHOCARBAMOL 500 MG PO TABS: 500.0000 mg | ORAL_TABLET | Freq: Four times a day (QID) | ORAL | 1 refills | Status: DC | PRN

## 2020-04-03 MED FILL — Thrombin (Recombinant) For Soln 20000 Unit: CUTANEOUS | Qty: 1 | Status: AC

## 2020-04-03 NOTE — Progress Notes (Signed)
     Subjective: 1 Day Post-Op Procedure(s) (LRB): RIGHT C5-6 FORAMINOTOMY (N/A) Awake, alert and oriented x 4. Voiding without difficulty, no SOB, walking in hallway. Dressing caught up in her hair. C-collar is prn. Patient reports pain as moderate.    Objective:   VITALS:  Temp:  [97.6 F (36.4 C)-98.2 F (36.8 C)] 97.6 F (36.4 C) (12/28 0725) Pulse Rate:  [57-114] 57 (12/28 0725) Resp:  [8-20] 19 (12/28 0725) BP: (139-176)/(59-133) 166/90 (12/28 0725) SpO2:  [91 %-97 %] 97 % (12/28 0725)  Neurologically intact ABD soft Neurovascular intact Sensation intact distally Intact pulses distally Dorsiflexion/Plantar flexion intact Incision: scant drainage No cellulitis present Compartment soft   LABS No results for input(s): HGB, WBC, PLT in the last 72 hours. No results for input(s): NA, K, CL, CO2, BUN, CREATININE, GLUCOSE in the last 72 hours. No results for input(s): LABPT, INR in the last 72 hours.   Assessment/Plan: 1 Day Post-Op Procedure(s) (LRB): RIGHT C5-6 FORAMINOTOMY (N/A)  Advance diet Up with therapy Discharge home with home health  Katrina [name] 04/03/2020, 7:57 AM Patient ID: Katrina [name], female   DOB: [birth year], 64 y.o.   MRN: [id number]

## 2020-04-03 NOTE — Progress Notes (Signed)
Physical Therapy Treatment and Discharge Patient Details Name: Katrina [name] MRN: [id number] DOB: 09/16/1955 Today's Date: 04/03/2020    History of Present Illness The pt is a 64 yo female presenting s/p foraminotomy of C5-6 on 12/27 due to neck and RUE radiculopathy. PMH includes: arthritis of knees, hips, and elbows, COPD, anxiety, and 2 prior back surgeries, bilateral TKA, and L THA.    PT Comments    Pt progressing well with post-op mobility. She was able to demonstrate transfers and ambulation with gross modified independence and no AD. Pt was educated on precautions, brace application/wearing schedule, appropriate activity progression, and car transfer. Pt has met acute PT goals and will be discharged from acute PT services at this time. If needs change, please reconsult.     Follow Up Recommendations  No PT follow up;Supervision for mobility/OOB     Equipment Recommendations  None recommended by PT    Recommendations for Other Services       Precautions / Restrictions Precautions Precautions: Cervical Precaution Booklet Issued: Yes (comment) Precaution Comments: cervical precautions discussed verbally, and application to mobility such as stairs, bed mobility, and falls Required Braces or Orthoses: Cervical Brace Cervical Brace: Soft collar;At all times Restrictions Weight Bearing Restrictions: No    Mobility  Bed Mobility Overal bed mobility: Modified Independent Bed Mobility: Rolling;Sidelying to Sit           General bed mobility comments: No assist to perform log roll. Pt was cued for improved technique and able to make corrective changes.  Transfers Overall transfer level: Modified independent Equipment used: None Transfers: Sit to/from Stand           General transfer comment: No assist required. Pt was able to power-up to full stand without difficulty. Min cues for improved posture.  Ambulation/Gait Ambulation/Gait assistance: Modified  independent (Device/Increase time) Gait Distance (Feet): 400 Feet Assistive device: None Gait Pattern/deviations: Step-through pattern;Decreased stride length Gait velocity: Decreased Gait velocity interpretation: <1.31 ft/sec, indicative of household ambulator General Gait Details: Slow but generally steady without unsteadiness or LOB. Pt was able to ambulate well while conversing in the hallway.   Stairs         General stair comments: Pt declined further stair training. Reports she feels comfortable negotiating stairs at home.   Wheelchair Mobility    Modified Rankin (Stroke Patients Only)       Balance Overall balance assessment: Mild deficits observed, not formally tested                                          Cognition Arousal/Alertness: Awake/alert Behavior During Therapy: WFL for tasks assessed/performed Overall Cognitive Status: Within Functional Limits for tasks assessed                                        Exercises      General Comments        Pertinent Vitals/Pain Pain Assessment: Faces Faces Pain Scale: No hurt Pain Intervention(s): Monitored during session    Home Living                      Prior Function            PT Goals (current goals can now be found in the care plan section)  Acute Rehab PT Goals Patient Stated Goal: Go home PT Goal Formulation: With patient Time For Goal Achievement: 04/16/20 Potential to Achieve Goals: Good Progress towards PT goals: Progressing toward goals    Frequency    Min 5X/week      PT Plan Current plan remains appropriate    Co-evaluation              AM-PAC PT "6 Clicks" Mobility   Outcome Measure  Help needed turning from your back to your side while in a flat bed without using bedrails?: None Help needed moving from lying on your back to sitting on the side of a flat bed without using bedrails?: None Help needed moving to and from a  bed to a chair (including a wheelchair)?: None Help needed standing up from a chair using your arms (e.g., wheelchair or bedside chair)?: None Help needed to walk in hospital room?: None Help needed climbing 3-5 steps with a railing? : A Little 6 Click Score: 23    End of Session Equipment Utilized During Treatment: Cervical collar Activity Tolerance: Patient tolerated treatment well Patient left: in bed;with call bell/phone within reach Nurse Communication: Mobility status PT Visit Diagnosis: Other abnormalities of gait and mobility (R26.89);Pain;Unsteadiness on feet (R26.81) Pain - part of body:  (neck)     Time: 8088-1103 PT Time Calculation (min) (ACUTE ONLY): 13 min  Charges:  $Gait Training: 8-22 mins                     Rolinda Roan, PT, DPT Acute Rehabilitation Services Pager: [phone] Office: [phone]    Katrina [name] 04/03/2020, 10:47 AM

## 2020-04-03 NOTE — Progress Notes (Signed)
Discharged instructions/education/AVS/Rx given to patient and verbalized understanding. Pain is mild to moderate and controlled by PRN meds. No drainage, no swelling, no redness noted on incision site. Moving all extremities well. Ambulating independently. Swallowing with no issues. Discharged via wheelchair.

## 2020-04-03 NOTE — Discharge Instructions (Signed)
No lifting greater than 10 lbs. °Avoid bending, stooping and twisting. °Walking in house for first week then may start to get out slowly increasing activity using arms. °Keep incision dry for 3 days, may use tegaderm or similar water impervious dressing. °Avoid overhead use of arms and overhead lifting. °Wear collar for comfort. °Use ice as needed for comfort. °

## 2020-04-05 DIAGNOSIS — M79645 Pain in left finger(s): Secondary | ICD-10-CM | POA: Diagnosis not present

## 2020-04-13 NOTE — Discharge Summary (Signed)
Patient ID: NYRA ANSPAUGH MRN: [id number] DOB/AGE: November 08, 1955 65 y.o.  Admit date: 04/02/2020 Discharge date: 04/03/2020 Admission Diagnoses:  Principal Problem:   Other spondylosis with radiculopathy, cervical region   Discharge Diagnoses:  Principal Problem:   Other spondylosis with radiculopathy, cervical region  status post Procedure(s): RIGHT C5-6 FORAMINOTOMY  Past Medical History:  Diagnosis Date  . Anxiety   . Arthritis    knees, hips, elbows   . Asthma    hx of asthma 3/12- hospitalized   . COPD (chronic obstructive pulmonary disease) ([place])   . Depression   . GERD (gastroesophageal reflux disease)    PRIOR TO HAVING GALLBLADDER REMOVED  . Headache   . Left bundle branch block 06/28/2010  . Lung infection 2015  . Lung infection   . volvulus     previous history of twisted intestines    Surgeries: Procedure(s): RIGHT C5-6 FORAMINOTOMY on 04/02/2020   Consultants:   Discharged Condition: Improved  Hospital Course: QUITA MCGRORY is an 65 y.o. female who was admitted 04/02/2020 for operative treatment of Other spondylosis with radiculopathy, cervical region. Patient failed conservative treatments (please see the history and physical for the specifics) and had severe unremitting pain that affects sleep, daily activities and work/hobbies. After pre-op clearance, the patient was taken to the operating room on 04/02/2020 and underwent  Procedure(s): RIGHT C5-6 FORAMINOTOMY.    Patient was given perioperative antibiotics:  Anti-infectives (From admission, onward)   Start     Dose/Rate Route Frequency Ordered Stop   04/02/20 1800  ceFAZolin (ANCEF) IVPB 2g/100 mL premix        2 g 200 mL/hr over 30 Minutes Intravenous Every 8 hours 04/02/20 1212 04/03/20 0248   04/02/20 0645  ceFAZolin (ANCEF) IVPB 2g/100 mL premix        2 g 200 mL/hr over 30 Minutes Intravenous On call to O.R. 04/02/20 9326 04/02/20 0905       Patient was given sequential  compression devices and early ambulation to prevent DVT.   Patient benefited maximally from hospital stay and there were no complications. At the time of discharge, the patient was urinating/moving their bowels without difficulty, tolerating a regular diet, pain is controlled with oral pain medications and they have been cleared by PT/OT.   Recent vital signs: No data found.   Recent laboratory studies: No results for input(s): WBC, HGB, HCT, PLT, NA, K, CL, CO2, BUN, CREATININE, GLUCOSE, INR, CALCIUM in the last 72 hours.  Invalid input(s): PT, 2   Discharge Medications:   Allergies as of 04/03/2020      Reactions   Codeine Itching, Nausea Only      Medication List    STOP taking these medications   HYDROcodone-acetaminophen 5-325 MG tablet Commonly known as: NORCO/VICODIN Replaced by: HYDROcodone-acetaminophen 10-325 MG tablet     TAKE these medications   albuterol 108 (90 Base) MCG/ACT inhaler Commonly known as: VENTOLIN HFA Inhale 2 puffs into the lungs every 6 (six) hours as needed for wheezing or shortness of breath.   docusate sodium 100 MG capsule Commonly known as: COLACE Take 1 capsule (100 mg total) by mouth 2 (two) times daily.   Flovent HFA 44 MCG/ACT inhaler Generic drug: fluticasone 2 puffs BID x 1 month, then 1 puff BID after that What changed:   how much to take  how to take this  when to take this  additional instructions   gabapentin 300 MG capsule Commonly known as: NEURONTIN Take 1 capsule (300  mg total) by mouth 3 (three) times daily.   HYDROcodone-acetaminophen 10-325 MG tablet Commonly known as: NORCO Take 1 tablet by mouth every 4 (four) hours as needed for moderate pain ((score 4 to 6)). Replaces: HYDROcodone-acetaminophen 5-325 MG tablet   methocarbamol 500 MG tablet Commonly known as: ROBAXIN Take 1 tablet (500 mg total) by mouth every 6 (six) hours as needed for muscle spasms.       Diagnostic Studies: DG Cervical Spine 1  View  Result Date: 04/02/2020 CLINICAL DATA:  C5-C6 foraminotomy EXAM: DG C-ARM 1-60 MIN; DG CERVICAL SPINE - 1 VIEW FLUOROSCOPY TIME:  Fluoroscopy Time:  21 seconds. Radiation: 5.85 mGy. COMPARISON:  Same day intraoperative fluoroscopic image. FINDINGS: Single C-arm fluoroscopic images were obtained intraoperatively and submitted for post operative interpretation. This image demonstrates surgical instruments projecting in the posterior paraspinal soft tissues at the C4 spinous process. Small field of view limits evaluation. Please see the performing provider's procedural report for further detail. IMPRESSION: Intraoperative fluoroscopic image, as above. Electronically Signed   By: Margaretha Sheffield MD   On: 04/02/2020 11:21   DG Cervical Spine 1 View  Result Date: 04/02/2020 CLINICAL DATA:  65 year old female undergoing intraoperative cervical spine localization. EXAM: DG CERVICAL SPINE - 1 VIEW COMPARISON:  Cervical spine radiographs 08/24/2019. FINDINGS: Lateral coned-down intraoperative fluoroscopic spot view of the upper cervical spine at 0924 hours. A surgical probe is directed toward the C4 spinous process. The tip could be at or just below the C4 spinous process. Straightening of upper cervical vertebrae is stable from May. No fluoroscopy time specified. IMPRESSION: Intraoperative localization at or just below the C4 spinous process. Study discussed by telephone with Dr. Basil Dess on 04/02/2020 at 09:51 . Electronically Signed   By: Genevie Ann M.D.   On: 04/02/2020 09:52   DG C-Arm 1-60 Min  Result Date: 04/02/2020 CLINICAL DATA:  C5-C6 foraminotomy EXAM: DG C-ARM 1-60 MIN; DG CERVICAL SPINE - 1 VIEW FLUOROSCOPY TIME:  Fluoroscopy Time:  21 seconds. Radiation: 5.85 mGy. COMPARISON:  Same day intraoperative fluoroscopic image. FINDINGS: Single C-arm fluoroscopic images were obtained intraoperatively and submitted for post operative interpretation. This image demonstrates surgical instruments  projecting in the posterior paraspinal soft tissues at the C4 spinous process. Small field of view limits evaluation. Please see the performing provider's procedural report for further detail. IMPRESSION: Intraoperative fluoroscopic image, as above. Electronically Signed   By: Margaretha Sheffield MD   On: 04/02/2020 11:21    Discharge Instructions    Call MD / Call 911   Complete by: As directed    If you experience chest pain or shortness of breath, CALL 911 and be transported to the hospital emergency room.  If you develope a fever above 101 F, pus (white drainage) or increased drainage or redness at the wound, or calf pain, call your surgeon's office.   Constipation Prevention   Complete by: As directed    Drink plenty of fluids.  Prune juice may be helpful.  You may use a stool softener, such as Colace (over the counter) 100 mg twice a day.  Use MiraLax (over the counter) for constipation as needed.   Diet - low sodium heart healthy   Complete by: As directed    Discharge instructions   Complete by: As directed    No lifting greater than 10 lbs. Avoid bending, stooping and twisting. Walking in house for first week then may start to get out slowly increasing activity using arms. Keep incision dry for  3 days, may use tegaderm or similar water impervious dressing. Avoid overhead use of arms and overhead lifting. Wear collar for comfort. Use ice as needed for comfort.   Driving restrictions   Complete by: As directed    No driving for 2 weeks   Increase activity slowly as tolerated   Complete by: As directed    Lifting restrictions   Complete by: As directed    No lifting for 8 weeks       Follow-up Information    Jessy Oto, MD In 2 weeks.   Specialty: Orthopedic Surgery Why: For wound re-check Contact information: [address] [place] [address] [phone]               Discharge Plan:  discharge to home  Disposition:     Signed: Benjiman Core   04/13/2020, 11:03 AM

## 2020-04-18 ENCOUNTER — Ambulatory Visit (INDEPENDENT_AMBULATORY_CARE_PROVIDER_SITE_OTHER): Payer: Self-pay | Admitting: Surgery

## 2020-04-18 ENCOUNTER — Encounter: Payer: Self-pay | Admitting: Surgery

## 2020-04-18 ENCOUNTER — Other Ambulatory Visit: Payer: Self-pay

## 2020-04-18 VITALS — BP 158/88 | HR 85 | Temp 98.4°F | Ht 67.0 in | Wt 214.8 lb

## 2020-04-18 DIAGNOSIS — Z9889 Other specified postprocedural states: Secondary | ICD-10-CM

## 2020-04-18 NOTE — Progress Notes (Signed)
Post-Op Visit Note   Patient: Katrina [name]           Date of Birth: 12-26-1955           MRN: [id number] Visit Date: 04/18/2020 PCP: Eunice Blase, MD   Assessment & Plan:  Chief Complaint:  Chief Complaint  Patient presents with  . Neck - Routine Post Op   Patient returns.  2 weeks s/p C5-6 foraminotomy.  Doing well.  preop symptoms right arm greatly improved.  Continues to have some right thumb numbness which she states is worse than preop.   Visit Diagnoses:  1. H/O excision of lamina of cervical vertebra for decompression of spinal cord     Plan: follow up with dr Louanne Skye 4 weeks for recheck.  Advised No aggressive activity.  No lifting, pushing, pulling. Voices understanding.   Follow-Up Instructions: Return in about 4 weeks (around 05/16/2020) for with dr Louanne Skye for 6 weeks postop check .   Orders:  No orders of the defined types were placed in this encounter.  No orders of the defined types were placed in this encounter.   Imaging: No results found.  PMFS History: Patient Active Problem List   Diagnosis Date Noted  . Other spondylosis with radiculopathy, cervical region 04/02/2020    Class: Chronic  . Lung nodules 11/30/2019  . Neck pain 08/04/2019  . Acute pain of right shoulder 04/27/2019  . Neck mass 04/27/2019  . Postmenopausal bleeding 06/16/2018  . S/P hysterectomy 06/16/2018  . Chronic right shoulder pain 10/28/2016  . Impingement syndrome of right shoulder 10/28/2016  . Osteoarthritis of left knee 12/15/2014  . Status post total left knee replacement 12/15/2014  . Hand pain, left 04/21/2014  . Cat bite of finger 04/21/2014  . Asthma 04/21/2014  . Tobacco abuse 04/21/2014  . COPD (chronic obstructive pulmonary disease) ([place])   . Effusion of left knee joint 11/03/2013  . Arthritis of right knee 12/03/2012  . Degenerative arthritis of hip 05/02/2011   Past Medical History:  Diagnosis Date  . Anxiety   . Arthritis    knees, hips, elbows    . Asthma    hx of asthma 3/12- hospitalized   . COPD (chronic obstructive pulmonary disease) ([place])   . Depression   . GERD (gastroesophageal reflux disease)    PRIOR TO HAVING GALLBLADDER REMOVED  . Headache   . Left bundle branch block 06/28/2010  . Lung infection 2015  . Lung infection   . volvulus     previous history of twisted intestines    Family History  Problem Relation Age of Onset  . Heart disease Mother     Past Surgical History:  Procedure Laterality Date  . BACK SURGERY     x2  . CARPAL TUNNEL RELEASE     left   . CESAREAN SECTION     x 2  . CHOLECYSTECTOMY    . COLONOSCOPY  10/2017  . DILATATION & CURETTAGE/HYSTEROSCOPY WITH MYOSURE N/A 12/10/2017   Procedure: DILATATION & CURETTAGE/HYSTEROSCOPY WITH MYOSURE;  Surgeon: Christophe Louis, MD;  Location: [facility];  Service: Gynecology;  Laterality: N/A;  POSSIBLE MYOMECTOMY VS POLYPECTOMY WITH MYOSURE  . DILATION AND CURETTAGE OF UTERUS    . ELBOW SURGERY    . JOINT REPLACEMENT    . KNEE ARTHROSCOPY     bilateral x 2   . KNEE ARTHROSCOPY Left 11/03/2013   Procedure: LEFT KNEE ARTHROSCOPY WITH DEBRIDEMENT, PARTIAL SYNOVECTOMY;  Surgeon: Mcarthur Rossetti, MD;  Location: [facility];  Service: Orthopedics;  Laterality: Left;  . LAPAROSCOPY    . left elbow surgery     . OTHER SURGICAL HISTORY     arthroswcopic surgery right knee   . OTHER SURGICAL HISTORY     arthroscopic surgery left knee x 2   . OTHER SURGICAL HISTORY     bunionectomy right foot   . OTHER SURGICAL HISTORY     C Section x 2   . OTHER SURGICAL HISTORY     carpal tunnel on left   . POSTERIOR CERVICAL FUSION/FORAMINOTOMY N/A 04/02/2020   Procedure: RIGHT C5-6 FORAMINOTOMY;  Surgeon: Jessy Oto, MD;  Location: [facility];  Service: Orthopedics;  Laterality: N/A;  . RADIOLOGY WITH ANESTHESIA N/A 07/28/2019   Procedure: MRI WITH ANESTHESIA OF NECK SOFT TISSUE ONLY WITH AND WITHOUT CONTRAST;  Surgeon: Radiologist, Medication, MD;  Location: [facility];  Service:  Radiology;  Laterality: N/A;  . RADIOLOGY WITH ANESTHESIA N/A 10/04/2019   Procedure: MRI WITH ANESTHESIA RIGHT SHOULDER WITHOUT CONTRAST,MRI OF CERVICAL SPINE WITHOUT CONTRAST;  Surgeon: Radiologist, Medication, MD;  Location: [facility];  Service: Radiology;  Laterality: N/A;  . right foot bunionectomy     . ROBOTIC ASSISTED TOTAL HYSTERECTOMY WITH BILATERAL SALPINGO OOPHERECTOMY Bilateral 06/16/2018   Procedure: XI ROBOTIC ASSISTED TOTAL HYSTERECTOMY WITH RIGHT SALPINGO OOPHORECTOMY;  Surgeon: Christophe Louis, MD;  Location: [facility];  Service: Gynecology;  Laterality: Bilateral;  . TOTAL HIP ARTHROPLASTY  05/02/2011   Procedure: TOTAL HIP ARTHROPLASTY ANTERIOR APPROACH;  Surgeon: Mcarthur Rossetti, MD;  Location: [facility];  Service: Orthopedics;  Laterality: Left;  Left Total Hip Arthroplasty, Direct Anterior Approach  . TOTAL KNEE ARTHROPLASTY Right 12/03/2012   Procedure: RIGHT TOTAL KNEE ARTHROPLASTY;  Surgeon: Mcarthur Rossetti, MD;  Location: [facility];  Service: Orthopedics;  Laterality: Right;  . TOTAL KNEE ARTHROPLASTY Left 12/15/2014   Procedure: LEFT TOTAL KNEE ARTHROPLASTY;  Surgeon: Mcarthur Rossetti, MD;  Location: [facility];  Service: Orthopedics;  Laterality: Left;  . TUBAL LIGATION    . ULNAR NERVE TRANSPOSITION     left   . UPPER GI ENDOSCOPY     x 2   Social History   Occupational History  . Not on file  Tobacco Use  . Smoking status: Current Every Day Smoker    Packs/day: 0.25    Years: 25.00    Pack years: 6.25    Types: Cigarettes  . Smokeless tobacco: Never Used  . Tobacco comment: Pt reports that she is in the process of quitting  Vaping Use  . Vaping Use: Never used  Substance and Sexual Activity  . Alcohol use: Yes    Comment: occasional beer   . Drug use: No    Comment: hx of 34 years ago marijuana   . Sexual activity: Not on file   Exam Pleasant female. Alert and oriented. Staples removed and steri strips applied.  No drainage or signs  of infection.  Neurologically intact.

## 2020-05-03 DIAGNOSIS — M79645 Pain in left finger(s): Secondary | ICD-10-CM | POA: Diagnosis not present

## 2020-05-16 ENCOUNTER — Encounter: Payer: Self-pay | Admitting: Specialist

## 2020-05-16 ENCOUNTER — Ambulatory Visit (INDEPENDENT_AMBULATORY_CARE_PROVIDER_SITE_OTHER): Payer: Medicare Other | Admitting: Specialist

## 2020-05-16 ENCOUNTER — Other Ambulatory Visit: Payer: Self-pay

## 2020-05-16 VITALS — BP 167/95 | HR 80 | Ht 67.0 in | Wt 215.0 lb

## 2020-05-16 DIAGNOSIS — M25511 Pain in right shoulder: Secondary | ICD-10-CM

## 2020-05-16 DIAGNOSIS — M961 Postlaminectomy syndrome, not elsewhere classified: Secondary | ICD-10-CM

## 2020-05-16 MED ORDER — HYDROCODONE-ACETAMINOPHEN 10-325 MG PO TABS
1.0000 | ORAL_TABLET | ORAL | 0 refills | Status: DC | PRN
Start: 2020-05-16 — End: 2021-02-20

## 2020-05-16 NOTE — Progress Notes (Signed)
Post-Op Visit Note   Patient: Katrina [name]           Date of Birth: [birth year]           MRN: [id number] Visit Date: 05/16/2020 PCP: Eunice Blase, MD   Assessment & Plan:4 weeks post op right C5-6 foraminotomy  Chief Complaint:  Chief Complaint  Patient presents with  . Neck - Routine Post Op   Visit Diagnoses: No diagnosis found. Incision is healed no fluctuance, or eyrthrema.  Motor is intact.  Plan: Avoid overhead lifting and overhead use of the arms. Do not lift greater than 5-10lbs. Adjust head rest in vehicle to prevent hyperextension if rear ended. Take extra precautions to avoid falling. Consider PT if the area of pain remains focal to the right shoulder blade. May need to consider a trigger point injection.  Follow-Up Instructions: No follow-ups on file.   Orders:  No orders of the defined types were placed in this encounter.  No orders of the defined types were placed in this encounter.   Imaging: No results found.  PMFS History: Patient Active Problem List   Diagnosis Date Noted  . Other spondylosis with radiculopathy, cervical region 04/02/2020    Priority: High    Class: Chronic  . Lung nodules 11/30/2019  . Neck pain 08/04/2019  . Acute pain of right shoulder 04/27/2019  . Neck mass 04/27/2019  . Postmenopausal bleeding 06/16/2018  . S/P hysterectomy 06/16/2018  . Chronic right shoulder pain 10/28/2016  . Impingement syndrome of right shoulder 10/28/2016  . Osteoarthritis of left knee 12/15/2014  . Status post total left knee replacement 12/15/2014  . Hand pain, left 04/21/2014  . Cat bite of finger 04/21/2014  . Asthma 04/21/2014  . Tobacco abuse 04/21/2014  . COPD (chronic obstructive pulmonary disease) ([place])   . Effusion of left knee joint 11/03/2013  . Arthritis of right knee 12/03/2012  . Degenerative arthritis of hip 05/02/2011   Past Medical History:  Diagnosis Date  . Anxiety   . Arthritis    knees, hips, elbows   .  Asthma    hx of asthma 3/12- hospitalized   . COPD (chronic obstructive pulmonary disease) ([place])   . Depression   . GERD (gastroesophageal reflux disease)    PRIOR TO HAVING GALLBLADDER REMOVED  . Headache   . Left bundle branch block 06/28/2010  . Lung infection 2015  . Lung infection   . volvulus     previous history of twisted intestines    Family History  Problem Relation Age of Onset  . Heart disease Mother     Past Surgical History:  Procedure Laterality Date  . BACK SURGERY     x2  . CARPAL TUNNEL RELEASE     left   . CESAREAN SECTION     x 2  . CHOLECYSTECTOMY    . COLONOSCOPY  10/2017  . DILATATION & CURETTAGE/HYSTEROSCOPY WITH MYOSURE N/A 12/10/2017   Procedure: DILATATION & CURETTAGE/HYSTEROSCOPY WITH MYOSURE;  Surgeon: Christophe Louis, MD;  Location: [facility];  Service: Gynecology;  Laterality: N/A;  POSSIBLE MYOMECTOMY VS POLYPECTOMY WITH MYOSURE  . DILATION AND CURETTAGE OF UTERUS    . ELBOW SURGERY    . JOINT REPLACEMENT    . KNEE ARTHROSCOPY     bilateral x 2   . KNEE ARTHROSCOPY Left 11/03/2013   Procedure: LEFT KNEE ARTHROSCOPY WITH DEBRIDEMENT, PARTIAL SYNOVECTOMY;  Surgeon: Mcarthur Rossetti, MD;  Location: [facility];  Service: Orthopedics;  Laterality: Left;  .  LAPAROSCOPY    . left elbow surgery     . OTHER SURGICAL HISTORY     arthroswcopic surgery right knee   . OTHER SURGICAL HISTORY     arthroscopic surgery left knee x 2   . OTHER SURGICAL HISTORY     bunionectomy right foot   . OTHER SURGICAL HISTORY     C Section x 2   . OTHER SURGICAL HISTORY     carpal tunnel on left   . POSTERIOR CERVICAL FUSION/FORAMINOTOMY N/A 04/02/2020   Procedure: RIGHT C5-6 FORAMINOTOMY;  Surgeon: Jessy Oto, MD;  Location: [facility];  Service: Orthopedics;  Laterality: N/A;  . RADIOLOGY WITH ANESTHESIA N/A 07/28/2019   Procedure: MRI WITH ANESTHESIA OF NECK SOFT TISSUE ONLY WITH AND WITHOUT CONTRAST;  Surgeon: Radiologist, Medication, MD;  Location: [facility];  Service:  Radiology;  Laterality: N/A;  . RADIOLOGY WITH ANESTHESIA N/A 10/04/2019   Procedure: MRI WITH ANESTHESIA RIGHT SHOULDER WITHOUT CONTRAST,MRI OF CERVICAL SPINE WITHOUT CONTRAST;  Surgeon: Radiologist, Medication, MD;  Location: [facility];  Service: Radiology;  Laterality: N/A;  . right foot bunionectomy     . ROBOTIC ASSISTED TOTAL HYSTERECTOMY WITH BILATERAL SALPINGO OOPHERECTOMY Bilateral 06/16/2018   Procedure: XI ROBOTIC ASSISTED TOTAL HYSTERECTOMY WITH RIGHT SALPINGO OOPHORECTOMY;  Surgeon: Christophe Louis, MD;  Location: [facility];  Service: Gynecology;  Laterality: Bilateral;  . TOTAL HIP ARTHROPLASTY  05/02/2011   Procedure: TOTAL HIP ARTHROPLASTY ANTERIOR APPROACH;  Surgeon: Mcarthur Rossetti, MD;  Location[facility];  Service: Orthopedics;  Laterality: Left;  Left Total Hip Arthroplasty, Direct Anterior Approach  . TOTAL KNEE ARTHROPLASTY Right 12/03/2012   Procedure: RIGHT TOTAL KNEE ARTHROPLASTY;  Surgeon: Mcarthur Rossetti, MD;  Location[facility];  Service: Orthopedics;  Laterality: Right;  . TOTAL KNEE ARTHROPLASTY Left 12/15/2014   Procedure: LEFT TOTAL KNEE ARTHROPLASTY;  Surgeon: Mcarthur Rossetti, MD;  Location: [facility];  Service: Orthopedics;  Laterality: Left;  . TUBAL LIGATION    . ULNAR NERVE TRANSPOSITION     left   . UPPER GI ENDOSCOPY     x 2   Social History   Occupational History  . Not on file  Tobacco Use  . Smoking status: Current Every Day Smoker    Packs/day: 0.25    Years: 25.00    Pack years: 6.25    Types: Cigarettes  . Smokeless tobacco: Never Used  . Tobacco comment: Pt reports that she is in the process of quitting  Vaping Use  . Vaping Use: Never used  Substance and Sexual Activity  . Alcohol use: Yes    Comment: occasional beer   . Drug use: No    Comment: hx of 34 years ago marijuana   . Sexual activity: Not on file

## 2020-05-16 NOTE — Patient Instructions (Addendum)
Plan: Avoid overhead lifting and overhead use of the arms. Do not lift greater than 5-10lbs. Adjust head rest in vehicle to prevent hyperextension if rear ended. Take extra precautions to avoid falling. Consider PT if the area of pain remains focal to the right shoulder blade. May need to consider a trigger point injection. Hydrocodone is renewed but will not be renewed after 2-3 weeks.

## 2020-06-07 ENCOUNTER — Encounter: Payer: Self-pay | Admitting: Family Medicine

## 2020-06-07 ENCOUNTER — Other Ambulatory Visit: Payer: Self-pay

## 2020-06-07 ENCOUNTER — Ambulatory Visit (INDEPENDENT_AMBULATORY_CARE_PROVIDER_SITE_OTHER): Payer: Medicare Other | Admitting: Family Medicine

## 2020-06-07 DIAGNOSIS — M25572 Pain in left ankle and joints of left foot: Secondary | ICD-10-CM

## 2020-06-07 DIAGNOSIS — Z720 Tobacco use: Secondary | ICD-10-CM | POA: Diagnosis not present

## 2020-06-07 MED ORDER — ALBUTEROL SULFATE HFA 108 (90 BASE) MCG/ACT IN AERS: 2.0000 | INHALATION_SPRAY | Freq: Four times a day (QID) | RESPIRATORY_TRACT | 11 refills | Status: DC | PRN

## 2020-06-07 MED ORDER — FLOVENT HFA 44 MCG/ACT IN AERO: 1.0000 | INHALATION_SPRAY | Freq: Two times a day (BID) | RESPIRATORY_TRACT | 11 refills | Status: DC

## 2020-06-07 NOTE — Progress Notes (Signed)
Office Visit Note   Patient: Katrina [name]           Date of Birth: [birth year]           MRN: [id number] Visit Date: 06/07/2020 Requested by: Eunice Blase, MD [address] [place],  [place] [address] PCP: Eunice Blase, MD  Subjective: Chief Complaint  Patient presents with  . Left Foot - Pain    Pain plantar aspect of foot. She says she was told she had a plantars wart, around the first MT. Callous has developed over it. Hurts to put weight on that area, so has developed callous around the 5th MT.    HPI: She is here with left foot pain.  Pain on the plantar aspect of her foot near the first and fifth MTP joints.  Hurts to bear weight, no pain at rest.  She has struggled with callus formation for a long time.  She has tried filing the skin and medicated corn pads.  She is also wanting to quit smoking.              ROS:   All other systems were reviewed and are negative.  Objective: Vital Signs: There were no vitals taken for this visit.  Physical Exam:  General:  Alert and oriented, in no acute distress. Pulm:  Breathing unlabored. Psy:  Normal mood, congruent affect. Skin: She has hypertrophic callus underneath the first and fifth MTP joints.  These were pared, she has a corns in both areas.  No sign of plantar wart.   Imaging: No results found.  Assessment & Plan: 1.  Left foot corn, first and fifth MTP -She felt better after paring.  If symptoms recur, could contemplate surgical consult per Dr. Sharol Given.  2.  Tobacco abuse -We discussed options including tapering, nicotine patch, Wellbutrin or Chantix.  She will think about these options.     Procedures: No procedures performed        PMFS History: Patient Active Problem List   Diagnosis Date Noted  . Other spondylosis with radiculopathy, cervical region 04/02/2020    Class: Chronic  . Pain in finger of left hand 02/24/2020  . Lung nodules 11/30/2019  . Neck pain 08/04/2019  . Acute pain of right  shoulder 04/27/2019  . Neck mass 04/27/2019  . Postmenopausal bleeding 06/16/2018  . S/P hysterectomy 06/16/2018  . Chronic right shoulder pain 10/28/2016  . Impingement syndrome of right shoulder 10/28/2016  . Osteoarthritis of left knee 12/15/2014  . Status post total left knee replacement 12/15/2014  . Hand pain, left 04/21/2014  . Cat bite of finger 04/21/2014  . Asthma 04/21/2014  . Tobacco abuse 04/21/2014  . COPD (chronic obstructive pulmonary disease) ([place])   . Effusion of left knee joint 11/03/2013  . Arthritis of right knee 12/03/2012  . Degenerative arthritis of hip 05/02/2011   Past Medical History:  Diagnosis Date  . Anxiety   . Arthritis    knees, hips, elbows   . Asthma    hx of asthma 3/12- hospitalized   . COPD (chronic obstructive pulmonary disease) ([place])   . Depression   . GERD (gastroesophageal reflux disease)    PRIOR TO HAVING GALLBLADDER REMOVED  . Headache   . Left bundle branch block 06/28/2010  . Lung infection 2015  . Lung infection   . volvulus     previous history of twisted intestines    Family History  Problem Relation Age of Onset  . Heart disease Mother  Past Surgical History:  Procedure Laterality Date  . BACK SURGERY     x2  . CARPAL TUNNEL RELEASE     left   . CESAREAN SECTION     x 2  . CHOLECYSTECTOMY    . COLONOSCOPY  10/2017  . DILATATION & CURETTAGE/HYSTEROSCOPY WITH MYOSURE N/A 12/10/2017   Procedure: DILATATION & CURETTAGE/HYSTEROSCOPY WITH MYOSURE;  Surgeon: Christophe Louis, MD;  Location: [facility];  Service: Gynecology;  Laterality: N/A;  POSSIBLE MYOMECTOMY VS POLYPECTOMY WITH MYOSURE  . DILATION AND CURETTAGE OF UTERUS    . ELBOW SURGERY    . JOINT REPLACEMENT    . KNEE ARTHROSCOPY     bilateral x 2   . KNEE ARTHROSCOPY Left 11/03/2013   Procedure: LEFT KNEE ARTHROSCOPY WITH DEBRIDEMENT, PARTIAL SYNOVECTOMY;  Surgeon: Mcarthur Rossetti, MD;  [facility];  Service: Orthopedics;  Laterality: Left;  . LAPAROSCOPY     . left elbow surgery     . OTHER SURGICAL HISTORY     arthroswcopic surgery right knee   . OTHER SURGICAL HISTORY     arthroscopic surgery left knee x 2   . OTHER SURGICAL HISTORY     bunionectomy right foot   . OTHER SURGICAL HISTORY     C Section x 2   . OTHER SURGICAL HISTORY     carpal tunnel on left   . POSTERIOR CERVICAL FUSION/FORAMINOTOMY N/A 04/02/2020   Procedure: RIGHT C5-6 FORAMINOTOMY;  Surgeon: Jessy Oto, MD;  Location: [facility];  Service: Orthopedics;  Laterality: N/A;  . RADIOLOGY WITH ANESTHESIA N/A 07/28/2019   Procedure: MRI WITH ANESTHESIA OF NECK SOFT TISSUE ONLY WITH AND WITHOUT CONTRAST;  Surgeon: Radiologist, Medication, MD;  Location: [facility];  Service: Radiology;  Laterality: N/A;  . RADIOLOGY WITH ANESTHESIA N/A 10/04/2019   Procedure: MRI WITH ANESTHESIA RIGHT SHOULDER WITHOUT CONTRAST,MRI OF CERVICAL SPINE WITHOUT CONTRAST;  Surgeon: Radiologist, Medication, MD;  Location: [facility];  Service: Radiology;  Laterality: N/A;  . right foot bunionectomy     . ROBOTIC ASSISTED TOTAL HYSTERECTOMY WITH BILATERAL SALPINGO OOPHERECTOMY Bilateral 06/16/2018   Procedure: XI ROBOTIC ASSISTED TOTAL HYSTERECTOMY WITH RIGHT SALPINGO OOPHORECTOMY;  Surgeon: Christophe Louis, MD;  Location: [facility];  Service: Gynecology;  Laterality: Bilateral;  . TOTAL HIP ARTHROPLASTY  05/02/2011   Procedure: TOTAL HIP ARTHROPLASTY ANTERIOR APPROACH;  Surgeon: Mcarthur Rossetti, MD;  Location[facility];  Service: Orthopedics;  Laterality: Left;  Left Total Hip Arthroplasty, Direct Anterior Approach  . TOTAL KNEE ARTHROPLASTY Right 12/03/2012   Procedure: RIGHT TOTAL KNEE ARTHROPLASTY;  Surgeon: Mcarthur Rossetti, MD;  Location[facility];  Service: Orthopedics;  Laterality: Right;  . TOTAL KNEE ARTHROPLASTY Left 12/15/2014   Procedure: LEFT TOTAL KNEE ARTHROPLASTY;  Surgeon: Mcarthur Rossetti, MD;  Location[facility];  Service: Orthopedics;  Laterality: Left;  . TUBAL LIGATION     . ULNAR NERVE TRANSPOSITION     left   . UPPER GI ENDOSCOPY     x 2   Social History   Occupational History  . Not on file  Tobacco Use  . Smoking status: Current Every Day Smoker    Packs/day: 0.25    Years: 25.00    Pack years: 6.25    Types: Cigarettes  . Smokeless tobacco: Never Used  . Tobacco comment: Pt reports that she is in the process of quitting  Vaping Use  . Vaping Use: Never used  Substance and Sexual Activity  . Alcohol use: Yes  Comment: occasional beer   . Drug use: No    Comment: hx of 34 years ago marijuana   . Sexual activity: Not on file

## 2020-06-13 ENCOUNTER — Other Ambulatory Visit: Payer: Self-pay

## 2020-06-13 ENCOUNTER — Ambulatory Visit (INDEPENDENT_AMBULATORY_CARE_PROVIDER_SITE_OTHER): Payer: Medicare Other | Admitting: Specialist

## 2020-06-13 ENCOUNTER — Ambulatory Visit: Payer: Self-pay

## 2020-06-13 ENCOUNTER — Encounter: Payer: Self-pay | Admitting: Specialist

## 2020-06-13 VITALS — BP 152/91 | HR 90 | Ht 67.0 in | Wt 215.0 lb

## 2020-06-13 DIAGNOSIS — M5412 Radiculopathy, cervical region: Secondary | ICD-10-CM

## 2020-06-13 NOTE — Progress Notes (Signed)
Post-Op Visit Note   Patient: Katrina [name]           Date of Birth: 05/12/1955           MRN: [id number] Visit Date: 06/13/2020 PCP: Eunice Blase, MD   Assessment & Plan:3 month post op right C5-6 Foraminotomy  Chief Complaint:  Chief Complaint  Patient presents with  . Neck - Follow-up  Atrophy right neck paraspinous muscle Right biceps 5-/5 Incision is healed  Right C5 thumb numbness Visit Diagnoses:  1. Cervical radiculopathy     Plan:Avoid overhead lifting and overhead use of the arms. Pillows to keep from sleeping directly on the shoulders Limited lifting to less than 10 lbs. Ice or heat for relief. NSAIDs are helpful, such as alleve or motrin, be careful not to use in excess as they place burdens on the kidney. Stretching exercise help and strengthening is helpful to build endurance. Repeat right UE EMG/NCV, she is post op right C6 foramenotomy, assess for any improvement in function.   Follow-Up Instructions: No follow-ups on file.   Orders:  No orders of the defined types were placed in this encounter.  No orders of the defined types were placed in this encounter.   Imaging: No results found.  PMFS History: Patient Active Problem List   Diagnosis Date Noted  . Other spondylosis with radiculopathy, cervical region 04/02/2020    Priority: High    Class: Chronic  . Pain in finger of left hand 02/24/2020  . Lung nodules 11/30/2019  . Neck pain 08/04/2019  . Acute pain of right shoulder 04/27/2019  . Neck mass 04/27/2019  . Postmenopausal bleeding 06/16/2018  . S/P hysterectomy 06/16/2018  . Chronic right shoulder pain 10/28/2016  . Impingement syndrome of right shoulder 10/28/2016  . Osteoarthritis of left knee 12/15/2014  . Status post total left knee replacement 12/15/2014  . Hand pain, left 04/21/2014  . Cat bite of finger 04/21/2014  . Asthma 04/21/2014  . Tobacco abuse 04/21/2014  . COPD (chronic obstructive pulmonary disease) ([place])    . Effusion of left knee joint 11/03/2013  . Arthritis of right knee 12/03/2012  . Degenerative arthritis of hip 05/02/2011   Past Medical History:  Diagnosis Date  . Anxiety   . Arthritis    knees, hips, elbows   . Asthma    hx of asthma 3/12- hospitalized   . COPD (chronic obstructive pulmonary disease) ([place])   . Depression   . GERD (gastroesophageal reflux disease)    PRIOR TO HAVING GALLBLADDER REMOVED  . Headache   . Left bundle branch block 06/28/2010  . Lung infection 2015  . Lung infection   . volvulus     previous history of twisted intestines    Family History  Problem Relation Age of Onset  . Heart disease Mother     Past Surgical History:  Procedure Laterality Date  . BACK SURGERY     x2  . CARPAL TUNNEL RELEASE     left   . CESAREAN SECTION     x 2  . CHOLECYSTECTOMY    . COLONOSCOPY  10/2017  . DILATATION & CURETTAGE/HYSTEROSCOPY WITH MYOSURE N/A 12/10/2017   Procedure: DILATATION & CURETTAGE/HYSTEROSCOPY WITH MYOSURE;  Surgeon: Christophe Louis, MD;  Location: [facility];  Service: Gynecology;  Laterality: N/A;  POSSIBLE MYOMECTOMY VS POLYPECTOMY WITH MYOSURE  . DILATION AND CURETTAGE OF UTERUS    . ELBOW SURGERY    . JOINT REPLACEMENT    . KNEE ARTHROSCOPY  bilateral x 2   . KNEE ARTHROSCOPY Left 11/03/2013   Procedure: LEFT KNEE ARTHROSCOPY WITH DEBRIDEMENT, PARTIAL SYNOVECTOMY;  Surgeon: Mcarthur Rossetti, MD;  [facility];  Service: Orthopedics;  Laterality: Left;  . LAPAROSCOPY    . left elbow surgery     . OTHER SURGICAL HISTORY     arthroswcopic surgery right knee   . OTHER SURGICAL HISTORY     arthroscopic surgery left knee x 2   . OTHER SURGICAL HISTORY     bunionectomy right foot   . OTHER SURGICAL HISTORY     C Section x 2   . OTHER SURGICAL HISTORY     carpal tunnel on left   . POSTERIOR CERVICAL FUSION/FORAMINOTOMY N/A 04/02/2020   Procedure: RIGHT C5-6 FORAMINOTOMY;  Surgeon: Jessy Oto, MD;  Location: [facility];  Service:  Orthopedics;  Laterality: N/A;  . RADIOLOGY WITH ANESTHESIA N/A 07/28/2019   Procedure: MRI WITH ANESTHESIA OF NECK SOFT TISSUE ONLY WITH AND WITHOUT CONTRAST;  Surgeon: Radiologist, Medication, MD;  Location: [facility];  Service: Radiology;  Laterality: N/A;  . RADIOLOGY WITH ANESTHESIA N/A 10/04/2019   Procedure: MRI WITH ANESTHESIA RIGHT SHOULDER WITHOUT CONTRAST,MRI OF CERVICAL SPINE WITHOUT CONTRAST;  Surgeon: Radiologist, Medication, MD;  Location: [facility];  Service: Radiology;  Laterality: N/A;  . right foot bunionectomy     . ROBOTIC ASSISTED TOTAL HYSTERECTOMY WITH BILATERAL SALPINGO OOPHERECTOMY Bilateral 06/16/2018   Procedure: XI ROBOTIC ASSISTED TOTAL HYSTERECTOMY WITH RIGHT SALPINGO OOPHORECTOMY;  Surgeon: Christophe Louis, MD;  [facility];  Service: Gynecology;  Laterality: Bilateral;  . TOTAL HIP ARTHROPLASTY  05/02/2011   Procedure: TOTAL HIP ARTHROPLASTY ANTERIOR APPROACH;  Surgeon: Mcarthur Rossetti, MD;  [facility];  Service: Orthopedics;  Laterality: Left;  Left Total Hip Arthroplasty, Direct Anterior Approach  . TOTAL KNEE ARTHROPLASTY Right 12/03/2012   Procedure: RIGHT TOTAL KNEE ARTHROPLASTY;  Surgeon: Mcarthur Rossetti, MD;  [facility];  Service: Orthopedics;  Laterality: Right;  . TOTAL KNEE ARTHROPLASTY Left 12/15/2014   Procedure: LEFT TOTAL KNEE ARTHROPLASTY;  Surgeon: Mcarthur Rossetti, MD;  [facility];  Service: Orthopedics;  Laterality: Left;  . TUBAL LIGATION    . ULNAR NERVE TRANSPOSITION     left   . UPPER GI ENDOSCOPY     x 2   Social History   Occupational History  . Not on file  Tobacco Use  . Smoking status: Current Every Day Smoker    Packs/day: 0.25    Years: 25.00    Pack years: 6.25    Types: Cigarettes  . Smokeless tobacco: Never Used  . Tobacco comment: Pt reports that she is in the process of quitting  Vaping Use  . Vaping Use: Never used  Substance and Sexual Activity  . Alcohol use: Yes     Comment: occasional beer   . Drug use: No    Comment: hx of 34 years ago marijuana   . Sexual activity: Not on file

## 2020-06-13 NOTE — Patient Instructions (Signed)
  Plan:Avoid overhead lifting and overhead use of the arms. Pillows to keep from sleeping directly on the shoulders Limited lifting to less than 10 lbs. Ice or heat for relief. NSAIDs are helpful, such as alleve or motrin, be careful not to use in excess as they place burdens on the kidney. Stretching exercise help and strengthening is helpful to build endurance. Repeat right UE EMG/NCV, she is post op right C6 foramenotomy, assess for any improvement in function.

## 2020-06-27 ENCOUNTER — Other Ambulatory Visit: Payer: Self-pay | Admitting: Radiology

## 2020-06-27 ENCOUNTER — Telehealth: Payer: Self-pay | Admitting: Physical Medicine and Rehabilitation

## 2020-06-27 DIAGNOSIS — M5412 Radiculopathy, cervical region: Secondary | ICD-10-CM

## 2020-06-27 NOTE — Telephone Encounter (Signed)
Patient saw Dr. Louanne Skye on 3/9. I do not see a new referral for this in the chart. Please advise.

## 2020-06-27 NOTE — Telephone Encounter (Signed)
It was noted the last OV note, I placed the order for it.

## 2020-06-27 NOTE — Telephone Encounter (Signed)
Patient called waiting for a return call about appt for nerve testing with Dr. Ernestina Patches. Please call patient at [phone].

## 2020-07-06 ENCOUNTER — Ambulatory Visit: Payer: Medicare Other | Admitting: Specialist

## 2020-08-08 ENCOUNTER — Other Ambulatory Visit: Payer: Self-pay

## 2020-08-08 ENCOUNTER — Encounter: Payer: Self-pay | Admitting: Physical Medicine and Rehabilitation

## 2020-08-08 ENCOUNTER — Ambulatory Visit (INDEPENDENT_AMBULATORY_CARE_PROVIDER_SITE_OTHER): Payer: Medicare Other | Admitting: Physical Medicine and Rehabilitation

## 2020-08-08 DIAGNOSIS — R202 Paresthesia of skin: Secondary | ICD-10-CM | POA: Diagnosis not present

## 2020-08-08 NOTE — Progress Notes (Signed)
Pt state neck, shoulder, arm and hand all on the right side. Pt state she has numbness in her hands and feels like a stick in her neck. Pt state she doesn't know what causes the pain. Pt state she take pain meds and lay down to help ease her pain. Pt haas hx of NCV on 12/23/19 and neck surgery on 04/03/20. Pt state she right handed.  Numeric Pain Rating Scale and Functional Assessment Average Pain 8   In the last MONTH (on 0-10 scale) has pain interfered with the following?  1. General activity like being  able to carry out your everyday physical activities such as walking, climbing stairs, carrying groceries, or moving a chair?  Rating(10)    -BT, -Dye Allergies.

## 2020-08-09 NOTE — Procedures (Signed)
EMG & NCV Findings: Evaluation of the right ulnar sensory nerve showed prolonged distal peak latency (5.3 ms), reduced amplitude (12.1 V), and decreased conduction velocity (Wrist-5th Digit, 26 m/s).  All remaining nerves (as indicated in the following tables) were within normal limits.    Needle evaluation of the right triceps muscle showed increased insertional activity and diminished recruitment.  The right biceps and the right pronator teres muscles showed diminished recruitment.  All remaining muscles (as indicated in the following table) showed no evidence of electrical instability.    Impression: The above electrodiagnostic study is ABNORMAL and reveals evidence of a mild chronic C6 radiculopathy on the right.  Subjectively this is mildly improved since the prior electrodiagnostic study.  There is still no active radiculopathy noted.  There is still significant improvement in the ulnar neuropathy from the first electrodiagnostic study that we did.  Technically she would still be considered mild ulnar neuropathy and that was consistent even with the last test.  She is really asymptomatic from an ulnar nerve standpoint.  There is no significant electrodiagnostic evidence of any other focal nerve entrapment (specifically no median nerve neuropathy )or brachial plexopathy  Recommendations: 1.  Follow-up with referring physician. 2.  Continue current management of symptoms.  ___________________________ Laurence Spates FAAPMR Board Certified, American Board of Physical Medicine and Rehabilitation    Nerve Conduction Studies Anti Sensory Summary Table   Stim Site NR Peak (ms) Norm Peak (ms) P-T Amp (V) Norm P-T Amp Site1 Site2 Delta-P (ms) Dist (cm) Vel (m/s) Norm Vel (m/s)  Right Median Acr Palm Anti Sensory (2nd Digit)  31.1C  Wrist    3.2 <3.6 18.1 >10 Wrist Palm 1.4 0.0    Palm    1.8 <2.0 25.3         Right Radial Anti Sensory (Base 1st Digit)  31.2C  Wrist    2.0 <3.1 11.0  Wrist  Base 1st Digit 2.0 0.0    Right Ulnar Anti Sensory (5th Digit)  31.4C  Wrist    *5.3 <3.7 *12.1 >15.0 Wrist 5th Digit 5.3 14.0 *26 >38   Motor Summary Table   Stim Site NR Onset (ms) Norm Onset (ms) O-P Amp (mV) Norm O-P Amp Site1 Site2 Delta-0 (ms) Dist (cm) Vel (m/s) Norm Vel (m/s)  Right Median Motor (Abd Poll Brev)  31.3C  Wrist    3.2 <4.2 9.0 >5 Elbow Wrist 4.2 22.5 54 >50  Elbow    7.4  8.6         Right Ulnar Motor (Abd Dig Min)  31.1C  Wrist    2.9 <4.2 6.4 >3 B Elbow Wrist 3.7 20.0 54 >53  B Elbow    6.6  5.4  A Elbow B Elbow 1.8 10.0 56 >53  A Elbow    8.4  3.4          EMG   Side Muscle Nerve Root Ins Act Fibs Psw Amp Dur Poly Recrt Int Fraser Din Comment  Right 1stDorInt Ulnar C8-T1 Nml Nml Nml Nml Nml 0 Nml Nml   Right Abd Poll Brev Median C8-T1 Nml Nml Nml Nml Nml 0 Nml Nml   Right Triceps Radial C6-7-8 *Incr Nml Nml Nml Nml 0 *Reduced Nml   Right Deltoid Axillary C5-6 Nml Nml Nml Nml Nml 0 Nml Nml   Right Biceps Musculocut C5-6 Nml Nml Nml Nml Nml 0 *Reduced Nml   Right PronatorTeres Median C6-7 Nml Nml Nml Nml Nml 0 *Reduced Nml   Right Ext Digitorum  Radial (Post Int) C7-8 Nml Nml Nml Nml Nml 0 Nml Nml     Nerve Conduction Studies Anti Sensory Left/Right Comparison   Stim Site L Lat (ms) R Lat (ms) L-R Lat (ms) L Amp (V) R Amp (V) L-R Amp (%) Site1 Site2 L Vel (m/s) R Vel (m/s) L-R Vel (m/s)  Median Acr Palm Anti Sensory (2nd Digit)  31.1C  Wrist  3.2   18.1  Wrist Palm     Palm  1.8   25.3        Radial Anti Sensory (Base 1st Digit)  31.2C  Wrist  2.0   11.0  Wrist Base 1st Digit     Ulnar Anti Sensory (5th Digit)  31.4C  Wrist  *5.3   *12.1  Wrist 5th Digit  *26    Motor Left/Right Comparison   Stim Site L Lat (ms) R Lat (ms) L-R Lat (ms) L Amp (mV) R Amp (mV) L-R Amp (%) Site1 Site2 L Vel (m/s) R Vel (m/s) L-R Vel (m/s)  Median Motor (Abd Poll Brev)  31.3C  Wrist  3.2   9.0  Elbow Wrist  54   Elbow  7.4   8.6        Ulnar Motor (Abd Dig Min)  31.1C   Wrist  2.9   6.4  B Elbow Wrist  54   B Elbow  6.6   5.4  A Elbow B Elbow  56   A Elbow  8.4   3.4           Waveforms:

## 2020-08-09 NOTE — Progress Notes (Signed)
Katrina [name] - 65 y.o. female MRN [id number]  Date of birth: [birth year]  Office Visit Note: Visit Date: 08/08/2020 PCP: Eunice Blase, MD Referred by: Eunice Blase, MD  Subjective: Chief Complaint  Patient presents with  . Neck - Pain  . Right Shoulder - Pain  . Right Arm - Pain  . Right Hand - Pain, Numbness   HPI:  Katrina [name] is a 65 y.o. female who comes in today For electrodiagnostic study at the request of Dr. Basil Dess.  This is a study of the Right upper limb.  Patient is right-hand dominant.  She has had 2 prior electrodiagnostic studies in 2021.  The first study showing a moderate right ulnar neuropathy but no radiculopathy.  She went on to have treatment for the ulnar neuropathy and the subsequent electrodiagnostic study in September 2021 showed almost complete resolution of the ulnar neuropathy particularly from a motor standpoint.  There was still some slowing of the sensory nerve.  However at that time the electrodiagnostic study revealed moderate chronic C6 radiculopathy on the Right.  She has since gone on to have right C5-6 foraminotomy in December 2021 by Dr. Louanne Skye..  She reports symptoms in the neck shoulder and arm on the right down to the thumb and first digit but in particular the thumb.  She gets a lot of numbness in the thumb and this has not gotten much better since the surgery.  Has difficulty with objects and does drop things at times.  Patient reports using pain medication and rest to alleviate any of the symptoms at all.  She rates her pain as an 8 out of 10.  ROS Otherwise per HPI.  Assessment & Plan: Visit Diagnoses:    ICD-10-CM   1. Paresthesia of skin  R20.2 NCV with EMG (electromyography)    Plan: Impression: The above electrodiagnostic study is ABNORMAL and reveals evidence of a mild chronic C6 radiculopathy on the right.  Subjectively this is mildly improved since the prior electrodiagnostic study.  There is still no active radiculopathy  noted.  There is still significant improvement in the ulnar neuropathy from the first electrodiagnostic study that we did.  Technically she would still be considered mild ulnar neuropathy and that was consistent even with the last test.  She is really asymptomatic from an ulnar nerve standpoint.  There is no significant electrodiagnostic evidence of any other focal nerve entrapment (specifically no median nerve neuropathy )or brachial plexopathy  Recommendations: 1.  Follow-up with referring physician. 2.  Continue current management of symptoms.  Meds & Orders: No orders of the defined types were placed in this encounter.   Orders Placed This Encounter  Procedures  . NCV with EMG (electromyography)    Follow-up: Return for Basil Dess, MD as scheduled.   Procedures: No procedures performed  EMG & NCV Findings: Evaluation of the right ulnar sensory nerve showed prolonged distal peak latency (5.3 ms), reduced amplitude (12.1 V), and decreased conduction velocity (Wrist-5th Digit, 26 m/s).  All remaining nerves (as indicated in the following tables) were within normal limits.    Needle evaluation of the right triceps muscle showed increased insertional activity and diminished recruitment.  The right biceps and the right pronator teres muscles showed diminished recruitment.  All remaining muscles (as indicated in the following table) showed no evidence of electrical instability.    Impression: The above electrodiagnostic study is ABNORMAL and reveals evidence of a mild chronic C6 radiculopathy on the right.  Subjectively this  is mildly improved since the prior electrodiagnostic study.  There is still no active radiculopathy noted.  There is still significant improvement in the ulnar neuropathy from the first electrodiagnostic study that we did.  Technically she would still be considered mild ulnar neuropathy and that was consistent even with the last test.  She is really asymptomatic from  an ulnar nerve standpoint.  There is no significant electrodiagnostic evidence of any other focal nerve entrapment (specifically no median nerve neuropathy )or brachial plexopathy  Recommendations: 1.  Follow-up with referring physician. 2.  Continue current management of symptoms.  ___________________________ Laurence Spates FAAPMR Board Certified, American Board of Physical Medicine and Rehabilitation    Nerve Conduction Studies Anti Sensory Summary Table   Stim Site NR Peak (ms) Norm Peak (ms) P-T Amp (V) Norm P-T Amp Site1 Site2 Delta-P (ms) Dist (cm) Vel (m/s) Norm Vel (m/s)  Right Median Acr Palm Anti Sensory (2nd Digit)  31.1C  Wrist    3.2 <3.6 18.1 >10 Wrist Palm 1.4 0.0    Palm    1.8 <2.0 25.3         Right Radial Anti Sensory (Base 1st Digit)  31.2C  Wrist    2.0 <3.1 11.0  Wrist Base 1st Digit 2.0 0.0    Right Ulnar Anti Sensory (5th Digit)  31.4C  Wrist    *5.3 <3.7 *12.1 >15.0 Wrist 5th Digit 5.3 14.0 *26 >38   Motor Summary Table   Stim Site NR Onset (ms) Norm Onset (ms) O-P Amp (mV) Norm O-P Amp Site1 Site2 Delta-0 (ms) Dist (cm) Vel (m/s) Norm Vel (m/s)  Right Median Motor (Abd Poll Brev)  31.3C  Wrist    3.2 <4.2 9.0 >5 Elbow Wrist 4.2 22.5 54 >50  Elbow    7.4  8.6         Right Ulnar Motor (Abd Dig Min)  31.1C  Wrist    2.9 <4.2 6.4 >3 B Elbow Wrist 3.7 20.0 54 >53  B Elbow    6.6  5.4  A Elbow B Elbow 1.8 10.0 56 >53  A Elbow    8.4  3.4          EMG   Side Muscle Nerve Root Ins Act Fibs Psw Amp Dur Poly Recrt Int Fraser Din Comment  Right 1stDorInt Ulnar C8-T1 Nml Nml Nml Nml Nml 0 Nml Nml   Right Abd Poll Brev Median C8-T1 Nml Nml Nml Nml Nml 0 Nml Nml   Right Triceps Radial C6-7-8 *Incr Nml Nml Nml Nml 0 *Reduced Nml   Right Deltoid Axillary C5-6 Nml Nml Nml Nml Nml 0 Nml Nml   Right Biceps Musculocut C5-6 Nml Nml Nml Nml Nml 0 *Reduced Nml   Right PronatorTeres Median C6-7 Nml Nml Nml Nml Nml 0 *Reduced Nml   Right Ext Digitorum  Radial (Post Int)  C7-8 Nml Nml Nml Nml Nml 0 Nml Nml     Nerve Conduction Studies Anti Sensory Left/Right Comparison   Stim Site L Lat (ms) R Lat (ms) L-R Lat (ms) L Amp (V) R Amp (V) L-R Amp (%) Site1 Site2 L Vel (m/s) R Vel (m/s) L-R Vel (m/s)  Median Acr Palm Anti Sensory (2nd Digit)  31.1C  Wrist  3.2   18.1  Wrist Palm     Palm  1.8   25.3        Radial Anti Sensory (Base 1st Digit)  31.2C  Wrist  2.0   11.0  Wrist Base 1st Digit  Ulnar Anti Sensory (5th Digit)  31.4C  Wrist  *5.3   *12.1  Wrist 5th Digit  *26    Motor Left/Right Comparison   Stim Site L Lat (ms) R Lat (ms) L-R Lat (ms) L Amp (mV) R Amp (mV) L-R Amp (%) Site1 Site2 L Vel (m/s) R Vel (m/s) L-R Vel (m/s)  Median Motor (Abd Poll Brev)  31.3C  Wrist  3.2   9.0  Elbow Wrist  54   Elbow  7.4   8.6        Ulnar Motor (Abd Dig Min)  31.1C  Wrist  2.9   6.4  B Elbow Wrist  54   B Elbow  6.6   5.4  A Elbow B Elbow  56   A Elbow  8.4   3.4           Waveforms:             Clinical History: Electrodiagnostic study December 23, 2019 Impression: The above electrodiagnostic study is ABNORMAL and reveals evidence of moderate chronic C6 radiculopathy on the right. There is no significant electrodiagnostic evidence of any other focal nerve entrapment, brachial plexopathy or generalized peripheral neuropathy.   *There is significant significant improvement of the ulnar nerve since the last electrodiagnostic study and ulnar nerve transposition. There is still some distal slowing at the wrist.  Recommendations: 1. Follow-up with referring physician. 2. Continue current management of symptoms.  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   There is also evidence of a very mild right median nerve  neuropathy at the wrist but this could also be due to technical artifact or temperature artifact.   There is no significant electrodiagnostic evidence of any other focal nerve entrapment, brachial plexopathy or cervical radiculopathy.   Recommendations: 1. Follow-up with referring physician. 2. Continue current management of symptoms. 3. Suggest surgical evaluation for the cubital tunnel. Suggest evaluation and possible MRI cervical spine do to soft tissue swelling in the neck.  ___________________________ Wonda Olds Board Certified, American Board of Physical Medicine and Rehabilitation     Objective:  VS:  HT:    WT:   BMI:     BP:   HR: bpm  TEMP: ( )  RESP:  Physical Exam Musculoskeletal:        General: No swelling, tenderness or deformity.     Comments: Inspection reveals no atrophy of the bilateral APB or FDI or hand intrinsics. There is no swelling, color changes, allodynia or dystrophic changes. There is 5 out of 5 strength in the bilateral wrist extension, finger abduction and long finger flexion.  There is decreased sensation to light touch and more of a C6 dermatome. There is a negative Tinel's test at the bilateral wrist and elbow. There is a negative Phalen's test bilaterally. There is a negative Hoffmann's test bilaterally.  Skin:    General: Skin is warm and dry.     Findings: No erythema or rash.  Neurological:     General: No focal deficit present.     Mental Status: She is alert and oriented to person, place, and time.     Motor: No weakness or abnormal muscle tone.     Coordination: Coordination normal.  Psychiatric:        Mood and Affect: Mood normal.  Behavior: Behavior normal.      Imaging: No results found.

## 2020-08-10 ENCOUNTER — Other Ambulatory Visit: Payer: Self-pay

## 2020-08-10 ENCOUNTER — Ambulatory Visit (INDEPENDENT_AMBULATORY_CARE_PROVIDER_SITE_OTHER): Payer: Medicare Other | Admitting: Specialist

## 2020-08-10 ENCOUNTER — Encounter: Payer: Self-pay | Admitting: Specialist

## 2020-08-10 VITALS — BP 157/99 | HR 92 | Ht 67.0 in | Wt 215.0 lb

## 2020-08-10 DIAGNOSIS — M542 Cervicalgia: Secondary | ICD-10-CM

## 2020-08-10 DIAGNOSIS — M961 Postlaminectomy syndrome, not elsewhere classified: Secondary | ICD-10-CM | POA: Diagnosis not present

## 2020-08-10 DIAGNOSIS — M5412 Radiculopathy, cervical region: Secondary | ICD-10-CM

## 2020-08-10 DIAGNOSIS — G5621 Lesion of ulnar nerve, right upper limb: Secondary | ICD-10-CM

## 2020-08-10 NOTE — Progress Notes (Signed)
Office Visit Note   Patient: Katrina [name]           Date of Birth: [birth year]           MRN: [id number] Visit Date: 08/10/2020              Requested by: Eunice Blase, MD [address] [place],  [place] [address] PCP: Eunice Blase, MD   Assessment & Plan: Visit Diagnoses:  1. Neck pain   2. Ulnar neuropathy of right upper extremity   3. Cervical post-laminectomy syndrome   4. Cervical radiculopathy     Plan: Avoid overhead lifting and overhead use of the arms. Do not lift greater than 5 lbs. Adjust head rest in vehicle to prevent hyperextension if rear ended. Take extra precautions to avoid falling. MRI of the cervical spine under a general anesthesia ordered.   Follow-Up Instructions: Return in about 3 weeks (around 08/31/2020).   Orders:  No orders of the defined types were placed in this encounter.  No orders of the defined types were placed in this encounter.     Procedures: No procedures performed   Clinical Data: No additional findings.   Subjective: Chief Complaint  Patient presents with  . Right Arm - Follow-up    Post EMG/NCS    65 year old right handed female with history of previous right elbow surgery with persistent right neck and right UE symptoms of numbness and paresthesias. She underwent EMG/NCV that show a chronic right C6 radiculopathy but also ulnar n changes.    Review of Systems  Constitutional: Negative.   HENT: Positive for congestion, postnasal drip, rhinorrhea, sinus pressure and sinus pain.   Eyes: Negative.   Respiratory: Positive for shortness of breath and wheezing. Negative for apnea, cough, choking and chest tightness.   Cardiovascular: Negative.   Gastrointestinal: Negative.   Endocrine: Negative.   Genitourinary: Negative.   Musculoskeletal: Positive for neck pain and neck stiffness. Negative for arthralgias, back pain, gait problem, joint swelling and myalgias.  Skin: Negative.   Allergic/Immunologic:  Negative.   Neurological: Positive for weakness and numbness.  Hematological: Negative.   Psychiatric/Behavioral: Negative.      Objective: Vital Signs: BP (!) 157/99 (BP Location: Left Arm, Patient Position: Sitting)   Pulse 92   Ht 5\' 7"  (1.702 m)   Wt 215 lb (97.5 kg)   BMI 33.67 kg/m   Physical Exam  Ortho Exam  Specialty Comments:  No specialty comments available.  Imaging: No results found.   PMFS History: Patient Active Problem List   Diagnosis Date Noted  . Other spondylosis with radiculopathy, cervical region 04/02/2020    Priority: High    Class: Chronic  . Pain in finger of left hand 02/24/2020  . Lung nodules 11/30/2019  . Neck pain 08/04/2019  . Acute pain of right shoulder 04/27/2019  . Neck mass 04/27/2019  . Postmenopausal bleeding 06/16/2018  . S/P hysterectomy 06/16/2018  . Chronic right shoulder pain 10/28/2016  . Impingement syndrome of right shoulder 10/28/2016  . Osteoarthritis of left knee 12/15/2014  . Status post total left knee replacement 12/15/2014  . Hand pain, left 04/21/2014  . Cat bite of finger 04/21/2014  . Asthma 04/21/2014  . Tobacco abuse 04/21/2014  . COPD (chronic obstructive pulmonary disease) ([place])   . Effusion of left knee joint 11/03/2013  . Arthritis of right knee 12/03/2012  . Degenerative arthritis of hip 05/02/2011   Past Medical History:  Diagnosis Date  . Anxiety   .  Arthritis    knees, hips, elbows   . Asthma    hx of asthma 3/12- hospitalized   . COPD (chronic obstructive pulmonary disease) ([place])   . Depression   . GERD (gastroesophageal reflux disease)    PRIOR TO HAVING GALLBLADDER REMOVED  . Headache   . Left bundle branch block 06/28/2010  . Lung infection 2015  . Lung infection   . volvulus     previous history of twisted intestines    Family History  Problem Relation Age of Onset  . Heart disease Mother     Past Surgical History:  Procedure Laterality Date  . BACK SURGERY     x2  .  CARPAL TUNNEL RELEASE     left   . CESAREAN SECTION     x 2  . CHOLECYSTECTOMY    . COLONOSCOPY  10/2017  . DILATATION & CURETTAGE/HYSTEROSCOPY WITH MYOSURE N/A 12/10/2017   Procedure: DILATATION & CURETTAGE/HYSTEROSCOPY WITH MYOSURE;  Surgeon: Christophe Louis, MD;  Location: [facility];  Service: Gynecology;  Laterality: N/A;  POSSIBLE MYOMECTOMY VS POLYPECTOMY WITH MYOSURE  . DILATION AND CURETTAGE OF UTERUS    . ELBOW SURGERY    . JOINT REPLACEMENT    . KNEE ARTHROSCOPY     bilateral x 2   . KNEE ARTHROSCOPY Left 11/03/2013   Procedure: LEFT KNEE ARTHROSCOPY WITH DEBRIDEMENT, PARTIAL SYNOVECTOMY;  Surgeon: Mcarthur Rossetti, MD;  Location: [facility];  Service: Orthopedics;  Laterality: Left;  . LAPAROSCOPY    . left elbow surgery     . OTHER SURGICAL HISTORY     arthroswcopic surgery right knee   . OTHER SURGICAL HISTORY     arthroscopic surgery left knee x 2   . OTHER SURGICAL HISTORY     bunionectomy right foot   . OTHER SURGICAL HISTORY     C Section x 2   . OTHER SURGICAL HISTORY     carpal tunnel on left   . POSTERIOR CERVICAL FUSION/FORAMINOTOMY N/A 04/02/2020   Procedure: RIGHT C5-6 FORAMINOTOMY;  Surgeon: Jessy Oto, MD;  Location: [facility];  Service: Orthopedics;  Laterality: N/A;  . RADIOLOGY WITH ANESTHESIA N/A 07/28/2019   Procedure: MRI WITH ANESTHESIA OF NECK SOFT TISSUE ONLY WITH AND WITHOUT CONTRAST;  Surgeon: Radiologist, Medication, MD;  Location: [facility];  Service: Radiology;  Laterality: N/A;  . RADIOLOGY WITH ANESTHESIA N/A 10/04/2019   Procedure: MRI WITH ANESTHESIA RIGHT SHOULDER WITHOUT CONTRAST,MRI OF CERVICAL SPINE WITHOUT CONTRAST;  Surgeon: Radiologist, Medication, MD;  Location: [facility];  Service: Radiology;  Laterality: N/A;  . right foot bunionectomy     . ROBOTIC ASSISTED TOTAL HYSTERECTOMY WITH BILATERAL SALPINGO OOPHERECTOMY Bilateral 06/16/2018   Procedure: XI ROBOTIC ASSISTED TOTAL HYSTERECTOMY WITH RIGHT SALPINGO OOPHORECTOMY;  Surgeon: Christophe Louis, MD;   Location: [facility];  Service: Gynecology;  Laterality: Bilateral;  . TOTAL HIP ARTHROPLASTY  05/02/2011   Procedure: TOTAL HIP ARTHROPLASTY ANTERIOR APPROACH;  Surgeon: Mcarthur Rossetti, MD;  Location: [facility];  Service: Orthopedics;  Laterality: Left;  Left Total Hip Arthroplasty, Direct Anterior Approach  . TOTAL KNEE ARTHROPLASTY Right 12/03/2012   Procedure: RIGHT TOTAL KNEE ARTHROPLASTY;  Surgeon: Mcarthur Rossetti, MD;  Location: [facility];  Service: Orthopedics;  Laterality: Right;  . TOTAL KNEE ARTHROPLASTY Left 12/15/2014   Procedure: LEFT TOTAL KNEE ARTHROPLASTY;  Surgeon: Mcarthur Rossetti, MD;  Location: [facility];  Service: Orthopedics;  Laterality: Left;  . TUBAL LIGATION    . ULNAR NERVE TRANSPOSITION  left   . UPPER GI ENDOSCOPY     x 2   Social History   Occupational History  . Not on file  Tobacco Use  . Smoking status: Current Every Day Smoker    Packs/day: 0.25    Years: 25.00    Pack years: 6.25    Types: Cigarettes  . Smokeless tobacco: Never Used  . Tobacco comment: Pt reports that she is in the process of quitting  Vaping Use  . Vaping Use: Never used  Substance and Sexual Activity  . Alcohol use: Yes    Comment: occasional beer   . Drug use: No    Comment: hx of 34 years ago marijuana   . Sexual activity: Not on file

## 2020-08-10 NOTE — Patient Instructions (Signed)
Avoid overhead lifting and overhead use of the arms. Do not lift greater than 5 lbs. Adjust head rest in vehicle to prevent hyperextension if rear ended. Take extra precautions to avoid falling. MRI of the cervical spine under a general anesthesia ordered.

## 2020-08-27 ENCOUNTER — Other Ambulatory Visit (HOSPITAL_COMMUNITY)
Admission: RE | Admit: 2020-08-27 | Discharge: 2020-08-27 | Disposition: A | Discharge status: Home / Self Care | Payer: Medicare Other | Source: Ambulatory Visit | Attending: Specialist | Admitting: Specialist

## 2020-08-27 DIAGNOSIS — Z01812 Encounter for preprocedural laboratory examination: Secondary | ICD-10-CM | POA: Insufficient documentation

## 2020-08-27 DIAGNOSIS — Z20822 Contact with and (suspected) exposure to covid-19: Secondary | ICD-10-CM | POA: Diagnosis not present

## 2020-08-27 LAB — SARS CORONAVIRUS 2 (TAT 6-24 HRS): SARS Coronavirus 2: NEGATIVE

## 2020-08-28 ENCOUNTER — Encounter (HOSPITAL_COMMUNITY): Payer: Self-pay | Admitting: *Deleted

## 2020-08-28 NOTE — Progress Notes (Signed)
Spoke with pt for pre-op call. Pt denies cardiac history, HTN or Diabetes.   Covid test done 08/27/20 and it's negative. Pt understands that she needs to wear mask if she goes out or around other people that she normally does not live with.

## 2020-08-29 NOTE — Anesthesia Preprocedure Evaluation (Addendum)
Anesthesia Evaluation  Patient identified by MRN, date of birth, ID band Patient awake    Reviewed: Allergy & Precautions, NPO status , Patient's Chart, lab work & pertinent test results  History of Anesthesia Complications Negative for: history of anesthetic complications  Airway Mallampati: III  TM Distance: >3 FB Neck ROM: Full    Dental  (+) Missing,    Pulmonary asthma , COPD,  COPD inhaler, Current Smoker,    Pulmonary exam normal        Cardiovascular negative cardio ROS Normal cardiovascular exam     Neuro/Psych  Headaches, Anxiety Depression Cervical myelopathy    GI/Hepatic Neg liver ROS, GERD  Controlled,  Endo/Other  negative endocrine ROS  Renal/GU negative Renal ROS  negative genitourinary   Musculoskeletal  (+) Arthritis ,   Abdominal   Peds  Hematology negative hematology ROS (+)   Anesthesia Other Findings Day of surgery medications reviewed with patient.  Reproductive/Obstetrics negative OB ROS                            Anesthesia Physical Anesthesia Plan  ASA: II  Anesthesia Plan: General   Post-op Pain Management:    Induction: Intravenous  PONV Risk Score and Plan: 3 and Treatment may vary due to age or medical condition, Ondansetron, Dexamethasone and Midazolam  Airway Management Planned: Oral ETT  Additional Equipment: None  Intra-op Plan:   Post-operative Plan: Extubation in OR  Informed Consent: I have reviewed the patients History and Physical, chart, labs and discussed the procedure including the risks, benefits and alternatives for the proposed anesthesia with the patient or authorized representative who has indicated his/her understanding and acceptance.     Dental advisory given  Plan Discussed with: CRNA  Anesthesia Plan Comments:       Anesthesia Quick Evaluation

## 2020-08-30 ENCOUNTER — Encounter (HOSPITAL_COMMUNITY): Admission: RE | Disposition: A | Discharge status: Home / Self Care | Payer: Self-pay | Source: Home / Self Care

## 2020-08-30 ENCOUNTER — Other Ambulatory Visit: Payer: Self-pay

## 2020-08-30 ENCOUNTER — Ambulatory Visit (HOSPITAL_COMMUNITY)
Admission: RE | Admit: 2020-08-30 | Discharge: 2020-08-30 | Disposition: A | Discharge status: Home / Self Care | Payer: Medicare Other | Attending: Specialist | Admitting: Specialist

## 2020-08-30 ENCOUNTER — Ambulatory Visit (HOSPITAL_COMMUNITY): Payer: Medicare Other | Admitting: Anesthesiology

## 2020-08-30 ENCOUNTER — Encounter (HOSPITAL_COMMUNITY): Payer: Self-pay

## 2020-08-30 ENCOUNTER — Ambulatory Visit (HOSPITAL_COMMUNITY)
Admission: RE | Admit: 2020-08-30 | Discharge: 2020-08-30 | Disposition: A | Discharge status: Home / Self Care | Payer: Medicare Other | Source: Ambulatory Visit | Attending: Specialist | Admitting: Specialist

## 2020-08-30 DIAGNOSIS — M542 Cervicalgia: Secondary | ICD-10-CM

## 2020-08-30 DIAGNOSIS — F418 Other specified anxiety disorders: Secondary | ICD-10-CM | POA: Diagnosis not present

## 2020-08-30 DIAGNOSIS — M4722 Other spondylosis with radiculopathy, cervical region: Secondary | ICD-10-CM | POA: Diagnosis not present

## 2020-08-30 DIAGNOSIS — J449 Chronic obstructive pulmonary disease, unspecified: Secondary | ICD-10-CM | POA: Diagnosis not present

## 2020-08-30 DIAGNOSIS — M961 Postlaminectomy syndrome, not elsewhere classified: Secondary | ICD-10-CM

## 2020-08-30 DIAGNOSIS — K219 Gastro-esophageal reflux disease without esophagitis: Secondary | ICD-10-CM | POA: Diagnosis not present

## 2020-08-30 DIAGNOSIS — M5412 Radiculopathy, cervical region: Secondary | ICD-10-CM

## 2020-08-30 HISTORY — PX: RADIOLOGY WITH ANESTHESIA: SHX6223

## 2020-08-30 SURGERY — MRI WITH ANESTHESIA
Anesthesia: General

## 2020-08-30 MED ORDER — ONDANSETRON HCL 4 MG/2ML IJ SOLN
INTRAMUSCULAR | Status: DC | PRN
  Administered 2020-08-30: 4 mg via INTRAVENOUS

## 2020-08-30 MED ORDER — IPRATROPIUM-ALBUTEROL 0.5-2.5 (3) MG/3ML IN SOLN: 3.0000 mL | RESPIRATORY_TRACT | Status: DC

## 2020-08-30 MED ORDER — IPRATROPIUM-ALBUTEROL 0.5-2.5 (3) MG/3ML IN SOLN
RESPIRATORY_TRACT | Status: AC
  Administered 2020-08-30: 3 mL
  Filled 2020-08-30: qty 3

## 2020-08-30 MED ORDER — PHENYLEPHRINE HCL-NACL 10-0.9 MG/250ML-% IV SOLN
INTRAVENOUS | Status: DC | PRN
  Administered 2020-08-30: 25 ug/min via INTRAVENOUS

## 2020-08-30 MED ORDER — SUCCINYLCHOLINE CHLORIDE 200 MG/10ML IV SOSY
PREFILLED_SYRINGE | INTRAVENOUS | Status: DC | PRN
  Administered 2020-08-30: 100 mg via INTRAVENOUS

## 2020-08-30 MED ORDER — CHLORHEXIDINE GLUCONATE 0.12 % MT SOLN
15.0000 mL | Freq: Once | OROMUCOSAL | Status: AC
  Administered 2020-08-30: 15 mL via OROMUCOSAL
  Filled 2020-08-30: qty 15

## 2020-08-30 MED ORDER — MIDAZOLAM HCL 2 MG/2ML IJ SOLN
INTRAMUSCULAR | Status: DC | PRN
  Administered 2020-08-30: 2 mg via INTRAVENOUS

## 2020-08-30 MED ORDER — DEXAMETHASONE SODIUM PHOSPHATE 10 MG/ML IJ SOLN
INTRAMUSCULAR | Status: DC | PRN
  Administered 2020-08-30: 4 mg via INTRAVENOUS

## 2020-08-30 MED ORDER — SUGAMMADEX SODIUM 200 MG/2ML IV SOLN
INTRAVENOUS | Status: DC | PRN
  Administered 2020-08-30: 200 mg via INTRAVENOUS

## 2020-08-30 MED ORDER — LACTATED RINGERS IV SOLN: INTRAVENOUS | Status: DC

## 2020-08-30 MED ORDER — FENTANYL CITRATE (PF) 100 MCG/2ML IJ SOLN
INTRAMUSCULAR | Status: DC | PRN
  Administered 2020-08-30 (×2): 50 ug via INTRAVENOUS

## 2020-08-30 MED ORDER — ACETAMINOPHEN 325 MG PO TABS
ORAL_TABLET | ORAL | Status: AC
  Filled 2020-08-30: qty 2

## 2020-08-30 MED ORDER — PROPOFOL 10 MG/ML IV BOLUS
INTRAVENOUS | Status: DC | PRN
  Administered 2020-08-30: 180 mg via INTRAVENOUS

## 2020-08-30 MED ORDER — ALBUTEROL SULFATE HFA 108 (90 BASE) MCG/ACT IN AERS
INHALATION_SPRAY | RESPIRATORY_TRACT | Status: DC | PRN
  Administered 2020-08-30: 12 via RESPIRATORY_TRACT

## 2020-08-30 MED ORDER — LIDOCAINE 2% (20 MG/ML) 5 ML SYRINGE
INTRAMUSCULAR | Status: DC | PRN
  Administered 2020-08-30: 80 mg via INTRAVENOUS

## 2020-08-30 MED ORDER — ROCURONIUM BROMIDE 10 MG/ML (PF) SYRINGE
PREFILLED_SYRINGE | INTRAVENOUS | Status: DC | PRN
  Administered 2020-08-30: 40 mg via INTRAVENOUS

## 2020-08-30 MED ORDER — ACETAMINOPHEN 325 MG PO TABS: 650.0000 mg | ORAL_TABLET | Freq: Once | ORAL | Status: DC

## 2020-08-30 MED ORDER — ORAL CARE MOUTH RINSE: 15.0000 mL | Freq: Once | OROMUCOSAL | Status: AC

## 2020-08-30 NOTE — Anesthesia Procedure Notes (Signed)
Procedure Name: Intubation Date/Time: 08/30/2020 8:34 AM Performed by: Renato Shin, CRNA Pre-anesthesia Checklist: Patient identified, Emergency Drugs available, Suction available and Patient being monitored Patient Re-evaluated:Patient Re-evaluated prior to induction Oxygen Delivery Method: Circle system utilized Preoxygenation: Pre-oxygenation with 100% oxygen Induction Type: IV induction Ventilation: Mask ventilation without difficulty Laryngoscope Size: Miller and 2 Grade View: Grade I Tube type: Oral Tube size: 7.0 mm Number of attempts: 1 Airway Equipment and Method: Stylet and Oral airway Placement Confirmation: ETT inserted through vocal cords under direct vision,  positive ETCO2 and breath sounds checked- equal and bilateral Secured at: 21 cm Tube secured with: Tape Dental Injury: Teeth and Oropharynx as per pre-operative assessment

## 2020-08-30 NOTE — Progress Notes (Signed)
No Labs prior to MRI per Dr. Daiva Huge.

## 2020-08-30 NOTE — Anesthesia Postprocedure Evaluation (Signed)
Anesthesia Post Note  Patient: Katrina [name]  Procedure(s) Performed: MRI WITH ANESTHESIA CERVICAL SPINE WITHOUT CONTRAST (N/A )     Patient location during evaluation: PACU Anesthesia Type: General Level of consciousness: awake and alert and oriented Pain management: pain level controlled Vital Signs Assessment: post-procedure vital signs reviewed and stable Respiratory status: spontaneous breathing, nonlabored ventilation and respiratory function stable Cardiovascular status: blood pressure returned to baseline Postop Assessment: no apparent nausea or vomiting Anesthetic complications: no   No complications documented.  Last Vitals:  Vitals:   08/30/20 0958 08/30/20 1003  BP: (!) 163/88 (!) 153/79  Pulse: 100 96  Resp: 20 16  Temp:    SpO2: 100% 93%        Brennan Bailey

## 2020-08-30 NOTE — Progress Notes (Signed)
Feels much better she says, despite headache. Laughs and jokes with staff, maintains o2 sat  At 92% room air

## 2020-08-30 NOTE — Transfer of Care (Signed)
Immediate Anesthesia Transfer of Care Note  Patient: Katrina [name]  Procedure(s) Performed: MRI WITH ANESTHESIA CERVICAL SPINE WITHOUT CONTRAST (N/A )  Patient Location: PACU  Anesthesia Type:General  Level of Consciousness: awake and patient cooperative  Airway & Oxygen Therapy: Patient Spontanous Breathing and Patient connected to nasal cannula oxygen  Post-op Assessment: Report given to RN and Post -op Vital signs reviewed and stable  Post vital signs: Reviewed and stable  Last Vitals:  Vitals Value Taken Time  BP 163/83 08/30/20 0919  Temp    Pulse 104 08/30/20 0923  Resp 13 08/30/20 0923  SpO2 91 % 08/30/20 0923  Vitals shown include unvalidated device data.  Last Pain:  Vitals:   08/30/20 0708  PainSc: 10-Worst pain ever      Patients Stated Pain Goal: 4 (95/97/47 1855)  Complications: No complications documented.

## 2020-08-31 ENCOUNTER — Encounter (HOSPITAL_COMMUNITY): Payer: Self-pay | Admitting: Radiology

## 2020-09-10 ENCOUNTER — Other Ambulatory Visit: Payer: Self-pay

## 2020-09-10 ENCOUNTER — Encounter: Payer: Self-pay | Admitting: Specialist

## 2020-09-10 ENCOUNTER — Ambulatory Visit: Payer: Medicare Other | Admitting: Specialist

## 2020-09-10 VITALS — BP 143/85 | HR 82 | Ht 67.0 in | Wt 215.0 lb

## 2020-09-10 DIAGNOSIS — M961 Postlaminectomy syndrome, not elsewhere classified: Secondary | ICD-10-CM

## 2020-09-10 DIAGNOSIS — M542 Cervicalgia: Secondary | ICD-10-CM

## 2020-09-10 DIAGNOSIS — G5621 Lesion of ulnar nerve, right upper limb: Secondary | ICD-10-CM | POA: Diagnosis not present

## 2020-09-10 MED ORDER — AMITRIPTYLINE HCL 10 MG PO TABS: 10.0000 mg | ORAL_TABLET | Freq: Every evening | ORAL | 3 refills | Status: DC | PRN

## 2020-09-10 NOTE — Patient Instructions (Signed)
Avoid overhead lifting and overhead use of the arms. Do not lift greater than 5 lbs. Adjust head rest in vehicle to prevent hyperextension if rear ended. Take extra precautions to avoid falling. Occupational therapy for right hand rehabilitation following both right arm surgery and right neck surgery with persistent numbness and dysfunction.  Elavil 10 mg for use at night for discomfort in the right hand.

## 2020-09-10 NOTE — Progress Notes (Signed)
Office Visit Note   Patient: Katrina [name]           Date of Birth: 06/05/1955           MRN: [id number] Visit Date: 09/10/2020              Requested by: Eunice Blase, MD [address] [place],  [place] [address] PCP: Eunice Blase, MD   Assessment & Plan: Visit Diagnoses:  1. Ulnar neuropathy of right upper extremity   2. Neck pain   3. Cervical post-laminectomy syndrome     Plan: Avoid overhead lifting and overhead use of the arms. Do not lift greater than 5 lbs. Adjust head rest in vehicle to prevent hyperextension if rear ended. Take extra precautions to avoid falling. Occupational therapy for right hand rehabilitation following both right arm surgery and right neck surgery with persistent numbness and dysfunction.  Elavil 10 mg for use at night for discomfort in the right hand.  Follow-Up Instructions: Return in about 4 weeks (around 10/08/2020).   Orders:  Orders Placed This Encounter  Procedures  . Ambulatory referral to Occupational Therapy   Meds ordered this encounter  Medications  . amitriptyline (ELAVIL) 10 MG tablet    Sig: Take 1 tablet (10 mg total) by mouth at bedtime as needed for sleep.    Dispense:  30 tablet    Refill:  3      Procedures: No procedures performed   Clinical Data: No additional findings.   Subjective: Chief Complaint  Patient presents with  . Neck - Follow-up    MRI Review of CSP    65 year old female with history of right UE numbness and tingling and weakness has had release of the right ulnar n at the elbow and right C5-6 foraminotomy. She is nearly 651/2 months post op and complains of numbness right hand that is persisting and even worse than preop. EMG/NCV show right C6 radiculopathy. MRI with improved appearance right C6 foramen with mild narrowing.     Review of Systems  Constitutional: Negative.   HENT: Negative.   Eyes: Negative.   Respiratory: Negative.   Cardiovascular: Negative.    Gastrointestinal: Negative.   Endocrine: Negative.   Genitourinary: Negative.   Musculoskeletal: Negative.   Skin: Negative.   Allergic/Immunologic: Negative.   Neurological: Negative.   Hematological: Negative.   Psychiatric/Behavioral: Negative.      Objective: Vital Signs: BP (!) 143/85 (BP Location: Left Arm, Patient Position: Sitting)   Pulse 82   Ht 5\' 7"  (1.702 m)   Wt 215 lb (97.5 kg)   BMI 33.67 kg/m   Physical Exam Constitutional:      Appearance: She is well-developed.  HENT:     Head: Normocephalic and atraumatic.  Eyes:     Pupils: Pupils are equal, round, and reactive to light.  Pulmonary:     Effort: Pulmonary effort is normal.     Breath sounds: Normal breath sounds.  Abdominal:     General: Bowel sounds are normal.     Palpations: Abdomen is soft.  Musculoskeletal:        General: Normal range of motion.     Cervical back: Normal range of motion and neck supple.  Skin:    General: Skin is warm and dry.  Neurological:     Mental Status: She is alert and oriented to person, place, and time.  Psychiatric:        Behavior: Behavior normal.  Thought Content: Thought content normal.        Judgment: Judgment normal.     Back Exam   Tenderness  The patient is experiencing tenderness in the cervical.      Specialty Comments:  No specialty comments available.  Imaging: No results found.   PMFS History: Patient Active Problem List   Diagnosis Date Noted  . Other spondylosis with radiculopathy, cervical region 04/02/2020    Priority: High    Class: Chronic  . Pain in finger of left hand 02/24/2020  . Lung nodules 11/30/2019  . Neck pain 08/04/2019  . Acute pain of right shoulder 04/27/2019  . Neck mass 04/27/2019  . Postmenopausal bleeding 06/16/2018  . S/P hysterectomy 06/16/2018  . Chronic right shoulder pain 10/28/2016  . Impingement syndrome of right shoulder 10/28/2016  . Osteoarthritis of left knee 12/15/2014  . Status  post total left knee replacement 12/15/2014  . Hand pain, left 04/21/2014  . Cat bite of finger 04/21/2014  . Asthma 04/21/2014  . Tobacco abuse 04/21/2014  . COPD (chronic obstructive pulmonary disease) ([place])   . Effusion of left knee joint 11/03/2013  . Arthritis of right knee 12/03/2012  . Degenerative arthritis of hip 05/02/2011   Past Medical History:  Diagnosis Date  . Anxiety   . Arthritis    knees, hips, elbows   . Asthma    hx of asthma 3/12- hospitalized   . COPD (chronic obstructive pulmonary disease) ([facility] [facility])   . Depression   . GERD (gastroesophageal reflux disease)    PRIOR TO HAVING GALLBLADDER REMOVED  . Headache    from her neck  . Left bundle branch block 06/28/2010  . Lung infection 2015  . Lung infection     Family History  Problem Relation Age of Onset  . Heart disease Mother     Past Surgical History:  Procedure Laterality Date  . BACK SURGERY     x2  . CARPAL TUNNEL RELEASE     left   . CESAREAN SECTION     x 2  . CHOLECYSTECTOMY    . COLONOSCOPY  10/2017  . DILATATION & CURETTAGE/HYSTEROSCOPY WITH MYOSURE N/A 12/10/2017   Procedure: DILATATION & CURETTAGE/HYSTEROSCOPY WITH MYOSURE;  Surgeon: Christophe Louis, MD;  Location: [facility];  Service: Gynecology;  Laterality: N/A;  POSSIBLE MYOMECTOMY VS POLYPECTOMY WITH MYOSURE  . DILATION AND CURETTAGE OF UTERUS    . ELBOW SURGERY    . JOINT REPLACEMENT    . KNEE ARTHROSCOPY     bilateral x 2   . KNEE ARTHROSCOPY Left 11/03/2013   Procedure: LEFT KNEE ARTHROSCOPY WITH DEBRIDEMENT, PARTIAL SYNOVECTOMY;  Surgeon: Mcarthur Rossetti, MD;  Location: [facility];  Service: Orthopedics;  Laterality: Left;  . LAPAROSCOPY    . left elbow surgery     . OTHER SURGICAL HISTORY     arthroswcopic surgery right knee   . OTHER SURGICAL HISTORY     arthroscopic surgery left knee x 2   . OTHER SURGICAL HISTORY     bunionectomy right foot   . OTHER SURGICAL HISTORY     C Section x 2   . OTHER SURGICAL HISTORY     carpal  tunnel on left   . POSTERIOR CERVICAL FUSION/FORAMINOTOMY N/A 04/02/2020   Procedure: RIGHT C5-6 FORAMINOTOMY;  Surgeon: Jessy Oto, MD;  Location: [facility];  Service: Orthopedics;  Laterality: N/A;  . RADIOLOGY WITH ANESTHESIA N/A 07/28/2019   Procedure: MRI WITH ANESTHESIA OF NECK SOFT TISSUE ONLY WITH  AND WITHOUT CONTRAST;  Surgeon: Radiologist, Medication, MD;  Location: [facility];  Service: Radiology;  Laterality: N/A;  . RADIOLOGY WITH ANESTHESIA N/A 10/04/2019   Procedure: MRI WITH ANESTHESIA RIGHT SHOULDER WITHOUT CONTRAST,MRI OF CERVICAL SPINE WITHOUT CONTRAST;  Surgeon: Radiologist, Medication, MD;  Location: [facility];  Service: Radiology;  Laterality: N/A;  . RADIOLOGY WITH ANESTHESIA N/A 08/30/2020   Procedure: MRI WITH ANESTHESIA CERVICAL SPINE WITHOUT CONTRAST;  Surgeon: Radiologist, Medication, MD;  Location: [facility];  Service: Radiology;  Laterality: N/A;  . right foot bunionectomy     . ROBOTIC ASSISTED TOTAL HYSTERECTOMY WITH BILATERAL SALPINGO OOPHERECTOMY Bilateral 06/16/2018   Procedure: XI ROBOTIC ASSISTED TOTAL HYSTERECTOMY WITH RIGHT SALPINGO OOPHORECTOMY;  Surgeon: Christophe Louis, MD;  Location: [facility];  Service: Gynecology;  Laterality: Bilateral;  . TOTAL HIP ARTHROPLASTY  05/02/2011   Procedure: TOTAL HIP ARTHROPLASTY ANTERIOR APPROACH;  Surgeon: Mcarthur Rossetti, MD;  Location: [facility];  Service: Orthopedics;  Laterality: Left;  Left Total Hip Arthroplasty, Direct Anterior Approach  . TOTAL KNEE ARTHROPLASTY Right 12/03/2012   Procedure: RIGHT TOTAL KNEE ARTHROPLASTY;  Surgeon: Mcarthur Rossetti, MD;  Location: [facility];  Service: Orthopedics;  Laterality: Right;  . TOTAL KNEE ARTHROPLASTY Left 12/15/2014   Procedure: LEFT TOTAL KNEE ARTHROPLASTY;  Surgeon: Mcarthur Rossetti, MD;  Location: [facility];  Service: Orthopedics;  Laterality: Left;  . TUBAL LIGATION    . ULNAR NERVE TRANSPOSITION     left   . UPPER GI ENDOSCOPY     x 2   Social History    Occupational History  . Not on file  Tobacco Use  . Smoking status: Current Every Day Smoker    Packs/day: 1.00    Years: 25.00    Pack years: 25.00    Types: Cigarettes  . Smokeless tobacco: Never Used  Vaping Use  . Vaping Use: Never used  Substance and Sexual Activity  . Alcohol use: Yes    Alcohol/week: 49.0 standard drinks    Types: 49 Cans of beer per week  . Drug use: No    Comment: hx of 34 years ago marijuana   . Sexual activity: Not on file

## 2020-09-26 ENCOUNTER — Ambulatory Visit: Payer: Medicare Other | Attending: Specialist | Admitting: Occupational Therapy

## 2020-09-26 ENCOUNTER — Other Ambulatory Visit: Payer: Self-pay

## 2020-09-26 DIAGNOSIS — M6281 Muscle weakness (generalized): Secondary | ICD-10-CM | POA: Diagnosis not present

## 2020-09-26 DIAGNOSIS — R278 Other lack of coordination: Secondary | ICD-10-CM | POA: Insufficient documentation

## 2020-09-26 DIAGNOSIS — M79601 Pain in right arm: Secondary | ICD-10-CM | POA: Diagnosis not present

## 2020-09-26 DIAGNOSIS — R208 Other disturbances of skin sensation: Secondary | ICD-10-CM | POA: Diagnosis not present

## 2020-09-27 NOTE — Therapy (Signed)
[facility] [address] [place], [place], [address] Phone: [phone]   Fax:  [phone]  Occupational Therapy Evaluation  Patient Details  Name: Katrina [name] MRN: [id number] Date of Birth: December 22, 1955 Referring Provider (OT): Dr. Louanne Skye   Encounter Date: 09/26/2020   OT End of Session - 09/27/20 0812     Visit Number 1    Number of Visits 16    Date for OT Re-Evaluation 11/24/20   Authorization Type BCBS Medicare    Authorization - Visit Number 1    Authorization - Number of Visits 10    Progress Note Due on Visit 10    OT Start Time 0804    OT Stop Time 8527    OT Time Calculation (min) 40 min    Activity Tolerance Patient tolerated treatment well    Behavior During Therapy [facility] for tasks assessed/performed             Past Medical History:  Diagnosis Date   Anxiety    Arthritis    knees, hips, elbows    Asthma    hx of asthma 3/12- hospitalized    COPD (chronic obstructive pulmonary disease) ([place] [facility])    Depression    GERD (gastroesophageal reflux disease)    PRIOR TO HAVING GALLBLADDER REMOVED   Headache    from her neck   Left bundle branch block 06/28/2010   Lung infection 2015   Lung infection     Past Surgical History:  Procedure Laterality Date   BACK SURGERY     x2   CARPAL TUNNEL RELEASE     left    CESAREAN SECTION     x 2   CHOLECYSTECTOMY     COLONOSCOPY  10/2017   DILATATION & CURETTAGE/HYSTEROSCOPY WITH MYOSURE N/A 12/10/2017   Procedure: DILATATION & CURETTAGE/HYSTEROSCOPY WITH MYOSURE;  Surgeon: Christophe Louis, MD;  Location: [facility];  Service: Gynecology;  Laterality: N/A;  POSSIBLE MYOMECTOMY VS POLYPECTOMY WITH MYOSURE   DILATION AND CURETTAGE OF UTERUS     ELBOW SURGERY     JOINT REPLACEMENT     KNEE ARTHROSCOPY     bilateral x 2    KNEE ARTHROSCOPY Left 11/03/2013   Procedure: LEFT KNEE ARTHROSCOPY WITH DEBRIDEMENT, PARTIAL SYNOVECTOMY;  Surgeon: Mcarthur Rossetti,  MD;  Location[facility];  Service: Orthopedics;  Laterality: Left;   LAPAROSCOPY     left elbow surgery      OTHER SURGICAL HISTORY     arthroswcopic surgery right knee    OTHER SURGICAL HISTORY     arthroscopic surgery left knee x 2    OTHER SURGICAL HISTORY     bunionectomy right foot    OTHER SURGICAL HISTORY     C Section x 2    OTHER SURGICAL HISTORY     carpal tunnel on left    POSTERIOR CERVICAL FUSION/FORAMINOTOMY N/A 04/02/2020   Procedure: RIGHT C5-6 FORAMINOTOMY;  Surgeon: Jessy Oto, MD;  Location: [facility];  Service: Orthopedics;  Laterality: N/A;   RADIOLOGY WITH ANESTHESIA N/A 07/28/2019   Procedure: MRI WITH ANESTHESIA OF NECK SOFT TISSUE ONLY WITH AND WITHOUT CONTRAST;  Surgeon: Radiologist, Medication, MD;  Location: [facility];  Service: Radiology;  Laterality: N/A;   RADIOLOGY WITH ANESTHESIA N/A 10/04/2019   Procedure: MRI WITH ANESTHESIA RIGHT SHOULDER WITHOUT CONTRAST,MRI OF CERVICAL SPINE WITHOUT CONTRAST;  Surgeon: Radiologist, Medication, MD;  Location: [facility];  Service: Radiology;  Laterality: N/A;   RADIOLOGY WITH ANESTHESIA N/A 08/30/2020  Procedure: MRI WITH ANESTHESIA CERVICAL SPINE WITHOUT CONTRAST;  Surgeon: Radiologist, Medication, MD;  Location: [facility];  Service: Radiology;  Laterality: N/A;   right foot bunionectomy      ROBOTIC ASSISTED TOTAL HYSTERECTOMY WITH BILATERAL SALPINGO OOPHERECTOMY Bilateral 06/16/2018   Procedure: XI ROBOTIC ASSISTED TOTAL HYSTERECTOMY WITH RIGHT SALPINGO OOPHORECTOMY;  Surgeon: Christophe Louis, MD;  Location: [facility];  Service: Gynecology;  Laterality: Bilateral;   TOTAL HIP ARTHROPLASTY  05/02/2011   Procedure: TOTAL HIP ARTHROPLASTY ANTERIOR APPROACH;  Surgeon: Mcarthur Rossetti, MD;  Location[facility];  Service: Orthopedics;  Laterality: Left;  Left Total Hip Arthroplasty, Direct Anterior Approach   TOTAL KNEE ARTHROPLASTY Right 12/03/2012   Procedure: RIGHT TOTAL KNEE ARTHROPLASTY;  Surgeon: Mcarthur Rossetti,  MD;  Location[facility];  Service: Orthopedics;  Laterality: Right;   TOTAL KNEE ARTHROPLASTY Left 12/15/2014   Procedure: LEFT TOTAL KNEE ARTHROPLASTY;  Surgeon: Mcarthur Rossetti, MD;  [facility];  Service: Orthopedics;  Laterality: Left;   TUBAL LIGATION     ULNAR NERVE TRANSPOSITION     left    UPPER GI ENDOSCOPY     x 2    There were no vitals filed for this visit.   Subjective Assessment - 09/27/20 0811     Subjective  Pt reports not being able to use her right arm arnd feelings of tightness    Pertinent History 65 year old female with history of right UE numbness and tingling and weakness has had release of the right ulnar n at the elbow in 2021 and right C5-6 foraminotomy/ cervical fusion 04/02/20.She reports  of numbness right hand that is persisting and even worse than preop. EMG/NCV show right C6 radiculopathy.MRI with improved appearance of R C6 foramen with mild narrowing. PMH anxiety, arthritis, COPD, asthma, depression left bundle branch block, hx of ulnar n. relaese L elbow    Limitations no lifting greater than 5 lbs, no overhead lifting(limit shoulder ROM to 90 on right until clarified by MD)    Patient Stated Goals Pt reports she wants to get her arm back    Currently in Pain? Yes    Pain Score 3     Pain Location Arm    Pain Orientation Right    Pain Descriptors / Indicators Aching;Numbness;Tightness    Pain Type Chronic pain    Pain Onset More than a month ago    Pain Frequency Intermittent    Aggravating Factors  certain positions    Pain Relieving Factors repositioning               [facility] OT Assessment - 09/27/20 0001       Assessment   Medical Diagnosis ulnar neuropathy/ C6 radiculopathy, s/p C5-6 foraminotomy    Referring Provider (OT) Dr. Louanne Skye    Onset Date/Surgical Date 09/10/20   onset approx 1.5 years   Hand Dominance Right    Next MD Visit 3 weeks      Precautions   Precautions Other (comment)    Precaution Comments no lifting over  5 lbs, no overhead lifting      Balance Screen   Has the patient fallen in the past 6 months No    Has the patient had a decrease in activity level because of a fear of falling?  No    Is the patient reluctant to leave their home because of a fear of falling?  No      Home  Environment   Family/patient expects  to be discharged to: Private residence    Lives With Spouse;Daughter      Prior Function   Level of Independence Independent    Vocation On disability      ADL   Eating/Feeding Modified independent    Grooming Modified independent    Upper Body Bathing Modified independent    Lower Body Bathing Modified independent    Upper Body Dressing Independent    Lower Body Dressing Modified independent    Toilet Transfer Modified independent    Tub/Shower Transfer Modified independent      IADL   Shopping Shops independently for small purchases    Light Housekeeping Performs light daily tasks such as dishwashing, bed making    Meal Prep Able to complete simple warm meal prep      Mobility   Mobility Status Independent      Written Expression   Handwriting 100% legible      Sensation   Light Touch Impaired by gross assessment  numbness in finger tips   Hot/Cold Impaired by gross assessment numbness in fingertips     Coordination   9 Hole Peg Test Right;Left    Right 9 Hole Peg Test 28.25    Left 9 Hole Peg Test 27.75      ROM / Strength   AROM / PROM / Strength AROM;Strength      AROM   Overall AROM  Deficits;Due to precautions    Overall AROM Comments R shoulder flexion WFLS to 90*, overhead not tested due to precautions, elbow flexion/ extension 135/-10, decreased supination, forearm wrist and hand are grossly WFLs.     Strength   Overall Strength Deficits    Overall Strength Comments shoulder not tested due to precuations, elbow flexion/ extensionR 4/5, L 4+/5      Hand Function   Right Hand Grip (lbs) 31 lbs    Right Hand Lateral Pinch 12 lbs    Right Hand 3  Point Pinch 9 lbs   tip 5   Left Hand Grip (lbs) 57.3    Left Hand Lateral Pinch 13 lbs    Left 3 point pinch 13 lbs   tip 12                              OT Short Term Goals - 09/27/20 0836       OT SHORT TERM GOAL #1   Title I with inital HEP    Time 4    Period Weeks    Status New    Target Date 10/25/20      OT SHORT TERM GOAL #2   Title Pt will increase grip strength RUE to 36 lbs or greater for increased functional use.    Time 4    Period Weeks    Status New      OT SHORT TERM GOAL #3   Title I with adapted strategies for ADLS/ IADLS to maximize pt's safety and I    Time 4    Period Weeks    Status New      OT SHORT TERM GOAL #4   Title Pt will verbalize understanding of sensory precautions to increase for ADLs.    Time 4    Period Weeks    Status New               OT Long Term Goals - 09/27/20 0858       OT LONG TERM  GOAL #1   Title Pt will report improved ability to use RUE for light functional tasks at least 75% of the time    Time 8    Period Weeks    Status New      OT LONG TERM GOAL #2   Title Pt will increase RUE tip pinch and 3 pt pinch by 3 lbs each for increased functional use.    Time 8    Period Weeks    Status New      OT LONG TERM GOAL #3   Title Pt with updated HEP.    Time 8    Period Weeks    Status New                   Plan - 09/27/20 0827     Clinical Impression Statement 65 year old female with history of right UE numbness and tingling and weakness has had release of the right ulnar n at the elbow in 2021 and right C5-6 foraminotomy 04/02/20.She reports  of numbness right hand that is persisting and even worse than preop. EMG/NCV show right C6 radiculopathy.MRI with improved appearance of R C6 foramen with mildd narrowing. (Pt has hx of left ulnar n. Release as well. See snapshot for full PMH. Pt presents with the following deficits: decreased strength, sensory changes, ROM limited by  precautions, mild coordination deficits and pain which impedes performance of ADLS/IADLS. Pt can benefit from skilled occupational therapy to address these deficits in order to maximize pt's safety and I with ADLs/ IADLs. Pt has a 5 lbs lifting retriction and she has been instructed to avoid overhead lifting/ reaching.    OT Occupational Profile and History Detailed Assessment- Review of Records and additional review of physical, cognitive, psychosocial history related to current functional performance    Occupational performance deficits (Please refer to evaluation for details): ADL's;IADL's;Rest and Sleep;Leisure;Social Participation    Body Structure / Function / Physical Skills ADL;Strength;Flexibility;UE functional use;Pain;FMC;Coordination;ROM;GMC;Dexterity;IADL;Sensation;Decreased knowledge of use of DME;Decreased knowledge of precautions    Rehab Potential Good    Clinical Decision Making Limited treatment options, no task modification necessary    Comorbidities Affecting Occupational Performance: May have comorbidities impacting occupational performance    Modification or Assistance to Complete Evaluation  Min-Moderate modification of tasks or assist with assess necessary to complete eval    OT Frequency 2x / week   may d/c after 6 weeks dep on progress   OT Duration 8 weeks    OT Treatment/Interventions Self-care/ADL training;Therapeutic exercise;Splinting;Manual Therapy;Neuromuscular education;Ultrasound;Manual lymph drainage;Therapeutic activities;Cognitive remediation/compensation;DME and/or AE instruction;Paraffin;Cryotherapy;Fluidtherapy;Electrical Stimulation;Patient/family education;Passive range of motion;Contrast Bath;Moist Heat    Plan initiate HEP for grip and pinch, can do cane exercises in limited range(do not exceed 90*)    Consulted and Agree with Plan of Care Patient             Patient will benefit from skilled therapeutic intervention in order to improve the following  deficits and impairments:   Body Structure / Function / Physical Skills: ADL, Strength, Flexibility, UE functional use, Pain, FMC, Coordination, ROM, GMC, Dexterity, IADL, Sensation, Decreased knowledge of use of DME, Decreased knowledge of precautions       Visit Diagnosis: Pain in right arm - Plan: Ot plan of care cert/re-cert  Other disturbances of skin sensation - Plan: Ot plan of care cert/re-cert  Muscle weakness (generalized) - Plan: Ot plan of care cert/re-cert  Other lack of coordination - Plan: Ot plan of care cert/re-cert  Problem List Patient Active Problem List   Diagnosis Date Noted   Other spondylosis with radiculopathy, cervical region 04/02/2020    Class: Chronic   Pain in finger of left hand 02/24/2020   Lung nodules 11/30/2019   Neck pain 08/04/2019   Acute pain of right shoulder 04/27/2019   Neck mass 04/27/2019   Postmenopausal bleeding 06/16/2018   S/P hysterectomy 06/16/2018   Chronic right shoulder pain 10/28/2016   Impingement syndrome of right shoulder 10/28/2016   Osteoarthritis of left knee 12/15/2014   Status post total left knee replacement 12/15/2014   Hand pain, left 04/21/2014   Cat bite of finger 04/21/2014   Asthma 04/21/2014   Tobacco abuse 04/21/2014   COPD (chronic obstructive pulmonary disease) ([place])    Effusion of left knee joint 11/03/2013   Arthritis of right knee 12/03/2012   Degenerative arthritis of hip 05/02/2011    Katrina [name] 09/28/2020, 7:44 AM [facility] [address] [place], [place], [address] Phone: [phone]   Fax:  [phone]  Name: Katrina [name] MRN: [id number] Date of Birth: [birth year]

## 2020-10-02 ENCOUNTER — Ambulatory Visit: Payer: Medicare Other | Admitting: Occupational Therapy

## 2020-10-04 ENCOUNTER — Telehealth: Payer: Self-pay | Admitting: Occupational Therapy

## 2020-10-04 ENCOUNTER — Ambulatory Visit: Payer: Medicare Other | Admitting: Occupational Therapy

## 2020-10-04 NOTE — Telephone Encounter (Signed)
Dr. Louanne Skye,  Katrina [name] is receiving occupational therapy at our site. Does she continue to have a 5 lbs lifting restriction and  a no overhead lifting restriction? Can she perform shoulder flexion overhead  without weight as a part of therapy? Can she perform low range, light theraband exercises  for gentle strengthening?  Please feel free to contact me. Sincerely, Theone Murdoch, OTR/L

## 2020-10-11 ENCOUNTER — Ambulatory Visit (INDEPENDENT_AMBULATORY_CARE_PROVIDER_SITE_OTHER): Payer: Medicare Other | Admitting: Specialist

## 2020-10-11 ENCOUNTER — Other Ambulatory Visit: Payer: Self-pay

## 2020-10-11 DIAGNOSIS — M542 Cervicalgia: Secondary | ICD-10-CM

## 2020-10-11 DIAGNOSIS — M5412 Radiculopathy, cervical region: Secondary | ICD-10-CM

## 2020-10-11 DIAGNOSIS — M25511 Pain in right shoulder: Secondary | ICD-10-CM

## 2020-10-11 DIAGNOSIS — G5621 Lesion of ulnar nerve, right upper limb: Secondary | ICD-10-CM

## 2020-10-11 NOTE — Progress Notes (Signed)
Office Visit Note   Patient: Katrina [name]           Date of Birth: [birth year]           MRN: [id number] Visit Date: 10/11/2020              Requested by: Eunice Blase, MD [address] [place],  [place] [address] PCP: Eunice Blase, MD   Assessment & Plan: Visit Diagnoses:  1. Ulnar neuropathy of right upper extremity   2. Cervical radiculopathy   3. Neck pain   4. Cubital tunnel syndrome on right   5. Right shoulder pain, unspecified chronicity     Plan: Avoid overhead lifting and overhead use of the arms. Do not lift greater than 15 lbs. Adjust head rest in vehicle to prevent hyperextension if rear ended. Take extra precautions to avoid falling.  Follow-Up Instructions: No follow-ups on file.   Orders:  No orders of the defined types were placed in this encounter.  No orders of the defined types were placed in this encounter.     Procedures: No procedures performed   Clinical Data: No additional findings.   Subjective: Chief Complaint  Patient presents with   Neck - Follow-up    Doing the same   Right Arm - Follow-up    Using arm and trying to just live with it.    65 year old female with history of neck and right arm pain, had right arm ulnar neurolysis and shoulder surgery. Persistent pain with right foramenal narrowing C5-6 and she underwent right C5-6 foramenotomy with go relief of right arm pain and she is working on right UE strengthening and ROM.   Review of Systems  Constitutional: Negative.   HENT: Negative.    Eyes: Negative.   Respiratory: Negative.    Cardiovascular: Negative.   Gastrointestinal: Negative.   Endocrine: Negative.   Genitourinary: Negative.   Musculoskeletal: Negative.   Skin: Negative.   Allergic/Immunologic: Negative.   Neurological: Negative.   Hematological: Negative.   Psychiatric/Behavioral: Negative.      Objective: Vital Signs: There were no vitals taken for this visit.  Physical  Exam Constitutional:      Appearance: She is well-developed.  HENT:     Head: Normocephalic and atraumatic.  Eyes:     Pupils: Pupils are equal, round, and reactive to light.  Pulmonary:     Effort: Pulmonary effort is normal.     Breath sounds: Normal breath sounds.  Abdominal:     General: Bowel sounds are normal.     Palpations: Abdomen is soft.  Musculoskeletal:     Cervical back: Normal range of motion and neck supple.  Skin:    General: Skin is warm and dry.  Neurological:     Mental Status: She is alert and oriented to person, place, and time.  Psychiatric:        Behavior: Behavior normal.        Thought Content: Thought content normal.        Judgment: Judgment normal.   Back Exam   Tenderness  The patient is experiencing tenderness in the cervical.  Range of Motion  Extension:  abnormal  Flexion:  abnormal  Lateral bend right:  normal  Lateral bend left:  abnormal  Rotation right:  normal   Comments:  Motor is intact right and left arm. Numbness in the right thumb.     Specialty Comments:  No specialty comments available.  Imaging: No results found.  PMFS History: Patient Active Problem List   Diagnosis Date Noted   Other spondylosis with radiculopathy, cervical region 04/02/2020    Priority: High    Class: Chronic   Pain in finger of left hand 02/24/2020   Lung nodules 11/30/2019   Neck pain 08/04/2019   Acute pain of right shoulder 04/27/2019   Neck mass 04/27/2019   Postmenopausal bleeding 06/16/2018   S/P hysterectomy 06/16/2018   Chronic right shoulder pain 10/28/2016   Impingement syndrome of right shoulder 10/28/2016   Osteoarthritis of left knee 12/15/2014   Status post total left knee replacement 12/15/2014   Hand pain, left 04/21/2014   Cat bite of finger 04/21/2014   Asthma 04/21/2014   Tobacco abuse 04/21/2014   COPD (chronic obstructive pulmonary disease) ([facility])    Effusion of left knee joint 11/03/2013   Arthritis of right  knee 12/03/2012   Degenerative arthritis of hip 05/02/2011   Past Medical History:  Diagnosis Date   Anxiety    Arthritis    knees, hips, elbows    Asthma    hx of asthma 3/12- hospitalized    COPD (chronic obstructive pulmonary disease) ([facility])    Depression    GERD (gastroesophageal reflux disease)    PRIOR TO HAVING GALLBLADDER REMOVED   Headache    from her neck   Left bundle branch block 06/28/2010   Lung infection 2015   Lung infection     Family History  Problem Relation Age of Onset   Heart disease Mother     Past Surgical History:  Procedure Laterality Date   BACK SURGERY     x2   CARPAL TUNNEL RELEASE     left    CESAREAN SECTION     x 2   CHOLECYSTECTOMY     COLONOSCOPY  10/2017   DILATATION & CURETTAGE/HYSTEROSCOPY WITH MYOSURE N/A 12/10/2017   Procedure: DILATATION & CURETTAGE/HYSTEROSCOPY WITH MYOSURE;  Surgeon: Christophe Louis, MD;  Location: [facility];  Service: Gynecology;  Laterality: N/A;  POSSIBLE MYOMECTOMY VS POLYPECTOMY WITH MYOSURE   DILATION AND CURETTAGE OF UTERUS     ELBOW SURGERY     JOINT REPLACEMENT     KNEE ARTHROSCOPY     bilateral x 2    KNEE ARTHROSCOPY Left 11/03/2013   Procedure: LEFT KNEE ARTHROSCOPY WITH DEBRIDEMENT, PARTIAL SYNOVECTOMY;  Surgeon: Mcarthur Rossetti, MD;  Location: [facility];  Service: Orthopedics;  Laterality: Left;   LAPAROSCOPY     left elbow surgery      OTHER SURGICAL HISTORY     arthroswcopic surgery right knee    OTHER SURGICAL HISTORY     arthroscopic surgery left knee x 2    OTHER SURGICAL HISTORY     bunionectomy right foot    OTHER SURGICAL HISTORY     C Section x 2    OTHER SURGICAL HISTORY     carpal tunnel on left    POSTERIOR CERVICAL FUSION/FORAMINOTOMY N/A 04/02/2020   Procedure: RIGHT C5-6 FORAMINOTOMY;  Surgeon: Jessy Oto, MD;  Location: [facility];  Service: Orthopedics;  Laterality: N/A;   RADIOLOGY WITH ANESTHESIA N/A 07/28/2019   Procedure: MRI WITH ANESTHESIA OF NECK SOFT TISSUE ONLY WITH AND  WITHOUT CONTRAST;  Surgeon: Radiologist, Medication, MD;  Location: [facility];  Service: Radiology;  Laterality: N/A;   RADIOLOGY WITH ANESTHESIA N/A 10/04/2019   Procedure: MRI WITH ANESTHESIA RIGHT SHOULDER WITHOUT CONTRAST,MRI OF CERVICAL SPINE WITHOUT CONTRAST;  Surgeon: Radiologist, Medication, MD;  Location: [facility];  Service: Radiology;  Laterality: N/A;   RADIOLOGY WITH ANESTHESIA N/A 08/30/2020   Procedure: MRI WITH ANESTHESIA CERVICAL SPINE WITHOUT CONTRAST;  Surgeon: Radiologist, Medication, MD;  Location: [facility];  Service: Radiology;  Laterality: N/A;   right foot bunionectomy      ROBOTIC ASSISTED TOTAL HYSTERECTOMY WITH BILATERAL SALPINGO OOPHERECTOMY Bilateral 06/16/2018   Procedure: XI ROBOTIC ASSISTED TOTAL HYSTERECTOMY WITH RIGHT SALPINGO OOPHORECTOMY;  Surgeon: Christophe Louis, MD;  Location[facility];  Service: Gynecology;  Laterality: Bilateral;   TOTAL HIP ARTHROPLASTY  05/02/2011   Procedure: TOTAL HIP ARTHROPLASTY ANTERIOR APPROACH;  Surgeon: Mcarthur Rossetti, MD;  Location: [facility];  Service: Orthopedics;  Laterality: Left;  Left Total Hip Arthroplasty, Direct Anterior Approach   TOTAL KNEE ARTHROPLASTY Right 12/03/2012   Procedure: RIGHT TOTAL KNEE ARTHROPLASTY;  Surgeon: Mcarthur Rossetti, MD;  Location: [facility];  Service: Orthopedics;  Laterality: Right;   TOTAL KNEE ARTHROPLASTY Left 12/15/2014   Procedure: LEFT TOTAL KNEE ARTHROPLASTY;  Surgeon: Mcarthur Rossetti, MD;  Location: [facility];  Service: Orthopedics;  Laterality: Left;   TUBAL LIGATION     ULNAR NERVE TRANSPOSITION     left    UPPER GI ENDOSCOPY     x 2   Social History   Occupational History   Not on file  Tobacco Use   Smoking status: Every Day    Packs/day: 1.00    Years: 25.00    Pack years: 25.00    Types: Cigarettes   Smokeless tobacco: Never  Vaping Use   Vaping Use: Never used  Substance and Sexual Activity   Alcohol use: Yes    Alcohol/week: 49.0 standard drinks     Types: 49 Cans of beer per week   Drug use: No    Comment: hx of 34 years ago marijuana    Sexual activity: Not on file

## 2020-10-11 NOTE — Patient Instructions (Signed)
Plan: Avoid overhead lifting and overhead use of the arms. Do not lift greater than 15 lbs. Adjust head rest in vehicle to prevent hyperextension if rear ended. Take extra precautions to avoid falling.

## 2020-10-16 ENCOUNTER — Ambulatory Visit: Payer: Medicare Other | Admitting: Occupational Therapy

## 2020-10-18 ENCOUNTER — Ambulatory Visit: Payer: Medicare Other | Admitting: Occupational Therapy

## 2020-10-23 ENCOUNTER — Encounter: Payer: Medicare Other | Admitting: Occupational Therapy

## 2020-10-25 ENCOUNTER — Encounter: Payer: Medicare Other | Admitting: Occupational Therapy

## 2020-10-30 ENCOUNTER — Encounter: Payer: Medicare Other | Admitting: Occupational Therapy

## 2020-11-01 ENCOUNTER — Encounter: Payer: Medicare Other | Admitting: Occupational Therapy

## 2020-11-06 ENCOUNTER — Encounter: Payer: Medicare Other | Admitting: Occupational Therapy

## 2020-11-08 ENCOUNTER — Encounter: Payer: Medicare Other | Admitting: Occupational Therapy

## 2020-12-03 ENCOUNTER — Ambulatory Visit: Payer: Medicare Other | Admitting: Nurse Practitioner

## 2020-12-05 ENCOUNTER — Encounter: Payer: Self-pay | Admitting: Nurse Practitioner

## 2020-12-05 ENCOUNTER — Other Ambulatory Visit: Payer: Self-pay

## 2020-12-05 ENCOUNTER — Ambulatory Visit (INDEPENDENT_AMBULATORY_CARE_PROVIDER_SITE_OTHER): Payer: Medicare Other | Admitting: Nurse Practitioner

## 2020-12-05 VITALS — BP 121/72 | HR 86 | Temp 97.0°F | Ht 67.0 in | Wt 219.4 lb

## 2020-12-05 DIAGNOSIS — Z7689 Persons encountering health services in other specified circumstances: Secondary | ICD-10-CM

## 2020-12-05 DIAGNOSIS — F17210 Nicotine dependence, cigarettes, uncomplicated: Secondary | ICD-10-CM

## 2020-12-05 DIAGNOSIS — R918 Other nonspecific abnormal finding of lung field: Secondary | ICD-10-CM

## 2020-12-05 DIAGNOSIS — Z6834 Body mass index (BMI) 34.0-34.9, adult: Secondary | ICD-10-CM

## 2020-12-05 DIAGNOSIS — J431 Panlobular emphysema: Secondary | ICD-10-CM | POA: Diagnosis not present

## 2020-12-05 DIAGNOSIS — L84 Corns and callosities: Secondary | ICD-10-CM

## 2020-12-05 NOTE — Progress Notes (Signed)
New Patient Office Visit  Subjective:  Patient ID: Katrina [name], female    DOB: 11/10/1955  Age: 65 y.o. MRN: [id number]  CC:  Chief Complaint  Patient presents with   New Patient (Initial Visit)    HPI Katrina [name] presents to establish new primary care provider.  She is coming from another practice.  She has history of COPD.  She does use Flovent twice daily.  She uses rescue inhaler as needed and as prescribed.  She does have excess mucus production.  States she is constantly coughing.  Feels like she has cotton in the back of her throat and is unable to cough it out.  She reports constant postnasal drip.  She is increasingly concerned about this as her daughter has been diagnosed with lung cancer.  This patient has been scanned for lung cancer in the past did show a spot on the left side of her lung which has not been followed up since.  The patient continues to smoke cigarettes.  She smokes between 1 and 2 packs of cigarettes per day. The patient does have a callus on the bottom of her left foot.  This is starting to bother her and cause pain.  She has previously had to have a bunion removed from her right foot.  She has noticed some numbness in both great toes. Patient has had a total hysterectomy.  Left ovary is remaining.  She states she was told the left ovary was so wrapped and scar tissue from prior surgeries they were unable to remove it safely. The patient denies chest pain or chest pressure.  She does have shortness of breath.  Shortness of breath seems to be worse with heat.  She denies headaches or visual changes.  She denies nausea, vomiting, or changes in digestive health.  Past Medical History:  Diagnosis Date   Anxiety    Arthritis    knees, hips, elbows    Asthma    hx of asthma 3/12- hospitalized    COPD (chronic obstructive pulmonary disease) ([facility])    Depression    GERD (gastroesophageal reflux disease)    PRIOR TO HAVING GALLBLADDER REMOVED   Headache     from her neck   Left bundle branch block 06/28/2010   Lung infection 2015   Lung infection     Past Surgical History:  Procedure Laterality Date   BACK SURGERY     x2   CARPAL TUNNEL RELEASE     left    CESAREAN SECTION     x 2   CHOLECYSTECTOMY     COLONOSCOPY  10/2017   DILATATION & CURETTAGE/HYSTEROSCOPY WITH MYOSURE N/A 12/10/2017   Procedure: DILATATION & CURETTAGE/HYSTEROSCOPY WITH MYOSURE;  Surgeon: Christophe Louis, MD;  Location: [facility];  Service: Gynecology;  Laterality: N/A;  POSSIBLE MYOMECTOMY VS POLYPECTOMY WITH MYOSURE   DILATION AND CURETTAGE OF UTERUS     ELBOW SURGERY     JOINT REPLACEMENT     KNEE ARTHROSCOPY     bilateral x 2    KNEE ARTHROSCOPY Left 11/03/2013   Procedure: LEFT KNEE ARTHROSCOPY WITH DEBRIDEMENT, PARTIAL SYNOVECTOMY;  Surgeon: Mcarthur Rossetti, MD;  Location: [facility];  Service: Orthopedics;  Laterality: Left;   LAPAROSCOPY     left elbow surgery      OTHER SURGICAL HISTORY     arthroswcopic surgery right knee    OTHER SURGICAL HISTORY     arthroscopic surgery left knee x 2    OTHER SURGICAL  HISTORY     bunionectomy right foot    OTHER SURGICAL HISTORY     C Section x 2    OTHER SURGICAL HISTORY     carpal tunnel on left    POSTERIOR CERVICAL FUSION/FORAMINOTOMY N/A 04/02/2020   Procedure: RIGHT C5-6 FORAMINOTOMY;  Surgeon: Jessy Oto, MD;  Location: [facility];  Service: Orthopedics;  Laterality: N/A;   RADIOLOGY WITH ANESTHESIA N/A 07/28/2019   Procedure: MRI WITH ANESTHESIA OF NECK SOFT TISSUE ONLY WITH AND WITHOUT CONTRAST;  Surgeon: Radiologist, Medication, MD;  Location: [facility];  Service: Radiology;  Laterality: N/A;   RADIOLOGY WITH ANESTHESIA N/A 10/04/2019   Procedure: MRI WITH ANESTHESIA RIGHT SHOULDER WITHOUT CONTRAST,MRI OF CERVICAL SPINE WITHOUT CONTRAST;  Surgeon: Radiologist, Medication, MD;  Location: [facility];  Service: Radiology;  Laterality: N/A;   RADIOLOGY WITH ANESTHESIA N/A 08/30/2020   Procedure: MRI WITH ANESTHESIA  CERVICAL SPINE WITHOUT CONTRAST;  Surgeon: Radiologist, Medication, MD;  Location: [facility];  Service: Radiology;  Laterality: N/A;   right foot bunionectomy      ROBOTIC ASSISTED TOTAL HYSTERECTOMY WITH BILATERAL SALPINGO OOPHERECTOMY Bilateral 06/16/2018   Procedure: XI ROBOTIC ASSISTED TOTAL HYSTERECTOMY WITH RIGHT SALPINGO OOPHORECTOMY;  Surgeon: Christophe Louis, MD;  Location: [facility];  Service: Gynecology;  Laterality: Bilateral;   TOTAL HIP ARTHROPLASTY  05/02/2011   Procedure: TOTAL HIP ARTHROPLASTY ANTERIOR APPROACH;  Surgeon: Mcarthur Rossetti, MD;  Location: [facility];  Service: Orthopedics;  Laterality: Left;  Left Total Hip Arthroplasty, Direct Anterior Approach   TOTAL KNEE ARTHROPLASTY Right 12/03/2012   Procedure: RIGHT TOTAL KNEE ARTHROPLASTY;  Surgeon: Mcarthur Rossetti, MD;  Location: [facility];  Service: Orthopedics;  Laterality: Right;   TOTAL KNEE ARTHROPLASTY Left 12/15/2014   Procedure: LEFT TOTAL KNEE ARTHROPLASTY;  Surgeon: Mcarthur Rossetti, MD;  Location: [facility];  Service: Orthopedics;  Laterality: Left;   TUBAL LIGATION     ULNAR NERVE TRANSPOSITION     left    UPPER GI ENDOSCOPY     x 2    Family History  Problem Relation Age of Onset   Heart disease Mother    High Cholesterol Mother    High blood pressure Mother    Depression Brother    Diabetes Brother     Social History   Socioeconomic History   Marital status: Married    Spouse name: Not on file   Number of children: Not on file   Years of education: Not on file   Highest education level: Not on file  Occupational History   Not on file  Tobacco Use   Smoking status: Every Day    Packs/day: 1.00    Years: 25.00    Pack years: 25.00    Types: Cigarettes   Smokeless tobacco: Never  Vaping Use   Vaping Use: Never used  Substance and Sexual Activity   Alcohol use: Yes    Alcohol/week: 49.0 standard drinks    Types: 49 Cans of beer per week   Drug use: No    Comment: hx of  34 years ago marijuana    Sexual activity: Not on file  Other Topics Concern   Not on file  Social History Narrative   Not on file   Social Determinants of Health   Financial Resource Strain: Not on file  Food Insecurity: Not on file  Transportation Needs: Not on file  Physical Activity: Not on file  Stress: Not on file  Social Connections: Not on file  Intimate Partner Violence: Not on file    ROS Review of Systems  Constitutional:  Negative for activity change, appetite change, chills, fatigue and fever.  HENT:  Positive for postnasal drip. Negative for congestion, rhinorrhea, sinus pressure, sinus pain, sneezing and sore throat.   Eyes: Negative.   Respiratory:  Positive for cough, shortness of breath and wheezing. Negative for chest tightness.   Cardiovascular:  Negative for chest pain and palpitations.  Gastrointestinal:  Negative for abdominal pain, constipation, diarrhea, nausea and vomiting.  Endocrine: Negative for cold intolerance, heat intolerance, polydipsia and polyuria.  Genitourinary:  Negative for dyspareunia, dysuria, flank pain, frequency and urgency.  Musculoskeletal:  Negative for arthralgias, back pain and myalgias.  Skin:  Negative for rash.  Allergic/Immunologic: Negative for environmental allergies.  Neurological:  Negative for dizziness, weakness and headaches.  Hematological:  Negative for adenopathy.  Psychiatric/Behavioral:  The patient is not nervous/anxious.    Objective:   Today's Vitals   12/05/20 1116  BP: 121/72  Pulse: 86  Temp: (!) 97 F (36.1 C)  SpO2: 95%  Weight: 219 lb 6.4 oz (99.5 kg)  Height: '5\' 7"'$  (1.702 m)   Body mass index is 34.36 kg/m.   Physical Exam Vitals and nursing note reviewed.  Constitutional:      Appearance: Normal appearance. She is well-developed.  HENT:     Head: Normocephalic and atraumatic.  Eyes:     Extraocular Movements: Extraocular movements intact.     Conjunctiva/sclera: Conjunctivae normal.      Pupils: Pupils are equal, round, and reactive to light.  Cardiovascular:     Rate and Rhythm: Normal rate and regular rhythm.     Pulses: Normal pulses.     Heart sounds: Normal heart sounds.  Pulmonary:     Effort: Pulmonary effort is normal.     Breath sounds: Wheezing present.     Comments: Patient has congested, loose sounding cough.  Nonproductive at this time. Abdominal:     Palpations: Abdomen is soft.  Musculoskeletal:        General: Normal range of motion.     Cervical back: Normal range of motion and neck supple.  Lymphadenopathy:     Cervical: No cervical adenopathy.  Skin:    General: Skin is warm and dry.     Capillary Refill: Capillary refill takes less than 2 seconds.  Neurological:     General: No focal deficit present.     Mental Status: She is alert and oriented to person, place, and time.  Psychiatric:        Mood and Affect: Mood normal.        Behavior: Behavior normal.        Thought Content: Thought content normal.        Judgment: Judgment normal.    Assessment & Plan:  1. Encounter to establish care Appointment today to establish new primary care provider.  2. Panlobular emphysema ([place]) Will get CT chest for lung cancer screening and follow-up of previous pulmonary nodules.  Refer to pulmonology for further evaluation and treatment. - CT CHEST NODULE FOLLOW UP LOW DOSE W/O; Future - Ambulatory referral to Pulmonology  3. Lung nodules Will get CT chest for lung cancer screening and follow-up of previous pulmonary nodules.  Refer to pulmonology for further evaluation and treatment. - CT CHEST NODULE FOLLOW UP LOW DOSE W/O; Future - Ambulatory referral to Pulmonology  4. Cigarette smoker motivated to quit Patient smoking between 1 and 2 packs of cigarettes per day.  We will get low-dose chest CT scanning for lung cancer screening.  Refer to pulmonology for further evaluation and treatment. - CT CHEST NODULE FOLLOW UP LOW DOSE W/O; Future -  Ambulatory referral to Pulmonology  5. Callus of foot New callus formation of left foot.  Causing pain.  Refer to podiatry for further evaluation and treatment. - Ambulatory referral to Podiatry  6. Body mass index (BMI) of 34.0-34.9 in adult Patient recommended a 1500-calorie diet.  She should consume low-fat, low-cholesterol diet and gradually incorporate exercise into her daily routine.  Problem List Items Addressed This Visit       Respiratory   COPD (chronic obstructive pulmonary disease) ([place])   Relevant Orders   CT CHEST NODULE FOLLOW UP LOW DOSE W/O   Ambulatory referral to Pulmonology     Musculoskeletal and Integument   Callus of foot   Relevant Orders   Ambulatory referral to Podiatry     Other   Lung nodules   Relevant Orders   CT CHEST NODULE FOLLOW UP LOW DOSE W/O   Ambulatory referral to Pulmonology   Encounter to establish care - Primary   Cigarette smoker motivated to quit   Relevant Orders   CT CHEST NODULE FOLLOW UP LOW DOSE W/O   Ambulatory referral to Pulmonology   Body mass index (BMI) of 34.0-34.9 in adult    Outpatient Encounter Medications as of 12/05/2020  Medication Sig   acetaminophen (TYLENOL) 500 MG tablet Take 1,000 mg by mouth every 6 (six) hours as needed for moderate pain.   albuterol (VENTOLIN HFA) 108 (90 Base) MCG/ACT inhaler Inhale 2 puffs into the lungs every 6 (six) hours as needed for wheezing or shortness of breath.   Multiple Vitamin (MULTIVITAMIN WITH MINERALS) TABS tablet Take 2 tablets by mouth daily.   amitriptyline (ELAVIL) 10 MG tablet Take 1 tablet (10 mg total) by mouth at bedtime as needed for sleep. (Patient not taking: No sig reported)   docusate sodium (COLACE) 100 MG capsule Take 1 capsule (100 mg total) by mouth 2 (two) times daily. (Patient not taking: No sig reported)   fluticasone (FLOVENT HFA) 44 MCG/ACT inhaler Inhale 1 puff into the lungs in the morning and at bedtime. (Patient not taking: No sig reported)    gabapentin (NEURONTIN) 300 MG capsule Take 1 capsule (300 mg total) by mouth 3 (three) times daily. (Patient not taking: No sig reported)   HYDROcodone-acetaminophen (NORCO) 10-325 MG tablet Take 1 tablet by mouth every 4 (four) hours as needed for moderate pain ((score 4 to 6)). (Patient not taking: No sig reported)   methocarbamol (ROBAXIN) 500 MG tablet Take 1 tablet (500 mg total) by mouth every 6 (six) hours as needed for muscle spasms. (Patient not taking: No sig reported)   [DISCONTINUED] clonazePAM (KLONOPIN) 0.5 MG tablet Take 1 tablet (0.5 mg total) by mouth 2 (two) times daily as needed for anxiety.   No facility-administered encounter medications on file as of 12/05/2020.   This note was dictated using Systems analyst. Rapid proofreading was performed to expedite the delivery of the information. Despite proofreading, phonetic errors will occur which are common with this voice recognition software. Please take this into consideration. If there are any concerns, please contact our office.    Follow-up: Return in about 4 weeks (around 01/02/2021) for medicare wellness, FBW a week prior to visit.   Ronnell Freshwater, NP

## 2020-12-16 DIAGNOSIS — F17210 Nicotine dependence, cigarettes, uncomplicated: Secondary | ICD-10-CM | POA: Insufficient documentation

## 2020-12-16 DIAGNOSIS — Z7689 Persons encountering health services in other specified circumstances: Secondary | ICD-10-CM | POA: Insufficient documentation

## 2020-12-16 DIAGNOSIS — L84 Corns and callosities: Secondary | ICD-10-CM | POA: Insufficient documentation

## 2020-12-16 DIAGNOSIS — Z6839 Body mass index (BMI) 39.0-39.9, adult: Secondary | ICD-10-CM | POA: Insufficient documentation

## 2020-12-16 DIAGNOSIS — Z6834 Body mass index (BMI) 34.0-34.9, adult: Secondary | ICD-10-CM | POA: Insufficient documentation

## 2020-12-16 NOTE — Patient Instructions (Signed)

## 2020-12-26 ENCOUNTER — Telehealth: Payer: Self-pay | Admitting: Radiology

## 2020-12-26 ENCOUNTER — Telehealth: Payer: Self-pay | Admitting: Nurse Practitioner

## 2020-12-26 MED ORDER — FLUTICASONE PROPIONATE HFA 44 MCG/ACT IN AERO: 1.0000 | INHALATION_SPRAY | Freq: Two times a day (BID) | RESPIRATORY_TRACT | 2 refills | Status: DC

## 2020-12-26 NOTE — Telephone Encounter (Signed)
LVM for pt to call the office to schedule AWV with [facility] on Sunday. Please schedule this appt if pt calls the office.  Thanks

## 2020-12-26 NOTE — Addendum Note (Signed)
Addended by: Meyer Cory on: 12/26/2020 12:16 PM   Modules accepted: Orders

## 2020-12-26 NOTE — Telephone Encounter (Signed)
Received fax request from pharmacy requesting refill on following:  Flovent HFA 44 MCG AER Inhale 2 puffs by mouth twice daily x one month, then 1 puff by mouth twice daily after that Quantity 11  Requests refills  [place] patient-please advise.

## 2020-12-26 NOTE — Telephone Encounter (Signed)
Sent to pharmacy with 2 refills per Dr. Lorin Mercy. Note added to pharmacy that patient will need to get addl refills from new PCP.

## 2020-12-27 ENCOUNTER — Other Ambulatory Visit: Payer: Self-pay | Admitting: Specialist

## 2020-12-27 MED ORDER — FLUTICASONE PROPIONATE HFA 110 MCG/ACT IN AERO: 1.0000 | INHALATION_SPRAY | Freq: Two times a day (BID) | RESPIRATORY_TRACT | 2 refills | Status: DC

## 2020-12-28 ENCOUNTER — Other Ambulatory Visit: Payer: Self-pay

## 2020-12-28 ENCOUNTER — Ambulatory Visit
Admission: RE | Admit: 2020-12-28 | Discharge: 2020-12-28 | Disposition: A | Discharge status: Home / Self Care | Payer: Medicare Other | Source: Ambulatory Visit | Attending: Nurse Practitioner | Admitting: Nurse Practitioner

## 2020-12-28 DIAGNOSIS — J431 Panlobular emphysema: Secondary | ICD-10-CM

## 2020-12-28 DIAGNOSIS — F17210 Nicotine dependence, cigarettes, uncomplicated: Secondary | ICD-10-CM | POA: Diagnosis not present

## 2020-12-28 DIAGNOSIS — R918 Other nonspecific abnormal finding of lung field: Secondary | ICD-10-CM

## 2020-12-31 ENCOUNTER — Encounter: Payer: Self-pay | Admitting: Nurse Practitioner

## 2020-12-31 ENCOUNTER — Other Ambulatory Visit: Payer: Self-pay

## 2020-12-31 ENCOUNTER — Encounter: Payer: Self-pay | Admitting: Podiatry

## 2020-12-31 ENCOUNTER — Ambulatory Visit: Payer: Medicare Other | Admitting: Podiatry

## 2020-12-31 DIAGNOSIS — Q828 Other specified congenital malformations of skin: Secondary | ICD-10-CM

## 2020-12-31 DIAGNOSIS — M2012 Hallux valgus (acquired), left foot: Secondary | ICD-10-CM

## 2020-12-31 DIAGNOSIS — M2580 Other specified joint disorders, unspecified joint: Secondary | ICD-10-CM | POA: Insufficient documentation

## 2020-12-31 NOTE — Progress Notes (Signed)
This patient presents to the office with severe pain under her big toe joint left foot.  She says she has been dealing with the callus for years with debridement and padding.  She has also been treated with debridement by her doctor.  She has pain out of proportion to the bottom of her big toe joint.  She has history of surgery on her right foot. She presents to the office for evaluation and treatment.  Vascular  Dorsalis pedis and posterior tibial pulses are palpable  B/L.  Capillary return  WNL.  Temperature gradient is  WNL.  Skin turgor  WNL  Sensorium  Senn Weinstein monofilament wire  WNL/diminished.. Normal tactile sensation.  Nail Exam  Patient has normal nails with no evidence of bacterial or fungal infection.  Orthopedic  Exam  Muscle tone and muscle strength  WNL.  No limitations of motion feet  B/L.  No crepitus or joint effusion noted.  Foot type is unremarkable and digits show no abnormalities. 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  Patient has pain at the dorsomedial  ist MPJ  left foot.  Severe palpable pain under tibial sesamoid with no evidence of redness or swelling.  Skin  No open lesions.  Normal skin texture and turgor.  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  IE. Debride callus with # 15 blade.  Discussed this problem with this patient.  Told her her problem stems from HAV 1st MPJ left foot.  Told her to have a surgical consult for removal of porokeratosis or bunion correction.  RTC prn   Gardiner Barefoot DPM

## 2020-12-31 NOTE — Progress Notes (Signed)
Chest CT showing aoric atherosclerosis and emphysema. Lung cancer screening is benign. Repeat CT in one year. Mychart message sent to patient. Will discuss in detail at next, in office visit .

## 2021-01-18 ENCOUNTER — Other Ambulatory Visit: Payer: Self-pay

## 2021-01-18 ENCOUNTER — Ambulatory Visit (INDEPENDENT_AMBULATORY_CARE_PROVIDER_SITE_OTHER): Payer: Medicare Other | Admitting: Podiatry

## 2021-01-18 ENCOUNTER — Ambulatory Visit (INDEPENDENT_AMBULATORY_CARE_PROVIDER_SITE_OTHER): Payer: Medicare Other

## 2021-01-18 DIAGNOSIS — Q828 Other specified congenital malformations of skin: Secondary | ICD-10-CM

## 2021-01-18 DIAGNOSIS — Z01818 Encounter for other preprocedural examination: Secondary | ICD-10-CM

## 2021-01-18 DIAGNOSIS — M2012 Hallux valgus (acquired), left foot: Secondary | ICD-10-CM | POA: Diagnosis not present

## 2021-01-23 NOTE — Progress Notes (Signed)
Subjective:  Patient ID: Katrina [name], female    DOB: 29-Jan-1956,  MRN: [id number]  Chief Complaint  Patient presents with   Foot Pain    Left foot callus     65 y.o. female presents with the above complaint.  Patient presents with complaint left bunion deformity.  Patient states that it been going for quite some time worse with ambulation.  She was being treated conservatively by Dr. Prudence Davidson who has referred the patient over to me for surgical intervention.  She states it hurts with ambulation hurts right on the bone.  She has failed all conservative treatment options.  She denies any other acute complaints.  Review of Systems: Negative except as noted in the HPI. Denies N/V/F/Ch.  Past Medical History:  Diagnosis Date   Anxiety    Arthritis    knees, hips, elbows    Asthma    hx of asthma 3/12- hospitalized    COPD (chronic obstructive pulmonary disease) (HCC)    Depression    GERD (gastroesophageal reflux disease)    PRIOR TO HAVING GALLBLADDER REMOVED   Headache    from her neck   Left bundle branch block 06/28/2010   Lung infection 2015   Lung infection     Current Outpatient Medications:    acetaminophen (TYLENOL) 500 MG tablet, Take 1,000 mg by mouth every 6 (six) hours as needed for moderate pain., Disp: , Rfl:    albuterol (VENTOLIN HFA) 108 (90 Base) MCG/ACT inhaler, Inhale 2 puffs into the lungs every 6 (six) hours as needed for wheezing or shortness of breath., Disp: 8 g, Rfl: 11   amitriptyline (ELAVIL) 10 MG tablet, Take 1 tablet (10 mg total) by mouth at bedtime as needed for sleep. (Patient not taking: No sig reported), Disp: 30 tablet, Rfl: 3   docusate sodium (COLACE) 100 MG capsule, Take 1 capsule (100 mg total) by mouth 2 (two) times daily. (Patient not taking: No sig reported), Disp: 10 capsule, Rfl: 0   fluticasone (FLOVENT HFA) 110 MCG/ACT inhaler, Inhale 1 puff into the lungs in the morning and at bedtime., Disp: 11 each, Rfl: 2   gabapentin  (NEURONTIN) 300 MG capsule, Take 1 capsule (300 mg total) by mouth 3 (three) times daily. (Patient not taking: No sig reported), Disp: 90 capsule, Rfl: 2   HYDROcodone-acetaminophen (NORCO) 10-325 MG tablet, Take 1 tablet by mouth every 4 (four) hours as needed for moderate pain ((score 4 to 6)). (Patient not taking: No sig reported), Disp: 40 tablet, Rfl: 0   methocarbamol (ROBAXIN) 500 MG tablet, Take 1 tablet (500 mg total) by mouth every 6 (six) hours as needed for muscle spasms. (Patient not taking: No sig reported), Disp: 30 tablet, Rfl: 1   Multiple Vitamin (MULTIVITAMIN WITH MINERALS) TABS tablet, Take 2 tablets by mouth daily., Disp: , Rfl:   Social History   Tobacco Use  Smoking Status Every Day   Packs/day: 1.00   Years: 25.00   Pack years: 25.00   Types: Cigarettes  Smokeless Tobacco Never    Allergies  Allergen Reactions   Codeine Itching and Nausea Only   Objective:  There were no vitals filed for this visit. There is no height or weight on file to calculate BMI. Constitutional Well developed. Well nourished.  Vascular Dorsalis pedis pulses palpable bilaterally. Posterior tibial pulses palpable bilaterally. Capillary refill normal to all digits.  No cyanosis or clubbing noted. Pedal hair growth normal.  Neurologic Normal speech. Oriented to person, place, and time.  Epicritic sensation to light touch grossly present bilaterally.  Dermatologic Nails well groomed and normal in appearance. No open wounds. No skin lesions.  Orthopedic: Normal joint ROM without pain or crepitus bilaterally. Hallux abductovalgus deformity present Left 1st MPJ diminished range of motion. Left 1st TMT without gross hypermobility. Right 1st MPJ diminished range of motion  Right 1st TMT without gross hypermobility. Lesser digital contractures absent bilaterally.   Radiographs: Taken and reviewed. Hallux abductovalgus deformity present. Metatarsal parabola normal.  1st/2nd IMA:  Moderate; TSP: 6 out of 7.  Assessment:   1. Preoperative examination   2. Hallux abducto valgus, left    Plan:  Patient was evaluated and treated and all questions answered.  Hallux abductovalgus deformity, left -XR as above. -Patient has failed all conservative therapy and wishes to proceed with surgical intervention. All risks, benefits, and alternatives discussed with patient. No guarantees given. Consent reviewed and signed by patient. Post-op course explained at length. -Planned procedures: Chevron osteotomy with possible phalangeal osteotomy -Risk factors: None -I discussed my preoperative intraoperative postoperative plan in extensive detail.  Patient states understand like to proceed the risk.  She will be weightbearing as tolerated in cam boot. -Informed surgical risk consent was reviewed and read aloud to the patient.  I reviewed the films.  I have discussed my findings with the patient in great detail.  I have discussed all risks including but not limited to infection, stiffness, scarring, limp, disability, deformity, damage to blood vessels and nerves, numbness, poor healing, need for braces, arthritis, chronic pain, amputation, death.  All benefits and realistic expectations discussed in great detail.  I have made no promises as to the outcome.  I have provided realistic expectations.  I have offered the patient a 2nd opinion, which they have declined and assured me they preferred to proceed despite the risks   No follow-ups on file.

## 2021-01-31 ENCOUNTER — Telehealth: Payer: Self-pay | Admitting: Urology

## 2021-01-31 NOTE — Telephone Encounter (Signed)
DOS - 02/18/21  [place] BUNIONECTOMY LEFT --- 35075 DOUBLE OSTEOTOMY LEFT --- [id number]   [employer] EFFECTIVE DATE - 04/07/20  PLAN DEDUCTIBLE - $0.00 OUT OF POCKET - $5,900.00 W/ $5,526.91 REMAINING COINSURANCE - 0% COPAY - $0.00   NO PRIOR AUTH IS REQUIRED

## 2021-02-18 ENCOUNTER — Other Ambulatory Visit: Payer: Self-pay | Admitting: Podiatry

## 2021-02-18 ENCOUNTER — Telehealth: Payer: Self-pay | Admitting: Nurse Practitioner

## 2021-02-18 MED ORDER — IBUPROFEN 800 MG PO TABS: 800.0000 mg | ORAL_TABLET | Freq: Four times a day (QID) | ORAL | 1 refills | Status: DC | PRN

## 2021-02-18 MED ORDER — OXYCODONE-ACETAMINOPHEN 5-325 MG PO TABS: 1.0000 | ORAL_TABLET | ORAL | 0 refills | Status: DC | PRN

## 2021-02-18 NOTE — Telephone Encounter (Signed)
Hyman Bower with [facility] left a message on voicemail.  Gerald Stabs is concerned about Katrina [name]'s alcohol consumption.  Katrina [name] was scheduled for surgery and due to her PVS' and heart fluttering today, he cancelled her surgery.  Katrina [name] has been under a lot of street and consuming moonshine on a daily basis.  Gerald Stabs believes due to the alcohol consumption, this is what is causing her PVC's and heart fluttering.  Patient has appointment with you on 02/19/21.

## 2021-02-19 ENCOUNTER — Encounter: Payer: Self-pay | Admitting: Nurse Practitioner

## 2021-02-19 ENCOUNTER — Other Ambulatory Visit: Payer: Self-pay

## 2021-02-19 ENCOUNTER — Ambulatory Visit (INDEPENDENT_AMBULATORY_CARE_PROVIDER_SITE_OTHER): Payer: Medicare Other | Admitting: Nurse Practitioner

## 2021-02-19 VITALS — BP 144/82 | HR 95 | Temp 97.7°F | Ht 67.0 in | Wt 220.0 lb

## 2021-02-19 DIAGNOSIS — R9431 Abnormal electrocardiogram [ECG] [EKG]: Secondary | ICD-10-CM | POA: Diagnosis not present

## 2021-02-19 DIAGNOSIS — M2032 Hallux varus (acquired), left foot: Secondary | ICD-10-CM

## 2021-02-19 DIAGNOSIS — I447 Left bundle-branch block, unspecified: Secondary | ICD-10-CM | POA: Diagnosis not present

## 2021-02-19 NOTE — Progress Notes (Signed)
Established Patient Office Visit  Subjective:  Patient ID: Katrina [name], female    DOB: 03-22-1956  Age: 65 y.o. MRN: [id number]  CC:  Chief Complaint  Patient presents with   Follow-up    HPI Katrina [name] presents for evaluation of irregular heart rhythm. She was supposed to have surgery yesterday to remove bunion from the left foot. There was a phone call from the surgeon which states that the patient was having fluttering sensation in the heart. She states that after she had administration of some medication in her IV, she started feeling nauseated and had fluttering sensation in her heart. She had episode of tears then laughter. She and her daughters had been talking about travel and were discussing the purchase of moonshine in [place]. The phone call to our office from surgeon voiced concern that patient had been drinking moonshine on routine basis to deal with nerves and anxiety. The patient is in the office with her daughter today. Both assure me that the patient odes not consume moonshine or any alcohol on regular basis. If she drinks, her drink of choice is regular beer.  The patient denies chest pain, chest pressure, or unusual shortness of breath. She denies palpitations except for incident yesterday. ECG yesterday did show multiple PVCs. She also had left axis deviation and left bundle branch block. The left axis deviation and left bundle branch block had been present on prior EKGs, dating back to 2014. An EKG was performed today, again showing the left axis deviation and left bundle branch block. She has never seen cardiologist for this.   Past Medical History:  Diagnosis Date   Anxiety    Arthritis    knees, hips, elbows    Asthma    hx of asthma 3/12- hospitalized    COPD (chronic obstructive pulmonary disease) (HCC)    Depression    GERD (gastroesophageal reflux disease)    PRIOR TO HAVING GALLBLADDER REMOVED   Headache    from her neck   Left bundle  branch block 06/28/2010   Lung infection 2015   Lung infection     Past Surgical History:  Procedure Laterality Date   BACK SURGERY     x2   CARPAL TUNNEL RELEASE     left    CESAREAN SECTION     x 2   CHOLECYSTECTOMY     COLONOSCOPY  10/2017   DILATATION & CURETTAGE/HYSTEROSCOPY WITH MYOSURE N/A 12/10/2017   Procedure: DILATATION & CURETTAGE/HYSTEROSCOPY WITH MYOSURE;  Surgeon: Christophe Louis, MD;  Location: [facility];  Service: Gynecology;  Laterality: N/A;  POSSIBLE MYOMECTOMY VS POLYPECTOMY WITH MYOSURE   DILATION AND CURETTAGE OF UTERUS     ELBOW SURGERY     JOINT REPLACEMENT     KNEE ARTHROSCOPY     bilateral x 2    KNEE ARTHROSCOPY Left 11/03/2013   Procedure: LEFT KNEE ARTHROSCOPY WITH DEBRIDEMENT, PARTIAL SYNOVECTOMY;  Surgeon: Mcarthur Rossetti, MD;  Location: [facility];  Service: Orthopedics;  Laterality: Left;   LAPAROSCOPY     left elbow surgery      OTHER SURGICAL HISTORY     arthroswcopic surgery right knee    OTHER SURGICAL HISTORY     arthroscopic surgery left knee x 2    OTHER SURGICAL HISTORY     bunionectomy right foot    OTHER SURGICAL HISTORY     C Section x 2    OTHER SURGICAL HISTORY     carpal tunnel on left  POSTERIOR CERVICAL FUSION/FORAMINOTOMY N/A 04/02/2020   Procedure: RIGHT C5-6 FORAMINOTOMY;  Surgeon: Jessy Oto, MD;  Location: [facility];  Service: Orthopedics;  Laterality: N/A;   RADIOLOGY WITH ANESTHESIA N/A 07/28/2019   Procedure: MRI WITH ANESTHESIA OF NECK SOFT TISSUE ONLY WITH AND WITHOUT CONTRAST;  Surgeon: Radiologist, Medication, MD;  Location: [facility];  Service: Radiology;  Laterality: N/A;   RADIOLOGY WITH ANESTHESIA N/A 10/04/2019   Procedure: MRI WITH ANESTHESIA RIGHT SHOULDER WITHOUT CONTRAST,MRI OF CERVICAL SPINE WITHOUT CONTRAST;  Surgeon: Radiologist, Medication, MD;  Location: [facility];  Service: Radiology;  Laterality: N/A;   RADIOLOGY WITH ANESTHESIA N/A 08/30/2020   Procedure: MRI WITH ANESTHESIA CERVICAL SPINE WITHOUT CONTRAST;   Surgeon: Radiologist, Medication, MD;  Location: [facility];  Service: Radiology;  Laterality: N/A;   right foot bunionectomy      ROBOTIC ASSISTED TOTAL HYSTERECTOMY WITH BILATERAL SALPINGO OOPHERECTOMY Bilateral 06/16/2018   Procedure: XI ROBOTIC ASSISTED TOTAL HYSTERECTOMY WITH RIGHT SALPINGO OOPHORECTOMY;  Surgeon: Christophe Louis, MD;  Location: [facility];  Service: Gynecology;  Laterality: Bilateral;   TOTAL HIP ARTHROPLASTY  05/02/2011   Procedure: TOTAL HIP ARTHROPLASTY ANTERIOR APPROACH;  Surgeon: Mcarthur Rossetti, MD;  Location: [facility];  Service: Orthopedics;  Laterality: Left;  Left Total Hip Arthroplasty, Direct Anterior Approach   TOTAL KNEE ARTHROPLASTY Right 12/03/2012   Procedure: RIGHT TOTAL KNEE ARTHROPLASTY;  Surgeon: Mcarthur Rossetti, MD;  Location: [facility];  Service: Orthopedics;  Laterality: Right;   TOTAL KNEE ARTHROPLASTY Left 12/15/2014   Procedure: LEFT TOTAL KNEE ARTHROPLASTY;  Surgeon: Mcarthur Rossetti, MD;  Location: [facility];  Service: Orthopedics;  Laterality: Left;   TUBAL LIGATION     ULNAR NERVE TRANSPOSITION     left    UPPER GI ENDOSCOPY     x 2    Family History  Problem Relation Age of Onset   Heart disease Mother    High Cholesterol Mother    High blood pressure Mother    Depression Brother    Diabetes Brother     Social History   Socioeconomic History   Marital status: Married    Spouse name: Not on file   Number of children: Not on file   Years of education: Not on file   Highest education level: Not on file  Occupational History   Not on file  Tobacco Use   Smoking status: Every Day    Packs/day: 1.00    Years: 25.00    Pack years: 25.00    Types: Cigarettes   Smokeless tobacco: Never  Vaping Use   Vaping Use: Never used  Substance and Sexual Activity   Alcohol use: Yes    Alcohol/week: 49.0 standard drinks    Types: 49 Cans of beer per week   Drug use: No    Comment: hx of 34 years ago marijuana    Sexual  activity: Not on file  Other Topics Concern   Not on file  Social History Narrative   Not on file   Social Determinants of Health   Financial Resource Strain: Not on file  Food Insecurity: Not on file  Transportation Needs: Not on file  Physical Activity: Not on file  Stress: Not on file  Social Connections: Not on file  Intimate Partner Violence: Not on file    Outpatient Medications Prior to Visit  Medication Sig Dispense Refill   acetaminophen (TYLENOL) 500 MG tablet Take 1,000 mg by mouth every 6 (six) hours as needed for moderate  pain.     albuterol (VENTOLIN HFA) 108 (90 Base) MCG/ACT inhaler Inhale 2 puffs into the lungs every 6 (six) hours as needed for wheezing or shortness of breath. 8 g 11   fluticasone (FLOVENT HFA) 110 MCG/ACT inhaler Inhale 1 puff into the lungs in the morning and at bedtime. 11 each 2   Multiple Vitamin (MULTIVITAMIN WITH MINERALS) TABS tablet Take 2 tablets by mouth daily.     amitriptyline (ELAVIL) 10 MG tablet Take 1 tablet (10 mg total) by mouth at bedtime as needed for sleep. (Patient not taking: No sig reported) 30 tablet 3   docusate sodium (COLACE) 100 MG capsule Take 1 capsule (100 mg total) by mouth 2 (two) times daily. (Patient not taking: No sig reported) 10 capsule 0   gabapentin (NEURONTIN) 300 MG capsule Take 1 capsule (300 mg total) by mouth 3 (three) times daily. (Patient not taking: No sig reported) 90 capsule 2   HYDROcodone-acetaminophen (NORCO) 10-325 MG tablet Take 1 tablet by mouth every 4 (four) hours as needed for moderate pain ((score 4 to 6)). (Patient not taking: No sig reported) 40 tablet 0   ibuprofen (ADVIL) 800 MG tablet Take 1 tablet (800 mg total) by mouth every 6 (six) hours as needed. 60 tablet 1   methocarbamol (ROBAXIN) 500 MG tablet Take 1 tablet (500 mg total) by mouth every 6 (six) hours as needed for muscle spasms. (Patient not taking: No sig reported) 30 tablet 1   oxyCODONE-acetaminophen (PERCOCET) 5-325 MG  tablet Take 1 tablet by mouth every 4 (four) hours as needed for severe pain. 30 tablet 0   No facility-administered medications prior to visit.    Allergies  Allergen Reactions   Codeine Itching and Nausea Only    ROS Review of Systems  Constitutional:  Positive for activity change. Negative for appetite change, chills, fatigue and fever.       Patient having a hard time walking as her nerve block from the surgery she was supposed to have yesterday has not worn off yet.   HENT:  Negative for congestion, postnasal drip, rhinorrhea, sinus pressure, sinus pain, sneezing and sore throat.   Eyes: Negative.   Respiratory:  Negative for cough, chest tightness, shortness of breath and wheezing.   Cardiovascular:  Negative for chest pain and palpitations.       Irregular heart rhythm.   Gastrointestinal:  Negative for abdominal pain, constipation, diarrhea, nausea and vomiting.  Endocrine: Negative for cold intolerance, heat intolerance, polydipsia and polyuria.  Genitourinary:  Negative for dyspareunia, dysuria, flank pain, frequency and urgency.  Musculoskeletal:  Positive for arthralgias. Negative for back pain and myalgias.  Skin:  Negative for rash.  Allergic/Immunologic: Negative for environmental allergies.  Neurological:  Negative for dizziness, weakness and headaches.  Hematological:  Negative for adenopathy.  Psychiatric/Behavioral:  The patient is not nervous/anxious.      Objective:    Physical Exam Vitals and nursing note reviewed.  Constitutional:      Appearance: Normal appearance. She is well-developed. She is obese.  HENT:     Head: Normocephalic and atraumatic.     Nose: Nose normal.     Mouth/Throat:     Mouth: Mucous membranes are moist.  Eyes:     Extraocular Movements: Extraocular movements intact.     Conjunctiva/sclera: Conjunctivae normal.     Pupils: Pupils are equal, round, and reactive to light.  Cardiovascular:     Rate and Rhythm: Normal rate and  regular rhythm.  Pulses: Normal pulses.     Heart sounds: Normal heart sounds.     Comments: EKG today showing left axis deviation and left bundle branch block.  Pulmonary:     Effort: Pulmonary effort is normal.     Breath sounds: Normal breath sounds.  Abdominal:     Palpations: Abdomen is soft.  Musculoskeletal:        General: Swelling and tenderness present. Normal range of motion.     Cervical back: Normal range of motion and neck supple.     Comments: Left foot bunion.   Lymphadenopathy:     Cervical: No cervical adenopathy.  Skin:    General: Skin is warm and dry.     Capillary Refill: Capillary refill takes less than 2 seconds.  Neurological:     General: No focal deficit present.     Mental Status: She is alert and oriented to person, place, and time.  Psychiatric:        Mood and Affect: Mood normal.        Behavior: Behavior normal.        Thought Content: Thought content normal.        Judgment: Judgment normal.    Today's Vitals   02/19/21 1327  BP: (!) 144/82  Pulse: 95  Temp: 97.7 F (36.5 C)  SpO2: 96%  Weight: 220 lb (99.8 kg)  Height: 5\' 7"  (1.702 m)   Body mass index is 34.46 kg/m.   Wt Readings from Last 3 Encounters:  02/19/21 220 lb (99.8 kg)  12/05/20 219 lb 6.4 oz (99.5 kg)  09/10/20 215 lb (97.5 kg)     Health Maintenance Due  Topic Date Due   Pneumonia Vaccine 72+ Years old (1 - PCV) Never done   Hepatitis C Screening  Never done   COLONOSCOPY (Pts 45-14yrs Insurance coverage will need to be confirmed)  Never done   Zoster Vaccines- Shingrix (1 of 2) Never done   MAMMOGRAM  02/13/2017   COVID-19 Vaccine (3 - Booster for Moderna series) 06/21/2020   PAP SMEAR-Modifier  09/25/2020   INFLUENZA VACCINE  11/05/2020   DEXA SCAN  Never done    There are no preventive care reminders to display for this patient.  Lab Results  Component Value Date   TSH 0.388 06/29/2010   Lab Results  Component Value Date   WBC 9.4 03/29/2020    HGB 15.2 (H) 03/29/2020   HCT 46.6 (H) 03/29/2020   MCV 96.5 03/29/2020   PLT 280 03/29/2020   Lab Results  Component Value Date   NA 141 03/29/2020   K 3.5 03/29/2020   CO2 25 03/29/2020   GLUCOSE 108 (H) 03/29/2020   BUN 10 03/29/2020   CREATININE 0.79 03/29/2020   BILITOT 0.7 04/23/2014   ALKPHOS 107 04/23/2014   AST 26 04/23/2014   ALT 22 04/23/2014   PROT 6.8 04/23/2014   ALBUMIN 3.7 04/23/2014   CALCIUM 8.9 03/29/2020   ANIONGAP 10 03/29/2020   No results found for: CHOL No results found for: HDL No results found for: LDLCALC No results found for: TRIG No results found for: CHOLHDL No results found for: HGBA1C    Assessment & Plan:  1. Left bundle branch block ECG showing normal sinus rhythm with left bundle branch block. This is uncharged from previous ECGs. Patient has never been evaluated by cardiology. A referral to cardiology was made today for further evaluation.  - Ambulatory referral to Cardiology - EKG 12-Lead  2. Left axis  deviation Left axis deviation present on today's ECG. This is unchanged from recent ECG exams. Patient has never been evaluated per cardiology. Referral to cardiology made today for further evaluation.  - Ambulatory referral to Cardiology  3. Acquired hallux varus of left foot Patient scheduled for surgical removal of left bunion yesterday. She will need clearance from cardiology and new appointment with podiatry to have new date set up for surgery.    Problem List Items Addressed This Visit       Cardiovascular and Mediastinum   Left bundle branch block - Primary   Relevant Orders   Ambulatory referral to Cardiology   EKG 12-Lead (Completed)     Musculoskeletal and Integument   Acquired hallux varus of left foot     Other   Left axis deviation   Relevant Orders   Ambulatory referral to Cardiology     Follow-up: Return in about 4 weeks (around 03/19/2021) for medicare wellness - fbw with Free T4 and vitamin d in next  week .    Ronnell Freshwater, NP

## 2021-02-20 DIAGNOSIS — M2032 Hallux varus (acquired), left foot: Secondary | ICD-10-CM | POA: Insufficient documentation

## 2021-02-20 DIAGNOSIS — R9431 Abnormal electrocardiogram [ECG] [EKG]: Secondary | ICD-10-CM | POA: Insufficient documentation

## 2021-02-20 DIAGNOSIS — I447 Left bundle-branch block, unspecified: Secondary | ICD-10-CM | POA: Insufficient documentation

## 2021-02-27 ENCOUNTER — Other Ambulatory Visit: Payer: Self-pay

## 2021-02-27 ENCOUNTER — Encounter: Payer: Medicare Other | Admitting: Podiatry

## 2021-02-27 DIAGNOSIS — Z130 Encounter for screening for diseases of the blood and blood-forming organs and certain disorders involving the immune mechanism: Secondary | ICD-10-CM

## 2021-02-27 DIAGNOSIS — Z0000 Encounter for general adult medical examination without abnormal findings: Secondary | ICD-10-CM

## 2021-02-27 DIAGNOSIS — R5383 Other fatigue: Secondary | ICD-10-CM

## 2021-02-27 DIAGNOSIS — Z1321 Encounter for screening for nutritional disorder: Secondary | ICD-10-CM

## 2021-03-04 ENCOUNTER — Other Ambulatory Visit: Payer: Self-pay

## 2021-03-04 ENCOUNTER — Other Ambulatory Visit: Payer: Medicare Other

## 2021-03-04 DIAGNOSIS — Z130 Encounter for screening for diseases of the blood and blood-forming organs and certain disorders involving the immune mechanism: Secondary | ICD-10-CM | POA: Diagnosis not present

## 2021-03-04 DIAGNOSIS — Z0000 Encounter for general adult medical examination without abnormal findings: Secondary | ICD-10-CM

## 2021-03-04 DIAGNOSIS — Z13228 Encounter for screening for other metabolic disorders: Secondary | ICD-10-CM | POA: Diagnosis not present

## 2021-03-04 DIAGNOSIS — Z1329 Encounter for screening for other suspected endocrine disorder: Secondary | ICD-10-CM | POA: Diagnosis not present

## 2021-03-04 DIAGNOSIS — Z1321 Encounter for screening for nutritional disorder: Secondary | ICD-10-CM | POA: Diagnosis not present

## 2021-03-04 DIAGNOSIS — R5383 Other fatigue: Secondary | ICD-10-CM

## 2021-03-05 LAB — COMPREHENSIVE METABOLIC PANEL
ALT: 16 IU/L (ref 0–32)
AST: 19 IU/L (ref 0–40)
Albumin/Globulin Ratio: 1.5 (ref 1.2–2.2)
Albumin: 4.2 g/dL (ref 3.8–4.8)
Alkaline Phosphatase: 107 IU/L (ref 44–121)
BUN/Creatinine Ratio: 13 (ref 12–28)
BUN: 10 mg/dL (ref 8–27)
Bilirubin Total: 0.6 mg/dL (ref 0.0–1.2)
CO2: 22 mmol/L (ref 20–29)
Calcium: 9.3 mg/dL (ref 8.7–10.3)
Chloride: 103 mmol/L (ref 96–106)
Creatinine, Ser: 0.8 mg/dL (ref 0.57–1.00)
Globulin, Total: 2.8 g/dL (ref 1.5–4.5)
Glucose: 98 mg/dL (ref 70–99)
Potassium: 4.1 mmol/L (ref 3.5–5.2)
Sodium: 143 mmol/L (ref 134–144)
Total Protein: 7 g/dL (ref 6.0–8.5)
eGFR: 82 mL/min/{1.73_m2} (ref >59)

## 2021-03-05 LAB — CBC WITH DIFFERENTIAL/PLATELET
Basophils Absolute: 0.1 10*3/uL (ref 0.0–0.2)
Basos: 1 %
EOS (ABSOLUTE): 0.5 10*3/uL — ABNORMAL HIGH (ref 0.0–0.4)
Eos: 4 %
Hematocrit: 45.4 % (ref 34.0–46.6)
Hemoglobin: 15.7 g/dL (ref 11.1–15.9)
Immature Grans (Abs): 0 10*3/uL (ref 0.0–0.1)
Immature Granulocytes: 0 %
Lymphocytes Absolute: 3.5 10*3/uL — ABNORMAL HIGH (ref 0.7–3.1)
Lymphs: 30 %
MCH: 32.8 pg (ref 26.6–33.0)
MCHC: 34.6 g/dL (ref 31.5–35.7)
MCV: 95 fL (ref 79–97)
Monocytes Absolute: 0.8 10*3/uL (ref 0.1–0.9)
Monocytes: 7 %
Neutrophils Absolute: 6.6 10*3/uL (ref 1.4–7.0)
Neutrophils: 58 %
Platelets: 262 10*3/uL (ref 150–450)
RBC: 4.79 x10E6/uL (ref 3.77–5.28)
RDW: 13.1 % (ref 11.7–15.4)
WBC: 11.5 10*3/uL — ABNORMAL HIGH (ref 3.4–10.8)

## 2021-03-05 LAB — LIPID PANEL
Chol/HDL Ratio: 4.9 ratio — ABNORMAL HIGH (ref 0.0–4.4)
Cholesterol, Total: 250 mg/dL — ABNORMAL HIGH (ref 100–199)
HDL: 51 mg/dL (ref >39)
LDL Chol Calc (NIH): 162 mg/dL — ABNORMAL HIGH (ref 0–99)
Triglycerides: 201 mg/dL — ABNORMAL HIGH (ref 0–149)
VLDL Cholesterol Cal: 37 mg/dL (ref 5–40)

## 2021-03-05 LAB — HEMOGLOBIN A1C
Est. average glucose Bld gHb Est-mCnc: 111 mg/dL
Hgb A1c MFr Bld: 5.5 % (ref 4.8–5.6)

## 2021-03-05 LAB — TSH: TSH: 1.11 u[IU]/mL (ref 0.450–4.500)

## 2021-03-05 LAB — T4, FREE: Free T4: 1.07 ng/dL (ref 0.82–1.77)

## 2021-03-05 LAB — VITAMIN D 25 HYDROXY (VIT D DEFICIENCY, FRACTURES): Vit D, 25-Hydroxy: 31.9 ng/mL (ref 30.0–100.0)

## 2021-03-07 ENCOUNTER — Ambulatory Visit (INDEPENDENT_AMBULATORY_CARE_PROVIDER_SITE_OTHER): Payer: Medicare Other | Admitting: Nurse Practitioner

## 2021-03-07 ENCOUNTER — Encounter: Payer: Self-pay | Admitting: Nurse Practitioner

## 2021-03-07 VITALS — Ht 67.0 in | Wt 215.0 lb

## 2021-03-07 DIAGNOSIS — E782 Mixed hyperlipidemia: Secondary | ICD-10-CM

## 2021-03-07 DIAGNOSIS — Z0000 Encounter for general adult medical examination without abnormal findings: Secondary | ICD-10-CM

## 2021-03-07 DIAGNOSIS — D72829 Elevated white blood cell count, unspecified: Secondary | ICD-10-CM | POA: Diagnosis not present

## 2021-03-07 DIAGNOSIS — Z6834 Body mass index (BMI) 34.0-34.9, adult: Secondary | ICD-10-CM | POA: Diagnosis not present

## 2021-03-07 DIAGNOSIS — E785 Hyperlipidemia, unspecified: Secondary | ICD-10-CM | POA: Insufficient documentation

## 2021-03-07 MED ORDER — AMOXICILLIN 875 MG PO TABS: 875.0000 mg | ORAL_TABLET | Freq: Two times a day (BID) | ORAL | 0 refills | Status: DC

## 2021-03-07 NOTE — Patient Instructions (Signed)
Preventive Care 92-65 Years Old, Female Preventive care refers to lifestyle choices and visits with your health care provider that can promote health and wellness. Preventive care visits are also called wellness exams. What can I expect for my preventive care visit? Counseling Your health care provider may ask you questions about your: Medical history, including: Past medical problems. Family medical history. Pregnancy history. Current health, including: Menstrual cycle. Method of birth control. Emotional well-being. Home life and relationship well-being. Sexual activity and sexual health. Lifestyle, including: Alcohol, nicotine or tobacco, and drug use. Access to firearms. Diet, exercise, and sleep habits. Work and work Statistician. Sunscreen use. Safety issues such as seatbelt and bike helmet use. Physical exam Your health care provider will check your: Height and weight. These may be used to calculate your BMI (body mass index). BMI is a measurement that tells if you are at a healthy weight. Waist circumference. This measures the distance around your waistline. This measurement also tells if you are at a healthy weight and may help predict your risk of certain diseases, such as type 2 diabetes and high blood pressure. Heart rate and blood pressure. Body temperature. Skin for abnormal spots. What immunizations do I need? Vaccines are usually given at various ages, according to a schedule. Your health care provider will recommend vaccines for you based on your age, medical history, and lifestyle or other factors, such as travel or where you work. What tests do I need? Screening Your health care provider may recommend screening tests for certain conditions. This may include: Lipid and cholesterol levels. Diabetes screening. This is done by checking your blood sugar (glucose) after you have not eaten for a while (fasting). Pelvic exam and Pap test. Hepatitis B test. Hepatitis C  test. HIV (human immunodeficiency virus) test. STI (sexually transmitted infection) testing, if you are at risk. Lung cancer screening. Colorectal cancer screening. Mammogram. Talk with your health care provider about when you should start having regular mammograms. This may depend on whether you have a family history of breast cancer. BRCA-related cancer screening. This may be done if you have a family history of breast, ovarian, tubal, or peritoneal cancers. Bone density scan. This is done to screen for osteoporosis. Talk with your health care provider about your test results, treatment options, and if necessary, the need for more tests. Follow these instructions at home: Eating and drinking  Eat a diet that includes fresh fruits and vegetables, whole grains, lean protein, and low-fat dairy products. Take vitamin and mineral supplements as recommended by your health care provider. Do not drink alcohol if: Your health care provider tells you not to drink. You are pregnant, may be pregnant, or are planning to become pregnant. If you drink alcohol: Limit how much you have to 0-1 drink a day. Know how much alcohol is in your drink. In the [place] one drink equals one 12 oz bottle of beer (355 mL), one 5 oz glass of wine (148 mL), or one 1 oz glass of hard liquor (44 mL). Lifestyle Brush your teeth every morning and night with fluoride toothpaste. Floss one time each day. Exercise for at least 30 minutes 5 or more days each week. Do not use any products that contain nicotine or tobacco. These products include cigarettes, chewing tobacco, and vaping devices, such as e-cigarettes. If you need help quitting, ask your health care provider. Do not use drugs. If you are sexually active, practice safe sex. Use a condom or other form of protection to prevent  STIs. If you do not wish to become pregnant, use a form of birth control. If you plan to become pregnant, see your health care provider for a  prepregnancy visit. Take aspirin only as told by your health care provider. Make sure that you understand how much to take and what form to take. Work with your health care provider to find out whether it is safe and beneficial for you to take aspirin daily. Find healthy ways to manage stress, such as: Meditation, yoga, or listening to music. Journaling. Talking to a trusted person. Spending time with friends and family. Minimize exposure to UV radiation to reduce your risk of skin cancer. Safety Always wear your seat belt while driving or riding in a vehicle. Do not drive: If you have been drinking alcohol. Do not ride with someone who has been drinking. When you are tired or distracted. While texting. If you have been using any mind-altering substances or drugs. Wear a helmet and other protective equipment during sports activities. If you have firearms in your house, make sure you follow all gun safety procedures. Seek help if you have been physically or sexually abused. What's next? Visit your health care provider once a year for an annual wellness visit. Ask your health care provider how often you should have your eyes and teeth checked. Stay up to date on all vaccines. This information is not intended to replace advice given to you by your health care provider. Make sure you discuss any questions you have with your health care provider. Document Revised: 09/19/2020 Document Reviewed: 09/19/2020 Elsevier Patient Education  [employer].

## 2021-03-07 NOTE — Progress Notes (Signed)
Subjective:   Katrina [name] is a 65 y.o. female who presents for Medicare Annual (Subsequent) preventive examination.  Review of Systems    Refer to PCP   I connected with  Arnoldo Morale on 03/07/21 by an audio only telemedicine application and verified that I am speaking with the correct person using two identifiers.   I discussed the limitations, risks, security and privacy concerns of performing an evaluation and management service by telephone and the availability of in person appointments. I also discussed with the patient that there may be a patient responsible charge related to this service. The patient expressed understanding and verbally consented to this telephonic visit.  Location of Patient: Home Location of Provider: Office    List any persons and their role that are participating in the visit with the patient.      Objective:    Today's Vitals   03/07/21 1455  Weight: 215 lb (97.5 kg)  Height: 5\' 7"  (1.702 m)   Body mass index is 33.67 kg/m.  Advanced Directives 09/26/2020 03/29/2020 10/04/2019 07/28/2019 06/16/2018 06/10/2018 12/10/2017  Does Patient Have a Medical Advance Directive? No No No No No No No  Would patient like information on creating a medical advance directive? - - No - Patient declined No - Patient declined No - Patient declined No - Patient declined No - Patient declined  Pre-existing out of facility DNR order (yellow form or pink MOST form) - - - - - - -    Current Medications (verified) Outpatient Encounter Medications as of 03/07/2021  Medication Sig   acetaminophen (TYLENOL) 500 MG tablet Take 1,000 mg by mouth every 6 (six) hours as needed for moderate pain.   albuterol (VENTOLIN HFA) 108 (90 Base) MCG/ACT inhaler Inhale 2 puffs into the lungs every 6 (six) hours as needed for wheezing or shortness of breath.   amoxicillin (AMOXIL) 875 MG tablet Take 1 tablet (875 mg total) by mouth 2 (two) times daily.   fluticasone (FLOVENT HFA) 110 MCG/ACT  inhaler Inhale 1 puff into the lungs in the morning and at bedtime.   Multiple Vitamin (MULTIVITAMIN WITH MINERALS) TABS tablet Take 2 tablets by mouth daily.   [DISCONTINUED] clonazePAM (KLONOPIN) 0.5 MG tablet Take 1 tablet (0.5 mg total) by mouth 2 (two) times daily as needed for anxiety.   No facility-administered encounter medications on file as of 03/07/2021.    Allergies (verified) Codeine   History: Past Medical History:  Diagnosis Date   Anxiety    Arthritis    knees, hips, elbows    Asthma    hx of asthma 3/12- hospitalized    COPD (chronic obstructive pulmonary disease) (HCC)    Depression    GERD (gastroesophageal reflux disease)    PRIOR TO HAVING GALLBLADDER REMOVED   Headache    from her neck   Left bundle branch block 06/28/2010   Lung infection 2015   Lung infection    Past Surgical History:  Procedure Laterality Date   BACK SURGERY     x2   CARPAL TUNNEL RELEASE     left    CESAREAN SECTION     x 2   CHOLECYSTECTOMY     COLONOSCOPY  10/2017   DILATATION & CURETTAGE/HYSTEROSCOPY WITH MYOSURE N/A 12/10/2017   Procedure: DILATATION & CURETTAGE/HYSTEROSCOPY WITH MYOSURE;  Surgeon: Christophe Louis, MD;  Location: [facility];  Service: Gynecology;  Laterality: N/A;  POSSIBLE MYOMECTOMY VS POLYPECTOMY WITH MYOSURE   DILATION AND CURETTAGE OF UTERUS  ELBOW SURGERY     JOINT REPLACEMENT     KNEE ARTHROSCOPY     bilateral x 2    KNEE ARTHROSCOPY Left 11/03/2013   Procedure: LEFT KNEE ARTHROSCOPY WITH DEBRIDEMENT, PARTIAL SYNOVECTOMY;  Surgeon: Mcarthur Rossetti, MD;  Location: [facility];  Service: Orthopedics;  Laterality: Left;   LAPAROSCOPY     left elbow surgery      OTHER SURGICAL HISTORY     arthroswcopic surgery right knee    OTHER SURGICAL HISTORY     arthroscopic surgery left knee x 2    OTHER SURGICAL HISTORY     bunionectomy right foot    OTHER SURGICAL HISTORY     C Section x 2    OTHER SURGICAL HISTORY     carpal tunnel on left    POSTERIOR  CERVICAL FUSION/FORAMINOTOMY N/A 04/02/2020   Procedure: RIGHT C5-6 FORAMINOTOMY;  Surgeon: Jessy Oto, MD;  Location: [facility];  Service: Orthopedics;  Laterality: N/A;   RADIOLOGY WITH ANESTHESIA N/A 07/28/2019   Procedure: MRI WITH ANESTHESIA OF NECK SOFT TISSUE ONLY WITH AND WITHOUT CONTRAST;  Surgeon: Radiologist, Medication, MD;  Location: [facility];  Service: Radiology;  Laterality: N/A;   RADIOLOGY WITH ANESTHESIA N/A 10/04/2019   Procedure: MRI WITH ANESTHESIA RIGHT SHOULDER WITHOUT CONTRAST,MRI OF CERVICAL SPINE WITHOUT CONTRAST;  Surgeon: Radiologist, Medication, MD;  Location: [facility];  Service: Radiology;  Laterality: N/A;   RADIOLOGY WITH ANESTHESIA N/A 08/30/2020   Procedure: MRI WITH ANESTHESIA CERVICAL SPINE WITHOUT CONTRAST;  Surgeon: Radiologist, Medication, MD;  Location: [facility];  Service: Radiology;  Laterality: N/A;   right foot bunionectomy      ROBOTIC ASSISTED TOTAL HYSTERECTOMY WITH BILATERAL SALPINGO OOPHERECTOMY Bilateral 06/16/2018   Procedure: XI ROBOTIC ASSISTED TOTAL HYSTERECTOMY WITH RIGHT SALPINGO OOPHORECTOMY;  Surgeon: Christophe Louis, MD;  Location: [facility];  Service: Gynecology;  Laterality: Bilateral;   TOTAL HIP ARTHROPLASTY  05/02/2011   Procedure: TOTAL HIP ARTHROPLASTY ANTERIOR APPROACH;  Surgeon: Mcarthur Rossetti, MD;  Location: [facility];  Service: Orthopedics;  Laterality: Left;  Left Total Hip Arthroplasty, Direct Anterior Approach   TOTAL KNEE ARTHROPLASTY Right 12/03/2012   Procedure: RIGHT TOTAL KNEE ARTHROPLASTY;  Surgeon: Mcarthur Rossetti, MD;  Location: [facility];  Service: Orthopedics;  Laterality: Right;   TOTAL KNEE ARTHROPLASTY Left 12/15/2014   Procedure: LEFT TOTAL KNEE ARTHROPLASTY;  Surgeon: Mcarthur Rossetti, MD;  Location: [facility];  Service: Orthopedics;  Laterality: Left;   TUBAL LIGATION     ULNAR NERVE TRANSPOSITION     left    UPPER GI ENDOSCOPY     x 2   Family History  Problem Relation Age of Onset   Heart disease  Mother    High Cholesterol Mother    High blood pressure Mother    Depression Brother    Diabetes Brother    Social History   Socioeconomic History   Marital status: Married    Spouse name: [name]   Number of children: 2   Years of education: GED   Highest education level: Not on file  Occupational History   Occupation: disabled  Tobacco Use   Smoking status: Every Day    Packs/day: 1.00    Years: 25.00    Pack years: 25.00    Types: Cigarettes   Smokeless tobacco: Never  Vaping Use   Vaping Use: Never used  Substance and Sexual Activity   Alcohol use: Yes    Alcohol/week: 49.0 standard drinks  Types: 8 Cans of beer per week   Drug use: No    Comment: hx of 34 years ago marijuana    Sexual activity: Not Currently  Other Topics Concern   Not on file  Social History Narrative   Not on file   Social Determinants of Health   Financial Resource Strain: Medium Risk   Difficulty of Paying Living Expenses: Somewhat hard  Food Insecurity: No Food Insecurity   Worried About Running Out of Food in the Last Year: Never true   Ran Out of Food in the Last Year: Never true  Transportation Needs: No Transportation Needs   Lack of Transportation (Medical): No   Lack of Transportation (Non-Medical): No  Physical Activity: Insufficiently Active   Days of Exercise per Week: 2 days   Minutes of Exercise per Session: 30 min  Stress: Stress Concern Present   Feeling of Stress : Very much  Social Connections: Moderately Isolated   Frequency of Communication with Friends and Family: More than three times a week   Frequency of Social Gatherings with Friends and Family: More than three times a week   Attends Religious Services: Never   Marine scientist or Organizations: No   Attends Archivist Meetings: Never   Marital Status: Married    Tobacco Counseling Ready to quit: Not Answered Counseling given: Not Answered   Diabetic?No     Activities of Daily  Living In your present state of health, do you have any difficulty performing the following activities: 03/07/2021 12/05/2020  Hearing? N N  Vision? N Y  Difficulty concentrating or making decisions? N N  Walking or climbing stairs? N Y  Dressing or bathing? N N  Doing errands, shopping? N N  Some recent data might be hidden    Patient Care Team: Ronnell Freshwater, NP as PCP - General (Family Medicine)  Indicate any recent Medical Services you may have received from other than [facility] providers in the past year (date may be approximate).     Assessment:  1. Encounter for Medicare annual wellness exam Annual wellness visit today   2. Leukocytosis, unspecified type Reviewed labs with patient. WBC was 11.5. patient reporting no symptoms associated with infection. Will do round amoxicillin 875mg  twice daily for 5 days. Will recheck CBC in 2 weeks. Will notify patient of results when theyare available.  - amoxicillin (AMOXIL) 875 MG tablet; Take 1 tablet (875 mg total) by mouth 2 (two) times daily.  Dispense: 14 tablet; Refill: 0  3. Mixed hyperlipidemia Lipid panel showing generalized elevation.  Discussed limiting intake of fried and fatty foods and increasing green leafy vegetables and healthy proteins.  I also recommend increasing exercise to help with weight loss and lower cholesterol.  We will recheck in 6 months.  4. Body mass index (BMI) of 34.0-34.9 in adult Discussed lowering calorie intake to 1500 calories per day and incorporating exercise into daily routine to help lose weight. Will monitor.    Hearing/Vision screen No results found.   Depression Screen PHQ 2/9 Scores 03/07/2021 12/05/2020  PHQ - 2 Score 2 2  PHQ- 9 Score 5 10    Fall Risk Fall Risk  03/07/2021 12/05/2020  Falls in the past year? 0 0  Number falls in past yr: 0 0  Injury with Fall? 0 0  Risk for fall due to : History of fall(s) -  Follow up Falls evaluation completed Falls evaluation completed    FALL  RISK PREVENTION PERTAINING TO  THE HOME:  Any stairs in or around the home? Yes  If so, are there any without handrails? No  Home free of loose throw rugs in walkways, pet beds, electrical cords, etc? Yes  Adequate lighting in your home to reduce risk of falls? Yes   ASSISTIVE DEVICES UTILIZED TO PREVENT FALLS:  Life alert? No  Use of a cane, walker or w/c? No  Grab bars in the bathroom? Yes  Shower chair or bench in shower? Yes Elevated toilet seat or a handicapped toilet? Yes   TIMED UP AND GO:  Was the test performed? No .  Length of time to ambulate 10 feet:  sec.   Telehealth  Cognitive Function:     6CIT Screen 03/07/2021  What Year? 0 points  What month? 0 points  What time? 0 points  Count back from 20 0 points  Months in reverse 2 points  Repeat phrase 0 points  Total Score 2    Immunizations Immunization History  Administered Date(s) Administered   Influenza, Quadrivalent, Recombinant, Inj, Pf 03/12/2018   Moderna SARS-COV2 Booster Vaccination 04/26/2020   Moderna Sars-Covid-2 Vaccination 09/10/2019, 10/19/2019   Tdap 04/21/2014    TDAP status: Up to date  Flu Vaccine status: Up to date  Pneumococcal vaccine status: Due, Education has been provided regarding the importance of this vaccine. Advised may receive this vaccine at local pharmacy or Health Dept. Aware to provide a copy of the vaccination record if obtained from local pharmacy or Health Dept. Verbalized acceptance and understanding.  Covid-19 vaccine status: Completed vaccines  Qualifies for Shingles Vaccine? Yes   Zostavax completed No   Shingrix Completed?: No.    Education has been provided regarding the importance of this vaccine. Patient has been advised to call insurance company to determine out of pocket expense if they have not yet received this vaccine. Advised may also receive vaccine at local pharmacy or Health Dept. Verbalized acceptance and understanding.  Screening Tests Health  Maintenance  Topic Date Due   Pneumonia Vaccine 25+ Years old (1 - PCV) Never done   Hepatitis C Screening  Never done   COLONOSCOPY (Pts 45-41yrs Insurance coverage will need to be confirmed)  Never done   Zoster Vaccines- Shingrix (1 of 2) Never done   MAMMOGRAM  02/13/2017   COVID-19 Vaccine (3 - Booster for Moderna series) 06/21/2020   PAP SMEAR-Modifier  09/25/2020   INFLUENZA VACCINE  11/05/2020   DEXA SCAN  Never done   TETANUS/TDAP  04/21/2024   HIV Screening  Completed   HPV VACCINES  Aged Out    Health Maintenance  Health Maintenance Due  Topic Date Due   Pneumonia Vaccine 17+ Years old (1 - PCV) Never done   Hepatitis C Screening  Never done   COLONOSCOPY (Pts 45-34yrs Insurance coverage will need to be confirmed)  Never done   Zoster Vaccines- Shingrix (1 of 2) Never done   MAMMOGRAM  02/13/2017   COVID-19 Vaccine (3 - Booster for Moderna series) 06/21/2020   PAP SMEAR-Modifier  09/25/2020   INFLUENZA VACCINE  11/05/2020   DEXA SCAN  Never done    Colorectal cancer screening: Referral to GI placed today. Pt aware the office will call re: appt.  Mammogram status: Ordered today. Pt provided with contact info and advised to call to schedule appt.   Bone Density status: Ordered today. Pt provided with contact info and advised to call to schedule appt.  Lung Cancer Screening: (Low Dose CT Chest recommended if  Age 35-80 years, 30 pack-year currently smoking OR have quit w/in 15years.) does qualify.   Lung Cancer Screening Referral: Recently had it done  Additional Screening:  Hepatitis C Screening: does qualify; Patient declined  Vision Screening: Recommended annual ophthalmology exams for early detection of glaucoma and other disorders of the eye. Is the patient up to date with their annual eye exam?  Yes  Who is the provider or what is the name of the office in which the patient attends annual eye exams? [facility] If pt is not established with a provider,  would they like to be referred to a provider to establish care? No .   Dental Screening: Recommended annual dental exams for proper oral hygiene  Community Resource Referral / Chronic Care Management: CRR required this visit?  No   CCM required this visit?  No      Plan:     I have personally reviewed and noted the following in the patient's chart:   Medical and social history Use of alcohol, tobacco or illicit drugs  Current medications and supplements including opioid prescriptions.  Functional ability and status Nutritional status Physical activity Advanced directives List of other physicians Hospitalizations, surgeries, and ER visits in previous 12 months Vitals Screenings to include cognitive, depression, and falls Referrals and appointments  In addition, I have reviewed and discussed with patient certain preventive protocols, quality metrics, and best practice recommendations. A written personalized care plan for preventive services as well as general preventive health recommendations were provided to patient.     Ronnell Freshwater, NP   03/07/2021   Nurse Notes: Patient would like to discuss lab results recently obtained. AS, CMA

## 2021-03-11 NOTE — Progress Notes (Signed)
Labs discussed during recent visit. A low fat, low cholesterol is discussed with the patient, and a written information provided. Recheck in one year. Treatment with amoxicillin twice daily and recheck of cbc in 2 weeks.

## 2021-03-13 ENCOUNTER — Encounter: Payer: Medicare Other | Admitting: Podiatry

## 2021-03-18 NOTE — Progress Notes (Signed)
Patient Cardiology Office Note:    Date:  03/21/2021   ID:  Katrina [name], DOB [birth year], MRN [id number]  PCP:  Ronnell Freshwater, NP   [facility] Providers Cardiologist:  Lenna Sciara, MD Referring MD: Ronnell Freshwater, NP   Chief Complaint/Reason for Referral: Left bundle branch block and preoperative evaluation  ASSESSMENT & PLAN:    Left bundle branch block The patient has a chronic left bundle branch block that has been present on several EKGs.  This in and of itself is not a contraindication to surgery.  She has no exertional anginal symptoms and can do at least 4 METS of activity.  For this reason she is low risk for any perioperative cardiovascular complications.  Additionally she underwent cervical: Spine surgery with general anesthesia last year without any issues.  I will obtain an echocardiogram to evaluate her ejection fraction.  I will keep follow-up with me open-ended.  Preoperative cardiovascular examination See discussion above.  Aortic atherosclerosis (HCC) We will start atorvastatin 40 mg and aspirin 81 mg.  Goal LDL <70; no coronary calcification on CT seen.          Dispo:  No follow-ups on file.     Medication Adjustments/Labs and Tests Ordered: Current medicines are reviewed at length with the patient today.  Concerns regarding medicines are outlined above.   Tests Ordered: Orders Placed This Encounter  Procedures   ECHOCARDIOGRAM COMPLETE     Medication Changes: Meds ordered this encounter  Medications   aspirin EC 81 MG tablet    Sig: Take 1 tablet (81 mg total) by mouth daily. Swallow whole.    Dispense:  90 tablet    Refill:  3   atorvastatin (LIPITOR) 40 MG tablet    Sig: Take 1 tablet (40 mg total) by mouth daily.    Dispense:  90 tablet    Refill:  3     History of Present Illness:    The patient is a 65 y.o. female with the indicated medical history here for recommendations regarding her her chronic left bundle branch  block.  The patient had been seen by her primary care provider in August to establish care.  At that point in time she had issues with a callus on the bottom of her left foot when she would ambulate she would have a lot of pain associated with this.  She denies at that time any chest pain or pressure.  She does have did report some shortness of breath. The patient was seen by her primary care provider recently.  She was to have a callus removed but this was aborted due to reports of chest fluttering and an irregular heartbeat immediately before her surgery.  Her EKG at her PCPs office demonstrated a left bundle branch block and it was noted that this was a chronic finding over the last several years.  The patient was well and denied any major cardiovascular complaints.  She did report palpitations around the time of her surgery that again was aborted.  The patient tells me that she is able to do all of her activities of daily living without any issues.  She is able to mow her lawn and rake leaves, vacuum, and clean her house.  She grocery shops without any issues.  She denies any exertional angina or shortness of breath.  She has had no palpitations paroxysmal nocturnal dyspnea, orthopnea.  She has required no emergency room visits or hospitalizations.     Previous Medical  History: Past Medical History:  Diagnosis Date   Anxiety    Arthritis    knees, hips, elbows    Asthma    hx of asthma 3/12- hospitalized    COPD (chronic obstructive pulmonary disease) (HCC)    Depression    GERD (gastroesophageal reflux disease)    PRIOR TO HAVING GALLBLADDER REMOVED   Headache    from her neck   Left bundle branch block 06/28/2010   Lung infection 2015   Lung infection     -Aortic atherosclerosis with no coronary calcification on chest CT 2022   Current Medications: Current Meds  Medication Sig   acetaminophen (TYLENOL) 500 MG tablet Take 1,000 mg by mouth every 6 (six) hours as needed for moderate  pain.   albuterol (VENTOLIN HFA) 108 (90 Base) MCG/ACT inhaler Inhale 2 puffs into the lungs every 6 (six) hours as needed for wheezing or shortness of breath.   aspirin EC 81 MG tablet Take 1 tablet (81 mg total) by mouth daily. Swallow whole.   atorvastatin (LIPITOR) 40 MG tablet Take 1 tablet (40 mg total) by mouth daily.   fluticasone (FLOVENT HFA) 110 MCG/ACT inhaler Inhale 1 puff into the lungs in the morning and at bedtime.   Multiple Vitamin (MULTIVITAMIN WITH MINERALS) TABS tablet Take 2 tablets by mouth daily.     Allergies:    Codeine   Social History:   Social History   Tobacco Use   Smoking status: Every Day    Packs/day: 1.00    Years: 25.00    Pack years: 25.00    Types: Cigarettes   Smokeless tobacco: Never  Vaping Use   Vaping Use: Never used  Substance Use Topics   Alcohol use: Yes    Alcohol/week: 49.0 standard drinks    Types: 49 Cans of beer per week   Drug use: No    Comment: hx of 34 years ago marijuana      Family Hx: Family History  Problem Relation Age of Onset   Heart disease Mother    High Cholesterol Mother    High blood pressure Mother    Depression Brother    Diabetes Brother      Review of Systems:   Please see the history of present illness.    All other systems reviewed and are negative.  EKGs/Labs/Other Test Reviewed:    EKG:  prior EKG: Sinus rhythm with left bundle branch block  Prior CV studies: N/A  Imaging studies that I have independently reviewed today:   CT September 2022 demonstrates aortic atherosclerosis without coronary calcification  Recent Labs: 03/04/2021: ALT 16; BUN 10; Creatinine, Ser 0.80; Hemoglobin 15.7; Platelets 262; Potassium 4.1; Sodium 143; TSH 1.110   Recent Lipid Panel Lab Results  Component Value Date/Time   CHOL 250 (H) 03/04/2021 08:21 AM   TRIG 201 (H) 03/04/2021 08:21 AM   HDL 51 03/04/2021 08:21 AM   LDLCALC 162 (H) 03/04/2021 08:21 AM    Risk Assessment/Calculations:           Physical Exam:    VS:  BP 128/70   Pulse 72   Ht 5\' 7"  (1.702 m)   Wt 215 lb (97.5 kg)   SpO2 97%   BMI 33.67 kg/m    Wt Readings from Last 3 Encounters:  03/21/21 215 lb (97.5 kg)  03/07/21 215 lb (97.5 kg)  02/19/21 220 lb (99.8 kg)    GENERAL:  No apparent distress, AOx3 HEENT:  No carotid bruits, +2 carotid  impulses, no scleral icterus CAR: RRR, no murmurs, gallops, rubs, or thrills RES:  Clear to auscultation bilaterally ABD:  Soft, nontender, nondistended, positive bowel sounds x 4 VASC:  +2 radial pulses, +2 carotid pulses, palpable pedal pulses NEURO:  CN 2-12 grossly intact; motor and sensory grossly intact PSYCH:  No active depression or anxiety EXT:  No edema, ecchymosis, or cyanosis  Signed, Early Osmond, MD  03/21/2021 10:32 AM    [address], [place], [place]  [address] Phone: [phone]; Fax: [phone]

## 2021-03-19 ENCOUNTER — Ambulatory Visit: Payer: Medicare Other | Admitting: Internal Medicine

## 2021-03-21 ENCOUNTER — Encounter: Payer: Self-pay | Admitting: Internal Medicine

## 2021-03-21 ENCOUNTER — Other Ambulatory Visit: Payer: Self-pay

## 2021-03-21 ENCOUNTER — Ambulatory Visit: Payer: Medicare Other | Admitting: Internal Medicine

## 2021-03-21 VITALS — BP 128/70 | HR 72 | Ht 67.0 in | Wt 215.0 lb

## 2021-03-21 DIAGNOSIS — Z01810 Encounter for preprocedural cardiovascular examination: Secondary | ICD-10-CM | POA: Diagnosis not present

## 2021-03-21 DIAGNOSIS — I700 Atherosclerosis of aorta: Secondary | ICD-10-CM | POA: Diagnosis not present

## 2021-03-21 DIAGNOSIS — I447 Left bundle-branch block, unspecified: Secondary | ICD-10-CM

## 2021-03-21 MED ORDER — ASPIRIN EC 81 MG PO TBEC: 81.0000 mg | DELAYED_RELEASE_TABLET | Freq: Every day | ORAL | 3 refills | Status: AC

## 2021-03-21 MED ORDER — ATORVASTATIN CALCIUM 40 MG PO TABS: 40.0000 mg | ORAL_TABLET | Freq: Every day | ORAL | 3 refills | Status: DC

## 2021-03-21 NOTE — Patient Instructions (Addendum)
Medication Instructions:  Your physician has recommended you make the following change in your medication:  1.) start aspirin 81 mg daily (enteric coated) 2.) start atorvastatin 40 mg one tablet daily  *If you need a refill on your cardiac medications before your next appointment, please call your pharmacy*   Lab Work: none If you have labs (blood work) drawn today and your tests are completely normal, you will receive your results only by: [place] (if you have MyChart) OR A paper copy in the mail If you have any lab test that is abnormal or we need to change your treatment, we will call you to review the results.   Testing/Procedures: Your physician has requested that you have an echocardiogram. Echocardiography is a painless test that uses sound waves to create images of your heart. It provides your doctor with information about the size and shape of your heart and how well your hearts chambers and valves are working. This procedure takes approximately one hour. There are no restrictions for this procedure.   Follow-Up: As needed.

## 2021-03-22 ENCOUNTER — Other Ambulatory Visit: Payer: Medicare Other

## 2021-03-22 DIAGNOSIS — D72829 Elevated white blood cell count, unspecified: Secondary | ICD-10-CM | POA: Diagnosis not present

## 2021-03-23 LAB — CBC WITH DIFFERENTIAL/PLATELET
Basophils Absolute: 0.1 10*3/uL (ref 0.0–0.2)
Basos: 1 %
EOS (ABSOLUTE): 0.3 10*3/uL (ref 0.0–0.4)
Eos: 3 %
Hematocrit: 46.1 % (ref 34.0–46.6)
Hemoglobin: 15.4 g/dL (ref 11.1–15.9)
Immature Grans (Abs): 0 10*3/uL (ref 0.0–0.1)
Immature Granulocytes: 0 %
Lymphocytes Absolute: 3.7 10*3/uL — ABNORMAL HIGH (ref 0.7–3.1)
Lymphs: 34 %
MCH: 32.4 pg (ref 26.6–33.0)
MCHC: 33.4 g/dL (ref 31.5–35.7)
MCV: 97 fL (ref 79–97)
Monocytes Absolute: 0.7 10*3/uL (ref 0.1–0.9)
Monocytes: 7 %
Neutrophils Absolute: 6.2 10*3/uL (ref 1.4–7.0)
Neutrophils: 55 %
Platelets: 269 10*3/uL (ref 150–450)
RBC: 4.76 x10E6/uL (ref 3.77–5.28)
RDW: 13 % (ref 11.7–15.4)
WBC: 11 10*3/uL — ABNORMAL HIGH (ref 3.4–10.8)

## 2021-03-25 NOTE — Progress Notes (Signed)
Please let the patient know that her white blood count is improved but still slightly elevated. I would like to watch this and recheck again at her next visit. Thanks so much.   -HB

## 2021-03-26 ENCOUNTER — Telehealth: Payer: Self-pay | Admitting: Nurse Practitioner

## 2021-03-26 NOTE — Telephone Encounter (Signed)
Called pt she is advised of her results 

## 2021-03-26 NOTE — Telephone Encounter (Signed)
Patient called returning your call-please call back [phone]

## 2021-03-27 ENCOUNTER — Encounter: Payer: Self-pay | Admitting: Pulmonary Disease

## 2021-03-27 ENCOUNTER — Other Ambulatory Visit: Payer: Self-pay

## 2021-03-27 ENCOUNTER — Ambulatory Visit (INDEPENDENT_AMBULATORY_CARE_PROVIDER_SITE_OTHER): Payer: Medicare Other | Admitting: Pulmonary Disease

## 2021-03-27 VITALS — BP 120/82 | HR 117 | Temp 97.7°F | Ht 67.0 in | Wt 215.0 lb

## 2021-03-27 DIAGNOSIS — J432 Centrilobular emphysema: Secondary | ICD-10-CM | POA: Diagnosis not present

## 2021-03-27 DIAGNOSIS — R918 Other nonspecific abnormal finding of lung field: Secondary | ICD-10-CM | POA: Diagnosis not present

## 2021-03-27 DIAGNOSIS — Z720 Tobacco use: Secondary | ICD-10-CM

## 2021-03-27 DIAGNOSIS — J438 Other emphysema: Secondary | ICD-10-CM | POA: Diagnosis not present

## 2021-03-27 DIAGNOSIS — Z716 Tobacco abuse counseling: Secondary | ICD-10-CM

## 2021-03-27 MED ORDER — BREZTRI AEROSPHERE 160-9-4.8 MCG/ACT IN AERO: 2.0000 | INHALATION_SPRAY | Freq: Two times a day (BID) | RESPIRATORY_TRACT | 3 refills | Status: DC

## 2021-03-27 MED ORDER — BREZTRI AEROSPHERE 160-9-4.8 MCG/ACT IN AERO: 2.0000 | INHALATION_SPRAY | Freq: Two times a day (BID) | RESPIRATORY_TRACT | 0 refills | Status: DC

## 2021-03-27 NOTE — Progress Notes (Signed)
Synopsis: Referred in December 2022 for lung nodules by Ronnell Freshwater, NP  Subjective:   PATIENT ID: Katrina [name] GENDER: female DOB: July 09, 1955, MRN: [id number]  Chief Complaint  Patient presents with   Consult    Patient is here to talk about spot on her lung.    65 yo FM, PMH anxiety, asthma, GERD, lung infection. Current everyday smoker.  Patient was referred today for evaluation of CT imaging.Patient had a lung cancer screening CT completed on 12/30/2020 this was read as a lung RADS 2.  Patient had several pulmonary nodules identified.  The largest of which was a nonsolid nodule within the anterior right lower lobe measuring about 8.2 mm in size.  She also has associated paraseptal and centrilobular emphysema.  Patient was diagnosed with asthma in the past.  Currently on Flovent and as needed albuterol.  Has been told that she had COPD in the past as well.  She is also being seen by cardiology.  Has plans for a ankle surgery.   Past Medical History:  Diagnosis Date   Anxiety    Arthritis    knees, hips, elbows    Asthma    hx of asthma 3/12- hospitalized    COPD (chronic obstructive pulmonary disease) (HCC)    Depression    GERD (gastroesophageal reflux disease)    PRIOR TO HAVING GALLBLADDER REMOVED   Headache    from her neck   Left bundle branch block 06/28/2010   Lung infection 2015   Lung infection      Family History  Problem Relation Age of Onset   Heart disease Mother    High Cholesterol Mother    High blood pressure Mother    Depression Brother    Diabetes Brother      Past Surgical History:  Procedure Laterality Date   BACK SURGERY     x2   CARPAL TUNNEL RELEASE     left    CESAREAN SECTION     x 2   CHOLECYSTECTOMY     COLONOSCOPY  10/2017   DILATATION & CURETTAGE/HYSTEROSCOPY WITH MYOSURE N/A 12/10/2017   Procedure: DILATATION & CURETTAGE/HYSTEROSCOPY WITH MYOSURE;  Surgeon: Christophe Louis, MD;  Location: [facility];  Service: Gynecology;   Laterality: N/A;  POSSIBLE MYOMECTOMY VS POLYPECTOMY WITH MYOSURE   DILATION AND CURETTAGE OF UTERUS     ELBOW SURGERY     JOINT REPLACEMENT     KNEE ARTHROSCOPY     bilateral x 2    KNEE ARTHROSCOPY Left 11/03/2013   Procedure: LEFT KNEE ARTHROSCOPY WITH DEBRIDEMENT, PARTIAL SYNOVECTOMY;  Surgeon: Mcarthur Rossetti, MD;  Location: [facility];  Service: Orthopedics;  Laterality: Left;   LAPAROSCOPY     left elbow surgery      OTHER SURGICAL HISTORY     arthroswcopic surgery right knee    OTHER SURGICAL HISTORY     arthroscopic surgery left knee x 2    OTHER SURGICAL HISTORY     bunionectomy right foot    OTHER SURGICAL HISTORY     C Section x 2    OTHER SURGICAL HISTORY     carpal tunnel on left    POSTERIOR CERVICAL FUSION/FORAMINOTOMY N/A 04/02/2020   Procedure: RIGHT C5-6 FORAMINOTOMY;  Surgeon: Jessy Oto, MD;  Location: [facility];  Service: Orthopedics;  Laterality: N/A;   RADIOLOGY WITH ANESTHESIA N/A 07/28/2019   Procedure: MRI WITH ANESTHESIA OF NECK SOFT TISSUE ONLY WITH AND WITHOUT CONTRAST;  Surgeon: Radiologist, Medication, MD;  Location: [facility];  Service: Radiology;  Laterality: N/A;   RADIOLOGY WITH ANESTHESIA N/A 10/04/2019   Procedure: MRI WITH ANESTHESIA RIGHT SHOULDER WITHOUT CONTRAST,MRI OF CERVICAL SPINE WITHOUT CONTRAST;  Surgeon: Radiologist, Medication, MD;  Location: [facility];  Service: Radiology;  Laterality: N/A;   RADIOLOGY WITH ANESTHESIA N/A 08/30/2020   Procedure: MRI WITH ANESTHESIA CERVICAL SPINE WITHOUT CONTRAST;  Surgeon: Radiologist, Medication, MD;  Location: [facility];  Service: Radiology;  Laterality: N/A;   right foot bunionectomy      ROBOTIC ASSISTED TOTAL HYSTERECTOMY WITH BILATERAL SALPINGO OOPHERECTOMY Bilateral 06/16/2018   Procedure: XI ROBOTIC ASSISTED TOTAL HYSTERECTOMY WITH RIGHT SALPINGO OOPHORECTOMY;  Surgeon: Christophe Louis, MD;  Location: [facility];  Service: Gynecology;  Laterality: Bilateral;   TOTAL HIP ARTHROPLASTY  05/02/2011    Procedure: TOTAL HIP ARTHROPLASTY ANTERIOR APPROACH;  Surgeon: Mcarthur Rossetti, MD;  Location: [facility];  Service: Orthopedics;  Laterality: Left;  Left Total Hip Arthroplasty, Direct Anterior Approach   TOTAL KNEE ARTHROPLASTY Right 12/03/2012   Procedure: RIGHT TOTAL KNEE ARTHROPLASTY;  Surgeon: Mcarthur Rossetti, MD;  Location: [facility];  Service: Orthopedics;  Laterality: Right;   TOTAL KNEE ARTHROPLASTY Left 12/15/2014   Procedure: LEFT TOTAL KNEE ARTHROPLASTY;  Surgeon: Mcarthur Rossetti, MD;  Location: [facility];  Service: Orthopedics;  Laterality: Left;   TUBAL LIGATION     ULNAR NERVE TRANSPOSITION     left    UPPER GI ENDOSCOPY     x 2    Social History   Socioeconomic History   Marital status: Married    Spouse name: [name]   Number of children: 2   Years of education: GED   Highest education level: Not on file  Occupational History   Occupation: disabled  Tobacco Use   Smoking status: Every Day    Packs/day: 1.00    Years: 25.00    Pack years: 25.00    Types: Cigarettes   Smokeless tobacco: Never  Vaping Use   Vaping Use: Never used  Substance and Sexual Activity   Alcohol use: Yes    Alcohol/week: 49.0 standard drinks    Types: 49 Cans of beer per week   Drug use: No    Comment: hx of 34 years ago marijuana    Sexual activity: Not Currently  Other Topics Concern   Not on file  Social History Narrative   Not on file   Social Determinants of Health   Financial Resource Strain: Medium Risk   Difficulty of Paying Living Expenses: Somewhat hard  Food Insecurity: No Food Insecurity   Worried About Charity fundraiser in the Last Year: Never true   Ran Out of Food in the Last Year: Never true  Transportation Needs: No Transportation Needs   Lack of Transportation (Medical): No   Lack of Transportation (Non-Medical): No  Physical Activity: Insufficiently Active   Days of Exercise per Week: 2 days   Minutes of Exercise per Session: 30 min  Stress:  Stress Concern Present   Feeling of Stress : Very much  Social Connections: Moderately Isolated   Frequency of Communication with Friends and Family: More than three times a week   Frequency of Social Gatherings with Friends and Family: More than three times a week   Attends Religious Services: Never   Marine scientist or Organizations: No   Attends Archivist Meetings: Never   Marital Status: Married  Human resources officer Violence: Not At Risk   Fear of Current  or Ex-Partner: No   Emotionally Abused: No   Physically Abused: No   Sexually Abused: No     Allergies  Allergen Reactions   Codeine Itching and Nausea Only     Outpatient Medications Prior to Visit  Medication Sig Dispense Refill   acetaminophen (TYLENOL) 500 MG tablet Take 1,000 mg by mouth every 6 (six) hours as needed for moderate pain.     albuterol (VENTOLIN HFA) 108 (90 Base) MCG/ACT inhaler Inhale 2 puffs into the lungs every 6 (six) hours as needed for wheezing or shortness of breath. 8 g 11   aspirin EC 81 MG tablet Take 1 tablet (81 mg total) by mouth daily. Swallow whole. 90 tablet 3   atorvastatin (LIPITOR) 40 MG tablet Take 1 tablet (40 mg total) by mouth daily. 90 tablet 3   fluticasone (FLOVENT HFA) 110 MCG/ACT inhaler Inhale 1 puff into the lungs in the morning and at bedtime. 11 each 2   Multiple Vitamin (MULTIVITAMIN WITH MINERALS) TABS tablet Take 2 tablets by mouth daily.     No facility-administered medications prior to visit.    Review of Systems  Constitutional:  Negative for chills, fever, malaise/fatigue and weight loss.  HENT:  Negative for hearing loss, sore throat and tinnitus.   Eyes:  Negative for blurred vision and double vision.  Respiratory:  Positive for cough, shortness of breath and wheezing. Negative for hemoptysis, sputum production and stridor.   Cardiovascular:  Negative for chest pain, palpitations, orthopnea, leg swelling and PND.  Gastrointestinal:  Negative for  abdominal pain, constipation, diarrhea, heartburn, nausea and vomiting.  Genitourinary:  Negative for dysuria, hematuria and urgency.  Musculoskeletal:  Negative for joint pain and myalgias.  Skin:  Negative for itching and rash.  Neurological:  Negative for dizziness, tingling, weakness and headaches.  Endo/Heme/Allergies:  Negative for environmental allergies. Does not bruise/bleed easily.  Psychiatric/Behavioral:  Negative for depression. The patient is not nervous/anxious and does not have insomnia.   All other systems reviewed and are negative.   Objective:  Physical Exam Vitals reviewed.  Constitutional:      General: She is not in acute distress.    Appearance: She is well-developed.  HENT:     Head: Normocephalic and atraumatic.  Eyes:     General: No scleral icterus.    Conjunctiva/sclera: Conjunctivae normal.     Pupils: Pupils are equal, round, and reactive to light.  Neck:     Vascular: No JVD.     Trachea: No tracheal deviation.  Cardiovascular:     Rate and Rhythm: Normal rate and regular rhythm.     Heart sounds: Normal heart sounds. No murmur heard. Pulmonary:     Effort: Pulmonary effort is normal. No tachypnea, accessory muscle usage or respiratory distress.     Breath sounds: No stridor. No wheezing, rhonchi or rales.  Abdominal:     General: Bowel sounds are normal. There is no distension.     Palpations: Abdomen is soft.     Tenderness: There is no abdominal tenderness.  Musculoskeletal:        General: No tenderness.     Cervical back: Neck supple.  Lymphadenopathy:     Cervical: No cervical adenopathy.  Skin:    General: Skin is warm and dry.     Capillary Refill: Capillary refill takes less than 2 seconds.     Findings: No rash.  Neurological:     Mental Status: She is alert and oriented to person, place, and time.  Psychiatric:        Behavior: Behavior normal.     Vitals:   03/27/21 0942  BP: 120/82  Pulse: (!) 117  Temp: 97.7 F (36.5  C)  TempSrc: Oral  SpO2: 97%  Weight: 215 lb (97.5 kg)  Height: 5\' 7"  (1.702 m)   97% on RA BMI Readings from Last 3 Encounters:  03/27/21 33.67 kg/m  03/21/21 33.67 kg/m  03/07/21 33.67 kg/m   Wt Readings from Last 3 Encounters:  03/27/21 215 lb (97.5 kg)  03/21/21 215 lb (97.5 kg)  03/07/21 215 lb (97.5 kg)     CBC    Component Value Date/Time   WBC 11.0 (H) 03/22/2021 0900   WBC 9.4 03/29/2020 1130   RBC 4.76 03/22/2021 0900   RBC 4.83 03/29/2020 1130   HGB 15.4 03/22/2021 0900   HCT 46.1 03/22/2021 0900   PLT 269 03/22/2021 0900   MCV 97 03/22/2021 0900   MCH 32.4 03/22/2021 0900   MCH 31.5 03/29/2020 1130   MCHC 33.4 03/22/2021 0900   MCHC 32.6 03/29/2020 1130   RDW 13.0 03/22/2021 0900   LYMPHSABS 3.7 (H) 03/22/2021 0900   MONOABS 0.7 04/21/2014 1847   EOSABS 0.3 03/22/2021 0900   BASOSABS 0.1 03/22/2021 0900      Chest Imaging: 12/27/2020 lung cancer screening CT: Right lower lobe 8 mm lung nodule, associated paraseptal and centrilobular emphysema. The patient's images have been independently reviewed by me.    Pulmonary Functions Testing Results: No flowsheet data found.  FeNO:   Pathology:   Echocardiogram:   Heart Catheterization:     Assessment & Plan:     ICD-10-CM   1. Lung nodules  R91.8 CT Super D Chest Wo Contrast    Pulmonary Function Test    2. Centrilobular emphysema (HCC)  J43.2 CT Super D Chest Wo Contrast    3. Paraseptal emphysema (HCC)  J43.8 CT Super D Chest Wo Contrast    4. Tobacco abuse  Z72.0 CT Super D Chest Wo Contrast    5. Encounter for smoking cessation counseling  Z71.6       Discussion:  This is a 65 year old female, past medical history of tobacco abuse, centrilobular emphysema and paraseptal emphysema on recent CT imaging as well as 2 pulmonary nodules.  Both pulmonary nodules are subsolid in nature.  Unfortunately her daughter in her late 75s is currently battling lung cancer.  She also has another  family member with lung cancer.  Plan: I think that her nodules with her family history and her high risk for potential malignancy.  They are both small and some solid in nature and therefore she will need close follow-up. We will have a repeat noncontrasted CT scan of the chest 6 months from her previous which would be March 2023. We talked today about smoking cessation.  She has patches and gum.  She has been trying to quit. I do think that she needs a different inhaler regimen. We talked about changing Flovent to [place]. We will give her this new inhaler and hopefully this will help symptom management. She can continue albuterol for shortness of breath and wheezing.    Current Outpatient Medications:    acetaminophen (TYLENOL) 500 MG tablet, Take 1,000 mg by mouth every 6 (six) hours as needed for moderate pain., Disp: , Rfl:    albuterol (VENTOLIN HFA) 108 (90 Base) MCG/ACT inhaler, Inhale 2 puffs into the lungs every 6 (six) hours as needed for wheezing or shortness of breath.,  Disp: 8 g, Rfl: 11   aspirin EC 81 MG tablet, Take 1 tablet (81 mg total) by mouth daily. Swallow whole., Disp: 90 tablet, Rfl: 3   atorvastatin (LIPITOR) 40 MG tablet, Take 1 tablet (40 mg total) by mouth daily., Disp: 90 tablet, Rfl: 3   fluticasone (FLOVENT HFA) 110 MCG/ACT inhaler, Inhale 1 puff into the lungs in the morning and at bedtime., Disp: 11 each, Rfl: 2   Multiple Vitamin (MULTIVITAMIN WITH MINERALS) TABS tablet, Take 2 tablets by mouth daily., Disp: , Rfl:   I spent 62 minutes dedicated to the care of this patient on the date of this encounter to include pre-visit review of records, face-to-face time with the patient discussing conditions above, post visit ordering of testing, clinical documentation with the electronic health record, making appropriate referrals as documented, and communicating necessary findings to members of the patients care team.   Garner Nash, [facility] 03/27/2021 10:02 AM

## 2021-03-27 NOTE — Patient Instructions (Addendum)
Thank you for visiting Dr. Valeta Harms at [facility]. Today we recommend the following:  Orders Placed This Encounter  Procedures   CT Super D Chest Wo Contrast   Pulmonary Function Test   Breztri samples + new prescription   Repeat CT in 3 months, March 2023, follow up afterwards  Return in about 3 months (around 06/25/2021) for with Eric Form, NP, or Dr. Valeta Harms.    Please do your part to reduce the spread of COVID-19.

## 2021-04-12 ENCOUNTER — Ambulatory Visit (HOSPITAL_COMMUNITY): Payer: Medicare Other | Attending: Cardiology

## 2021-04-12 ENCOUNTER — Other Ambulatory Visit: Payer: Self-pay

## 2021-04-12 DIAGNOSIS — I700 Atherosclerosis of aorta: Secondary | ICD-10-CM | POA: Insufficient documentation

## 2021-04-12 DIAGNOSIS — I447 Left bundle-branch block, unspecified: Secondary | ICD-10-CM | POA: Insufficient documentation

## 2021-04-12 DIAGNOSIS — Z01810 Encounter for preprocedural cardiovascular examination: Secondary | ICD-10-CM | POA: Insufficient documentation

## 2021-04-12 LAB — ECHOCARDIOGRAM COMPLETE
Area-P 1/2: 4.67 cm2
MV M vel: 4.61 m/s
MV Peak grad: 85 mmHg
S' Lateral: 4.25 cm

## 2021-04-12 NOTE — Progress Notes (Signed)
I spoke with patient about TTE showing abnormal function, please arrange coronary angiography re: cardiomyopathy.

## 2021-04-17 ENCOUNTER — Other Ambulatory Visit: Payer: Self-pay

## 2021-04-17 ENCOUNTER — Ambulatory Visit (INDEPENDENT_AMBULATORY_CARE_PROVIDER_SITE_OTHER): Payer: Medicare Other | Admitting: Podiatry

## 2021-04-17 ENCOUNTER — Telehealth: Payer: Self-pay | Admitting: *Deleted

## 2021-04-17 DIAGNOSIS — M2012 Hallux valgus (acquired), left foot: Secondary | ICD-10-CM | POA: Diagnosis not present

## 2021-04-17 DIAGNOSIS — Z01818 Encounter for other preprocedural examination: Secondary | ICD-10-CM

## 2021-04-17 NOTE — Telephone Encounter (Signed)
-----   Message from Early Osmond, MD sent at 04/12/2021  5:14 PM EST ----- I spoke with patient about TTE showing abnormal function, please arrange coronary angiography re: cardiomyopathy.

## 2021-04-17 NOTE — Telephone Encounter (Addendum)
Pt's phone not accepting voicemail. Reached patient on daughter's phone.   Reviewed plan for R and L [facility] due to abnormal echo, decreased EF.  Pt will be scheduled for cath on Jan 20.  She will need an updated H&P, EKG and lab work prior.  I have scheduled her for an office visit on 1/17 with Dr. Ali Lowe.  Her cath is scheduled.  She will need instructions at the office visit.

## 2021-04-19 ENCOUNTER — Telehealth: Payer: Self-pay | Admitting: Internal Medicine

## 2021-04-19 NOTE — Telephone Encounter (Signed)
° °  Pre-operative Risk Assessment    Patient Name: Katrina [name]  DOB: 1955-12-16 MRN: [id number]      Request for Surgical Clearance    Procedure:  bunionectomy left foot  Date of Surgery:  Clearance 05/27/21                                 Surgeon:  Dr. Boneta Lucks Surgeon's Group or Practice Name:  [facility] Phone number:  [phone] Fax number:  [phone]   Type of Clearance Requested:   - Medical  - Pharmacy:  office would like to know if any medications should be held   Type of Anesthesia:  MAC with a block    Additional requests/questions:    Signed, Selena Zobro   04/19/2021, 2:36 PM

## 2021-04-19 NOTE — Telephone Encounter (Signed)
Recent echo showed low EF of 35%. Pending cath on 1/20.

## 2021-04-19 NOTE — Progress Notes (Signed)
Subjective:  Patient ID: Katrina [name], female    DOB: 03/06/1956,  MRN: [id number]  Chief Complaint  Patient presents with   Callouses    Left foot  Pt wants to discuss surgery again for her foot since she got cleared from her DR     66 y.o. female presents with the above complaint.  Patient presents with complaint left bunion deformity.  She states she is doing well.  The bunion is to continue hurt her.  She was scheduled with me to get a bunion surgery done however it had to be rescheduled because she started getting a flutter in the preoperative area.She is scheduled for TEE and further cardiac work-up.  This may postpone the surgery further.  However she would like to at least get it scheduled in February March time.  Review of Systems: Negative except as noted in the HPI. Denies N/V/F/Ch.  Past Medical History:  Diagnosis Date   Anxiety    Arthritis    knees, hips, elbows    Asthma    hx of asthma 3/12- hospitalized    COPD (chronic obstructive pulmonary disease) (HCC)    Depression    GERD (gastroesophageal reflux disease)    PRIOR TO HAVING GALLBLADDER REMOVED   Headache    from her neck   Left bundle branch block 06/28/2010   Lung infection 2015   Lung infection     Current Outpatient Medications:    acetaminophen (TYLENOL) 500 MG tablet, Take 1,000 mg by mouth every 6 (six) hours as needed for moderate pain., Disp: , Rfl:    albuterol (VENTOLIN HFA) 108 (90 Base) MCG/ACT inhaler, Inhale 2 puffs into the lungs every 6 (six) hours as needed for wheezing or shortness of breath., Disp: 8 g, Rfl: 11   aspirin EC 81 MG tablet, Take 1 tablet (81 mg total) by mouth daily. Swallow whole., Disp: 90 tablet, Rfl: 3   atorvastatin (LIPITOR) 40 MG tablet, Take 1 tablet (40 mg total) by mouth daily., Disp: 90 tablet, Rfl: 3   Budeson-Glycopyrrol-Formoterol (BREZTRI AEROSPHERE) 160-9-4.8 MCG/ACT AERO, Inhale 2 puffs into the lungs in the morning and at bedtime., Disp: 10.7 g, Rfl:  0   Budeson-Glycopyrrol-Formoterol (BREZTRI AEROSPHERE) 160-9-4.8 MCG/ACT AERO, Inhale 2 puffs into the lungs in the morning and at bedtime., Disp: 10.7 g, Rfl: 3   fluticasone (FLOVENT HFA) 110 MCG/ACT inhaler, Inhale 1 puff into the lungs in the morning and at bedtime., Disp: 11 each, Rfl: 2   Multiple Vitamin (MULTIVITAMIN WITH MINERALS) TABS tablet, Take 2 tablets by mouth daily., Disp: , Rfl:   Social History   Tobacco Use  Smoking Status Every Day   Packs/day: 1.00   Years: 25.00   Pack years: 25.00   Types: Cigarettes  Smokeless Tobacco Never    Allergies  Allergen Reactions   Codeine Itching and Nausea Only   Objective:  There were no vitals filed for this visit. There is no height or weight on file to calculate BMI. Constitutional Well developed. Well nourished.  Vascular Dorsalis pedis pulses palpable bilaterally. Posterior tibial pulses palpable bilaterally. Capillary refill normal to all digits.  No cyanosis or clubbing noted. Pedal hair growth normal.  Neurologic Normal speech. Oriented to person, place, and time. Epicritic sensation to light touch grossly present bilaterally.  Dermatologic Nails well groomed and normal in appearance. No open wounds. No skin lesions.  Orthopedic: Normal joint ROM without pain or crepitus bilaterally. Hallux abductovalgus deformity present Left 1st MPJ diminished range of  motion. Left 1st TMT without gross hypermobility. Right 1st MPJ diminished range of motion  Right 1st TMT without gross hypermobility. Lesser digital contractures absent bilaterally.   Radiographs: Taken and reviewed. Hallux abductovalgus deformity present. Metatarsal parabola normal.  1st/2nd IMA: Moderate; TSP: 6 out of 7.  Assessment:   1. Preoperative examination   2. Hallux abducto valgus, left     Plan:  Patient was evaluated and treated and all questions answered.  Hallux abductovalgus deformity, left -XR as above. -Patient has failed all  conservative therapy and wishes to proceed with surgical intervention. All risks, benefits, and alternatives discussed with patient. No guarantees given. Consent reviewed and signed by patient. Post-op course explained at length. -Planned procedures: Chevron osteotomy with possible phalangeal osteotomy -Risk factors: None -I discussed my preoperative intraoperative postoperative plan in extensive detail.  Patient states understand like to proceed the risk.  She will be weightbearing as tolerated in cam boot. -Informed surgical risk consent was reviewed and read aloud to the patient.  I reviewed the films.  I have discussed my findings with the patient in great detail.  I have discussed all risks including but not limited to infection, stiffness, scarring, limp, disability, deformity, damage to blood vessels and nerves, numbness, poor healing, need for braces, arthritis, chronic pain, amputation, death.  All benefits and realistic expectations discussed in great detail.  I have made no promises as to the outcome.  I have provided realistic expectations.  I have offered the patient a 2nd opinion, which they have declined and assured me they preferred to proceed despite the risks -She will need cardiac clearance prior to undergoing surgery.  She is scheduled for cardiac cath this month.  No follow-ups on file.

## 2021-04-23 ENCOUNTER — Other Ambulatory Visit: Payer: Self-pay

## 2021-04-23 ENCOUNTER — Ambulatory Visit (INDEPENDENT_AMBULATORY_CARE_PROVIDER_SITE_OTHER): Payer: Medicare Other | Admitting: Internal Medicine

## 2021-04-23 ENCOUNTER — Encounter: Payer: Self-pay | Admitting: Internal Medicine

## 2021-04-23 VITALS — BP 132/92 | HR 99 | Ht 67.0 in | Wt 220.6 lb

## 2021-04-23 DIAGNOSIS — I5020 Unspecified systolic (congestive) heart failure: Secondary | ICD-10-CM | POA: Diagnosis not present

## 2021-04-23 DIAGNOSIS — I420 Dilated cardiomyopathy: Secondary | ICD-10-CM

## 2021-04-23 DIAGNOSIS — Z720 Tobacco use: Secondary | ICD-10-CM | POA: Diagnosis not present

## 2021-04-23 DIAGNOSIS — R931 Abnormal findings on diagnostic imaging of heart and coronary circulation: Secondary | ICD-10-CM

## 2021-04-23 LAB — CBC
Hematocrit: 41 % (ref 34.0–46.6)
Hemoglobin: 14.5 g/dL (ref 11.1–15.9)
MCH: 32.9 pg (ref 26.6–33.0)
MCHC: 35.4 g/dL (ref 31.5–35.7)
MCV: 93 fL (ref 79–97)
Platelets: 258 10*3/uL (ref 150–450)
RBC: 4.41 x10E6/uL (ref 3.77–5.28)
RDW: 14.3 % (ref 11.7–15.4)
WBC: 12.5 10*3/uL — ABNORMAL HIGH (ref 3.4–10.8)

## 2021-04-23 LAB — BASIC METABOLIC PANEL
BUN/Creatinine Ratio: 24 (ref 12–28)
BUN: 16 mg/dL (ref 8–27)
CO2: 28 mmol/L (ref 20–29)
Calcium: 10.3 mg/dL (ref 8.7–10.3)
Chloride: 104 mmol/L (ref 96–106)
Creatinine, Ser: 0.67 mg/dL (ref 0.57–1.00)
Glucose: 101 mg/dL — ABNORMAL HIGH (ref 70–99)
Potassium: 4.3 mmol/L (ref 3.5–5.2)
Sodium: 141 mmol/L (ref 134–144)
eGFR: 97 mL/min/{1.73_m2} (ref >59)

## 2021-04-23 MED ORDER — LOSARTAN POTASSIUM 25 MG PO TABS: 25.0000 mg | ORAL_TABLET | Freq: Every day | ORAL | 3 refills | Status: DC

## 2021-04-23 MED ORDER — METOPROLOL SUCCINATE ER 25 MG PO TB24: 25.0000 mg | ORAL_TABLET | Freq: Every day | ORAL | 1 refills | Status: DC

## 2021-04-23 MED ORDER — EMPAGLIFLOZIN 10 MG PO TABS: 10.0000 mg | ORAL_TABLET | Freq: Every day | ORAL | 1 refills | Status: DC

## 2021-04-23 MED ORDER — SPIRONOLACTONE 25 MG PO TABS: 25.0000 mg | ORAL_TABLET | Freq: Every morning | ORAL | 3 refills | Status: DC

## 2021-04-23 NOTE — Progress Notes (Signed)
Lab are WNL.

## 2021-04-23 NOTE — H&P (View-Only) (Signed)
Cardiology Office Note:    Date:  04/23/2021   ID:  Katrina [name], DOB 1956/03/21, MRN [id number]  PCP:  Ronnell Freshwater, NP   [facility] Providers Cardiologist:  Lenna Sciara, MD Referring MD: Ronnell Freshwater, NP   Chief Complaint/Reason for Referral: Cardiology follow-up  ASSESSMENT:    Dilated cardiomyopathy [facility]) - Plan: EKG 24-MWNU, Basic metabolic panel, CBC  Tobacco abuse    PLAN:    In order of problems listed above:  Will refer for coronary angiography to characterize her cardiomyopathy.  We will start Toprol, losartan, spironolactone, and Jardiance.  Further recommendations to follow the results of her testing.  If she has a nonischemic cardiomyopathy I will refer the patient to pharmacy for heart failure medical titration.  We will repeat an echocardiogram in 3 months and see her back at that time..  If her ejection fraction remains under 35% we will refer the patient for CRT-D consideration.  She has had a number of orthopedic procedures recently without issues so my sense is she does not have an ischemic cardiomyopathy.  Depending on her coronary angiogram we will decide about the need for aspirin and atorvastatin.  I reiterated the need for abstinence from tobacco.        Shared Decision Making/Informed Consent The risks [stroke (1 in 1000), death (1 in 1000), kidney failure [usually temporary] (1 in 500), bleeding (1 in 200), allergic reaction [possibly serious] (1 in 200)], benefits (diagnostic support and management of coronary artery disease) and alternatives of a cardiac catheterization were discussed in detail with Katrina [name] and she is willing to proceed.   Dispo:  No follow-ups on file.     Medication Adjustments/Labs and Tests Ordered: Current medicines are reviewed at length with the patient today.  Concerns regarding medicines are outlined above.   Tests Ordered: Orders Placed This Encounter  Procedures   Basic metabolic panel   CBC    EKG 12-Lead    Medication Changes: No orders of the defined types were placed in this encounter.   History of Present Illness:    FOCUSED CARDIOVASCULAR PROBLEM LIST: 1.  New cardiomyopathy on echocardiogram December 2022 2.  Left bundle branch block 3.  Tobacco abuse   The patient is a 66 y.o. female with the indicated medical history here for follow-up of echocardiograph findings.  The patient was seen recently for preoperative evaluation.  She had a left bundle branch block.  An echocardiogram demonstrated new cardiomyopathy with an ejection fraction approximately 35 to 40%.  She has been under a great deal of stress.  She denies any increasing shortness of breath.  In fact her inhalers were changed by her other doctors and this improved her breathing.  She denies any peripheral edema but does on occasion report orthopnea.  She has required no emergency room visits or hospitalizations.  She is otherwise otherwise well but anxious about things.  Her foot surgery is currently scheduled for next month.        Previous Medical History: Past Medical History:  Diagnosis Date   Anxiety    Arthritis    knees, hips, elbows    Asthma    hx of asthma 3/12- hospitalized    COPD (chronic obstructive pulmonary disease) (HCC)    Depression    GERD (gastroesophageal reflux disease)    PRIOR TO HAVING GALLBLADDER REMOVED   Headache    from her neck   Left bundle branch block 06/28/2010   Lung infection 2015  Lung infection      Current Medications: Current Meds  Medication Sig   acetaminophen (TYLENOL) 500 MG tablet Take 1,000 mg by mouth every 6 (six) hours as needed for moderate pain.   aspirin EC 81 MG tablet Take 1 tablet (81 mg total) by mouth daily. Swallow whole.   atorvastatin (LIPITOR) 40 MG tablet Take 1 tablet (40 mg total) by mouth daily.   Budeson-Glycopyrrol-Formoterol (BREZTRI AEROSPHERE) 160-9-4.8 MCG/ACT AERO Inhale 2 puffs into the lungs in the morning and at  bedtime.   Multiple Vitamin (MULTIVITAMIN WITH MINERALS) TABS tablet Take 2 tablets by mouth daily.     Allergies:    Codeine   Social History:   Social History   Tobacco Use   Smoking status: Every Day    Packs/day: 1.00    Years: 25.00    Pack years: 25.00    Types: Cigarettes   Smokeless tobacco: Never  Vaping Use   Vaping Use: Never used  Substance Use Topics   Alcohol use: Yes    Alcohol/week: 49.0 standard drinks    Types: 49 Cans of beer per week   Drug use: No    Comment: hx of 34 years ago marijuana      Family Hx: Family History  Problem Relation Age of Onset   Heart disease Mother    High Cholesterol Mother    High blood pressure Mother    Depression Brother    Diabetes Brother      Review of Systems:   Please see the history of present illness.    All other systems reviewed and are negative.     EKGs/Labs/Other Test Reviewed:    EKG:  EKG today: Sinus rhythm with left bundle branch block and rare PVC; prior EKG:   Prior CV studies:  ECHO COMPLETE WO IMAGING ENHANCING AGENT 04/12/2021  1. Left ventricular ejection fraction, by estimation, is 35 to 40%. The left ventricle has moderately decreased function. There is paradoxical septal motion due to LBBB. The left ventricle demonstrates global hypokinesis, however, wall motion appears slightly worse in the basal-to-mid septal segments. The left ventricular internal cavity size was mildly dilated. There is moderate concentric left ventricular hypertrophy. Left ventricular diastolic parameters are consistent with Grade II diastolic dysfunction (pseudonormalization). 2. Right ventricular systolic function is normal. The right ventricular size is normal. 3. Left atrial size was moderately dilated. 4. The mitral valve is grossly normal. Mild mitral valve regurgitation. 5. The aortic valve is tricuspid. There is mild thickening of the aortic valve. Aortic valve regurgitation is not visualized. Aortic valve  sclerosis is present, with no evidence of aortic valve stenosis. 6. The inferior vena cava is normal in size with greater than 50% respiratory variability, suggesting right atrial pressure of 3 mmHg.     Imaging studies that I have independently reviewed today: Echocardiogram  Recent Labs: 03/04/2021: ALT 16; BUN 10; Creatinine, Ser 0.80; Potassium 4.1; Sodium 143; TSH 1.110 03/22/2021: Hemoglobin 15.4; Platelets 269   Recent Lipid Panel Lab Results  Component Value Date/Time   CHOL 250 (H) 03/04/2021 08:21 AM   TRIG 201 (H) 03/04/2021 08:21 AM   HDL 51 03/04/2021 08:21 AM   LDLCALC 162 (H) 03/04/2021 08:21 AM    Risk Assessment/Calculations:          Physical Exam:    VS:  BP (!) 132/92    Pulse 99    Ht 5\' 7"  (1.702 m)    Wt 220 lb 9.6 oz (100.1 kg)  SpO2 95%    BMI 34.55 kg/m    Wt Readings from Last 3 Encounters:  04/23/21 220 lb 9.6 oz (100.1 kg)  03/27/21 215 lb (97.5 kg)  03/21/21 215 lb (97.5 kg)    GENERAL:  No apparent distress, AOx3 HEENT:  No carotid bruits, +2 carotid impulses, no scleral icterus CAR: RRR no murmurs, gallops, rubs, or thrills RES:  Clear to auscultation bilaterally ABD:  Soft, nontender, nondistended, positive bowel sounds x 4 VASC:  +2 radial pulses, +2 carotid pulses, palpable pedal pulses NEURO:  CN 2-12 grossly intact; motor and sensory grossly intact PSYCH:  No active depression or anxiety EXT:  No edema, ecchymosis, or cyanosis  Signed, Early Osmond, MD  04/23/2021 8:39 AM    [address], [place], [place]  [address] Phone: [phone]; Fax: [phone]   Note:  This document was prepared using Dragon voice recognition software and may include unintentional dictation errors.

## 2021-04-23 NOTE — Patient Instructions (Addendum)
Medication Instructions:  Your physician has recommended you make the following change in your medication:   START Toprol XL 25 mg taking 1 at bedtime  START Losartan 25 mg taking 1 at bedtime  START Jardiance 10 mg taking 1 in the morning  START Spironolactone 25 mg taking 1 in the morning   *If you need a refill on your cardiac medications before your next appointment, please call your pharmacy*   Lab Work: TODAY:  BMET & CBC  If you have labs (blood work) drawn today and your tests are completely normal, you will receive your results only by: [place] (if you have MyChart) OR A paper copy in the mail If you have any lab test that is abnormal or we need to change your treatment, we will call you to review the results.   Testing/Procedures: Your physician has requested that you have an echocardiogram in 3 months Echocardiography is a painless test that uses sound waves to create images of your heart. It provides your doctor with information about the size and shape of your heart and how well your hearts chambers and valves are working. This procedure takes approximately one hour. There are no restrictions for this procedure.   Your physician has requested that you have a cardiac catheterization. Cardiac catheterization is used to diagnose and/or treat various heart conditions. Doctors may recommend this procedure for a number of different reasons. The most common reason is to evaluate chest pain. Chest pain can be a symptom of coronary artery disease (CAD), and cardiac catheterization can show whether plaque is narrowing or blocking your hearts arteries. This procedure is also used to evaluate the valves, as well as measure the blood flow and oxygen levels in different parts of your heart. For further information please visit [email or web address]. Please follow instruction sheet, [place]  OFFICE [place], [place] [address] Dept: [phone] Loc: [place]  04/23/2021  You are scheduled for a Cardiac Catheterization on Friday, January 20 with Dr. Lenna Sciara.  1. Please arrive at the [facility] (Main Entrance A) at [facility]: [address] [place], [place] [address] at 5:30 AM (This time is two hours before your procedure to ensure your preparation). Free valet parking service is available.   Special note: Every effort is made to have your procedure done on time. Please understand that emergencies sometimes delay scheduled procedures.  2. Diet: Do not eat solid foods after midnight.  The patient may have clear liquids until 5am upon the day of the procedure.  3. Labs: You will need to have blood drawn on TODAY  4. Medication instructions in preparation for your procedure:   Contrast Allergy: No   On the morning of your procedure, take your Aspirin and any morning medicines NOT listed above.  You may use sips of water.  5. Plan for one night stay--bring personal belongings. 6. Bring a current list of your medications and current insurance cards. 7. You MUST have a responsible person to drive you home. 8. Someone MUST be with you the first 24 hours after you arrive home or your discharge will be delayed. 9. Please wear clothes that are easy to get on and off and wear slip-on shoes.  Thank you for allowing [place] to care for you!   -- [facility]    You have been referred to [facility]  FOR HEART FAILURE MEDICATION TITRATION IN 1 WEEK   Follow-Up: At [facility], you and your health needs are our priority.  As part of our continuing mission to provide you with exceptional heart care, we have created designated Provider Care Teams.  These Care Teams include your primary Cardiologist (physician) and Advanced Practice Providers (APPs -  Physician Assistants and Nurse  Practitioners) who all work together to provide you with the care you need, when you need it.  We recommend signing up for the patient portal called "MyChart".  Sign up information is provided on this After Visit Summary.  MyChart is used to connect with patients for Virtual Visits (Telemedicine).  Patients are able to view lab/test results, encounter notes, upcoming appointments, etc.  Non-urgent messages can be sent to your provider as well.   To learn more about what you can do with MyChart, go to [email or web address].    Your next appointment:   3 MONTHS (AFTER ECHO)  The format for your next appointment:   In Person  Provider:   Early Osmond, MD     Other Instructions

## 2021-04-23 NOTE — Progress Notes (Addendum)
Cardiology Office Note:    Date:  04/23/2021   ID:  Katrina [name], DOB [birth year], MRN [id number]  PCP:  Ronnell Freshwater, NP   [facility] Providers Cardiologist:  Lenna Sciara, MD Referring MD: Ronnell Freshwater, NP   Chief Complaint/Reason for Referral: Cardiology follow-up  ASSESSMENT:    Dilated cardiomyopathy Katrina [name]) - Plan: EKG 61-PJKD, Basic metabolic panel, CBC  Tobacco abuse  Aortic atherosclerosis  PLAN:    In order of problems listed above:  Will refer for coronary angiography to characterize her cardiomyopathy.  We will start Toprol, losartan, spironolactone, and Jardiance.  Further recommendations to follow the results of her testing.  If she has a nonischemic cardiomyopathy I will refer the patient to pharmacy for heart failure medical titration.  We will repeat an echocardiogram in 3 months and see her back at that time..  If her ejection fraction remains under 35% we will refer the patient for CRT-D consideration.  She has had a number of orthopedic procedures recently without issues so my sense is she does not have an ischemic cardiomyopathy.    I reiterated the need for abstinence from tobacco.  Continue ASA and atorvastatin.  Shared Decision Making/Informed Consent The risks [stroke (1 in 1000), death (1 in 1000), kidney failure [usually temporary] (1 in 500), bleeding (1 in 200), allergic reaction [possibly serious] (1 in 200)], benefits (diagnostic support and management of coronary artery disease) and alternatives of a cardiac catheterization were discussed in detail with [name] and she is willing to proceed.   Dispo:  No follow-ups on file.     Medication Adjustments/Labs and Tests Ordered: Current medicines are reviewed at length with the patient today.  Concerns regarding medicines are outlined above.   Tests Ordered: Orders Placed This Encounter  Procedures   Basic metabolic panel   CBC   EKG 12-Lead    Medication Changes: No  orders of the defined types were placed in this encounter.   History of Present Illness:    FOCUSED CARDIOVASCULAR PROBLEM LIST: 1.  New cardiomyopathy on echocardiogram December 2022 2.  Left bundle branch block 3.  Tobacco abuse   The patient is a 66 y.o. female with the indicated medical history here for follow-up of echocardiograph findings.  The patient was seen recently for preoperative evaluation.  She had a left bundle branch block.  An echocardiogram demonstrated new cardiomyopathy with an ejection fraction approximately 35 to 40%.  She has been under a great deal of stress.  She denies any increasing shortness of breath.  In fact her inhalers were changed by her other doctors and this improved her breathing.  She denies any peripheral edema but does on occasion report orthopnea.  She has required no emergency room visits or hospitalizations.  She is otherwise otherwise well but anxious about things.  Her foot surgery is currently scheduled for next month.        Previous Medical History: Past Medical History:  Diagnosis Date   Anxiety    Arthritis    knees, hips, elbows    Asthma    hx of asthma 3/12- hospitalized    COPD (chronic obstructive pulmonary disease) (HCC)    Depression    GERD (gastroesophageal reflux disease)    PRIOR TO HAVING GALLBLADDER REMOVED   Headache    from her neck   Left bundle branch block 06/28/2010   Lung infection 2015   Lung infection      Current Medications: Current Meds  Medication  Sig   acetaminophen (TYLENOL) 500 MG tablet Take 1,000 mg by mouth every 6 (six) hours as needed for moderate pain.   aspirin EC 81 MG tablet Take 1 tablet (81 mg total) by mouth daily. Swallow whole.   atorvastatin (LIPITOR) 40 MG tablet Take 1 tablet (40 mg total) by mouth daily.   Budeson-Glycopyrrol-Formoterol (BREZTRI AEROSPHERE) 160-9-4.8 MCG/ACT AERO Inhale 2 puffs into the lungs in the morning and at bedtime.   Multiple Vitamin (MULTIVITAMIN WITH  MINERALS) TABS tablet Take 2 tablets by mouth daily.     Allergies:    Codeine   Social History:   Social History   Tobacco Use   Smoking status: Every Day    Packs/day: 1.00    Years: 25.00    Pack years: 25.00    Types: Cigarettes   Smokeless tobacco: Never  Vaping Use   Vaping Use: Never used  Substance Use Topics   Alcohol use: Yes    Alcohol/week: 49.0 standard drinks    Types: 49 Cans of beer per week   Drug use: No    Comment: hx of 34 years ago marijuana      Family Hx: Family History  Problem Relation Age of Onset   Heart disease Mother    High Cholesterol Mother    High blood pressure Mother    Depression Brother    Diabetes Brother      Review of Systems:   Please see the history of present illness.    All other systems reviewed and are negative.     EKGs/Labs/Other Test Reviewed:    EKG:  EKG today: Sinus rhythm with left bundle branch block and rare PVC; prior EKG:   Prior CV studies:  ECHO COMPLETE WO IMAGING ENHANCING AGENT 04/12/2021  1. Left ventricular ejection fraction, by estimation, is 35 to 40%. The left ventricle has moderately decreased function. There is paradoxical septal motion due to LBBB. The left ventricle demonstrates global hypokinesis, however, wall motion appears slightly worse in the basal-to-mid septal segments. The left ventricular internal cavity size was mildly dilated. There is moderate concentric left ventricular hypertrophy. Left ventricular diastolic parameters are consistent with Grade II diastolic dysfunction (pseudonormalization). 2. Right ventricular systolic function is normal. The right ventricular size is normal. 3. Left atrial size was moderately dilated. 4. The mitral valve is grossly normal. Mild mitral valve regurgitation. 5. The aortic valve is tricuspid. There is mild thickening of the aortic valve. Aortic valve regurgitation is not visualized. Aortic valve sclerosis is present, with no evidence of aortic  valve stenosis. 6. The inferior vena cava is normal in size with greater than 50% respiratory variability, suggesting right atrial pressure of 3 mmHg.     Imaging studies that I have independently reviewed today: Echocardiogram  Recent Labs: 03/04/2021: ALT 16; BUN 10; Creatinine, Ser 0.80; Potassium 4.1; Sodium 143; TSH 1.110 03/22/2021: Hemoglobin 15.4; Platelets 269   Recent Lipid Panel Lab Results  Component Value Date/Time   CHOL 250 (H) 03/04/2021 08:21 AM   TRIG 201 (H) 03/04/2021 08:21 AM   HDL 51 03/04/2021 08:21 AM   LDLCALC 162 (H) 03/04/2021 08:21 AM    Risk Assessment/Calculations:          Physical Exam:    VS:  BP (!) 132/92    Pulse 99    Ht 5\' 7"  (1.702 m)    Wt 220 lb 9.6 oz (100.1 kg)    SpO2 95%    BMI 34.55 kg/m  Wt Readings from Last 3 Encounters:  04/23/21 220 lb 9.6 oz (100.1 kg)  03/27/21 215 lb (97.5 kg)  03/21/21 215 lb (97.5 kg)    GENERAL:  No apparent distress, AOx3 HEENT:  No carotid bruits, +2 carotid impulses, no scleral icterus CAR: RRR no murmurs, gallops, rubs, or thrills RES:  Clear to auscultation bilaterally ABD:  Soft, nontender, nondistended, positive bowel sounds x 4 VASC:  +2 radial pulses, +2 carotid pulses, palpable pedal pulses NEURO:  CN 2-12 grossly intact; motor and sensory grossly intact PSYCH:  No active depression or anxiety EXT:  No edema, ecchymosis, or cyanosis  Signed, Early Osmond, MD  04/23/2021 8:39 AM    [address], [place], [place]  [address] Phone: [phone]; Fax: [phone]   Note:  This document was prepared using Dragon voice recognition software and may include unintentional dictation errors.

## 2021-04-25 ENCOUNTER — Telehealth: Payer: Self-pay | Admitting: *Deleted

## 2021-04-25 NOTE — Telephone Encounter (Signed)
Cardiac catheterization scheduled at [facility] for: Friday April 26, 2021 7:30 [facility] Main Entrance A [facility]) at: 5:30 AM   Diet-no solid food after midnight prior to cath, clear liquids until 5 AM day of procedure.  Medication instructions for procedure: -Usual morning medications can be taken pre-cath with sips of water including aspirin 81 mg.    Confirmed patient has responsible adult to drive home post procedure and be with patient first 24 hours after arriving home.  [facility] does allow one visitor to accompany you and wait in the hospital waiting room while you are there for your procedure. You and your visitor will be asked to wear a mask once you enter the hospital.   Patient reports does not currently have any new symptoms concerning for COVID-19 and no household members with COVID-19 like illness.     Reviewed procedure/mask/visitor instructions with patient.

## 2021-04-26 ENCOUNTER — Encounter (HOSPITAL_COMMUNITY)
Admission: RE | Disposition: A | Discharge status: Home / Self Care | Payer: Self-pay | Source: Home / Self Care | Attending: Internal Medicine

## 2021-04-26 ENCOUNTER — Ambulatory Visit (HOSPITAL_COMMUNITY)
Admission: RE | Admit: 2021-04-26 | Discharge: 2021-04-26 | Disposition: A | Discharge status: Home / Self Care | Payer: Medicare Other | Attending: Internal Medicine | Admitting: Internal Medicine

## 2021-04-26 ENCOUNTER — Other Ambulatory Visit: Payer: Self-pay

## 2021-04-26 DIAGNOSIS — F17210 Nicotine dependence, cigarettes, uncomplicated: Secondary | ICD-10-CM | POA: Insufficient documentation

## 2021-04-26 DIAGNOSIS — I447 Left bundle-branch block, unspecified: Secondary | ICD-10-CM | POA: Insufficient documentation

## 2021-04-26 DIAGNOSIS — I428 Other cardiomyopathies: Secondary | ICD-10-CM | POA: Diagnosis not present

## 2021-04-26 DIAGNOSIS — I700 Atherosclerosis of aorta: Secondary | ICD-10-CM | POA: Insufficient documentation

## 2021-04-26 DIAGNOSIS — Z7982 Long term (current) use of aspirin: Secondary | ICD-10-CM | POA: Insufficient documentation

## 2021-04-26 DIAGNOSIS — Z79899 Other long term (current) drug therapy: Secondary | ICD-10-CM | POA: Diagnosis not present

## 2021-04-26 HISTORY — PX: RIGHT/LEFT HEART CATH AND CORONARY ANGIOGRAPHY: CATH118266

## 2021-04-26 LAB — POCT I-STAT EG7
Acid-Base Excess: 0 mmol/L (ref 0.0–2.0)
Acid-Base Excess: 0 mmol/L (ref 0.0–2.0)
Bicarbonate: 23.9 mmol/L (ref 20.0–28.0)
Bicarbonate: 26 mmol/L (ref 20.0–28.0)
Calcium, Ion: 1.16 mmol/L (ref 1.15–1.40)
Calcium, Ion: 1.2 mmol/L (ref 1.15–1.40)
HCT: 39 % (ref 36.0–46.0)
HCT: 39 % (ref 36.0–46.0)
Hemoglobin: 13.3 g/dL (ref 12.0–15.0)
Hemoglobin: 13.3 g/dL (ref 12.0–15.0)
O2 Saturation: 75 %
O2 Saturation: 98 %
Potassium: 3.7 mmol/L (ref 3.5–5.1)
Potassium: 3.7 mmol/L (ref 3.5–5.1)
Sodium: 142 mmol/L (ref 135–145)
Sodium: 142 mmol/L (ref 135–145)
TCO2: 25 mmol/L (ref 22–32)
TCO2: 27 mmol/L (ref 22–32)
pCO2, Ven: 36.5 mmHg — ABNORMAL LOW (ref 44.0–60.0)
pCO2, Ven: 45.3 mmHg (ref 44.0–60.0)
pH, Ven: 7.366 (ref 7.250–7.430)
pH, Ven: 7.424 (ref 7.250–7.430)
pO2, Ven: 111 mmHg — ABNORMAL HIGH (ref 32.0–45.0)
pO2, Ven: 42 mmHg (ref 32.0–45.0)

## 2021-04-26 SURGERY — RIGHT/LEFT HEART CATH AND CORONARY ANGIOGRAPHY
Anesthesia: LOCAL

## 2021-04-26 MED ORDER — ONDANSETRON HCL 4 MG/2ML IJ SOLN: 4.0000 mg | Freq: Four times a day (QID) | INTRAMUSCULAR | Status: DC | PRN

## 2021-04-26 MED ORDER — HEPARIN SODIUM (PORCINE) 1000 UNIT/ML IJ SOLN
INTRAMUSCULAR | Status: DC | PRN
  Administered 2021-04-26: 5000 [IU] via INTRAVENOUS

## 2021-04-26 MED ORDER — MIDAZOLAM HCL 2 MG/2ML IJ SOLN
INTRAMUSCULAR | Status: DC | PRN
  Administered 2021-04-26 (×2): 1 mg via INTRAVENOUS

## 2021-04-26 MED ORDER — FENTANYL CITRATE (PF) 100 MCG/2ML IJ SOLN
INTRAMUSCULAR | Status: AC
  Filled 2021-04-26: qty 2

## 2021-04-26 MED ORDER — SODIUM CHLORIDE 0.9 % IV SOLN: INTRAVENOUS | Status: DC

## 2021-04-26 MED ORDER — LIDOCAINE HCL (PF) 1 % IJ SOLN
INTRAMUSCULAR | Status: AC
  Filled 2021-04-26: qty 30

## 2021-04-26 MED ORDER — LORAZEPAM 0.5 MG PO TABS
ORAL_TABLET | ORAL | Status: AC
  Filled 2021-04-26: qty 1

## 2021-04-26 MED ORDER — HEPARIN (PORCINE) IN NACL 1000-0.9 UT/500ML-% IV SOLN
INTRAVENOUS | Status: DC | PRN
  Administered 2021-04-26 (×2): 500 mL

## 2021-04-26 MED ORDER — LABETALOL HCL 5 MG/ML IV SOLN: 10.0000 mg | INTRAVENOUS | Status: DC | PRN

## 2021-04-26 MED ORDER — SODIUM CHLORIDE 0.9% FLUSH: 3.0000 mL | Freq: Two times a day (BID) | INTRAVENOUS | Status: DC

## 2021-04-26 MED ORDER — HYDRALAZINE HCL 20 MG/ML IJ SOLN: 10.0000 mg | INTRAMUSCULAR | Status: DC | PRN

## 2021-04-26 MED ORDER — LIDOCAINE HCL (PF) 1 % IJ SOLN
INTRAMUSCULAR | Status: DC | PRN
  Administered 2021-04-26 (×2): 2 mL

## 2021-04-26 MED ORDER — FENTANYL CITRATE (PF) 100 MCG/2ML IJ SOLN
INTRAMUSCULAR | Status: DC | PRN
  Administered 2021-04-26 (×2): 25 ug via INTRAVENOUS

## 2021-04-26 MED ORDER — IOHEXOL 350 MG/ML SOLN
INTRAVENOUS | Status: DC | PRN
  Administered 2021-04-26: 45 mL

## 2021-04-26 MED ORDER — VERAPAMIL HCL 2.5 MG/ML IV SOLN
INTRAVENOUS | Status: DC | PRN
  Administered 2021-04-26: 10 mL via INTRA_ARTERIAL

## 2021-04-26 MED ORDER — LORAZEPAM 0.5 MG PO TABS
0.5000 mg | ORAL_TABLET | Freq: Once | ORAL | Status: AC
Start: 2021-04-26 — End: 2021-04-26
  Administered 2021-04-26: 0.5 mg via ORAL

## 2021-04-26 MED ORDER — ASPIRIN 81 MG PO CHEW: 81.0000 mg | CHEWABLE_TABLET | ORAL | Status: DC

## 2021-04-26 MED ORDER — SODIUM CHLORIDE 0.9% FLUSH: 3.0000 mL | INTRAVENOUS | Status: DC | PRN

## 2021-04-26 MED ORDER — SODIUM CHLORIDE 0.9 % IV SOLN: 250.0000 mL | INTRAVENOUS | Status: DC | PRN

## 2021-04-26 MED ORDER — HEPARIN SODIUM (PORCINE) 1000 UNIT/ML IJ SOLN
INTRAMUSCULAR | Status: AC
  Filled 2021-04-26: qty 10

## 2021-04-26 MED ORDER — MIDAZOLAM HCL 2 MG/2ML IJ SOLN
INTRAMUSCULAR | Status: AC
  Filled 2021-04-26: qty 2

## 2021-04-26 MED ORDER — ACETAMINOPHEN 325 MG PO TABS: 650.0000 mg | ORAL_TABLET | ORAL | Status: DC | PRN

## 2021-04-26 MED ORDER — HEPARIN (PORCINE) IN NACL 1000-0.9 UT/500ML-% IV SOLN
INTRAVENOUS | Status: AC
  Filled 2021-04-26: qty 1000

## 2021-04-26 MED ORDER — VERAPAMIL HCL 2.5 MG/ML IV SOLN
INTRAVENOUS | Status: AC
  Filled 2021-04-26: qty 2

## 2021-04-26 SURGICAL SUPPLY — 14 items
CATH BALLN WEDGE 5F 110CM (CATHETERS) ×1 IMPLANT
CATH DIAG 6FR JR4 (CATHETERS) ×1 IMPLANT
CATH DIAG 6FR PIGTAIL ANGLED (CATHETERS) ×1 IMPLANT
CATH INFINITI 6F FL3.5 (CATHETERS) ×1 IMPLANT
DEVICE RAD COMP TR BAND LRG (VASCULAR PRODUCTS) ×1 IMPLANT
GLIDESHEATH SLEND SS 6F .021 (SHEATH) ×1 IMPLANT
GUIDEWIRE .025 260CM (WIRE) ×1 IMPLANT
GUIDEWIRE INQWIRE 1.5J.035X260 (WIRE) IMPLANT
INQWIRE 1.5J .035X260CM (WIRE) ×2
KIT HEART LEFT (KITS) ×2 IMPLANT
PACK CARDIAC CATHETERIZATION (CUSTOM PROCEDURE TRAY) ×2 IMPLANT
SHEATH GLIDE SLENDER 4/5FR (SHEATH) ×1 IMPLANT
TRANSDUCER W/STOPCOCK (MISCELLANEOUS) ×2 IMPLANT
TUBING CIL FLEX 10 FLL-RA (TUBING) ×2 IMPLANT

## 2021-04-26 NOTE — Interval H&P Note (Signed)
History and Physical Interval Note:  04/26/2021 6:43 AM  Katrina [name]  has presented today for surgery, with the diagnosis of abnormal echo.  The various methods of treatment have been discussed with the patient and family. After consideration of risks, benefits and other options for treatment, the patient has consented to  Procedure(s): RIGHT/LEFT HEART CATH AND CORONARY ANGIOGRAPHY (N/A) as a surgical intervention.  The patient's history has been reviewed, patient examined, no change in status, stable for surgery.  I have reviewed the patient's chart and labs.  Questions were answered to the patient's satisfaction.    Cath Lab Visit (complete for each Cath Lab visit)  Clinical Evaluation Leading to the Procedure:   ACS: No.  Non-ACS:    Anginal Classification: No Symptoms  Anti-ischemic medical therapy: Minimal Therapy (1 class of medications)  Non-Invasive Test Results: No non-invasive testing performed  Prior CABG: No previous CABG        Early Osmond

## 2021-04-27 LAB — POCT I-STAT EG7
Acid-Base Excess: 0 mmol/L (ref 0.0–2.0)
Bicarbonate: 25.1 mmol/L (ref 20.0–28.0)
Calcium, Ion: 1.16 mmol/L (ref 1.15–1.40)
HCT: 37 % (ref 36.0–46.0)
Hemoglobin: 12.6 g/dL (ref 12.0–15.0)
O2 Saturation: 81 %
Potassium: 3.5 mmol/L (ref 3.5–5.1)
Sodium: 143 mmol/L (ref 135–145)
TCO2: 26 mmol/L (ref 22–32)
pCO2, Ven: 43.3 mmHg — ABNORMAL LOW (ref 44.0–60.0)
pH, Ven: 7.372 (ref 7.250–7.430)
pO2, Ven: 47 mmHg — ABNORMAL HIGH (ref 32.0–45.0)

## 2021-04-29 ENCOUNTER — Telehealth: Payer: Self-pay | Admitting: Urology

## 2021-04-29 ENCOUNTER — Encounter (HOSPITAL_COMMUNITY): Payer: Self-pay | Admitting: Internal Medicine

## 2021-04-29 NOTE — Telephone Encounter (Signed)
° °  Primary Cardiologist: Early Osmond, MD  Chart reviewed as part of pre-operative protocol coverage. Given past medical history and time since last visit, based on ACC/AHA guidelines, DAWANNA GRAUBERGER would be at acceptable risk for the planned procedure without further cardiovascular testing.   If felt to be necessary by surgeon aspirin may be held for 5-7 days prior to surgery.  Per Dr. Ali Lowe " No CAD.  Patient is a low risk for perioperative cardiovascular complication."  I will route this recommendation to the requesting party via Epic fax function and remove from pre-op pool.  Please call with questions.  Jossie Ng. Jonan Seufert NP-C    04/29/2021, [address] Office [phone] Fax [phone]

## 2021-04-29 NOTE — Telephone Encounter (Signed)
DOS - 05/27/21  [place] BUNIONECTOMY LEFT --- 01720 DOUBLE OSTEOTOMY LEFT --- 91068   [employer] EFFECTIVE DATE - 04/07/16  NO PRIOR AUTH REQUIRED

## 2021-04-29 NOTE — Progress Notes (Signed)
Patient ID: Katrina [name]                 DOB: [birth year]                      MRN: [id number]     HPI: Katrina [name] is a 66 y.o. female referred by Dr. Ali Lowe to pharmacy clinic for HF medication management. PMH is significant for dilated cardiomyopathy, left bundle branch block, tobacco abuse, and aortic atherosclerosis. Recent [facility] 04/26/21 revealed nonischemic cardiomyopathy. Most recent LVEF 35-40% on 04/12/21. At last visit with MD on 04/23/21, Jardiance, losartan, metoprolol succinate, and spironolactone were started.   Today she returns to pharmacy clinic for further medication titration. Symptomatically, she is feeling fatigued. Reports dizziness, lightheadedness, blurred vision yesterday midday and she felt like she was going to pass out. She also had a panic attack last week but suspects this is due to nerves before the cath and not due to her medications. Denies chest pain, SOB, or palpitations. Able to complete all ADLs. Activity level has been down lately due to feeling fatigued. She hasn't been checking her weight at home because she has had so many doctor's appointments lately. Denies LEE, PND, or orthopnea. Appetite has been down from feeling nauseous with new medications. She adheres to a low-salt diet except yesterday she ate extra salt because her BP was low. She has been adherent to her medications and taken them today.   Current CHF meds: metoprolol succinate 25 mg daily, losartan 25 mg daily, spironolactone 25 mg daily, Jardiance 10 mg daily BP goal: <130/80 mmHg  Family History: heart disease, HLD, HTN in mother, DM in brother  Social History: current smoker  Diet: avoiding foods high in salt  Exercise: limited by foot, takes care of husband who is in a wheelchair  Home BP readings: prior to starting medicine BP was normal, yesterday was 91/61 - felt dizzy, blurry vision. Today BP was 130s/80s.   Labs:  -04/23/21: Scr 0.67, Na 141, K 4.3 (no HF medications)  Wt  Readings from Last 3 Encounters:  04/30/21 219 lb 9.6 oz (99.6 kg)  04/26/21 220 lb (99.8 kg)  04/23/21 220 lb 9.6 oz (100.1 kg)   BP Readings from Last 3 Encounters:  04/26/21 123/69  04/23/21 (!) 132/92  03/27/21 120/82   Pulse Readings from Last 3 Encounters:  04/26/21 91  04/23/21 99  03/27/21 (!) 117    Renal function: Estimated Creatinine Clearance: 85 mL/min (by C-G formula based on SCr of 0.67 mg/dL).  Past Medical History:  Diagnosis Date   Anxiety    Arthritis    knees, hips, elbows    Asthma    hx of asthma 3/12- hospitalized    COPD (chronic obstructive pulmonary disease) (HCC)    Depression    GERD (gastroesophageal reflux disease)    PRIOR TO HAVING GALLBLADDER REMOVED   Headache    from her neck   Left bundle branch block 06/28/2010   Lung infection 2015   Lung infection     Current Outpatient Medications on File Prior to Visit  Medication Sig Dispense Refill   acetaminophen (TYLENOL) 500 MG tablet Take 1,000 mg by mouth every 6 (six) hours as needed for moderate pain.     aspirin EC 81 MG tablet Take 1 tablet (81 mg total) by mouth daily. Swallow whole. 90 tablet 3   atorvastatin (LIPITOR) 40 MG tablet Take 1 tablet (40 mg total) by mouth daily. Tensed  tablet 3   Budeson-Glycopyrrol-Formoterol (BREZTRI AEROSPHERE) 160-9-4.8 MCG/ACT AERO Inhale 2 puffs into the lungs in the morning and at bedtime. 10.7 g 3   empagliflozin (JARDIANCE) 10 MG TABS tablet Take 1 tablet (10 mg total) by mouth daily before breakfast. 30 tablet 1   losartan (COZAAR) 25 MG tablet Take 1 tablet (25 mg total) by mouth at bedtime. 90 tablet 3   metoprolol succinate (TOPROL XL) 25 MG 24 hr tablet Take 1 tablet (25 mg total) by mouth at bedtime. 90 tablet 1   Multiple Vitamin (MULTIVITAMIN WITH MINERALS) TABS tablet Take 2 tablets by mouth daily.     spironolactone (ALDACTONE) 25 MG tablet Take 1 tablet (25 mg total) by mouth every morning. (Patient not taking: Reported on 04/25/2021) 90  tablet 3   [DISCONTINUED] clonazePAM (KLONOPIN) 0.5 MG tablet Take 1 tablet (0.5 mg total) by mouth 2 (two) times daily as needed for anxiety. 30 tablet 0   No current facility-administered medications on file prior to visit.    Allergies  Allergen Reactions   Codeine Itching and Nausea Only     Assessment/Plan:  1. CHF - BP near goal <130/80 mmHg in clinic today however patient reported hypotension with BP 90s/60s and symptoms of dizziness, lightheadedness, and blurred vision yesterday. Will therefore decrease losartan to 12.5 mg (1/2 tablet) and spironolactone to 12.5 mg (1/2 tablet) daily for now. Continue Jardiance 10 mg daily and metoprolol succinate 25 mg daily. Counseled her to take metoprolol with food. Checking BMET today after starting these new medications. Continue checking BP daily and if she experiences symptoms of low or high BP and keep a log. Bring BP cuff to next visit so it can be checked for accuracy. Follow up visit in 3 weeks. Will try to titrate medications back up more slowly to avoid hypotension.   Rebbeca Paul, PharmD PGY2 Ambulatory Care Pharmacy Resident 04/30/2021 1:30 PM

## 2021-04-30 ENCOUNTER — Ambulatory Visit: Payer: Medicare Other | Admitting: Student-PharmD

## 2021-04-30 ENCOUNTER — Other Ambulatory Visit: Payer: Self-pay

## 2021-04-30 VITALS — BP 132/80 | HR 82 | Wt 219.6 lb

## 2021-04-30 DIAGNOSIS — I5020 Unspecified systolic (congestive) heart failure: Secondary | ICD-10-CM | POA: Diagnosis not present

## 2021-04-30 NOTE — Patient Instructions (Addendum)
It was nice to see you today!  Your goal blood pressure is less than 130/80 mmHg.   Medication Changes: DECREASE losartan to 1/2 tablet (12.5 mg) daily in the evening  DECREASE spironolactone to 1/2 tablet (12.5 mg daily) in the morning  Continue Jardiance 10 mg daily and metoprolol succinate 25 mg daily  Monitor blood pressure at home daily and keep a log (on your phone or piece of paper) to bring with you to your next visit. Write down date, time, blood pressure and pulse. Please bring your blood pressure machine to this visit.   Keep up the good work with diet and exercise. Aim for a diet full of vegetables, fruit and lean meats (chicken, [place], fish). Try to limit salt intake by eating fresh or frozen vegetables (instead of canned), rinse canned vegetables prior to cooking and do not add any additional salt to meals.

## 2021-05-01 ENCOUNTER — Telehealth: Payer: Self-pay | Admitting: Student-PharmD

## 2021-05-01 LAB — BASIC METABOLIC PANEL
BUN/Creatinine Ratio: 16 (ref 12–28)
BUN: 13 mg/dL (ref 8–27)
CO2: 19 mmol/L — ABNORMAL LOW (ref 20–29)
Calcium: 9.3 mg/dL (ref 8.7–10.3)
Chloride: 106 mmol/L (ref 96–106)
Creatinine, Ser: 0.79 mg/dL (ref 0.57–1.00)
Glucose: 85 mg/dL (ref 70–99)
Potassium: 4.1 mmol/L (ref 3.5–5.2)
Sodium: 142 mmol/L (ref 134–144)
eGFR: 83 mL/min/{1.73_m2} (ref >59)

## 2021-05-01 NOTE — Telephone Encounter (Signed)
Called and spoke with patient to let her know that her labs are stable from previous and we will continue our plan from yesterday's visit to decrease losartan to 12.5 mg daily and spironolactone to 12.5 mg daily and continue metoprolol succinate 25 mg daily and Jardiance 10 mg daily. Follow up scheduled for 05/21/21 for further medication titration.

## 2021-05-10 ENCOUNTER — Ambulatory Visit: Payer: Medicare Other | Admitting: Internal Medicine

## 2021-05-20 NOTE — Progress Notes (Signed)
Patient ID: Katrina [name]                 DOB: [birth year]                      MRN: [id number]     HPI: Katrina [name] is a 66 y.o. female referred by Dr. Ali [name] to pharmacy clinic for HF medication management. PMH is significant for dilated cardiomyopathy, left bundle branch block, tobacco abuse, and aortic atherosclerosis. Recent [facility] 04/26/21 revealed nonischemic cardiomyopathy. Most recent LVEF 35-40% on 04/12/21. At last visit with MD on 04/23/21, Jardiance, losartan, metoprolol succinate, and spironolactone were started. At last visit with pharmacy clinic on 04/30/21, patient reported dizziness and lightheadedness and pre-syncope so losartan and spironolactone doses were decreased. Labs after starting HF medications were stable.   Today, she returns to pharmacy clinic for further medication titration. Symptomatically, she is feeling okay but reports anxiety about her upcoming podiatry procedure on Monday. Has felt a little lightheadedness which seems to be related to her anxiety. Denies chest pain, SOB, or palpitations. Able to complete all ADLs. Activity level has been down lately due to feeling fatigued. She is not checking her weight at home but does have a scale. Denies LEE, PND, or orthopnea. Has not had much of an appetite lately but she adheres to a low-salt diet. Because her income increased slightly this year with the increases in social security, she no longer qualifies for food stamps. She has been adherent to her medications and has taken them today. Reports that spironolactone causes frequent urination throughout the day.   BP using home wrist cuff: 148/62, 58 BP using clinic cuff: 132/78, 92  Current CHF meds: metoprolol succinate 25 mg daily (PM), losartan 12.5 mg daily (PM), spironolactone 12.5 mg daily (AM), Jardiance 10 mg daily (AM) BP goal: <130/80 mmHg  Family History: heart disease, HLD, HTN in mother, DM in brother  Social History: current smoker  Diet: avoiding  foods high in salt  Exercise: limited by foot, takes care of husband who is in a wheelchair  Home BP readings: Uses wrist cuff. Compared to clinic cuff today (see above) - home cuff does not read accurately.  Log of readings and corresponding symptoms: felt fine with BP 147/73, 133/75, 125/85, 115/85, 128/77; "sick feeling" with BP 96/67, 123/76; lightheaded 125/78. She is not able to recall if these feelings correlated to moments of feeling anxiety or from feeling like BP was too low.   Labs:  -04/30/21: Scr 0.79, Na 142, K 4.1 (losartan 25 mg daily, spironolactone 25 mg daily, Jardiance 10 mg daily) -04/23/21: Scr 0.67, Na 141, K 4.3 (no HF medications)  Wt Readings from Last 3 Encounters:  04/30/21 219 lb 9.6 oz (99.6 kg)  04/26/21 220 lb (99.8 kg)  04/23/21 220 lb 9.6 oz (100.1 kg)   BP Readings from Last 3 Encounters:  04/30/21 132/80  04/26/21 123/69  04/23/21 (!) 132/92   Pulse Readings from Last 3 Encounters:  04/30/21 82  04/26/21 91  04/23/21 99    Renal function: CrCl cannot be calculated (Unknown ideal weight.).  Past Medical History:  Diagnosis Date   Anxiety    Arthritis    knees, hips, elbows    Asthma    hx of asthma 3/12- hospitalized    COPD (chronic obstructive pulmonary disease) (HCC)    Depression    GERD (gastroesophageal reflux disease)    PRIOR TO HAVING GALLBLADDER REMOVED   Headache  from her neck   Left bundle branch block 06/28/2010   Lung infection 2015   Lung infection     Current Outpatient Medications on File Prior to Visit  Medication Sig Dispense Refill   acetaminophen (TYLENOL) 500 MG tablet Take 1,000 mg by mouth every 6 (six) hours as needed for moderate pain.     aspirin EC 81 MG tablet Take 1 tablet (81 mg total) by mouth daily. Swallow whole. 90 tablet 3   atorvastatin (LIPITOR) 40 MG tablet Take 1 tablet (40 mg total) by mouth daily. 90 tablet 3   Budeson-Glycopyrrol-Formoterol (BREZTRI AEROSPHERE) 160-9-4.8 MCG/ACT AERO  Inhale 2 puffs into the lungs in the morning and at bedtime. 10.7 g 3   empagliflozin (JARDIANCE) 10 MG TABS tablet Take 1 tablet (10 mg total) by mouth daily before breakfast. 30 tablet 1   losartan (COZAAR) 25 MG tablet Take 0.5 tablets (12.5 mg total) by mouth at bedtime. 90 tablet 3   metoprolol succinate (TOPROL XL) 25 MG 24 hr tablet Take 1 tablet (25 mg total) by mouth at bedtime. 90 tablet 1   Multiple Vitamin (MULTIVITAMIN WITH MINERALS) TABS tablet Take 2 tablets by mouth daily.     spironolactone (ALDACTONE) 25 MG tablet Take 0.5 tablets (12.5 mg total) by mouth every morning. 90 tablet 3   [DISCONTINUED] clonazePAM (KLONOPIN) 0.5 MG tablet Take 1 tablet (0.5 mg total) by mouth 2 (two) times daily as needed for anxiety. 30 tablet 0   No current facility-administered medications on file prior to visit.    Allergies  Allergen Reactions   Codeine Itching and Nausea Only    Assessment/Plan:  1. CHF - BP close to goal goal <130/80 mmHg in clinic today. Down 4 lbs from last visit and euvolemic on exam. Given her upcoming procedure on Monday, will adjust medication today that does not require close lab monitoring and does not have a large effect on BP. Room in BP and HR to increase beta blocker. Will increase metoprolol succinate to 50 mg daily. Continue losartan 12.5 mg daily, spironolactone 12.5 mg daily, Jardiance 10 mg daily. Continue checking BP at home. Home wrist cuff does not read accurately with clinic cuff so encouraged patient to check BP using a bicep cuff. Follow up visit in 1 month for further CHF medication titration.   Katrina [name], PharmD PGY2 Ambulatory Care Pharmacy Resident 05/20/2021 11:29 AM

## 2021-05-21 ENCOUNTER — Ambulatory Visit (INDEPENDENT_AMBULATORY_CARE_PROVIDER_SITE_OTHER): Payer: Medicare Other | Admitting: Student-PharmD

## 2021-05-21 ENCOUNTER — Other Ambulatory Visit: Payer: Self-pay

## 2021-05-21 VITALS — BP 132/78 | HR 92 | Wt 216.0 lb

## 2021-05-21 DIAGNOSIS — I5020 Unspecified systolic (congestive) heart failure: Secondary | ICD-10-CM | POA: Insufficient documentation

## 2021-05-21 DIAGNOSIS — I5022 Chronic systolic (congestive) heart failure: Secondary | ICD-10-CM | POA: Insufficient documentation

## 2021-05-21 MED ORDER — METOPROLOL SUCCINATE ER 50 MG PO TB24: 50.0000 mg | ORAL_TABLET | Freq: Every day | ORAL | 11 refills | Status: DC

## 2021-05-21 NOTE — Patient Instructions (Signed)
It was nice to see you today!  Your goal blood pressure is less than 130/80 mmHg.   Medication Changes: Increase metoprolol succinate to 50 mg daily. You can take two of your 25 mg tablets in the meantime. When you pick up your new prescription just take one tablet.   Continue losartan 12.5 mg (1/2 tablet) daily, spironolactone 12.5 mg (1/2 tablet) daily, and Jardiance 10 mg (1 tablet) daily  Monitor blood pressure at home daily and keep a log (on your phone or piece of paper) to bring with you to your next visit. Write down date, time, blood pressure and pulse.  Keep up the good work with diet and exercise. Aim for a diet full of vegetables, fruit and lean meats (chicken, [place], fish). Try to limit salt intake by eating fresh or frozen vegetables (instead of canned), rinse canned vegetables prior to cooking and do not add any additional salt to meals.

## 2021-05-27 ENCOUNTER — Other Ambulatory Visit: Payer: Self-pay | Admitting: Podiatry

## 2021-05-27 ENCOUNTER — Encounter: Payer: Self-pay | Admitting: Podiatry

## 2021-05-27 DIAGNOSIS — M2012 Hallux valgus (acquired), left foot: Secondary | ICD-10-CM | POA: Diagnosis not present

## 2021-05-27 DIAGNOSIS — M2042 Other hammer toe(s) (acquired), left foot: Secondary | ICD-10-CM | POA: Diagnosis not present

## 2021-05-27 DIAGNOSIS — M21612 Bunion of left foot: Secondary | ICD-10-CM | POA: Diagnosis not present

## 2021-05-27 DIAGNOSIS — M205X2 Other deformities of toe(s) (acquired), left foot: Secondary | ICD-10-CM | POA: Diagnosis not present

## 2021-05-27 DIAGNOSIS — M25572 Pain in left ankle and joints of left foot: Secondary | ICD-10-CM | POA: Diagnosis not present

## 2021-05-27 DIAGNOSIS — M2041 Other hammer toe(s) (acquired), right foot: Secondary | ICD-10-CM | POA: Diagnosis not present

## 2021-05-27 MED ORDER — OXYCODONE-ACETAMINOPHEN 5-325 MG PO TABS
1.0000 | ORAL_TABLET | ORAL | 0 refills | Status: DC | PRN
Start: 2021-05-27 — End: 2021-06-05

## 2021-05-27 MED ORDER — IBUPROFEN 800 MG PO TABS: 800.0000 mg | ORAL_TABLET | Freq: Four times a day (QID) | ORAL | 1 refills | Status: DC | PRN

## 2021-05-30 ENCOUNTER — Telehealth: Payer: Self-pay | Admitting: *Deleted

## 2021-05-30 MED ORDER — ONDANSETRON HCL 4 MG PO TABS: 4.0000 mg | ORAL_TABLET | Freq: Three times a day (TID) | ORAL | 0 refills | Status: DC | PRN

## 2021-05-30 NOTE — Telephone Encounter (Signed)
Called patient and spoke with husband giving notification that medicine has been sent to pharmacy on file.

## 2021-05-30 NOTE — Telephone Encounter (Signed)
Patient is calling to request nausea medicine, recently had surgery on Monday. Please advise.

## 2021-06-05 ENCOUNTER — Ambulatory Visit (INDEPENDENT_AMBULATORY_CARE_PROVIDER_SITE_OTHER): Payer: Medicare Other | Admitting: Podiatry

## 2021-06-05 ENCOUNTER — Other Ambulatory Visit: Payer: Self-pay

## 2021-06-05 ENCOUNTER — Ambulatory Visit (INDEPENDENT_AMBULATORY_CARE_PROVIDER_SITE_OTHER): Payer: Medicare Other

## 2021-06-05 DIAGNOSIS — M2012 Hallux valgus (acquired), left foot: Secondary | ICD-10-CM | POA: Diagnosis not present

## 2021-06-05 DIAGNOSIS — Z9889 Other specified postprocedural states: Secondary | ICD-10-CM

## 2021-06-05 MED ORDER — DOXYCYCLINE HYCLATE 100 MG PO TABS: 100.0000 mg | ORAL_TABLET | Freq: Two times a day (BID) | ORAL | 0 refills | Status: AC

## 2021-06-05 MED ORDER — OXYCODONE-ACETAMINOPHEN 5-325 MG PO TABS: 1.0000 | ORAL_TABLET | ORAL | 0 refills | Status: DC | PRN

## 2021-06-05 MED ORDER — IBUPROFEN 800 MG PO TABS: 800.0000 mg | ORAL_TABLET | Freq: Four times a day (QID) | ORAL | 1 refills | Status: DC | PRN

## 2021-06-12 ENCOUNTER — Ambulatory Visit (INDEPENDENT_AMBULATORY_CARE_PROVIDER_SITE_OTHER): Payer: Medicare Other | Admitting: Podiatry

## 2021-06-12 ENCOUNTER — Other Ambulatory Visit: Payer: Self-pay

## 2021-06-12 ENCOUNTER — Ambulatory Visit
Admission: RE | Admit: 2021-06-12 | Discharge: 2021-06-12 | Disposition: A | Discharge status: Home / Self Care | Payer: Medicare Other | Source: Ambulatory Visit | Attending: Pulmonary Disease | Admitting: Pulmonary Disease

## 2021-06-12 ENCOUNTER — Encounter: Payer: Self-pay | Admitting: Podiatry

## 2021-06-12 DIAGNOSIS — Z720 Tobacco use: Secondary | ICD-10-CM

## 2021-06-12 DIAGNOSIS — R918 Other nonspecific abnormal finding of lung field: Secondary | ICD-10-CM

## 2021-06-12 DIAGNOSIS — I700 Atherosclerosis of aorta: Secondary | ICD-10-CM | POA: Diagnosis not present

## 2021-06-12 DIAGNOSIS — J438 Other emphysema: Secondary | ICD-10-CM

## 2021-06-12 DIAGNOSIS — J432 Centrilobular emphysema: Secondary | ICD-10-CM

## 2021-06-12 DIAGNOSIS — M2012 Hallux valgus (acquired), left foot: Secondary | ICD-10-CM

## 2021-06-12 DIAGNOSIS — Z9889 Other specified postprocedural states: Secondary | ICD-10-CM | POA: Diagnosis not present

## 2021-06-12 DIAGNOSIS — R911 Solitary pulmonary nodule: Secondary | ICD-10-CM | POA: Diagnosis not present

## 2021-06-12 DIAGNOSIS — J439 Emphysema, unspecified: Secondary | ICD-10-CM | POA: Diagnosis not present

## 2021-06-12 MED ORDER — ONDANSETRON HCL 4 MG PO TABS: 4.0000 mg | ORAL_TABLET | Freq: Three times a day (TID) | ORAL | 0 refills | Status: DC | PRN

## 2021-06-12 MED ORDER — OXYCODONE-ACETAMINOPHEN 5-325 MG PO TABS: 1.0000 | ORAL_TABLET | ORAL | 0 refills | Status: DC | PRN

## 2021-06-12 NOTE — Progress Notes (Signed)
?Subjective:  ?Patient ID: Katrina [name], female    DOB: 01/12/1956,  MRN: [id number] ? ?Chief Complaint  ?Patient presents with  ? Routine Post Op  ?  POV #1 DOS 05/27/21 -  LEFT FOOT BUNIONECTOMY WITH POSSIBLE PHALANGEAL OSTEOTOMY WITH FIXATION  ? ? ?DOS: 05/27/2021 ?Procedure: Left bunionectomy with phalangeal osteotomy ? ?66 y.o. female returns for post-op check.  Patient states she is doing well.  She is ambulating with a cam boot on.  She does not think she did a lot on the boot.  Her bandages are clean dry and intact.  Minimal pain. ? ?Review of Systems: Negative except as noted in the HPI. Denies N/V/F/Ch. ? ?Past Medical History:  ?Diagnosis Date  ? Anxiety   ? Arthritis   ? knees, hips, elbows   ? Asthma   ? hx of asthma 3/12- hospitalized   ? COPD (chronic obstructive pulmonary disease) ([place])   ? Depression   ? GERD (gastroesophageal reflux disease)   ? PRIOR TO HAVING GALLBLADDER REMOVED  ? Headache   ? from her neck  ? Left bundle branch block 06/28/2010  ? Lung infection 2015  ? Lung infection   ? ? ?Current Outpatient Medications:  ?  doxycycline (VIBRA-TABS) 100 MG tablet, Take 1 tablet (100 mg total) by mouth 2 (two) times daily for 10 days., Disp: 20 tablet, Rfl: 0 ?  acetaminophen (TYLENOL) 500 MG tablet, Take 1,000 mg by mouth every 6 (six) hours as needed for moderate pain., Disp: , Rfl:  ?  aspirin EC 81 MG tablet, Take 1 tablet (81 mg total) by mouth daily. Swallow whole., Disp: 90 tablet, Rfl: 3 ?  atorvastatin (LIPITOR) 40 MG tablet, Take 1 tablet (40 mg total) by mouth daily., Disp: 90 tablet, Rfl: 3 ?  Budeson-Glycopyrrol-Formoterol (BREZTRI AEROSPHERE) 160-9-4.8 MCG/ACT AERO, Inhale 2 puffs into the lungs in the morning and at bedtime., Disp: 10.7 g, Rfl: 3 ?  empagliflozin (JARDIANCE) 10 MG TABS tablet, Take 1 tablet (10 mg total) by mouth daily before breakfast., Disp: 30 tablet, Rfl: 1 ?  losartan (COZAAR) 25 MG tablet, Take 0.5 tablets (12.5 mg total) by mouth at bedtime., Disp: 90  tablet, Rfl: 3 ?  metoprolol succinate (TOPROL XL) 50 MG 24 hr tablet, Take 1 tablet (50 mg total) by mouth at bedtime., Disp: 30 tablet, Rfl: 11 ?  Multiple Vitamin (MULTIVITAMIN WITH MINERALS) TABS tablet, Take 2 tablets by mouth daily., Disp: , Rfl:  ?  ondansetron (ZOFRAN) 4 MG tablet, Take 1 tablet (4 mg total) by mouth every 8 (eight) hours as needed for nausea or vomiting., Disp: 20 tablet, Rfl: 0 ?  oxyCODONE-acetaminophen (PERCOCET) 5-325 MG tablet, Take 1 tablet by mouth every 4 (four) hours as needed for severe pain., Disp: 30 tablet, Rfl: 0 ?  spironolactone (ALDACTONE) 25 MG tablet, Take 0.5 tablets (12.5 mg total) by mouth every morning., Disp: 90 tablet, Rfl: 3 ? ?Social History  ? ?Tobacco Use  ?Smoking Status Every Day  ? Packs/day: 1.00  ? Years: 25.00  ? Pack years: 25.00  ? Types: Cigarettes  ?Smokeless Tobacco Never  ? ? ?Allergies  ?Allergen Reactions  ? Codeine Itching and Nausea Only  ? ?Objective:  ?There were no vitals filed for this visit. ?There is no height or weight on file to calculate BMI. ?Constitutional Well developed. ?Well nourished.  ?Vascular Foot warm and well perfused. ?Capillary refill normal to all digits.   ?Neurologic Normal speech. ?Oriented to person, place, and time. ?  Epicritic sensation to light touch grossly present bilaterally.  ?Dermatologic Skin healing well without signs of infection. Skin edges well coapted without signs of infection.  ?Orthopedic: Tenderness to palpation noted about the surgical site.  ? ?Radiographs: 3 views of skeletally mature adult left foot: Proximal part of the fragment is lifting up.  There is still screw that is intact.They can stable seems to be within normal limits.  The alignment still appears to be holding well.  We will continue to clinically monitor ?Assessment:  ? ?1. Hallux abducto valgus, left   ?2. Status post foot surgery   ? ?Plan:  ?Patient was evaluated and treated and all questions answered. ? ?S/p foot surgery  left ?-Progressing as expected post-operatively. ?-XR: See above ?-WB Status: Partial weightbearing to the heel left lower extremity ?-Sutures: Intact.  No clinical signs of dehiscence noted.  Mild erythema noted around the incision site. ?-Medications: Percocet was refilled.  Doxycycline was sent for skin and soft tissue prophylaxis ?-Foot redressed. ?-I discussed with her that the proximal fragment is lifting up a little bit likely either due to screw failure or too much weightbearing.  I discussed with her that if we lose too much correction we may have to revisit the surgical site to be fixated.  I discussed this with her in extensive detail she states understanding. ? ?No follow-ups on file.  ?

## 2021-06-12 NOTE — Progress Notes (Signed)
?Subjective:  ?Patient ID: Katrina [name], female    DOB: 1956/02/17,  MRN: [id number] ? ?Chief Complaint  ?Patient presents with  ? Routine Post Op  ?   POV #2 DOS 05/27/21 -  LEFT FOOT BUNIONECTOMY WITH POSSIBLE PHALANGEAL OSTEOTOMY WITH FIXATION  ? ? ?DOS: 05/27/2021 ?Procedure: Left bunionectomy with phalangeal osteotomy ? ?66 y.o. female returns for post-op check.  Patient states she is doing well.  She is ambulating with a cam boot on.  She does not think she did a lot on the boot.  Her bandages are clean dry and intact.  Minimal pain. ? ?Review of Systems: Negative except as noted in the HPI. Denies N/V/F/Ch. ? ?Past Medical History:  ?Diagnosis Date  ? Anxiety   ? Arthritis   ? knees, hips, elbows   ? Asthma   ? hx of asthma 3/12- hospitalized   ? COPD (chronic obstructive pulmonary disease) ([place])   ? Depression   ? GERD (gastroesophageal reflux disease)   ? PRIOR TO HAVING GALLBLADDER REMOVED  ? Headache   ? from her neck  ? Left bundle branch block 06/28/2010  ? Lung infection 2015  ? Lung infection   ? ? ?Current Outpatient Medications:  ?  ondansetron (ZOFRAN) 4 MG tablet, Take 1 tablet (4 mg total) by mouth every 8 (eight) hours as needed for nausea or vomiting., Disp: 20 tablet, Rfl: 0 ?  oxyCODONE-acetaminophen (PERCOCET) 5-325 MG tablet, Take 1 tablet by mouth every 4 (four) hours as needed for severe pain., Disp: 30 tablet, Rfl: 0 ?  acetaminophen (TYLENOL) 500 MG tablet, Take 1,000 mg by mouth every 6 (six) hours as needed for moderate pain., Disp: , Rfl:  ?  aspirin EC 81 MG tablet, Take 1 tablet (81 mg total) by mouth daily. Swallow whole., Disp: 90 tablet, Rfl: 3 ?  atorvastatin (LIPITOR) 40 MG tablet, Take 1 tablet (40 mg total) by mouth daily., Disp: 90 tablet, Rfl: 3 ?  Budeson-Glycopyrrol-Formoterol (BREZTRI AEROSPHERE) 160-9-4.8 MCG/ACT AERO, Inhale 2 puffs into the lungs in the morning and at bedtime., Disp: 10.7 g, Rfl: 3 ?  doxycycline (VIBRA-TABS) 100 MG tablet, Take 1 tablet (100 mg  total) by mouth 2 (two) times daily for 10 days., Disp: 20 tablet, Rfl: 0 ?  empagliflozin (JARDIANCE) 10 MG TABS tablet, Take 1 tablet (10 mg total) by mouth daily before breakfast., Disp: 30 tablet, Rfl: 1 ?  losartan (COZAAR) 25 MG tablet, Take 0.5 tablets (12.5 mg total) by mouth at bedtime., Disp: 90 tablet, Rfl: 3 ?  metoprolol succinate (TOPROL XL) 50 MG 24 hr tablet, Take 1 tablet (50 mg total) by mouth at bedtime., Disp: 30 tablet, Rfl: 11 ?  Multiple Vitamin (MULTIVITAMIN WITH MINERALS) TABS tablet, Take 2 tablets by mouth daily., Disp: , Rfl:  ?  ondansetron (ZOFRAN) 4 MG tablet, Take 1 tablet (4 mg total) by mouth every 8 (eight) hours as needed for nausea or vomiting., Disp: 20 tablet, Rfl: 0 ?  oxyCODONE-acetaminophen (PERCOCET) 5-325 MG tablet, Take 1 tablet by mouth every 4 (four) hours as needed for severe pain., Disp: 30 tablet, Rfl: 0 ?  spironolactone (ALDACTONE) 25 MG tablet, Take 0.5 tablets (12.5 mg total) by mouth every morning., Disp: 90 tablet, Rfl: 3 ? ?Social History  ? ?Tobacco Use  ?Smoking Status Every Day  ? Packs/day: 1.00  ? Years: 25.00  ? Pack years: 25.00  ? Types: Cigarettes  ?Smokeless Tobacco Never  ? ? ?Allergies  ?Allergen Reactions  ?  Codeine Itching and Nausea Only  ? ?Objective:  ?There were no vitals filed for this visit. ?There is no height or weight on file to calculate BMI. ?Constitutional Well developed. ?Well nourished.  ?Vascular Foot warm and well perfused. ?Capillary refill normal to all digits.   ?Neurologic Normal speech. ?Oriented to person, place, and time. ?Epicritic sensation to light touch grossly present bilaterally.  ?Dermatologic Skin completely reepithelialized.  No clinical signs of infection noted no dehiscence noted.  ?Orthopedic: Now tenderness to palpation noted about the surgical site.  ? ?Radiographs: 3 views of skeletally mature adult left foot: Proximal part of the fragment is lifting up.  There is still screw that is intact.They can stable  seems to be within normal limits.  The alignment still appears to be holding well.  We will continue to clinically monitor ?Assessment:  ? ?1. Hallux abducto valgus, left   ?2. Status post foot surgery   ? ? ?Plan:  ?Patient was evaluated and treated and all questions answered. ? ?S/p foot surgery left ?-Progressing as expected post-operatively. ?-XR: See above ?-WB Status: Partial weightbearing to the heel with a surgical shoe. ?-Sutures: Removed no clinical signs of dehiscence noted.  Mild erythema noted around the incision site. ?-Medications: Percocet was refilled.   ?-I discussed with her that the proximal fragment is lifting up a little bit likely either due to screw failure or too much weightbearing.  I discussed with her that if we lose too much correction we may have to revisit the surgical site to be fixated.  I discussed this with her in extensive detail she states understanding. ? ?No follow-ups on file.  ?

## 2021-06-15 ENCOUNTER — Other Ambulatory Visit: Payer: Self-pay | Admitting: Internal Medicine

## 2021-06-17 MED ORDER — EMPAGLIFLOZIN 10 MG PO TABS: 10.0000 mg | ORAL_TABLET | Freq: Every day | ORAL | 3 refills | Status: DC

## 2021-06-19 ENCOUNTER — Encounter: Payer: Medicare Other | Admitting: Podiatry

## 2021-06-24 NOTE — Progress Notes (Signed)
Patient ID: TARAN HABLE                 DOB: 08-27-1955                      MRN: [id number] ? ? ? ? ?HPI: ?Katrina [name] is a 66 y.o. female referred by Dr. Ali Lowe to pharmacy clinic for HF medication management. PMH is significant for dilated cardiomyopathy, left bundle branch block, tobacco abuse, and aortic atherosclerosis. Recent [facility] 04/26/21 revealed nonischemic cardiomyopathy. Most recent LVEF 35-40% on 04/12/21. At last visit with MD on 04/23/21, Jardiance, losartan, metoprolol succinate, and spironolactone were started. At initial pharmacist visit 04/30/21, patient reported dizziness and pre-syncope so losartan and spironolactone doses were decreased. Labs after starting HF medications were stable. At last pharmacist visit 05/21/21, increased metoprolol succinate to 50 mg daily.  ? ?Today, she is accompanied by her daughter who helps manage her medications. Reports adherence to medications and has taken her medications this morning. Patient reports that her husband has been in the hospital since the beginning of the month which has affected her appetite, sleep, and stress levels. Reports occasional lightheadedness but no dizziness or syncope. Reports lightheadedness could be from BP, BG, or anxiety. Not checking BP regularly due to stress of husband's hospitalization. Her scale is at her mother's house so she has not been checking her weight regularly. Denies SOB, chest pain, LEE, PND, orthopnea. Weight is up ~4 lbs since last visit but she is walking in a boot today. No LEE on exam.  ? ?Current CHF meds: metoprolol succinate 50 mg daily (PM), losartan 12.5 mg daily (PM), spironolactone 12.5 mg daily (AM), Jardiance 10 mg daily (AM) ?BP goal: <130/80 mmHg ? ?Family History: heart disease, HLD, HTN in mother, DM in brother ? ?Social History: current smoker ? ?Diet: trying to avoid foods high in salt, appetite has been lower lately due to husband's hospitalization ? ?Exercise: limited by foot - walking  slowly in a boot today ? ?Home BP readings: Has a wrist cuff at home that has been validated with clinic cuff as not reading accurately. She has not been checking BP lately. ? ?Labs:  ?-04/30/21: Scr 0.79, Na 142, K 4.1 (losartan 25 mg daily, spironolactone 25 mg daily, Jardiance 10 mg daily) ?-04/23/21: Scr 0.67, Na 141, K 4.3 (no HF medications) ? ?Wt Readings from Last 3 Encounters:  ?06/25/21 219 lb 12.8 oz (99.7 kg)  ?05/21/21 216 lb (98 kg)  ?04/30/21 219 lb 9.6 oz (99.6 kg)  ? ?BP Readings from Last 3 Encounters:  ?06/25/21 122/76  ?05/21/21 132/78  ?04/30/21 132/80  ? ?Pulse Readings from Last 3 Encounters:  ?06/25/21 76  ?05/21/21 92  ?04/30/21 82  ? ? ?Renal function: ?CrCl cannot be calculated (Patient's most recent lab result is older than the maximum 21 days allowed.). ? ?Past Medical History:  ?Diagnosis Date  ? Anxiety   ? Arthritis   ? knees, hips, elbows   ? Asthma   ? hx of asthma 3/12- hospitalized   ? COPD (chronic obstructive pulmonary disease) ([place])   ? Depression   ? GERD (gastroesophageal reflux disease)   ? PRIOR TO HAVING GALLBLADDER REMOVED  ? Headache   ? from her neck  ? Left bundle branch block 06/28/2010  ? Lung infection 2015  ? Lung infection   ? ? ?Current Outpatient Medications on File Prior to Visit  ?Medication Sig Dispense Refill  ? acetaminophen (TYLENOL) 500 MG tablet  Take 1,000 mg by mouth every 6 (six) hours as needed for moderate pain.    ? aspirin EC 81 MG tablet Take 1 tablet (81 mg total) by mouth daily. Swallow whole. 90 tablet 3  ? atorvastatin (LIPITOR) 40 MG tablet Take 1 tablet (40 mg total) by mouth daily. 90 tablet 3  ? Budeson-Glycopyrrol-Formoterol (BREZTRI AEROSPHERE) 160-9-4.8 MCG/ACT AERO Inhale 2 puffs into the lungs in the morning and at bedtime. 10.7 g 3  ? empagliflozin (JARDIANCE) 10 MG TABS tablet Take 1 tablet (10 mg total) by mouth daily before breakfast. 90 tablet 3  ? metoprolol succinate (TOPROL XL) 50 MG 24 hr tablet Take 1 tablet (50 mg total) by  mouth at bedtime. 30 tablet 11  ? Multiple Vitamin (MULTIVITAMIN WITH MINERALS) TABS tablet Take 2 tablets by mouth daily.    ? ondansetron (ZOFRAN) 4 MG tablet Take 1 tablet (4 mg total) by mouth every 8 (eight) hours as needed for nausea or vomiting. 20 tablet 0  ? ondansetron (ZOFRAN) 4 MG tablet Take 1 tablet (4 mg total) by mouth every 8 (eight) hours as needed for nausea or vomiting. 20 tablet 0  ? oxyCODONE-acetaminophen (PERCOCET) 5-325 MG tablet Take 1 tablet by mouth every 4 (four) hours as needed for severe pain. 30 tablet 0  ? oxyCODONE-acetaminophen (PERCOCET) 5-325 MG tablet Take 1 tablet by mouth every 4 (four) hours as needed for severe pain. 30 tablet 0  ? spironolactone (ALDACTONE) 25 MG tablet Take 0.5 tablets (12.5 mg total) by mouth every morning. 90 tablet 3  ? [DISCONTINUED] clonazePAM (KLONOPIN) 0.5 MG tablet Take 1 tablet (0.5 mg total) by mouth 2 (two) times daily as needed for anxiety. 30 tablet 0  ? ?No current facility-administered medications on file prior to visit.  ? ? ?Allergies  ?Allergen Reactions  ? Codeine Itching and Nausea Only  ? ? ?Assessment/Plan: ? ?1. CHF - BP at goal <130/80 mmHg in clinic today. Weight is up 4 lbs from last visit but patient is in a boot today due to recent podiatry procedure. Euvolemic on exam. Room in BP to increase ARB. Will increase losartan to 25 mg daily and recheck BMET on 07/16/21 to coordinate with appt for echo. Continue spironolactone 12.5 mg daily, metoprolol succinate 50 mg daily, and Jardiance 10 mg daily. Encouraged her to purchase a new upper arm cuff since her wrist cuff is not accurate and to start checking a few times a week and if she feels lightheaded to see if it is related to her BP or to something else like diabetes or anxiety. Next visit with Dr. Ali Lowe on 07/26/21. Follow up with pharmacy clinic as needed after that visit.  ? ?Rebbeca Paul, PharmD ?PGY2 Ambulatory Care Pharmacy Resident ?06/25/2021 10:43 AM ? ?

## 2021-06-25 ENCOUNTER — Ambulatory Visit: Payer: Medicare Other | Admitting: Student-PharmD

## 2021-06-25 ENCOUNTER — Other Ambulatory Visit: Payer: Self-pay

## 2021-06-25 VITALS — BP 122/76 | HR 76 | Wt 219.8 lb

## 2021-06-25 DIAGNOSIS — I5020 Unspecified systolic (congestive) heart failure: Secondary | ICD-10-CM

## 2021-06-25 DIAGNOSIS — I5022 Chronic systolic (congestive) heart failure: Secondary | ICD-10-CM | POA: Diagnosis not present

## 2021-06-25 MED ORDER — LOSARTAN POTASSIUM 25 MG PO TABS: 25.0000 mg | ORAL_TABLET | Freq: Every day | ORAL | 11 refills | Status: DC

## 2021-06-25 NOTE — Patient Instructions (Addendum)
It was nice to see you today! ? ?Your goal blood pressure is less than 130/80 mmHg. In clinic, your blood pressure was 122/76 mmHg. ? ?Medication Changes: ?Increase losartan to 25 mg (1 tablet) daily.  ? ?Continue spironolactone 12.5 (1/2 tablet) daily, metoprolol succinate 50 mg (1 tablet) daily, and Jardiance 10 mg (1 tablet) daily.  ? ?We will recheck your labs on April 11th (the same day as your echo).  ? ?Next follow up visit with Dr. Ali Lowe on April 21st.  ? ?Monitor blood pressure at home daily and keep a log (on your phone or piece of paper) to bring with you to your next visit. Write down date, time, blood pressure and pulse. ? ?Keep up the good work with diet and exercise. Aim for a diet full of vegetables, fruit and lean meats (chicken, [place], fish). Try to limit salt intake by eating fresh or frozen vegetables (instead of canned), rinse canned vegetables prior to cooking and do not add any additional salt to meals.  ? ?

## 2021-07-01 ENCOUNTER — Encounter: Payer: Self-pay | Admitting: Pulmonary Disease

## 2021-07-01 ENCOUNTER — Ambulatory Visit (INDEPENDENT_AMBULATORY_CARE_PROVIDER_SITE_OTHER): Payer: Medicare Other | Admitting: Pulmonary Disease

## 2021-07-01 ENCOUNTER — Other Ambulatory Visit: Payer: Self-pay

## 2021-07-01 ENCOUNTER — Ambulatory Visit: Payer: Medicare Other | Admitting: Pulmonary Disease

## 2021-07-01 VITALS — BP 130/66 | HR 82 | Temp 97.8°F | Ht 67.0 in | Wt 218.0 lb

## 2021-07-01 DIAGNOSIS — J4520 Mild intermittent asthma, uncomplicated: Secondary | ICD-10-CM

## 2021-07-01 DIAGNOSIS — J432 Centrilobular emphysema: Secondary | ICD-10-CM

## 2021-07-01 DIAGNOSIS — J438 Other emphysema: Secondary | ICD-10-CM

## 2021-07-01 DIAGNOSIS — R918 Other nonspecific abnormal finding of lung field: Secondary | ICD-10-CM

## 2021-07-01 DIAGNOSIS — Z716 Tobacco abuse counseling: Secondary | ICD-10-CM | POA: Diagnosis not present

## 2021-07-01 DIAGNOSIS — Z720 Tobacco use: Secondary | ICD-10-CM

## 2021-07-01 LAB — PULMONARY FUNCTION TEST
DL/VA % pred: 108 %
DL/VA: 4.44 ml/min/mmHg/L
DLCO cor % pred: 100 %
DLCO cor: 22.04 ml/min/mmHg
DLCO unc % pred: 100 %
DLCO unc: 22.04 ml/min/mmHg
FEF 25-75 Post: 2.53 L/sec
FEF 25-75 Pre: 2.19 L/sec
FEF2575-%Change-Post: 15 %
FEF2575-%Pred-Post: 109 %
FEF2575-%Pred-Pre: 94 %
FEV1-%Change-Post: 4 %
FEV1-%Pred-Post: 102 %
FEV1-%Pred-Pre: 98 %
FEV1-Post: 2.78 L
FEV1-Pre: 2.67 L
FEV1FVC-%Change-Post: 0 %
FEV1FVC-%Pred-Pre: 96 %
FEV6-%Change-Post: 4 %
FEV6-%Pred-Post: 109 %
FEV6-%Pred-Pre: 105 %
FEV6-Post: 3.73 L
FEV6-Pre: 3.59 L
FEV6FVC-%Change-Post: 0 %
FEV6FVC-%Pred-Post: 103 %
FEV6FVC-%Pred-Pre: 103 %
FVC-%Change-Post: 3 %
FVC-%Pred-Post: 105 %
FVC-%Pred-Pre: 102 %
FVC-Post: 3.73 L
FVC-Pre: 3.61 L
Post FEV1/FVC ratio: 75 %
Post FEV6/FVC ratio: 100 %
Pre FEV1/FVC ratio: 74 %
Pre FEV6/FVC Ratio: 99 %
RV % pred: 118 %
RV: 2.64 L
TLC % pred: 111 %
TLC: 6.14 L

## 2021-07-01 MED ORDER — BREZTRI AEROSPHERE 160-9-4.8 MCG/ACT IN AERO
2.0000 | INHALATION_SPRAY | Freq: Two times a day (BID) | RESPIRATORY_TRACT | 11 refills | Status: AC
Start: 2021-07-01 — End: ?

## 2021-07-01 NOTE — Progress Notes (Signed)
? ?Synopsis: Referred in December 2022 for lung nodules by Ronnell Freshwater, NP ? ?Subjective:  ? ?PATIENT ID: Katrina [name] GENDER: female DOB: 1955/10/01, MRN: [id number] ? ?Chief Complaint  ?Patient presents with  ? Follow-up  ?  Follow up. Patient is here to go over pft results.   ? ? ?66 yo FM, PMH anxiety, asthma (~2 years), GERD, lung infection. Current everyday smoker.  Patient was referred today for evaluation of CT imaging.Patient had a lung cancer screening CT completed on 12/30/2020 this was read as a lung RADS 2.  Patient had several pulmonary nodules identified.  The largest of which was a nonsolid nodule within the anterior right lower lobe measuring about 8.2 mm in size.  She also has associated paraseptal and centrilobular emphysema.  Patient was diagnosed with asthma in the past.  Currently on Flovent and as needed albuterol.  Has been told that she had COPD in the past as well.  She is also being seen by cardiology.  Has plans for a ankle surgery. ? ?OV 07/01/2021: Here today for follow-up after pulmonary function test completed.  Patient had PFTs completed prior to office visit which showed normal ratio, normal FEV1 FVC, normal TLC no evidence of air trapping, normal DLCO.  Patient is breathing better on Breztri inhaler.  She likes using it much better than she did her previous inhalers.  She does have associated paraseptal and centrilobular emphysema on her CT.  But her lung function tests are reassuring.  She would like to stay on her current inhaler regimen as it has got rid of her cough and she is sleeping much better.  She has a lot of stress going on in her life.  She is trying to quit smoking and she is working on this.  Her husband is currently in the hospital needs surgery but his heart is too weak.  Also her daughter is going through some stuff.  So she is trying to help manage the family at this time.  She knows she needs to quit smoking and she is working on it. ? ? ?Past Medical  History:  ?Diagnosis Date  ? Anxiety   ? Arthritis   ? knees, hips, elbows   ? Asthma   ? hx of asthma 3/12- hospitalized   ? COPD (chronic obstructive pulmonary disease) ([place])   ? Depression   ? GERD (gastroesophageal reflux disease)   ? PRIOR TO HAVING GALLBLADDER REMOVED  ? Headache   ? from her neck  ? Left bundle branch block 06/28/2010  ? Lung infection 2015  ? Lung infection   ?  ? ?Family History  ?Problem Relation Age of Onset  ? Heart disease Mother   ? High Cholesterol Mother   ? High blood pressure Mother   ? Depression Brother   ? Diabetes Brother   ?  ? ?Past Surgical History:  ?Procedure Laterality Date  ? BACK SURGERY    ? x2  ? CARPAL TUNNEL RELEASE    ? left   ? CESAREAN SECTION    ? x 2  ? CHOLECYSTECTOMY    ? COLONOSCOPY  10/2017  ? DILATATION & CURETTAGE/HYSTEROSCOPY WITH MYOSURE N/A 12/10/2017  ? Procedure: DILATATION & CURETTAGE/HYSTEROSCOPY WITH MYOSURE;  Surgeon: Christophe Louis, MD;  Location: [facility];  Service: Gynecology;  Laterality: N/A;  POSSIBLE MYOMECTOMY VS POLYPECTOMY WITH MYOSURE  ? DILATION AND CURETTAGE OF UTERUS    ? ELBOW SURGERY    ? JOINT REPLACEMENT    ?  KNEE ARTHROSCOPY    ? bilateral x 2   ? KNEE ARTHROSCOPY Left 11/03/2013  ? Procedure: LEFT KNEE ARTHROSCOPY WITH DEBRIDEMENT, PARTIAL SYNOVECTOMY;  Surgeon: Mcarthur Rossetti, MD;  Location: [facility];  Service: Orthopedics;  Laterality: Left;  ? LAPAROSCOPY    ? left elbow surgery     ? OTHER SURGICAL HISTORY    ? arthroswcopic surgery right knee   ? OTHER SURGICAL HISTORY    ? arthroscopic surgery left knee x 2   ? OTHER SURGICAL HISTORY    ? bunionectomy right foot   ? OTHER SURGICAL HISTORY    ? C Section x 2   ? OTHER SURGICAL HISTORY    ? carpal tunnel on left   ? POSTERIOR CERVICAL FUSION/FORAMINOTOMY N/A 04/02/2020  ? Procedure: RIGHT C5-6 FORAMINOTOMY;  Surgeon: Jessy Oto, MD;  Location: [facility];  Service: Orthopedics;  Laterality: N/A;  ? RADIOLOGY WITH ANESTHESIA N/A 07/28/2019  ? Procedure: MRI WITH ANESTHESIA OF  NECK SOFT TISSUE ONLY WITH AND WITHOUT CONTRAST;  Surgeon: Radiologist, Medication, MD;  Location: [facility];  Service: Radiology;  Laterality: N/A;  ? RADIOLOGY WITH ANESTHESIA N/A 10/04/2019  ? Procedure: MRI WITH ANESTHESIA RIGHT SHOULDER WITHOUT CONTRAST,MRI OF CERVICAL SPINE WITHOUT CONTRAST;  Surgeon: Radiologist, Medication, MD;  Location: [facility];  Service: Radiology;  Laterality: N/A;  ? RADIOLOGY WITH ANESTHESIA N/A 08/30/2020  ? Procedure: MRI WITH ANESTHESIA CERVICAL SPINE WITHOUT CONTRAST;  Surgeon: Radiologist, Medication, MD;  Location: [facility];  Service: Radiology;  Laterality: N/A;  ? right foot bunionectomy     ? RIGHT/LEFT HEART CATH AND CORONARY ANGIOGRAPHY N/A 04/26/2021  ? Procedure: RIGHT/LEFT HEART CATH AND CORONARY ANGIOGRAPHY;  Surgeon: Early Osmond, MD;  Location: [facility];  Service: Cardiovascular;  Laterality: N/A;  ? ROBOTIC ASSISTED TOTAL HYSTERECTOMY WITH BILATERAL SALPINGO OOPHERECTOMY Bilateral 06/16/2018  ? Procedure: XI ROBOTIC ASSISTED TOTAL HYSTERECTOMY WITH RIGHT SALPINGO OOPHORECTOMY;  Surgeon: Christophe Louis, MD;  Location[facility];  Service: Gynecology;  Laterality: Bilateral;  ? TOTAL HIP ARTHROPLASTY  05/02/2011  ? Procedure: TOTAL HIP ARTHROPLASTY ANTERIOR APPROACH;  Surgeon: Mcarthur Rossetti, MD;  Location: [facility];  Service: Orthopedics;  Laterality: Left;  Left Total Hip Arthroplasty, Direct Anterior Approach  ? TOTAL KNEE ARTHROPLASTY Right 12/03/2012  ? Procedure: RIGHT TOTAL KNEE ARTHROPLASTY;  Surgeon: Mcarthur Rossetti, MD;  Location: [facility];  Service: Orthopedics;  Laterality: Right;  ? TOTAL KNEE ARTHROPLASTY Left 12/15/2014  ? Procedure: LEFT TOTAL KNEE ARTHROPLASTY;  Surgeon: Mcarthur Rossetti, MD;  Location: [facility];  Service: Orthopedics;  Laterality: Left;  ? TUBAL LIGATION    ? ULNAR NERVE TRANSPOSITION    ? left   ? UPPER GI ENDOSCOPY    ? x 2  ? ? ?Social History  ? ?Socioeconomic History  ? Marital status: Married  ?  Spouse  name: Maudie Mercury  ? Number of children: 2  ? Years of education: GED  ? Highest education level: Not on file  ?Occupational History  ? Occupation: disabled  ?Tobacco Use  ? Smoking status: Every Day  ?  Packs/day: 1.00  ?  Years: 25.00  ?  Pack years: 25.00  ?  Types: Cigarettes  ? Smokeless tobacco: Never  ?Vaping Use  ? Vaping Use: Never used  ?Substance and Sexual Activity  ? Alcohol use: Yes  ?  Alcohol/week: 49.0 standard drinks  ?  Types: 49 Cans of beer per week  ? Drug use: No  ?  Comment: hx of 34 years ago marijuana   ? Sexual activity: Not Currently  ?Other Topics Concern  ? Not on file  ?Social History Narrative  ? Not on file  ? ?Social Determinants of Health  ? ?Financial Resource Strain: Medium Risk  ? Difficulty of Paying Living Expenses: Somewhat hard  ?Food Insecurity: No Food Insecurity  ? Worried About Charity fundraiser in the Last Year: Never true  ? Ran Out of Food in the Last Year: Never true  ?Transportation Needs: No Transportation Needs  ? Lack of Transportation (Medical): No  ? Lack of Transportation (Non-Medical): No  ?Physical Activity: Insufficiently Active  ? Days of Exercise per Week: 2 days  ? Minutes of Exercise per Session: 30 min  ?Stress: Stress Concern Present  ? Feeling of Stress : Very much  ?Social Connections: Moderately Isolated  ? Frequency of Communication with Friends and Family: More than three times a week  ? Frequency of Social Gatherings with Friends and Family: More than three times a week  ? Attends Religious Services: Never  ? Active Member of Clubs or Organizations: No  ? Attends Archivist Meetings: Never  ? Marital Status: Married  ?Intimate Partner Violence: Not At Risk  ? Fear of Current or Ex-Partner: No  ? Emotionally Abused: No  ? Physically Abused: No  ? Sexually Abused: No  ?  ? ?Allergies  ?Allergen Reactions  ? Codeine Itching and Nausea Only  ?  ? ?Outpatient Medications Prior to Visit  ?Medication Sig Dispense Refill  ? acetaminophen (TYLENOL)  500 MG tablet Take 1,000 mg by mouth every 6 (six) hours as needed for moderate pain.    ? aspirin EC 81 MG tablet Take 1 tablet (81 mg total) by mouth daily. Swallow whole. 90 tablet 3  ? atorvastatin (LI

## 2021-07-01 NOTE — Patient Instructions (Addendum)
Thank you for visiting Dr. Valeta Harms at [facility]. ?Today we recommend the following: ? ?Orders Placed This Encounter  ?Procedures  ? Ambulatory Referral for Lung Cancer Scre  ? ?Breztri inhaler refills, 90 day supply, refills for the next year  ? ?Return in about 1 year (around 07/02/2022) for with Eric Form, NP, or Dr. Valeta Harms. ? ? ? ?Please do your part to reduce the spread of COVID-19.  ? ?

## 2021-07-01 NOTE — Patient Instructions (Signed)
Full PFT performed today. °

## 2021-07-01 NOTE — Progress Notes (Signed)
Full PFT performed today. °

## 2021-07-10 ENCOUNTER — Ambulatory Visit (INDEPENDENT_AMBULATORY_CARE_PROVIDER_SITE_OTHER): Payer: Medicare Other | Admitting: Podiatry

## 2021-07-10 ENCOUNTER — Ambulatory Visit (INDEPENDENT_AMBULATORY_CARE_PROVIDER_SITE_OTHER): Payer: Medicare Other

## 2021-07-10 DIAGNOSIS — M2012 Hallux valgus (acquired), left foot: Secondary | ICD-10-CM | POA: Diagnosis not present

## 2021-07-10 DIAGNOSIS — Z9889 Other specified postprocedural states: Secondary | ICD-10-CM

## 2021-07-10 MED ORDER — OXYCODONE-ACETAMINOPHEN 5-325 MG PO TABS: 1.0000 | ORAL_TABLET | ORAL | 0 refills | Status: DC | PRN

## 2021-07-16 ENCOUNTER — Other Ambulatory Visit: Payer: Medicare Other | Admitting: *Deleted

## 2021-07-16 ENCOUNTER — Ambulatory Visit (HOSPITAL_COMMUNITY): Payer: Medicare Other | Attending: Cardiology

## 2021-07-16 DIAGNOSIS — Z720 Tobacco use: Secondary | ICD-10-CM | POA: Diagnosis not present

## 2021-07-16 DIAGNOSIS — I5020 Unspecified systolic (congestive) heart failure: Secondary | ICD-10-CM | POA: Insufficient documentation

## 2021-07-16 DIAGNOSIS — I420 Dilated cardiomyopathy: Secondary | ICD-10-CM | POA: Insufficient documentation

## 2021-07-16 DIAGNOSIS — R931 Abnormal findings on diagnostic imaging of heart and coronary circulation: Secondary | ICD-10-CM | POA: Insufficient documentation

## 2021-07-16 LAB — ECHOCARDIOGRAM COMPLETE
Area-P 1/2: 7.66 cm2
P 1/2 time: 432 msec
S' Lateral: 4.9 cm

## 2021-07-16 LAB — BASIC METABOLIC PANEL
BUN/Creatinine Ratio: 14 (ref 12–28)
BUN: 12 mg/dL (ref 8–27)
CO2: 21 mmol/L (ref 20–29)
Calcium: 9.4 mg/dL (ref 8.7–10.3)
Chloride: 104 mmol/L (ref 96–106)
Creatinine, Ser: 0.87 mg/dL (ref 0.57–1.00)
Glucose: 90 mg/dL (ref 70–99)
Potassium: 4.3 mmol/L (ref 3.5–5.2)
Sodium: 141 mmol/L (ref 134–144)
eGFR: 74 mL/min/{1.73_m2} (ref >59)

## 2021-07-16 NOTE — Progress Notes (Addendum)
?Subjective:  ?Patient ID: Katrina [name], female    DOB: 02-20-1956,  MRN: [id number] ? ?Chief Complaint  ?Patient presents with  ? Routine Post Op  ? ? ?DOS: 05/27/2021 ?Procedure: Left bunionectomy with phalangeal osteotomy ? ?66 y.o. female returns for post-op check.  Patient states she is doing well.  She is ambulating with a cam boot on.  She does not think she did a lot on the boot.  Her bandages are clean dry and intact.  Minimal pain. ? ?Review of Systems: Negative except as noted in the HPI. Denies N/V/F/Ch. ? ?Past Medical History:  ?Diagnosis Date  ? Anxiety   ? Arthritis   ? knees, hips, elbows   ? Asthma   ? hx of asthma 3/12- hospitalized   ? COPD (chronic obstructive pulmonary disease) ([place])   ? Depression   ? GERD (gastroesophageal reflux disease)   ? PRIOR TO HAVING GALLBLADDER REMOVED  ? Headache   ? from her neck  ? Left bundle branch block 06/28/2010  ? Lung infection 2015  ? Lung infection   ? ? ?Current Outpatient Medications:  ?  oxyCODONE-acetaminophen (PERCOCET) 5-325 MG tablet, Take 1 tablet by mouth every 4 (four) hours as needed for severe pain., Disp: 30 tablet, Rfl: 0 ?  acetaminophen (TYLENOL) 500 MG tablet, Take 1,000 mg by mouth every 6 (six) hours as needed for moderate pain., Disp: , Rfl:  ?  aspirin EC 81 MG tablet, Take 1 tablet (81 mg total) by mouth daily. Swallow whole., Disp: 90 tablet, Rfl: 3 ?  atorvastatin (LIPITOR) 40 MG tablet, Take 1 tablet (40 mg total) by mouth daily., Disp: 90 tablet, Rfl: 3 ?  Budeson-Glycopyrrol-Formoterol (BREZTRI AEROSPHERE) 160-9-4.8 MCG/ACT AERO, Inhale 2 puffs into the lungs in the morning and at bedtime., Disp: 10.7 g, Rfl: 11 ?  empagliflozin (JARDIANCE) 10 MG TABS tablet, Take 1 tablet (10 mg total) by mouth daily before breakfast., Disp: 90 tablet, Rfl: 3 ?  losartan (COZAAR) 25 MG tablet, Take 1 tablet (25 mg total) by mouth at bedtime., Disp: 30 tablet, Rfl: 11 ?  metoprolol succinate (TOPROL XL) 50 MG 24 hr tablet, Take 1 tablet (50  mg total) by mouth at bedtime., Disp: 30 tablet, Rfl: 11 ?  Multiple Vitamin (MULTIVITAMIN WITH MINERALS) TABS tablet, Take 2 tablets by mouth daily., Disp: , Rfl:  ?  ondansetron (ZOFRAN) 4 MG tablet, Take 1 tablet (4 mg total) by mouth every 8 (eight) hours as needed for nausea or vomiting., Disp: 20 tablet, Rfl: 0 ?  ondansetron (ZOFRAN) 4 MG tablet, Take 1 tablet (4 mg total) by mouth every 8 (eight) hours as needed for nausea or vomiting., Disp: 20 tablet, Rfl: 0 ?  oxyCODONE-acetaminophen (PERCOCET) 5-325 MG tablet, Take 1 tablet by mouth every 4 (four) hours as needed for severe pain., Disp: 30 tablet, Rfl: 0 ?  oxyCODONE-acetaminophen (PERCOCET) 5-325 MG tablet, Take 1 tablet by mouth every 4 (four) hours as needed for severe pain., Disp: 30 tablet, Rfl: 0 ?  spironolactone (ALDACTONE) 25 MG tablet, Take 0.5 tablets (12.5 mg total) by mouth every morning., Disp: 90 tablet, Rfl: 3 ? ?Social History  ? ?Tobacco Use  ?Smoking Status Every Day  ? Packs/day: 1.00  ? Years: 25.00  ? Pack years: 25.00  ? Types: Cigarettes  ?Smokeless Tobacco Never  ? ? ?Allergies  ?Allergen Reactions  ? Codeine Itching and Nausea Only  ? ?Objective:  ?There were no vitals filed for this visit. ?There is no  height or weight on file to calculate BMI. ?Constitutional Well developed. ?Well nourished.  ?Vascular Foot warm and well perfused. ?Capillary refill normal to all digits.   ?Neurologic Normal speech. ?Oriented to person, place, and time. ?Epicritic sensation to light touch grossly present bilaterally.  ?Dermatologic Skin completely reepithelialized.  No clinical signs of infection noted no dehiscence noted.  No recurrence of bunion clinically appreciated.  ?Orthopedic: Now tenderness to palpation noted about the surgical site.  ? ?Radiographs: 3 views of skeletally mature adult left foot: Proximal part of the fragment is lifting up.  There appears to be small shifting of the capital fragment.  However clinically no recurrence of  bunion noted.  We will continue to clinically monitor. ?Assessment:  ? ?1. Hallux abducto valgus, left   ?2. Status post foot surgery   ? ? ?Plan:  ?Patient was evaluated and treated and all questions answered. ? ?S/p foot surgery left ?-Progressing as expected post-operatively. ?-XR: See above ?-WB Status: Transition to regular shoes. ?-Sutures: Removed no clinical signs of dehiscence noted.  Mild erythema noted around the incision site. ?-Medications: Percocet was refilled.   ?-I discussed with her that the proximal fragment is lifting up a little bit likely either due to screw failure or too much weightbearing.  I discussed with her that if we lose too much correction we may have to revisit the surgical site to be fixated.  I discussed this with her in extensive detail she states understanding.  She would like to hold off on any revisional surgery.  We will can wait and watch ? ?No follow-ups on file.  ?

## 2021-07-23 NOTE — Progress Notes (Signed)
?Cardiology Office Note:   ? ?Date:  07/26/2021  ? ?ID:  Katrina [name], DOB 1955-11-19, MRN [id number] ? ?PCP:  Ronnell Freshwater, NP  ? ?[facility] Providers ?Cardiologist:  Lenna Sciara, MD ?Referring MD: Ronnell Freshwater, NP  ? ?Chief Complaint/Reason for Referral:  Cardiology follow up ? ?ASSESSMENT:   ? ?1. NICM (nonischemic cardiomyopathy) ([place])   ?2. Aortic atherosclerosis ([place])   ?3. Hyperlipidemia, unspecified hyperlipidemia type   ?4. Left bundle branch block   ?5. Tobacco abuse   ? ? ?PLAN:   ? ?In order of problems listed above: ?1.  Nonischemic cardiomyopathy: The patient is doing well.  We will increase her Jardiance to 25 mg, her Cozaar to 50 mg, her Toprol to 75 mg, and the spironolactone to 25 mg.  I will check CMP next week.  I will see the patient back in 6 months with an echocardiogram. ?2.  Aortic atherosclerosis:  Continue aspirin, statin, and strict blood pressure control. ?3.  Hyperlipidemia: Not at goal given aortic atherosclerosis.  We will check lipid panel and LFTs next week with her CMP. ?4.  Left bundle branch block: Patient's ejection fraction is 35 to 40%.  We will try to increase her afterload reduction therapy.  She does not qualify for BiV ICD at this point in time. ?5.  Tobacco abuse: I will obtain a carotid ultrasound and abdominal ultrasound for screening purposes. ? ? ?     ? ?  ? ?Dispo:  Return in about 6 months (around 01/25/2022).  ? ?  ? ?Medication Adjustments/Labs and Tests Ordered: ?Current medicines are reviewed at length with the patient today.  Concerns regarding medicines are outlined above. ? ?The following changes have been made:    ? ?Labs/tests ordered: ?Orders Placed This Encounter  ?Procedures  ? Comp Met (CMET)  ? Lipid Profile  ? ECHOCARDIOGRAM COMPLETE  ? VAS [place] AAA DUPLEX  ? VAS US CAROTID  ? ? ?Medication Changes: ?Meds ordered this encounter  ?Medications  ? empagliflozin (JARDIANCE) 25 MG TABS tablet  ?  Sig: Take 1 tablet (25 mg total) by  mouth daily before breakfast.  ?  Dispense:  90 tablet  ?  Refill:  3  ?  Dose increase  ? spironolactone (ALDACTONE) 25 MG tablet  ?  Sig: Take 1 tablet (25 mg total) by mouth at bedtime.  ?  Dispense:  90 tablet  ?  Refill:  3  ?  Dose increase  ? losartan (COZAAR) 50 MG tablet  ?  Sig: Take 1 tablet (50 mg total) by mouth at bedtime.  ?  Dispense:  90 tablet  ?  Refill:  3  ?  Dose increase  ? metoprolol succinate (TOPROL XL) 50 MG 24 hr tablet  ?  Sig: Take 1.5 tablets (75 mg total) by mouth at bedtime.  ?  Dispense:  45 tablet  ?  Refill:  11  ?  Dose increase  ? ? ? ?Current medicines are reviewed at length with the patient today.  The patient does not have concerns regarding medicines. ? ? ?History of Present Illness:   ? ?FOCUSED PROBLEM LIST:   ?1.  Nonischemic cardiomyopathy with EF 35-40% with cardiac catheterization January 2023 demonstrating no obstructive coronary artery disease with cardiac output of 8.3 L/min and index of 3.9 L/min/m? ?2.  Aortic atherosclerosis seen on CT scan September 2022 ?3.  Left bundle branch block ?4.  COPD ?5.  Tobacco abuse ? ?The patient is  a 66 y.o. female with the indicated medical history here for routine follow-up.   ? ?December 2022: Seen for preoperative evaluation.  Due to left bundle branch block an echocardiogram was obtained which showed a new cardiomyopathy.  She is referred for coronary angiography which showed no obstructive coronary artery disease. ? ?January 2023: Seen in follow-up.  She started on Toprol, losartan, spironolactone, and Jardiance. ? ?Today: An echocardiogram done earlier this month demonstrated ejection fraction of 35 to 40%.  There is no significant valvular abnormalities.  The patient underwent uncomplicated surgery with podiatry.  Patient tells me her breathing is fairly good.  Because of her issues with her ankle she has not been walking around as much and is gained a little bit of weight.  She occasionally gets lightheaded when going  from sitting to standing.  She denies any chest pain.  She is tolerating her medications without any issues.  She continues to smoke and is trying to quit.  She is trying to use Nicorette gum.  Her husband unfortunately was hospitalized for quite some time and this really increased her stress level. ? ?   ?  ?Previous Medical History: ?Past Medical History:  ?Diagnosis Date  ? Anxiety   ? Arthritis   ? knees, hips, elbows   ? Asthma   ? hx of asthma 3/12- hospitalized   ? COPD (chronic obstructive pulmonary disease) ([place])   ? Depression   ? GERD (gastroesophageal reflux disease)   ? PRIOR TO HAVING GALLBLADDER REMOVED  ? Headache   ? from her neck  ? Left bundle branch block 06/28/2010  ? Lung infection 2015  ? Lung infection   ? ? ? ?Current Medications: ?Current Meds  ?Medication Sig  ? acetaminophen (TYLENOL) 500 MG tablet Take 1,000 mg by mouth every 6 (six) hours as needed for moderate pain.  ? aspirin EC 81 MG tablet Take 1 tablet (81 mg total) by mouth daily. Swallow whole.  ? atorvastatin (LIPITOR) 40 MG tablet Take 1 tablet (40 mg total) by mouth daily.  ? Budeson-Glycopyrrol-Formoterol (BREZTRI AEROSPHERE) 160-9-4.8 MCG/ACT AERO Inhale 2 puffs into the lungs in the morning and at bedtime.  ? empagliflozin (JARDIANCE) 25 MG TABS tablet Take 1 tablet (25 mg total) by mouth daily before breakfast.  ? losartan (COZAAR) 50 MG tablet Take 1 tablet (50 mg total) by mouth at bedtime.  ? Multiple Vitamin (MULTIVITAMIN WITH MINERALS) TABS tablet Take 2 tablets by mouth daily.  ? ondansetron (ZOFRAN) 4 MG tablet Take 1 tablet (4 mg total) by mouth every 8 (eight) hours as needed for nausea or vomiting.  ? ondansetron (ZOFRAN) 4 MG tablet Take 1 tablet (4 mg total) by mouth every 8 (eight) hours as needed for nausea or vomiting.  ? oxyCODONE-acetaminophen (PERCOCET) 5-325 MG tablet Take 1 tablet by mouth every 4 (four) hours as needed for severe pain.  ? spironolactone (ALDACTONE) 25 MG tablet Take 1 tablet (25 mg  total) by mouth at bedtime.  ? [DISCONTINUED] empagliflozin (JARDIANCE) 10 MG TABS tablet Take 1 tablet (10 mg total) by mouth daily before breakfast.  ? [DISCONTINUED] losartan (COZAAR) 25 MG tablet Take 1 tablet (25 mg total) by mouth at bedtime.  ? [DISCONTINUED] metoprolol succinate (TOPROL XL) 50 MG 24 hr tablet Take 1 tablet (50 mg total) by mouth at bedtime.  ? [DISCONTINUED] spironolactone (ALDACTONE) 25 MG tablet Take 0.5 tablets (12.5 mg total) by mouth every morning.  ?  ? ?Allergies:    ?Codeine  ? ?  Social History:   ?Social History  ? ?Tobacco Use  ? Smoking status: Every Day  ?  Packs/day: 1.00  ?  Years: 25.00  ?  Pack years: 25.00  ?  Types: Cigarettes  ? Smokeless tobacco: Never  ?Vaping Use  ? Vaping Use: Never used  ?Substance Use Topics  ? Alcohol use: Yes  ?  Alcohol/week: 49.0 standard drinks  ?  Types: 49 Cans of beer per week  ? Drug use: No  ?  Comment: hx of 34 years ago marijuana   ?  ? ?Family Hx: ?Family History  ?Problem Relation Age of Onset  ? Heart disease Mother   ? High Cholesterol Mother   ? High blood pressure Mother   ? Depression Brother   ? Diabetes Brother   ?  ? ?Review of Systems:   ?Please see the history of present illness.    ?All other systems reviewed and are negative. ?  ? ? ?EKGs/Labs/Other Test Reviewed:   ? ?EKG: None today ? ?Prior CV studies: ?Echocardiogram April 2023 with ejection fraction of 35 to 40% with mild concentric left ventricular hypertrophy and no significant valvular abnormalities ? ?Coronary angiography and right heart catheterization in January 2023 as detailed above ? ?Other studies Reviewed: ?Review of the additional studies/records demonstrates: CT scan September 2022 demonstrating aortic atherosclerosis ? ?Recent Labs: ?03/04/2021: ALT 16; TSH 1.110 ?04/23/2021: Platelets 258 ?04/26/2021: Hemoglobin 13.3 ?07/16/2021: BUN 12; Creatinine, Ser 0.87; Potassium 4.3; Sodium 141  ? ?Recent Lipid Panel ?Lab Results  ?Component Value Date/Time  ? CHOL 250  (H) 03/04/2021 08:21 AM  ? TRIG 201 (H) 03/04/2021 08:21 AM  ? HDL 51 03/04/2021 08:21 AM  ? LDLCALC 162 (H) 03/04/2021 08:21 AM  ? ? ?Risk Assessment/Calculations:   ? ? ?    ? ?Physical Exam:   ? ?VS:  BP 12

## 2021-07-26 ENCOUNTER — Ambulatory Visit: Payer: Medicare Other | Admitting: Internal Medicine

## 2021-07-26 ENCOUNTER — Encounter: Payer: Self-pay | Admitting: Internal Medicine

## 2021-07-26 VITALS — BP 126/70 | HR 105 | Ht 67.0 in | Wt 218.0 lb

## 2021-07-26 DIAGNOSIS — I700 Atherosclerosis of aorta: Secondary | ICD-10-CM

## 2021-07-26 DIAGNOSIS — Z01810 Encounter for preprocedural cardiovascular examination: Secondary | ICD-10-CM

## 2021-07-26 DIAGNOSIS — I428 Other cardiomyopathies: Secondary | ICD-10-CM

## 2021-07-26 DIAGNOSIS — I447 Left bundle-branch block, unspecified: Secondary | ICD-10-CM | POA: Diagnosis not present

## 2021-07-26 DIAGNOSIS — Z720 Tobacco use: Secondary | ICD-10-CM

## 2021-07-26 DIAGNOSIS — E785 Hyperlipidemia, unspecified: Secondary | ICD-10-CM | POA: Diagnosis not present

## 2021-07-26 MED ORDER — LOSARTAN POTASSIUM 50 MG PO TABS: 50.0000 mg | ORAL_TABLET | Freq: Every day | ORAL | 3 refills | Status: DC

## 2021-07-26 MED ORDER — EMPAGLIFLOZIN 25 MG PO TABS: 25.0000 mg | ORAL_TABLET | Freq: Every day | ORAL | 3 refills | Status: DC

## 2021-07-26 MED ORDER — SPIRONOLACTONE 25 MG PO TABS: 25.0000 mg | ORAL_TABLET | Freq: Every day | ORAL | 3 refills | Status: DC

## 2021-07-26 MED ORDER — METOPROLOL SUCCINATE ER 50 MG PO TB24: 75.0000 mg | ORAL_TABLET | Freq: Every day | ORAL | 11 refills | Status: DC

## 2021-07-26 NOTE — Patient Instructions (Addendum)
Medication Instructions:  ?Your physician has recommended you make the following change in your medication:  ?1.) increase Jardiance to 25 mg daily each morning ?2.) increase Toprol to 75 mg daily at bedtime ?3.) increase losartan to 50 mg daily at bedtime ?4.) increase spironolactone to 25 mg daily at bedtime ? ? ?*If you need a refill on your cardiac medications before your next appointment, please call your pharmacy* ? ? ?Lab Work: ?Please return next week for lab work (CMET, Lipids) ? ? ?Testing/Procedures: ?Your physician has requested that you have an abdominal aorta duplex. During this test, an ultrasound is used to evaluate the aorta. Allow 30 minutes for this exam. Do not eat after midnight the day before and avoid carbonated beverages ? ?Your physician has requested that you have a carotid duplex. This test is an ultrasound of the carotid arteries in your neck. It looks at blood flow through these arteries that supply the brain with blood. Allow one hour for this exam. There are no restrictions or special instructions. ? ?ECHO DUE IN 6 MONTHS BEFORE NEXT VISIT: ?Your physician has requested that you have an echocardiogram. Echocardiography is a painless test that uses sound waves to create images of your heart. It provides your doctor with information about the size and shape of your heart and how well your heart?s chambers and valves are working. This procedure takes approximately one hour. There are no restrictions for this procedure. ? ? ? ?Follow-Up: ?At [facility], you and your health needs are our priority.  As part of our continuing mission to provide you with exceptional heart care, we have created designated Provider Care Teams.  These Care Teams include your primary Cardiologist (physician) and Advanced Practice Providers (APPs -  Physician Assistants and Nurse Practitioners) who all work together to provide you with the care you need, when you need it. ?  ? ?Your next appointment:   ?6  month(s) ? ?The format for your next appointment:   ?In Person ? ?Provider:   ?Early Osmond, MD   ? ? ?Important Information About Sugar ? ? ? ? ?  ?

## 2021-07-30 ENCOUNTER — Other Ambulatory Visit: Payer: Medicare Other | Admitting: *Deleted

## 2021-07-30 DIAGNOSIS — I428 Other cardiomyopathies: Secondary | ICD-10-CM

## 2021-07-30 DIAGNOSIS — I447 Left bundle-branch block, unspecified: Secondary | ICD-10-CM

## 2021-07-30 DIAGNOSIS — E785 Hyperlipidemia, unspecified: Secondary | ICD-10-CM

## 2021-07-30 DIAGNOSIS — I700 Atherosclerosis of aorta: Secondary | ICD-10-CM

## 2021-07-30 DIAGNOSIS — Z720 Tobacco use: Secondary | ICD-10-CM

## 2021-07-30 LAB — LIPID PANEL
Chol/HDL Ratio: 3.4 ratio (ref 0.0–4.4)
Cholesterol, Total: 181 mg/dL (ref 100–199)
HDL: 53 mg/dL (ref >39)
LDL Chol Calc (NIH): 95 mg/dL (ref 0–99)
Triglycerides: 196 mg/dL — ABNORMAL HIGH (ref 0–149)
VLDL Cholesterol Cal: 33 mg/dL (ref 5–40)

## 2021-07-30 LAB — COMPREHENSIVE METABOLIC PANEL
ALT: 23 IU/L (ref 0–32)
AST: 22 IU/L (ref 0–40)
Albumin/Globulin Ratio: 1.6 (ref 1.2–2.2)
Albumin: 4.4 g/dL (ref 3.8–4.8)
Alkaline Phosphatase: 127 IU/L — ABNORMAL HIGH (ref 44–121)
BUN/Creatinine Ratio: 14 (ref 12–28)
BUN: 14 mg/dL (ref 8–27)
Bilirubin Total: 0.7 mg/dL (ref 0.0–1.2)
CO2: 22 mmol/L (ref 20–29)
Calcium: 10.3 mg/dL (ref 8.7–10.3)
Chloride: 101 mmol/L (ref 96–106)
Creatinine, Ser: 0.97 mg/dL (ref 0.57–1.00)
Globulin, Total: 2.8 g/dL (ref 1.5–4.5)
Glucose: 113 mg/dL — ABNORMAL HIGH (ref 70–99)
Potassium: 4.3 mmol/L (ref 3.5–5.2)
Sodium: 139 mmol/L (ref 134–144)
Total Protein: 7.2 g/dL (ref 6.0–8.5)
eGFR: 65 mL/min/{1.73_m2} (ref >59)

## 2021-08-20 ENCOUNTER — Other Ambulatory Visit: Payer: Self-pay | Admitting: Internal Medicine

## 2021-08-20 ENCOUNTER — Ambulatory Visit (HOSPITAL_BASED_OUTPATIENT_CLINIC_OR_DEPARTMENT_OTHER)
Admission: RE | Admit: 2021-08-20 | Discharge: 2021-08-20 | Disposition: A | Discharge status: Home / Self Care | Payer: Medicare Other | Source: Ambulatory Visit | Attending: Internal Medicine | Admitting: Internal Medicine

## 2021-08-20 ENCOUNTER — Ambulatory Visit (HOSPITAL_COMMUNITY)
Admission: RE | Admit: 2021-08-20 | Discharge: 2021-08-20 | Disposition: A | Discharge status: Home / Self Care | Payer: Medicare Other | Source: Ambulatory Visit | Attending: Cardiology | Admitting: Cardiology

## 2021-08-20 DIAGNOSIS — I700 Atherosclerosis of aorta: Secondary | ICD-10-CM | POA: Insufficient documentation

## 2021-08-20 DIAGNOSIS — Z720 Tobacco use: Secondary | ICD-10-CM | POA: Insufficient documentation

## 2021-08-20 DIAGNOSIS — I428 Other cardiomyopathies: Secondary | ICD-10-CM | POA: Insufficient documentation

## 2021-08-20 DIAGNOSIS — E785 Hyperlipidemia, unspecified: Secondary | ICD-10-CM

## 2021-08-20 DIAGNOSIS — I447 Left bundle-branch block, unspecified: Secondary | ICD-10-CM | POA: Diagnosis not present

## 2021-08-20 DIAGNOSIS — I6523 Occlusion and stenosis of bilateral carotid arteries: Secondary | ICD-10-CM | POA: Diagnosis not present

## 2021-08-20 DIAGNOSIS — I6529 Occlusion and stenosis of unspecified carotid artery: Secondary | ICD-10-CM | POA: Insufficient documentation

## 2021-08-22 ENCOUNTER — Telehealth: Payer: Self-pay | Admitting: *Deleted

## 2021-08-22 DIAGNOSIS — E785 Hyperlipidemia, unspecified: Secondary | ICD-10-CM

## 2021-08-22 MED ORDER — ATORVASTATIN CALCIUM 80 MG PO TABS: 80.0000 mg | ORAL_TABLET | Freq: Every day | ORAL | 3 refills | Status: DC

## 2021-08-22 NOTE — Telephone Encounter (Signed)
Spoke w patient.  Reviewed all of her med changes from last ov and lab results.  She will increase atorvastatin to 80 mg and we will check fasting lipids and LFTs when she returns for echo on 10/4 per Dr. Ali Lowe.

## 2021-08-22 NOTE — Telephone Encounter (Signed)
-----   Message from Early Osmond, MD sent at 07/31/2021  6:32 AM EDT ----- Labs ok, cont all meds but increase atorva to 80 and we need FLP and LFTs the day of her next visit.

## 2021-08-26 ENCOUNTER — Telehealth: Payer: Self-pay | Admitting: Pulmonary Disease

## 2021-08-26 NOTE — Telephone Encounter (Signed)
Called patient and she states her Breztri inhaler is costing her too much money! She states she is in the donut hole.   Is there anything cheaper for her under her insurance ladies!  Thank you

## 2021-08-27 ENCOUNTER — Other Ambulatory Visit (HOSPITAL_COMMUNITY): Payer: Self-pay

## 2021-08-27 MED ORDER — BREZTRI AEROSPHERE 160-9-4.8 MCG/ACT IN AERO: 2.0000 | INHALATION_SPRAY | Freq: Two times a day (BID) | RESPIRATORY_TRACT | 0 refills | Status: DC

## 2021-08-27 NOTE — Telephone Encounter (Signed)
Unfortunately, with a remaining balance of ~700, that amount would have to be met before traditional copay will go back in to play. Any purchase made in the pharmacy will go towards that donut hole. Trelegy is the alternative in that class with Breztri, and that copay amount is $161.47

## 2021-08-27 NOTE — Telephone Encounter (Signed)
Called and spoke with patient. She verbalized understanding. She wishes to proceed with the patient assistance for [facility]. I went over the process and have also left 2 samples at the front desk for her. She will stop by the office on Thursday to complete her portion.   Nothing further needed at time of call.

## 2021-08-27 NOTE — Telephone Encounter (Signed)
Patient states that he is in the donut hole and that his breztri is costing her too much right now.   I messaged the Prior Auth ladies and they state:   Unfortunately, with a remaining balance of ~700, that amount would have to be met before traditional copay will go back in to play. Any purchase made in the pharmacy will go towards that donut hole. Trelegy is the alternative in that class with Breztri, and that copay amount is $161.47  Do you want to try the patient on Trelegy? Or do you want me to offer patient assistance for Breztri sir?  Please advise

## 2021-08-30 ENCOUNTER — Ambulatory Visit (INDEPENDENT_AMBULATORY_CARE_PROVIDER_SITE_OTHER): Payer: Medicare Other

## 2021-08-30 ENCOUNTER — Ambulatory Visit (INDEPENDENT_AMBULATORY_CARE_PROVIDER_SITE_OTHER): Payer: Medicare Other | Admitting: Podiatry

## 2021-08-30 DIAGNOSIS — M2012 Hallux valgus (acquired), left foot: Secondary | ICD-10-CM

## 2021-09-04 NOTE — Progress Notes (Signed)
Subjective:  Patient ID: Katrina [name], female    DOB: 12/11/1955,  MRN: [id number]  Chief Complaint  Patient presents with   Routine Post Op    POV #4 DOS 05/27/21 -  LEFT FOOT BUNIONECTOMY WITH POSSIBLE PHALANGEAL OSTEOTOMY WITH FIXATION    DOS: 05/27/2021 Procedure: Left bunionectomy with phalangeal osteotomy  66 y.o. female returns for post-op check.  She states she is doing okay.  She is having some pain at the bunion site.  She states that there is some numbness is present she is still having some pain.  She wanted me to get it evaluated.  She denies any other acute complaints  Review of Systems: Negative except as noted in the HPI. Denies N/V/F/Ch.  Past Medical History:  Diagnosis Date   Anxiety    Arthritis    knees, hips, elbows    Asthma    hx of asthma 3/12- hospitalized    COPD (chronic obstructive pulmonary disease) (HCC)    Depression    GERD (gastroesophageal reflux disease)    PRIOR TO HAVING GALLBLADDER REMOVED   Headache    from her neck   Left bundle branch block 06/28/2010   Lung infection 2015   Lung infection     Current Outpatient Medications:    acetaminophen (TYLENOL) 500 MG tablet, Take 1,000 mg by mouth every 6 (six) hours as needed for moderate pain., Disp: , Rfl:    aspirin EC 81 MG tablet, Take 1 tablet (81 mg total) by mouth daily. Swallow whole., Disp: 90 tablet, Rfl: 3   atorvastatin (LIPITOR) 80 MG tablet, Take 1 tablet (80 mg total) by mouth daily., Disp: 90 tablet, Rfl: 3   Budeson-Glycopyrrol-Formoterol (BREZTRI AEROSPHERE) 160-9-4.8 MCG/ACT AERO, Inhale 2 puffs into the lungs in the morning and at bedtime., Disp: 10.7 g, Rfl: 11   Budeson-Glycopyrrol-Formoterol (BREZTRI AEROSPHERE) 160-9-4.8 MCG/ACT AERO, Inhale 2 puffs into the lungs in the morning and at bedtime., Disp: 11.8 g, Rfl: 0   empagliflozin (JARDIANCE) 25 MG TABS tablet, Take 1 tablet (25 mg total) by mouth daily before breakfast., Disp: 90 tablet, Rfl: 3   losartan  (COZAAR) 50 MG tablet, Take 1 tablet (50 mg total) by mouth at bedtime., Disp: 90 tablet, Rfl: 3   metoprolol succinate (TOPROL XL) 50 MG 24 hr tablet, Take 1.5 tablets (75 mg total) by mouth at bedtime., Disp: 45 tablet, Rfl: 11   Multiple Vitamin (MULTIVITAMIN WITH MINERALS) TABS tablet, Take 2 tablets by mouth daily., Disp: , Rfl:    ondansetron (ZOFRAN) 4 MG tablet, Take 1 tablet (4 mg total) by mouth every 8 (eight) hours as needed for nausea or vomiting., Disp: 20 tablet, Rfl: 0   ondansetron (ZOFRAN) 4 MG tablet, Take 1 tablet (4 mg total) by mouth every 8 (eight) hours as needed for nausea or vomiting., Disp: 20 tablet, Rfl: 0   oxyCODONE-acetaminophen (PERCOCET) 5-325 MG tablet, Take 1 tablet by mouth every 4 (four) hours as needed for severe pain., Disp: 30 tablet, Rfl: 0   spironolactone (ALDACTONE) 25 MG tablet, Take 1 tablet (25 mg total) by mouth at bedtime., Disp: 90 tablet, Rfl: 3  Social History   Tobacco Use  Smoking Status Every Day   Packs/day: 1.00   Years: 25.00   Pack years: 25.00   Types: Cigarettes  Smokeless Tobacco Never    Allergies  Allergen Reactions   Codeine Itching and Nausea Only   Objective:  There were no vitals filed for this visit. There is no  height or weight on file to calculate BMI. Constitutional Well developed. Well nourished.  Vascular Foot warm and well perfused. Capillary refill normal to all digits.   Neurologic Normal speech. Oriented to person, place, and time. Epicritic sensation to light touch grossly present bilaterally.  Dermatologic Skin completely reepithelialized.  No clinical signs of infection noted no dehiscence noted.  Some recurrence of bunion clinically appreciated.  Orthopedic: Now tenderness to palpation noted about the surgical site.   Radiographs: 3 views of skeletally mature adult left foot: Proximal part of the fragment is lifting up.  There does not appear to be much shifting of the capital fragment.  1 screw  appears to be holding it in place.  We will continue to monitor for consolidation Assessment:   1. Hallux abducto valgus, left     Plan:  Patient was evaluated and treated and all questions answered.  S/p foot surgery left -Progressing as expected post-operatively. -XR: Repeat foot x-ray does show failure of the proximal screw however there has not been any recurrence of bunion deformity noted.  The bunion and the sesamoid position on.  The good alignment.  Continue clinical monitor -WB Status: Transition to regular shoes. -Sutures: Removed no clinical signs of dehiscence noted.  Mild erythema noted around the incision site. -Medications: None -At this time I discussed with the patient that we can continue clinical monitor.  She does not have much pain in the area she has still some numbness.  I discussed shoe gear modification as well as orthotics importance in extensive detail she states understanding and she is working on Psychologist, counselling modification -I discussed with her that the proximal fragment is lifting up a little bit likely either due to screw failure or too much weightbearing.  I discussed with her that if we lose too much correction we may have to revisit the surgical site to be fixated.  I discussed this with her in extensive detail she states understanding.  She would like to hold off on any revisional surgery.  We will can wait and watch  No follow-ups on file.

## 2021-10-17 ENCOUNTER — Encounter: Payer: Self-pay | Admitting: Physician Assistant

## 2021-10-17 ENCOUNTER — Ambulatory Visit: Payer: Medicare Other | Admitting: Physician Assistant

## 2021-10-17 ENCOUNTER — Ambulatory Visit (INDEPENDENT_AMBULATORY_CARE_PROVIDER_SITE_OTHER): Payer: Medicare Other

## 2021-10-17 DIAGNOSIS — M25531 Pain in right wrist: Secondary | ICD-10-CM | POA: Diagnosis not present

## 2021-10-17 DIAGNOSIS — M25561 Pain in right knee: Secondary | ICD-10-CM | POA: Diagnosis not present

## 2021-10-17 NOTE — Progress Notes (Signed)
Office Visit Note   Patient: Katrina [name]           Date of Birth: 09-04-1955           MRN: [id number] Visit Date: 10/17/2021              Requested by: Ronnell Freshwater, NP [place],  [place] [address] PCP: Ronnell Freshwater, NP   Assessment & Plan: Visit Diagnoses:  1. Acute pain of right knee   2. Pain in right wrist     Plan: She will apply Voltaren gel over the anterior aspect of the knee.  Offered formal physical therapy she defers.  Therefore she is given quad strengthening exercises and she is to work on range of motion of the knee.  I like her to begin walking with a rolling walker and gradually graduate to a cane as tolerated.  See her back in 4 weeks sooner if her condition becomes worse in any way.  In regards to the wrist she is overall having no pain at rest at this point in time.  Recommend no treatment.   Follow-Up Instructions: Return in about 4 weeks (around 11/14/2021).   Orders:  Orders Placed This Encounter  Procedures   XR Knee 1-2 Views Right   XR Wrist Complete Right   No orders of the defined types were placed in this encounter.     Procedures: No procedures performed   Clinical Data: No additional findings.   Subjective: Chief Complaint  Patient presents with   Right Knee - Pain   Right Wrist - Pain    HPI Katrina [name] is a 66 year old female well-known to Dr. Trevor Mace service.  Comes in today status post fall 3 to 4 weeks ago.  She states that her dog jerked her down and caused her to fall forward.  No loss of consciousness.  She has had right knee pain and right wrist pain.  Mainly her right knee is bothering her at this point in time.  She states the wrist is overall improving.  Knee is however seems to be getting worse.  She is bearing some weight on the knee but minimal ambulation secondary to pain.  She has been taking Tylenol and ibuprofen for the pain.  She has a history of bilateral knee replacements that  have done well until now.  She also is complaining of some right shoulder pain.  Review of Systems See HPI otherwise negative  Objective: Vital Signs: There were no vitals taken for this visit.  Physical Exam General: Well-developed well-nourished female no acute distress mood and affect appropriate. Psych: Alert and oriented x3 Ortho Exam Right shoulder full range of motion without pain.  She has bilateral 5 strengths external and internal rotation against resistance.  Empty can test is negative bilaterally. Right wrist she has full range of motion of the wrist without pain.  There is no bruising, rashes or skin lesions.  She has no tenderness about the wrist.  No tenderness throughout the hand.  Hand is neurovascular intact.  Right knee well-healed surgical incision she has full extension actively and flexion to approximately 110 degrees.  No gross instability valgus varus stressing.  She has tenderness over the patellar tibial tendon.  No gross deficit.  Calf supple nontender.  There is no erythema fluctuance over the anterior aspect of the knee consistent with a bursitis.  No effusion in the knee. Specialty Comments:  No specialty comments available.  Imaging: XR Knee 1-2 Views Right  Result Date: 10/17/2021 Right knee 2 views: No acute fractures.  Knee is well located.  Status post right total knee arthroplasty well-seated components.  XR Wrist Complete Right  Result Date: 10/17/2021 Right wrist 3 views: Wrist is well located.  No acute fractures involving the wrist or carpal bones.  Proximal metacarpals partially visualized without any acute findings.  Cystic changes within the scaphoid noted.    PMFS History: Patient Active Problem List   Diagnosis Date Noted   Chronic systolic heart failure ([place]) 05/21/2021   Leukocytosis 03/07/2021   Mixed hyperlipidemia 03/07/2021   Left bundle branch block 02/20/2021   Left axis deviation 02/20/2021   Acquired hallux varus of left foot  02/20/2021   Porokeratosis 12/31/2020   Sesamoiditis 12/31/2020   Hav (hallux abducto valgus), left 12/31/2020   Encounter to establish care 12/16/2020   Cigarette smoker motivated to quit 12/16/2020   Callus of foot 12/16/2020   Body mass index (BMI) of 34.0-34.9 in adult 12/16/2020   Other spondylosis with radiculopathy, cervical region 04/02/2020    Class: Chronic   Pain in finger of left hand 02/24/2020   Lung nodules 11/30/2019   Neck pain 08/04/2019   Acute pain of right shoulder 04/27/2019   Neck mass 04/27/2019   Postmenopausal bleeding 06/16/2018   S/P hysterectomy 06/16/2018   Chronic right shoulder pain 10/28/2016   Impingement syndrome of right shoulder 10/28/2016   Osteoarthritis of left knee 12/15/2014   Status post total left knee replacement 12/15/2014   Hand pain, left 04/21/2014   Cat bite of finger 04/21/2014   Asthma 04/21/2014   Tobacco abuse 04/21/2014   COPD (chronic obstructive pulmonary disease) ([place])    Effusion of left knee joint 11/03/2013   Arthritis of right knee 12/03/2012   Degenerative arthritis of hip 05/02/2011   Past Medical History:  Diagnosis Date   Anxiety    Arthritis    knees, hips, elbows    Asthma    hx of asthma 3/12- hospitalized    COPD (chronic obstructive pulmonary disease) (HCC)    Depression    GERD (gastroesophageal reflux disease)    PRIOR TO HAVING GALLBLADDER REMOVED   Headache    from her neck   Left bundle branch block 06/28/2010   Lung infection 2015   Lung infection     Family History  Problem Relation Age of Onset   Heart disease Mother    High Cholesterol Mother    High blood pressure Mother    Depression Brother    Diabetes Brother     Past Surgical History:  Procedure Laterality Date   BACK SURGERY     x2   CARPAL TUNNEL RELEASE     left    CESAREAN SECTION     x 2   CHOLECYSTECTOMY     COLONOSCOPY  10/2017   DILATATION & CURETTAGE/HYSTEROSCOPY WITH MYOSURE N/A 12/10/2017   Procedure:  DILATATION & CURETTAGE/HYSTEROSCOPY WITH MYOSURE;  Surgeon: Christophe Louis, MD;  Location: [facility];  Service: Gynecology;  Laterality: N/A;  POSSIBLE MYOMECTOMY VS POLYPECTOMY WITH MYOSURE   DILATION AND CURETTAGE OF UTERUS     ELBOW SURGERY     JOINT REPLACEMENT     KNEE ARTHROSCOPY     bilateral x 2    KNEE ARTHROSCOPY Left 11/03/2013   Procedure: LEFT KNEE ARTHROSCOPY WITH DEBRIDEMENT, PARTIAL SYNOVECTOMY;  Surgeon: Mcarthur Rossetti, MD;  Location: [facility];  Service: Orthopedics;  Laterality: Left;  LAPAROSCOPY     left elbow surgery      OTHER SURGICAL HISTORY     arthroswcopic surgery right knee    OTHER SURGICAL HISTORY     arthroscopic surgery left knee x 2    OTHER SURGICAL HISTORY     bunionectomy right foot    OTHER SURGICAL HISTORY     C Section x 2    OTHER SURGICAL HISTORY     carpal tunnel on left    POSTERIOR CERVICAL FUSION/FORAMINOTOMY N/A 04/02/2020   Procedure: RIGHT C5-6 FORAMINOTOMY;  Surgeon: Jessy Oto, MD;  Location: [facility];  Service: Orthopedics;  Laterality: N/A;   RADIOLOGY WITH ANESTHESIA N/A 07/28/2019   Procedure: MRI WITH ANESTHESIA OF NECK SOFT TISSUE ONLY WITH AND WITHOUT CONTRAST;  Surgeon: Radiologist, Medication, MD;  Location: [facility];  Service: Radiology;  Laterality: N/A;   RADIOLOGY WITH ANESTHESIA N/A 10/04/2019   Procedure: MRI WITH ANESTHESIA RIGHT SHOULDER WITHOUT CONTRAST,MRI OF CERVICAL SPINE WITHOUT CONTRAST;  Surgeon: Radiologist, Medication, MD;  Location: [facility];  Service: Radiology;  Laterality: N/A;   RADIOLOGY WITH ANESTHESIA N/A 08/30/2020   Procedure: MRI WITH ANESTHESIA CERVICAL SPINE WITHOUT CONTRAST;  Surgeon: Radiologist, Medication, MD;  Location: [facility];  Service: Radiology;  Laterality: N/A;   right foot bunionectomy      RIGHT/LEFT HEART CATH AND CORONARY ANGIOGRAPHY N/A 04/26/2021   Procedure: RIGHT/LEFT HEART CATH AND CORONARY ANGIOGRAPHY;  Surgeon: Early Osmond, MD;  Location: [facility];  Service: Cardiovascular;   Laterality: N/A;   ROBOTIC ASSISTED TOTAL HYSTERECTOMY WITH BILATERAL SALPINGO OOPHERECTOMY Bilateral 06/16/2018   Procedure: XI ROBOTIC ASSISTED TOTAL HYSTERECTOMY WITH RIGHT SALPINGO OOPHORECTOMY;  Surgeon: Christophe Louis, MD;  Location: [facility];  Service: Gynecology;  Laterality: Bilateral;   TOTAL HIP ARTHROPLASTY  05/02/2011   Procedure: TOTAL HIP ARTHROPLASTY ANTERIOR APPROACH;  Surgeon: Mcarthur Rossetti, MD;  Location: [facility];  Service: Orthopedics;  Laterality: Left;  Left Total Hip Arthroplasty, Direct Anterior Approach   TOTAL KNEE ARTHROPLASTY Right 12/03/2012   Procedure: RIGHT TOTAL KNEE ARTHROPLASTY;  Surgeon: Mcarthur Rossetti, MD;  Location: [facility];  Service: Orthopedics;  Laterality: Right;   TOTAL KNEE ARTHROPLASTY Left 12/15/2014   Procedure: LEFT TOTAL KNEE ARTHROPLASTY;  Surgeon: Mcarthur Rossetti, MD;  Location: [facility];  Service: Orthopedics;  Laterality: Left;   TUBAL LIGATION     ULNAR NERVE TRANSPOSITION     left    UPPER GI ENDOSCOPY     x 2   Social History   Occupational History   Occupation: disabled  Tobacco Use   Smoking status: Every Day    Packs/day: 1.00    Years: 25.00    Total pack years: 25.00    Types: Cigarettes   Smokeless tobacco: Never  Vaping Use   Vaping Use: Never used  Substance and Sexual Activity   Alcohol use: Yes    Alcohol/week: 49.0 standard drinks of alcohol    Types: 49 Cans of beer per week   Drug use: No    Comment: hx of 34 years ago marijuana    Sexual activity: Not Currently

## 2021-11-15 ENCOUNTER — Other Ambulatory Visit: Payer: Self-pay | Admitting: *Deleted

## 2021-11-15 NOTE — Patient Outreach (Signed)
  Care Coordination   11/15/2021 Name: Katrina [name] MRN: [id number] DOB: 1955/10/20   Care Coordination Outreach Attempts:  An unsuccessful telephone outreach was attempted today to offer the patient information about available care coordination services as a benefit of their health plan.   Follow Up Plan:  Additional outreach attempts will be made to offer the patient care coordination information and services.   Encounter Outcome:  No Answer  Care Coordination Interventions Activated:  No   Care Coordination Interventions:  No, not indicated    Jream Broyles C. Myrtie Neither, MSN, [facility] Gerontological Nurse Practitioner [facility] Care Management [phone]

## 2021-11-18 ENCOUNTER — Ambulatory Visit: Payer: Medicare Other | Admitting: Physician Assistant

## 2021-11-20 ENCOUNTER — Other Ambulatory Visit: Payer: Self-pay | Admitting: *Deleted

## 2021-11-20 NOTE — Patient Outreach (Signed)
  Care Coordination   11/20/2021 Name: Katrina [name] MRN: [id number] DOB: 09/12/1955   Care Coordination Outreach Attempts:  A second unsuccessful outreach was attempted today to offer the patient with information about available care coordination services as a benefit of their health plan.     Follow Up Plan:  Additional outreach attempts will be made to offer the patient care coordination information and services.   Encounter Outcome:  No Answer  Care Coordination Interventions Activated:  No   Care Coordination Interventions:  No, not indicated    Bani Gianfrancesco C. Myrtie Neither, MSN, [facility] Gerontological Nurse Practitioner [facility] Care Management [phone]

## 2021-11-27 ENCOUNTER — Ambulatory Visit: Payer: Medicare Other | Admitting: Physician Assistant

## 2021-12-03 ENCOUNTER — Telehealth: Payer: Self-pay | Admitting: *Deleted

## 2021-12-03 NOTE — Patient Outreach (Signed)
  Care Coordination   12/03/2021 Name: Katrina [name] MRN: [id number] DOB: Aug 11, 1955   Care Coordination Outreach Attempts:  A third unsuccessful outreach was attempted today to offer the patient with information about available care coordination services as a benefit of their health plan.   Follow Up Plan:  No further outreach attempts will be made at this time. We have been unable to contact the patient to offer or enroll patient in care coordination services  Encounter Outcome:  No Answer  Care Coordination Interventions Activated:  No   Care Coordination Interventions:  No, not indicated    Jacqlyn Larsen [facility], BSN RN Care Coordinator [phone]

## 2021-12-20 ENCOUNTER — Telehealth: Payer: Self-pay | Admitting: Nurse Practitioner

## 2021-12-20 NOTE — Telephone Encounter (Signed)
Tai, NP with [employer] called to report she seen patient yesterday and her blood pressure was elevated at 178/98 and that was taken twice. Also patient is having intermittent chest pains which is relieved with resting. If you have any questions or concerns please contact her back at [phone]

## 2021-12-20 NOTE — Telephone Encounter (Signed)
Called Tai the NP LVM for someone to go back and recheck the patient BP again or send the patient to the ER if her BP continued to be high

## 2022-01-08 ENCOUNTER — Ambulatory Visit: Payer: Medicare Other | Attending: Cardiology

## 2022-01-08 ENCOUNTER — Ambulatory Visit: Payer: Medicare Other

## 2022-01-08 DIAGNOSIS — Z720 Tobacco use: Secondary | ICD-10-CM | POA: Diagnosis not present

## 2022-01-08 DIAGNOSIS — E785 Hyperlipidemia, unspecified: Secondary | ICD-10-CM

## 2022-01-08 DIAGNOSIS — I428 Other cardiomyopathies: Secondary | ICD-10-CM | POA: Insufficient documentation

## 2022-01-08 DIAGNOSIS — I447 Left bundle-branch block, unspecified: Secondary | ICD-10-CM | POA: Insufficient documentation

## 2022-01-08 DIAGNOSIS — I700 Atherosclerosis of aorta: Secondary | ICD-10-CM | POA: Insufficient documentation

## 2022-01-08 LAB — LIPID PANEL
Chol/HDL Ratio: 3.5 ratio (ref 0.0–4.4)
Cholesterol, Total: 177 mg/dL (ref 100–199)
HDL: 50 mg/dL (ref >39)
LDL Chol Calc (NIH): 89 mg/dL (ref 0–99)
Triglycerides: 229 mg/dL — ABNORMAL HIGH (ref 0–149)
VLDL Cholesterol Cal: 38 mg/dL (ref 5–40)

## 2022-01-08 LAB — HEPATIC FUNCTION PANEL
ALT: 18 IU/L (ref 0–32)
AST: 17 IU/L (ref 0–40)
Albumin: 4.4 g/dL (ref 3.9–4.9)
Alkaline Phosphatase: 125 IU/L — ABNORMAL HIGH (ref 44–121)
Bilirubin Total: 0.5 mg/dL (ref 0.0–1.2)
Bilirubin, Direct: 0.16 mg/dL (ref 0.00–0.40)
Total Protein: 7.1 g/dL (ref 6.0–8.5)

## 2022-01-08 NOTE — Progress Notes (Signed)
Cardiology Office Note:    Date:  01/15/2022   ID:  Katrina [name], DOB 1956-03-14, MRN [id number]  PCP:  Ronnell Freshwater, NP   [facility] Providers Cardiologist:  Lenna Sciara, MD Referring MD: Ronnell Freshwater, NP   Chief Complaint/Reason for Referral:  Cardiology follow up  ASSESSMENT:    1. NICM (nonischemic cardiomyopathy) ([place])   2. Aortic atherosclerosis ([place])   3. Hyperlipidemia LDL goal <70   4. Left bundle branch block   5. Tobacco abuse      PLAN:    In order of problems listed above: 1.  Nonischemic cardiomyopathy: Most recent echocardiogram demonstrated ejection fraction of 35 to 40%.  We will stop losartan and start mid dose Entresto.  Will increase Toprol to 100 mg.  We will obtain a monitor to evaluate given her presyncope.  May consider MRI for more accurate EF assessment.  Follow-up in 3 months or earlier if needed. 2.  Aortic atherosclerosis:  Continue aspirin, statin, and strict blood pressure control. 3.  Hyperlipidemia: Her atorvastatin was increased to 80 recently due to an LDL above 70; Check lipid panel, LFTs day of next visit 4.  Left bundle branch block: Monitor for now. 5.  Tobacco abuse: Carotid and abdominal ultrasounds were reassuring            Dispo:  Return in about 3 months (around 04/17/2022).      Medication Adjustments/Labs and Tests Ordered: Current medicines are reviewed at length with the patient today.  Concerns regarding medicines are outlined above.  The following changes have been made:     Labs/tests ordered: Orders Placed This Encounter  Procedures   Lipid panel   Lipoprotein A (LPA)   LONG TERM MONITOR (3-14 DAYS)   EKG 12-Lead    Medication Changes: Meds ordered this encounter  Medications   metoprolol succinate (TOPROL-XL) 100 MG 24 hr tablet    Sig: Take 1 tablet (100 mg total) by mouth daily. Take with or immediately following a meal.    Dispense:  90 tablet    Refill:  3   DISCONTD:  sacubitril-valsartan (ENTRESTO) 49-51 mg per tablet    Order Specific Question:   ACE-inhibitors have NOT been administered in the past 36-hours.    Answer:   YES (confirmed by ordering provider)   sacubitril-valsartan (ENTRESTO) 49-51 MG    Sig: Take 1 tablet by mouth 2 (two) times daily.    Dispense:  180 tablet    Refill:  3     Current medicines are reviewed at length with the patient today.  The patient does not have concerns regarding medicines.   History of Present Illness:    FOCUSED PROBLEM LIST:   1.  Nonischemic cardiomyopathy with EF 35-40% with cardiac catheterization January 2023 demonstrating no obstructive coronary artery disease with cardiac output of 8.3 L/min and index of 3.9 L/min/m 2.  Aortic atherosclerosis seen on CT scan September 2022 3.  Left bundle branch block 4.  COPD 5.  Tobacco abuse  The patient is a 66 y.o. female with the indicated medical history here for routine follow-up.    December 2022: Seen for preoperative evaluation.  Due to left bundle branch block an echocardiogram was obtained which showed a new cardiomyopathy.  She is referred for coronary angiography which showed no obstructive coronary artery disease.  January 2023: Seen in follow-up.  She started on Toprol, losartan, spironolactone, and Jardiance.  April 2023: An echocardiogram done earlier this month demonstrated ejection  fraction of 35 to 40%.  There is no significant valvular abnormalities.  The patient underwent uncomplicated surgery with podiatry.  Patient tells me her breathing is fairly good.  Because of her issues with her ankle she has not been walking around as much and is gained a little bit of weight.  She occasionally gets lightheaded when going from sitting to standing.  She denies any chest pain.  She is tolerating her medications without any issues.  She continues to smoke and is trying to quit.  She is trying to use Nicorette gum.  Her husband unfortunately was  hospitalized for quite some time and this really increased her stress level.  Plan: Increase Jardiance to 25 mg, Cozaar to 50 mg check lipid panel LFTs carotid ultrasound abdominal ultrasound for screening purposes given longstanding tobacco abuse.  Today: Carotid and abdominal ultrasound demonstrated no significant abnormalities.  Her atorvastatin was increased to 80 given above goal LDL.  The patient tells me that she has had occasional presyncope and palpitations at times.  This happens maybe once or twice a week.  Last time it happened was about a week ago.  She denies any exertional chest pain, peripheral edema, but does have problems lying flat.  She feels uncomfortable in terms of breathing.  She denies any signs or symptoms of stroke, or severe bleeding though she has some nuisance bruising while on aspirin.  She has required no emergency room visits or hospitalizations.  She is unable to afford her Vania Rea and therefore has not been taking it.  She unfortunately continues to smoke.         Current Medications: Current Meds  Medication Sig   acetaminophen (TYLENOL) 500 MG tablet Take 1,000 mg by mouth every 6 (six) hours as needed for moderate pain.   aspirin EC 81 MG tablet Take 1 tablet (81 mg total) by mouth daily. Swallow whole.   atorvastatin (LIPITOR) 80 MG tablet Take 1 tablet (80 mg total) by mouth daily.   Budeson-Glycopyrrol-Formoterol (BREZTRI AEROSPHERE) 160-9-4.8 MCG/ACT AERO Inhale 2 puffs into the lungs in the morning and at bedtime.   empagliflozin (JARDIANCE) 25 MG TABS tablet Take 1 tablet (25 mg total) by mouth daily before breakfast.   metoprolol succinate (TOPROL-XL) 100 MG 24 hr tablet Take 1 tablet (100 mg total) by mouth daily. Take with or immediately following a meal.   Multiple Vitamin (MULTIVITAMIN WITH MINERALS) TABS tablet Take 2 tablets by mouth daily.   ondansetron (ZOFRAN) 4 MG tablet Take 1 tablet (4 mg total) by mouth every 8 (eight) hours as needed for  nausea or vomiting.   oxyCODONE-acetaminophen (PERCOCET) 5-325 MG tablet Take 1 tablet by mouth every 4 (four) hours as needed for severe pain.   sacubitril-valsartan (ENTRESTO) 49-51 MG Take 1 tablet by mouth 2 (two) times daily.   spironolactone (ALDACTONE) 25 MG tablet Take 1 tablet (25 mg total) by mouth at bedtime.   [DISCONTINUED] losartan (COZAAR) 50 MG tablet Take 1 tablet (50 mg total) by mouth at bedtime.   [DISCONTINUED] metoprolol succinate (TOPROL XL) 50 MG 24 hr tablet Take 1.5 tablets (75 mg total) by mouth at bedtime.     Allergies:    Codeine   Social History:   Social History   Tobacco Use   Smoking status: Every Day    Packs/day: 1.00    Years: 25.00    Total pack years: 25.00    Types: Cigarettes   Smokeless tobacco: Never  Vaping Use   Vaping  Use: Never used  Substance Use Topics   Alcohol use: Yes    Alcohol/week: 49.0 standard drinks of alcohol    Types: 49 Cans of beer per week   Drug use: No    Comment: hx of 34 years ago marijuana      Family Hx: Family History  Problem Relation Age of Onset   Heart disease Mother    High Cholesterol Mother    High blood pressure Mother    Depression Brother    Diabetes Brother      Review of Systems:   Please see the history of present illness.    All other systems reviewed and are negative.     EKGs/Labs/Other Test Reviewed:    EKG: None today  Prior CV studies:  Echocardiogram September 2023:  1. Left ventricular ejection fraction, by estimation, is 35 to 40%. The  left ventricle has moderately decreased function. The left ventricle  demonstrates global hypokinesis. The left ventricular internal cavity size  was mildly dilated. There is mild  left ventricular hypertrophy. Left ventricular diastolic parameters are  indeterminate.   2. Right ventricular systolic function is normal. The right ventricular  size is normal. Tricuspid regurgitation signal is inadequate for assessing  PA pressure.    3. Left atrial size was moderately dilated.   4. The mitral valve is normal in structure. Trivial mitral valve  regurgitation. No evidence of mitral stenosis.   5. The aortic valve is grossly normal. Aortic valve regurgitation is not  visualized. No aortic stenosis is present.   6. The inferior vena cava is normal in size with greater than 50%  respiratory variability, suggesting right atrial pressure of 3 mmHg.   Abdominal ultrasound: Abdominal aortic aneurysm  Carotid ultrasound 2023: Mild disease bilaterally  Echocardiogram April 2023 with ejection fraction of 35 to 40% with mild concentric left ventricular hypertrophy and no significant valvular abnormalities  Coronary angiography and right heart catheterization in January 2023 as detailed above  Other studies Reviewed: Review of the additional studies/records demonstrates: CT scan September 2022 demonstrating aortic atherosclerosis  Recent Labs: 03/04/2021: TSH 1.110 04/23/2021: Platelets 258 04/26/2021: Hemoglobin 13.3 07/30/2021: BUN 14; Creatinine, Ser 0.97; Potassium 4.3; Sodium 139 01/08/2022: ALT 18   Recent Lipid Panel Lab Results  Component Value Date/Time   CHOL 177 01/08/2022 09:46 AM   TRIG 229 (H) 01/08/2022 09:46 AM   HDL 50 01/08/2022 09:46 AM   LDLCALC 89 01/08/2022 09:46 AM    Risk Assessment/Calculations:          Physical Exam:    VS:  BP (!) 160/70   Pulse 100   Ht '5\' 7"'$  (1.702 m)   Wt 227 lb (103 kg)   SpO2 94%   BMI 35.55 kg/m     HYPERTENSION CONTROL Vitals:   01/15/22 0951 01/15/22 1034  BP: (!) 160/60 (!) 160/70    The patient's blood pressure is elevated above target today.  In order to address the patient's elevated BP: A current anti-hypertensive medication was adjusted today.; A new medication was prescribed today.       Wt Readings from Last 3 Encounters:  01/15/22 227 lb (103 kg)  07/26/21 218 lb (98.9 kg)  07/01/21 218 lb (98.9 kg)    GENERAL:  No apparent distress,  AOx3 HEENT:  No carotid bruits, +2 carotid impulses, no scleral icterus CAR: RRR no murmurs, gallops, rubs, or thrills RES:  Clear to auscultation bilaterally ABD:  Soft, nontender, nondistended, positive bowel sounds x 4  VASC:  +2 radial pulses, +2 carotid pulses, palpable pedal pulses NEURO:  CN 2-12 grossly intact; motor and sensory grossly intact PSYCH:  No active depression or anxiety EXT:  No edema, ecchymosis, or cyanosis  Signed, Early Osmond, MD  01/15/2022 10:34 AM    [facility] [place], [place], [place]  [address] Phone: [phone]; Fax: [phone]   Note:  This document was prepared using Dragon voice recognition software and may include unintentional dictation errors.

## 2022-01-09 LAB — ECHOCARDIOGRAM COMPLETE
AR max vel: 1.75 cm2
AV Area VTI: 1.66 cm2
AV Area mean vel: 1.89 cm2
AV Mean grad: 8 mmHg
AV Peak grad: 17.6 mmHg
Ao pk vel: 2.1 m/s
Area-P 1/2: 2.24 cm2
S' Lateral: 4.5 cm

## 2022-01-15 ENCOUNTER — Ambulatory Visit: Payer: Medicare Other | Attending: Internal Medicine | Admitting: Internal Medicine

## 2022-01-15 ENCOUNTER — Ambulatory Visit (INDEPENDENT_AMBULATORY_CARE_PROVIDER_SITE_OTHER): Payer: Medicare Other

## 2022-01-15 ENCOUNTER — Other Ambulatory Visit: Payer: Self-pay | Admitting: Internal Medicine

## 2022-01-15 ENCOUNTER — Telehealth: Payer: Self-pay | Admitting: Internal Medicine

## 2022-01-15 ENCOUNTER — Encounter: Payer: Self-pay | Admitting: Internal Medicine

## 2022-01-15 VITALS — BP 160/70 | HR 100 | Ht 67.0 in | Wt 227.0 lb

## 2022-01-15 DIAGNOSIS — I428 Other cardiomyopathies: Secondary | ICD-10-CM

## 2022-01-15 DIAGNOSIS — I700 Atherosclerosis of aorta: Secondary | ICD-10-CM

## 2022-01-15 DIAGNOSIS — I447 Left bundle-branch block, unspecified: Secondary | ICD-10-CM

## 2022-01-15 DIAGNOSIS — E785 Hyperlipidemia, unspecified: Secondary | ICD-10-CM | POA: Diagnosis not present

## 2022-01-15 DIAGNOSIS — Z720 Tobacco use: Secondary | ICD-10-CM

## 2022-01-15 MED ORDER — SACUBITRIL-VALSARTAN 49-51 MG PO TABS: 1.0000 | ORAL_TABLET | Freq: Two times a day (BID) | ORAL | Status: DC

## 2022-01-15 MED ORDER — METOPROLOL SUCCINATE ER 100 MG PO TB24: 100.0000 mg | ORAL_TABLET | Freq: Every day | ORAL | 3 refills | Status: DC

## 2022-01-15 MED ORDER — ENTRESTO 49-51 MG PO TABS: 1.0000 | ORAL_TABLET | Freq: Two times a day (BID) | ORAL | 3 refills | Status: DC

## 2022-01-15 NOTE — Progress Notes (Unsigned)
Enrolled for [employer] to mail a ZIO XT long term holter monitor to the patients address on file.  

## 2022-01-15 NOTE — Patient Instructions (Signed)
Medication Instructions:  Your physician has recommended you make the following change in your medication:   Stop taking losartan. Start taking Entresto 49-'51mg'$  2 times daily  Increase Toprol to '100mg'$  daily    *If you need a refill on your cardiac medications before your next appointment, please call your pharmacy*   Lab Work: Lipids, LPA at 3 mth follow up appt If you have labs (blood work) drawn today and your tests are completely normal, you will receive your results only by: [place] (if you have MyChart) OR A paper copy in the mail If you have any lab test that is abnormal or we need to change your treatment, we will call you to review the results.   Testing/Procedures: Your physician has recommended that you wear an event monitor. Event monitors are medical devices that record the heart's electrical activity. Doctors most often [place] these monitors to diagnose arrhythmias. Arrhythmias are problems with the speed or rhythm of the heartbeat. The monitor is a small, portable device. You can wear one while you do your normal daily activities. This is usually used to diagnose what is causing palpitations/syncope (passing out).    Follow-Up: At [facility], you and your health needs are our priority.  As part of our continuing mission to provide you with exceptional heart care, we have created designated Provider Care Teams.  These Care Teams include your primary Cardiologist (physician) and Advanced Practice Providers (APPs -  Physician Assistants and Nurse Practitioners) who all work together to provide you with the care you need, when you need it.  Your next appointment:   3 month(s)  The format for your next appointment:   In Person  Provider:   Early Osmond, MD     Other Instructions Bryn Gulling- Long Term Monitor Instructions  Your physician has requested you wear a ZIO patch monitor for 14 days.  This is a single patch monitor. [employer] supplies one patch  monitor per enrollment. Additional stickers are not available. Please do not apply patch if you will be having a Nuclear Stress Test,  Echocardiogram, Cardiac CT, MRI, or Chest Xray during the period you would be wearing the  monitor. The patch cannot be worn during these tests. You cannot remove and re-apply the  ZIO XT patch monitor.  Your ZIO patch monitor will be mailed 3 day USPS to your address on file. It may take 3-5 days  to receive your monitor after you have been enrolled.  Once you have received your monitor, please review the enclosed instructions. Your monitor  has already been registered assigning a specific monitor serial # to you.  Billing and Patient Assistance Program Information  We have supplied [employer] with any of your insurance information on file for billing purposes. [employer] offers a sliding scale Patient Assistance Program for patients that do not have  insurance, or whose insurance does not completely cover the cost of the ZIO monitor.  You must apply for the Patient Assistance Program to qualify for this discounted rate.  To apply, please call [employer] at [phone], select option 4, select option 2, ask to apply for  Patient Assistance Program. Theodore Demark will ask your household income, and how many people  are in your household. They will quote your out-of-pocket cost based on that information.  [employer] will also be able to set up a 37-month interest-free payment plan if needed.  Applying the monitor   Shave hair from upper left chest.  Hold abrader disc by  orange tab. Rub abrader in 40 strokes over the upper left chest as  indicated in your monitor instructions.  Clean area with 4 enclosed alcohol pads. Let dry.  Apply patch as indicated in monitor instructions. Patch will be placed under collarbone on left  side of chest with arrow pointing upward.  Rub patch adhesive wings for 2 minutes. Remove white label marked "1". Remove the white  label marked "2".  Rub patch adhesive wings for 2 additional minutes.  While looking in a mirror, press and release button in center of patch. A small green light will  flash 3-4 times. This will be your only indicator that the monitor has been turned on.  Do not shower for the first 24 hours. You may shower after the first 24 hours.  Press the button if you feel a symptom. You will hear a small click. Record Date, Time and  Symptom in the Patient Logbook.  When you are ready to remove the patch, follow instructions on the last 2 pages of Patient  Logbook. Stick patch monitor onto the last page of Patient Logbook.  Place Patient Logbook in the blue and white box. Use locking tab on box and tape box closed  securely. The blue and white box has prepaid postage on it. Please place it in the mailbox as  soon as possible. Your physician should have your test results approximately 7 days after the  monitor has been mailed back to [facility].  Call [place] at [phone] if you have questions regarding  your ZIO XT patch monitor. Call them immediately if you see an orange light blinking on your  monitor.  If your monitor falls off in less than 4 days, contact our Monitor department at [phone].  If your monitor becomes loose or falls off after 4 days call [employer] at [phone] for  suggestions on securing your monitor

## 2022-01-15 NOTE — Telephone Encounter (Signed)
I called and spoke with the patient. She was ordered [facility] today but did not get the free trial card. She has the information for patient assistance.  She will contact the company to ask for an application.  Her daughter will help her get the 30 day trial card online.  She also is going to apply for [place] assistance.

## 2022-01-15 NOTE — Telephone Encounter (Signed)
  Pt c/o medication issue:  1. Name of Medication: sacubitril-valsartan (ENTRESTO) 49-51 MG  2. How are you currently taking this medication (dosage and times per day)? Take 1 tablet by mouth 2 (two) times daily.  3. Are you having a reaction (difficulty breathing--STAT)? No   4. What is your medication issue? Pt said, her pharmacy called about her entresto and its going to cost her $460.90 to pick it up. She said she cant afford this medication

## 2022-01-19 DIAGNOSIS — E785 Hyperlipidemia, unspecified: Secondary | ICD-10-CM | POA: Diagnosis not present

## 2022-01-19 DIAGNOSIS — I447 Left bundle-branch block, unspecified: Secondary | ICD-10-CM

## 2022-01-19 DIAGNOSIS — I700 Atherosclerosis of aorta: Secondary | ICD-10-CM | POA: Diagnosis not present

## 2022-01-19 DIAGNOSIS — I428 Other cardiomyopathies: Secondary | ICD-10-CM | POA: Diagnosis not present

## 2022-01-22 ENCOUNTER — Telehealth: Payer: Self-pay | Admitting: Pulmonary Disease

## 2022-01-22 NOTE — Telephone Encounter (Signed)
Patient dropped off form for Katrina [name] today.  When icard comes to office print off Rx for Katrina [name] and fax with paperwork to the patient assistance place. Make sure in the packet Dr Katrina [name] doesn't have to sign anywhere.   If Katrina [name] have any questions you can ask me  Thank you

## 2022-01-23 ENCOUNTER — Telehealth: Payer: Self-pay

## 2022-01-23 NOTE — Telephone Encounter (Signed)
**Note De-Identified Katrina [name] Obfuscation** The pt left her [employer] pt asst application at the office for [place] assistance and a [employer] pt asst application for Jardiance assistance at the office.  I have completed the providers page of the [employer] application and have e-mailed it to the nurse working with Dr Ali Lowe on Monday 10/23 so she can obtain his signature and to fax all to [employer] at the fax number written on the cover letter included.  Her [employer] application is missing 2 pages which are pg. 2 (Ins. Info and the pt attestation) and pg. 4 (providers page).  I have printed the providers page of a [employer] Pt Asst application and completed it. I called the pt to discuss the missing pg.2 of her [employer] application but got no answer so I left a message on her VM asking her to call Jeani Hawking at Dr Dara Hoyer office concerning the applications for assistnace that she left at the office yesterday.

## 2022-01-23 NOTE — Telephone Encounter (Signed)
Patient was returning your call. Please advise  

## 2022-01-24 NOTE — Telephone Encounter (Signed)
**Note De-Identified Mazzie Brodrick Obfuscation** The pt states that she will ask her daughter to print page 2 of a [employer] Pt Asst Application which is her Ins. Info and the pt attestation page and she is aware hat she will sign and date it and then bring it to the office to drop off. She is aware also, that once we fax her applications to each foundation that she should call to check the progress of each of her applications which will help get a determination quicker.  She verbalized understanding to all of the above and thanked me calling her to discuss.

## 2022-01-27 NOTE — Telephone Encounter (Signed)
Patient assistance applications for Delene Loll and Jardiance signed and faxed.

## 2022-01-27 NOTE — Telephone Encounter (Signed)
**Note De-Identified Eyleen Rawlinson Obfuscation** The pt left page 2 of her [employer] pt asst application for Jardiance assistance at the office with her [employer] account statement. I have e-mailed all to the nurse working with Dr Ali Lowe today so she can obtain his signature, date it, and to fax to [place] at the fax number written on the cover letter included.

## 2022-01-30 NOTE — Telephone Encounter (Addendum)
**Note De-Identified Maurisa Tesmer Obfuscation** I have faxed the pts proof of income to [employer] per request received Nick Stults letter. Fax: Tx 'ok' Report CONE_EMAIL-to-Fax  Rosealee Recinos, Mardene Celeste    This message was sent [facility], a product from [employer] http://www.biscom.com/  [employer] pioneered [employer] and continues to innovate the most advanced and intelligent fax and secure messaging solutions for enterprises. www.biscom.com                     -------Fax Transmission Report-------  To:               Recipient at [phone] Subject:          Fw: Proof Of Income Result:           The transmission was successful. Explanation:      All Pages Ok Pages Sent:       6 Connect Time:     6 minutes, 40 seconds Transmit Time:    01/30/2022 08:28 Transfer Rate:    14400 Status Code:      0000 Retry Count:      0 Job Id:           [id number] Unique Id:        [id number] Fax Line:         38 Fax Server:       [id number]

## 2022-02-05 NOTE — Telephone Encounter (Signed)
Found faxed forms in BI's cabinet along with a confirmation fax. Will close encounter.

## 2022-02-06 DIAGNOSIS — I428 Other cardiomyopathies: Secondary | ICD-10-CM | POA: Diagnosis not present

## 2022-02-06 DIAGNOSIS — I447 Left bundle-branch block, unspecified: Secondary | ICD-10-CM | POA: Diagnosis not present

## 2022-02-20 ENCOUNTER — Ambulatory Visit (INDEPENDENT_AMBULATORY_CARE_PROVIDER_SITE_OTHER): Payer: Medicare Other | Admitting: Orthopedic Surgery

## 2022-02-20 ENCOUNTER — Ambulatory Visit (INDEPENDENT_AMBULATORY_CARE_PROVIDER_SITE_OTHER): Payer: Medicare Other

## 2022-02-20 ENCOUNTER — Ambulatory Visit: Payer: Self-pay

## 2022-02-20 VITALS — BP 149/93 | HR 45 | Ht 67.0 in | Wt 227.0 lb

## 2022-02-20 DIAGNOSIS — G8929 Other chronic pain: Secondary | ICD-10-CM | POA: Diagnosis not present

## 2022-02-20 DIAGNOSIS — M545 Low back pain, unspecified: Secondary | ICD-10-CM | POA: Diagnosis not present

## 2022-02-20 NOTE — Progress Notes (Signed)
Orthopedic Spine Surgery Office Note  Assessment: Patient is a 66 y.o. female with exacerbation of chronic low back pain. No radicular pain. Has disc height loss at multiple levels, facet arthropathy, and loss of lumbar lordosis.    Plan: -Explained that initially conservative treatment is tried as a significant number of patients may experience relief with these treatment modalities. Discussed that the conservative treatments include:  -activity modification  -physical therapy  -over the counter pain medications  -medrol dosepak  -lumbar steroid injections -Patient has tried activity modification, over the counter pain medications  -Recommended physical therapy.  A referral was provided to her.  Encouraged her to do core strengthening exercises at least 3-4 times per week. -I explained how weight loss could also help with some of her chronic back pain.  She is going to try to lose some weight -We talked about using Voltaren gel over her back to help with some of the pain and to gradually increase the pressure applied to the area for desensitization -Discussed that if surgical management ever becomes considered, she would need to be nicotine free for 8 weeks prior to surgery. Her BMI is 35 so that okay as long as she stays at roughly 225 pounds -Patient should return to office in 6 weeks, repeat x-rays of lumbar spine at next visit: scoli films   Patient expressed understanding of the plan and all questions were answered to the patient's satisfaction.   ___________________________________________________________________________   History:  Patient is a 66 y.o. female who presents today for lumbar spine.  Patient has a history of 2 lumbar decompressions by another surgeon who comes in with exacerbation of her chronic low back pain.  States she has been having to do a lot of work around the house and help her husband who is in bed most of the day.  She had to do a lot of extra bending and  twisting as a result of the increased work she has had to do.  Her pain is felt in her lower back.  Does not radiate into her legs.  It is worse with activity.  It does help if she lays down for couple hours.  She has some decrease sensation in the great toe on the right side.  No other numbness or paresthesias   Weakness: Denies Symptoms of imbalance: Yes, feels she is falling forward Paresthesias and numbness: Yes, and right great toe.  No other numbness or paresthesias Bowel or bladder incontinence: Denies Saddle anesthesia: Denies Clumsiness with hands: Denies  Treatments tried: Activity modification, over-the-counter pain medications  Review of systems: Denies fevers and chills, night sweats, unexplained weight loss, history of cancer, pain that wakes them at night  Past medical history: Osteoarthritis Asthma COPD Depression GERD Headache LBBB  Allergies: Codeine  Past surgical history:  Right C5/6 foraminotomies Hysterectomy Bilateral knee replacement Left total hip replacement Left carpal tunnel release Lumbar decompression x2 C-section x2 Cholecystectomy Knee arthroscopy multiple times Left cubital tunnel release Laparoscopy Bunion surgery bilateral  Social history: Reports use of nicotine product (smoking, vaping, patches, smokeless) Alcohol use: Denies Denies recreational drug use   Physical Exam:  General: no acute distress, appears stated age Neurologic: alert, answering questions appropriately, following commands Respiratory: unlabored breathing on room air, symmetric chest rise Psychiatric: appropriate affect, normal cadence to speech   MSK (spine):  -Strength exam      Left  Right EHL    -/5  -/5 TA    5/5  5/5 GSC  5/5  5/5 Knee extension  5/5  5/5 Hip flexion   5/5  5/5  Has had bunion surgery on bilateral feet still unable to test EHL  -Sensory exam    Sensation intact to light touch in L3-S1 nerve distributions of bilateral  lower extremities  -Achilles DTR: 2/4 on the left, 2/4 on the right -Patellar tendon DTR: 2/4 on the left, 2/4 on the right  -Straight leg raise: Negative -Contralateral straight leg raise: Negative -Clonus: no beats bilaterally -Slow but normal gait -Negative Hoffmann bilaterally  -Left hip exam: No pain through range of motion, negative Stinchfield -Right hip exam: Pain with internal rotation, positive Stinchfield, no pain with external rotation.  The pain experienced was not the same as what she is presenting for  -Prior surgical incision over the lower lumbar spine appears well-healed with no evidence of infection -Tender to palpation even superficially over the lower lumbar spine and paraspinal muscles.  Winces in pain with a superficial palpation  Imaging: XR of the lumbar spine from 02/20/2022 was independently reviewed and interpreted, showing disc height loss at L1/2, L4/5, and L5/S1. Loss of lumbar lordosis. PI of 30, LL of 12   Patient name: Katrina [name] Patient MRN: [id number] Date of visit: 02/20/22

## 2022-03-05 ENCOUNTER — Encounter: Payer: Self-pay | Admitting: Rehabilitative and Restorative Service Providers"

## 2022-03-05 ENCOUNTER — Other Ambulatory Visit: Payer: Self-pay

## 2022-03-05 ENCOUNTER — Ambulatory Visit: Payer: Medicare Other | Admitting: Physical Therapy

## 2022-03-05 ENCOUNTER — Ambulatory Visit (INDEPENDENT_AMBULATORY_CARE_PROVIDER_SITE_OTHER): Payer: Medicare Other | Admitting: Rehabilitative and Restorative Service Providers"

## 2022-03-05 DIAGNOSIS — M5459 Other low back pain: Secondary | ICD-10-CM | POA: Diagnosis not present

## 2022-03-05 DIAGNOSIS — R262 Difficulty in walking, not elsewhere classified: Secondary | ICD-10-CM | POA: Diagnosis not present

## 2022-03-05 DIAGNOSIS — R293 Abnormal posture: Secondary | ICD-10-CM

## 2022-03-05 DIAGNOSIS — M6281 Muscle weakness (generalized): Secondary | ICD-10-CM

## 2022-03-05 NOTE — Therapy (Signed)
OUTPATIENT PHYSICAL THERAPY EVALUATION   Patient Name: Katrina [name] MRN: [id number] DOB:1956/01/04, 66 y.o., female Today's Date: 03/05/2022  END OF SESSION:  PT End of Session - 03/05/22 1255     Visit Number 1    Number of Visits 20    Date for PT Re-Evaluation 05/14/22    Authorization Type [employer] Medicare $10 copay    Progress Note Due on Visit 10    PT Start Time 1301    PT Stop Time 1340    PT Time Calculation (min) 39 min    Activity Tolerance Patient limited by pain    Behavior During Therapy [facility] for tasks assessed/performed             Past Medical History:  Diagnosis Date   Anxiety    Arthritis    knees, hips, elbows    Asthma    hx of asthma 3/12- hospitalized    COPD (chronic obstructive pulmonary disease) ([place])    Depression    GERD (gastroesophageal reflux disease)    PRIOR TO HAVING GALLBLADDER REMOVED   Headache    from her neck   Left bundle branch block 06/28/2010   Lung infection 2015   Lung infection    Past Surgical History:  Procedure Laterality Date   BACK SURGERY     x2   CARPAL TUNNEL RELEASE     left    CESAREAN SECTION     x 2   CHOLECYSTECTOMY     COLONOSCOPY  10/2017   DILATATION & CURETTAGE/HYSTEROSCOPY WITH MYOSURE N/A 12/10/2017   Procedure: DILATATION & CURETTAGE/HYSTEROSCOPY WITH MYOSURE;  Surgeon: Christophe Louis, MD;  Location: [facility];  Service: Gynecology;  Laterality: N/A;  POSSIBLE MYOMECTOMY VS POLYPECTOMY WITH MYOSURE   DILATION AND CURETTAGE OF UTERUS     ELBOW SURGERY     JOINT REPLACEMENT     KNEE ARTHROSCOPY     bilateral x 2    KNEE ARTHROSCOPY Left 11/03/2013   Procedure: LEFT KNEE ARTHROSCOPY WITH DEBRIDEMENT, PARTIAL SYNOVECTOMY;  Surgeon: Mcarthur Rossetti, MD;  Location: [facility];  Service: Orthopedics;  Laterality: Left;   LAPAROSCOPY     left elbow surgery      OTHER SURGICAL HISTORY     arthroswcopic surgery right knee    OTHER SURGICAL HISTORY     arthroscopic surgery left knee x 2    OTHER  SURGICAL HISTORY     bunionectomy right foot    OTHER SURGICAL HISTORY     C Section x 2    OTHER SURGICAL HISTORY     carpal tunnel on left    POSTERIOR CERVICAL FUSION/FORAMINOTOMY N/A 04/02/2020   Procedure: RIGHT C5-6 FORAMINOTOMY;  Surgeon: Jessy Oto, MD;  Location: [facility];  Service: Orthopedics;  Laterality: N/A;   RADIOLOGY WITH ANESTHESIA N/A 07/28/2019   Procedure: MRI WITH ANESTHESIA OF NECK SOFT TISSUE ONLY WITH AND WITHOUT CONTRAST;  Surgeon: Radiologist, Medication, MD;  Location: [facility];  Service: Radiology;  Laterality: N/A;   RADIOLOGY WITH ANESTHESIA N/A 10/04/2019   Procedure: MRI WITH ANESTHESIA RIGHT SHOULDER WITHOUT CONTRAST,MRI OF CERVICAL SPINE WITHOUT CONTRAST;  Surgeon: Radiologist, Medication, MD;  Location: [facility];  Service: Radiology;  Laterality: N/A;   RADIOLOGY WITH ANESTHESIA N/A 08/30/2020   Procedure: MRI WITH ANESTHESIA CERVICAL SPINE WITHOUT CONTRAST;  Surgeon: Radiologist, Medication, MD;  Location: [facility];  Service: Radiology;  Laterality: N/A;   right foot bunionectomy      RIGHT/LEFT HEART CATH AND CORONARY ANGIOGRAPHY  N/A 04/26/2021   Procedure: RIGHT/LEFT HEART CATH AND CORONARY ANGIOGRAPHY;  Surgeon: Early Osmond, MD;  Location: [facility];  Service: Cardiovascular;  Laterality: N/A;   ROBOTIC ASSISTED TOTAL HYSTERECTOMY WITH BILATERAL SALPINGO OOPHERECTOMY Bilateral 06/16/2018   Procedure: XI ROBOTIC ASSISTED TOTAL HYSTERECTOMY WITH RIGHT SALPINGO OOPHORECTOMY;  Surgeon: Christophe Louis, MD;  Location: [facility];  Service: Gynecology;  Laterality: Bilateral;   TOTAL HIP ARTHROPLASTY  05/02/2011   Procedure: TOTAL HIP ARTHROPLASTY ANTERIOR APPROACH;  Surgeon: Mcarthur Rossetti, MD;  Location: [facility];  Service: Orthopedics;  Laterality: Left;  Left Total Hip Arthroplasty, Direct Anterior Approach   TOTAL KNEE ARTHROPLASTY Right 12/03/2012   Procedure: RIGHT TOTAL KNEE ARTHROPLASTY;  Surgeon: Mcarthur Rossetti, MD;  Location:  [facility];  Service: Orthopedics;  Laterality: Right;   TOTAL KNEE ARTHROPLASTY Left 12/15/2014   Procedure: LEFT TOTAL KNEE ARTHROPLASTY;  Surgeon: Mcarthur Rossetti, MD;  Location: [facility];  Service: Orthopedics;  Laterality: Left;   TUBAL LIGATION     ULNAR NERVE TRANSPOSITION     left    UPPER GI ENDOSCOPY     x 2   Patient Active Problem List   Diagnosis Date Noted   Chronic systolic heart failure ([place]) 05/21/2021   Leukocytosis 03/07/2021   Mixed hyperlipidemia 03/07/2021   Left bundle branch block 02/20/2021   Left axis deviation 02/20/2021   Acquired hallux varus of left foot 02/20/2021   Porokeratosis 12/31/2020   Sesamoiditis 12/31/2020   Hav (hallux abducto valgus), left 12/31/2020   Encounter to establish care 12/16/2020   Cigarette smoker motivated to quit 12/16/2020   Callus of foot 12/16/2020   Body mass index (BMI) of 34.0-34.9 in adult 12/16/2020   Other spondylosis with radiculopathy, cervical region 04/02/2020    Class: Chronic   Pain in finger of left hand 02/24/2020   Lung nodules 11/30/2019   Neck pain 08/04/2019   Acute pain of right shoulder 04/27/2019   Neck mass 04/27/2019   Postmenopausal bleeding 06/16/2018   S/P hysterectomy 06/16/2018   Chronic right shoulder pain 10/28/2016   Impingement syndrome of right shoulder 10/28/2016   Osteoarthritis of left knee 12/15/2014   Status post total left knee replacement 12/15/2014   Hand pain, left 04/21/2014   Cat bite of finger 04/21/2014   Asthma 04/21/2014   Tobacco abuse 04/21/2014   COPD (chronic obstructive pulmonary disease) ([place])    Effusion of left knee joint 11/03/2013   Arthritis of right knee 12/03/2012   Degenerative arthritis of hip 05/02/2011    PCP: Leretha Pol NP  REFERRING PROVIDER: Callie Fielding, MD  REFERRING DIAG: M54.50,G89.29 (ICD-10-CM) - Chronic midline low back pain without sciatica  Rationale for Evaluation and Treatment: Rehabilitation  THERAPY DIAG:  Other  low back pain  Muscle weakness (generalized)  Difficulty in walking, not elsewhere classified  Abnormal posture  ONSET DATE: approx. 6 months acute Chronic symptoms "for years"  SUBJECTIVE:  SUBJECTIVE STATEMENT: Pt indicated having a fall that resulted in several body parts hurting including knee and back.  Pt. Indicated back has been hurting more since.  Pt indicated Voltaren gel can help.  Pt indicated daily she has to help husband with mobility during the day that causes trouble.  PT indicated sleeping is interrupted but she's not sure what interrupts it (heart or back).   Pt indicated having numbness on Rt foot 1st and 2nd digit.  Denied bowel or bladder changes.   PERTINENT HISTORY:  History of 2 lumbar decompression surgeries (at least 20 years since).  Lt THA, bilateral TKA, Arthritis, COPD, depression, GERD, see above list  PAIN:  NPRS scale: at current 5/10, at worst 10/10 , at best 0/10 Pain location: low back Pain description: push pressure, compression, sharp pains at times Aggravating factors: washing dishes, prolonged standing, prolonged walking, prolonged sitting Relieving factors: lying down, voltaren gel, OTC medicine  PRECAUTIONS: None  WEIGHT BEARING RESTRICTIONS: No  FALLS:  Has patient fallen in last 6 months? Maybe 1 fall (couldn't remember if within 6 months )  LIVING ENVIRONMENT: Lives with: lives with their spouse Lives in: House/apartment Stairs: steps to enter house 2-3 with not rails   OCCUPATION: Disability  PLOF: Independent, caregiver for husband, wants to walk in fields  PATIENT GOALS: Reduce pain, help husband better without pain   OBJECTIVE:   PATIENT SURVEYS:  03/05/2022 FOTO eval: 43   predicted:  50  SCREENING FOR RED FLAGS: 03/05/2022 Bowel or  bladder incontinence: No Cauda equina syndrome: No  COGNITION: 03/05/2022 Overall cognitive status: WFL normal      SENSATION: 03/05/2022 No specific testing today  MUSCLE LENGTH: 03/05/2022 No specific testing today  POSTURE: 03/05/2022 Mild forward trunk lean, forward head posture.  Reduced lumbar lordosis in standing  PALPATION: 03/05/2022 Light touch tenderness throughout lower thoracic and lumbar region grossly  LUMBAR ROM:   AROM 03/05/2022  Flexion Movement to mid shin c relief noted in end range.  Aberrant movement with gower's sign upon return c pain in movement to neutral  Extension To neutral.  Pt was reluctant to perform lumbar extension due to pain  Right lateral flexion Movement to mid thigh c back pain  Left lateral flexion Movement to mid thigh c back pain  Right rotation   Left rotation    (Blank rows = not tested)  LOWER EXTREMITY ROM:      Right 03/05/2022 Left 03/05/2022  Hip flexion    Hip extension    Hip abduction    Hip adduction    Hip internal rotation    Hip external rotation    Knee flexion    Knee extension    Ankle dorsiflexion    Ankle plantarflexion    Ankle inversion    Ankle eversion     (Blank rows = not tested)  LOWER EXTREMITY MMT:    MMT Right 03/05/2022 Left 03/05/2022  Hip flexion 5/5 5/5  Hip extension    Hip abduction    Hip adduction    Hip internal rotation    Hip external rotation    Knee flexion 5/5 5/5  Knee extension 5/5 5/5  Ankle dorsiflexion 5/5 5/5  Ankle plantarflexion        Lumbar extensors   Weakness noted in return to standing in flexion mobility   (Blank rows = not tested)  LUMBAR SPECIAL TESTS:  03/05/2022 (+) slump bilateral for posterior leg pulling but not concordant symptoms  FUNCTIONAL TESTS:  03/05/2022 Unable to stand from 18 inch chair s UE assist.   GAIT: 03/05/2022 Independent ambulation  TODAY'S TREATMENT:                                                                                                                               DATE: 03/05/2022  Therex:    HEP instruction/performance c cues for techniques, handout provided.  Trial set performed of each for comprehension and symptom assessment.  See below for exercise list  PATIENT EDUCATION:  03/05/2022 Education details: HEP, POC Person educated: Patient Education method: Explanation, Demonstration, Verbal cues, and Handouts Education comprehension: verbalized understanding, returned demonstration, and verbal cues required  HOME EXERCISE PROGRAM: Access Code: [email or web address] URL: [email or web address] Date: 03/05/2022 Prepared by: Scot Jun  Exercises - Supine Lower Trunk Rotation  - 2-3 x daily - 7 x weekly - 1 sets - 3-5 reps - 15 hold - Hooklying Gluteal Sets  - 2-3 x daily - 7 x weekly - 1 sets - 10 reps - 5 hold - Seated Multifidi Isometric  - 2-3 x daily - 7 x weekly - 1 sets - 10 reps - 5-10 hold - Seated Abdominal Press into [employer]  - 2-3 x daily - 7 x weekly - 1 sets - 10 reps - 5-10 hold - Seated Scapular Retraction  - 2-3 x daily - 7 x weekly - 1 sets - 10 reps - 3-5 hold  ASSESSMENT:  CLINICAL IMPRESSION: Patient is a 66 y.o. who comes to clinic with complaints of low back pain with mobility, strength and movement coordination deficits that impair their ability to perform usual daily and recreational functional activities without increase difficulty/symptoms at this time.  Patient to benefit from skilled PT services to address impairments and limitations to improve to previous level of function without restriction secondary to condition.   OBJECTIVE IMPAIRMENTS: Abnormal gait, decreased activity tolerance, decreased balance, decreased coordination, decreased endurance, decreased mobility, difficulty walking, decreased ROM, decreased strength, hypomobility, increased fascial restrictions, impaired perceived functional ability, increased muscle spasms, impaired  flexibility, improper body mechanics, postural dysfunction, and pain.   ACTIVITY LIMITATIONS: carrying, lifting, bending, sitting, standing, squatting, sleeping, stairs, transfers, bed mobility, bathing, toileting, dressing, reach over head, locomotion level, and caring for others  PARTICIPATION LIMITATIONS: meal prep, cleaning, laundry, interpersonal relationship, shopping, community activity, and yard work  PERSONAL FACTORS:  Arthritis, COPD, depression, GERD, length of time since onset, severity of condition, long history of surgeries for back and BLE  are also affecting patient's functional outcome.   REHAB POTENTIAL: Fair to good  CLINICAL DECISION MAKING: Evolving/moderate complexity  EVALUATION COMPLEXITY: Moderate   GOALS: Goals reviewed with patient? Yes  SHORT TERM GOALS: (target date for Short term goals are 3 weeks 03/26/2022)  1. Patient will demonstrate independent use of home exercise program to maintain progress from in clinic treatments.  Goal status: New  LONG TERM GOALS: (target dates for all long term goals are 10  weeks  05/14/2022 )   1. Patient will demonstrate/report pain at worst less than or equal to 2/10 to facilitate minimal limitation in daily activity secondary to pain symptoms.  Goal status: New   2. Patient will demonstrate independent use of home exercise program to facilitate ability to maintain/progress functional gains from skilled physical therapy services.  Goal status: New   3. Patient will demonstrate FOTO outcome > or = 50 % to indicate reduced disability due to condition.  Goal status: New   4. Patient will demonstrate lumbar extension 100 % WFL s symptoms to facilitate upright standing, walking posture at PLOF s limitation.  Goal status: New   5.  Patient will demonstrate sit to stand to sit from 18 inch chair s UE assist to show improved strength and mobility in daily life, reduced fall risk.   Goal status: New   6.  Patient will  demonstrate/report ability to perform caregiver activity s increased symptoms.  Goal status: New    PLAN:  PT FREQUENCY: 1-2x/week  PT DURATION: 10 weeks  PLANNED INTERVENTIONS: Therapeutic exercises, Therapeutic activity, Neuro Muscular re-education, Balance training, Gait training, Patient/Family education, Joint mobilization, Stair training, DME instructions, Dry Needling, Electrical stimulation, Cryotherapy, vasopneumatic device, Moist heat, Taping, Traction Ultrasound, Ionotophoresis '4mg'$ /ml Dexamethasone, and Manual therapy.  All included unless contraindicated  PLAN FOR NEXT SESSION: Review HEP knowledge/results.   Easy progressive postural strengthening and lumbar/hip mobility gains.     Scot Jun, PT, DPT, OCS, ATC 03/05/22  2:06 PM

## 2022-03-10 ENCOUNTER — Encounter: Payer: Self-pay | Admitting: Physical Therapy

## 2022-03-18 ENCOUNTER — Encounter: Payer: Self-pay | Admitting: Physical Therapy

## 2022-03-18 ENCOUNTER — Ambulatory Visit (INDEPENDENT_AMBULATORY_CARE_PROVIDER_SITE_OTHER): Payer: Medicare Other | Admitting: Physical Therapy

## 2022-03-18 DIAGNOSIS — M6281 Muscle weakness (generalized): Secondary | ICD-10-CM | POA: Diagnosis not present

## 2022-03-18 DIAGNOSIS — R262 Difficulty in walking, not elsewhere classified: Secondary | ICD-10-CM | POA: Diagnosis not present

## 2022-03-18 DIAGNOSIS — M5459 Other low back pain: Secondary | ICD-10-CM

## 2022-03-18 DIAGNOSIS — R293 Abnormal posture: Secondary | ICD-10-CM

## 2022-03-18 NOTE — Therapy (Signed)
OUTPATIENT PHYSICAL THERAPY TREATMENT NOTE   Patient Name: Katrina [name] MRN: [id number] DOB:Nov 13, 1955, 66 y.o., female Today's Date: 03/18/2022  PCP: Leretha Pol NP  REFERRING PROVIDER: Callie Fielding, MD    END OF SESSION:   PT End of Session - 03/18/22 1517     Visit Number 2    Number of Visits 20    Date for PT Re-Evaluation 05/14/22    Authorization Type [employer] Medicare $10 copay    Progress Note Due on Visit 10    PT Start Time 1430    PT Stop Time 1515    PT Time Calculation (min) 45 min    Activity Tolerance Patient limited by pain    Behavior During Therapy [facility] for tasks assessed/performed             Past Medical History:  Diagnosis Date   Anxiety    Arthritis    knees, hips, elbows    Asthma    hx of asthma 3/12- hospitalized    COPD (chronic obstructive pulmonary disease) ([place])    Depression    GERD (gastroesophageal reflux disease)    PRIOR TO HAVING GALLBLADDER REMOVED   Headache    from her neck   Left bundle branch block 06/28/2010   Lung infection 2015   Lung infection    Past Surgical History:  Procedure Laterality Date   BACK SURGERY     x2   CARPAL TUNNEL RELEASE     left    CESAREAN SECTION     x 2   CHOLECYSTECTOMY     COLONOSCOPY  10/2017   DILATATION & CURETTAGE/HYSTEROSCOPY WITH MYOSURE N/A 12/10/2017   Procedure: DILATATION & CURETTAGE/HYSTEROSCOPY WITH MYOSURE;  Surgeon: Christophe Louis, MD;  Location: [facility];  Service: Gynecology;  Laterality: N/A;  POSSIBLE MYOMECTOMY VS POLYPECTOMY WITH MYOSURE   DILATION AND CURETTAGE OF UTERUS     ELBOW SURGERY     JOINT REPLACEMENT     KNEE ARTHROSCOPY     bilateral x 2    KNEE ARTHROSCOPY Left 11/03/2013   Procedure: LEFT KNEE ARTHROSCOPY WITH DEBRIDEMENT, PARTIAL SYNOVECTOMY;  Surgeon: Mcarthur Rossetti, MD;  Location: [facility];  Service: Orthopedics;  Laterality: Left;   LAPAROSCOPY     left elbow surgery      OTHER SURGICAL HISTORY     arthroswcopic surgery right knee     OTHER SURGICAL HISTORY     arthroscopic surgery left knee x 2    OTHER SURGICAL HISTORY     bunionectomy right foot    OTHER SURGICAL HISTORY     C Section x 2    OTHER SURGICAL HISTORY     carpal tunnel on left    POSTERIOR CERVICAL FUSION/FORAMINOTOMY N/A 04/02/2020   Procedure: RIGHT C5-6 FORAMINOTOMY;  Surgeon: Jessy Oto, MD;  Location: [facility];  Service: Orthopedics;  Laterality: N/A;   RADIOLOGY WITH ANESTHESIA N/A 07/28/2019   Procedure: MRI WITH ANESTHESIA OF NECK SOFT TISSUE ONLY WITH AND WITHOUT CONTRAST;  Surgeon: Radiologist, Medication, MD;  Location: [facility];  Service: Radiology;  Laterality: N/A;   RADIOLOGY WITH ANESTHESIA N/A 10/04/2019   Procedure: MRI WITH ANESTHESIA RIGHT SHOULDER WITHOUT CONTRAST,MRI OF CERVICAL SPINE WITHOUT CONTRAST;  Surgeon: Radiologist, Medication, MD;  Location: [facility];  Service: Radiology;  Laterality: N/A;   RADIOLOGY WITH ANESTHESIA N/A 08/30/2020   Procedure: MRI WITH ANESTHESIA CERVICAL SPINE WITHOUT CONTRAST;  Surgeon: Radiologist, Medication, MD;  Location: [place];  Service: Radiology;  Laterality: N/A;  right foot bunionectomy      RIGHT/LEFT HEART CATH AND CORONARY ANGIOGRAPHY N/A 04/26/2021   Procedure: RIGHT/LEFT HEART CATH AND CORONARY ANGIOGRAPHY;  Surgeon: Early Osmond, MD;  Location: [facility];  Service: Cardiovascular;  Laterality: N/A;   ROBOTIC ASSISTED TOTAL HYSTERECTOMY WITH BILATERAL SALPINGO OOPHERECTOMY Bilateral 06/16/2018   Procedure: XI ROBOTIC ASSISTED TOTAL HYSTERECTOMY WITH RIGHT SALPINGO OOPHORECTOMY;  Surgeon: Christophe Louis, MD;  Location: [facility];  Service: Gynecology;  Laterality: Bilateral;   TOTAL HIP ARTHROPLASTY  05/02/2011   Procedure: TOTAL HIP ARTHROPLASTY ANTERIOR APPROACH;  Surgeon: Mcarthur Rossetti, MD;  Location: [facility];  Service: Orthopedics;  Laterality: Left;  Left Total Hip Arthroplasty, Direct Anterior Approach   TOTAL KNEE ARTHROPLASTY Right 12/03/2012   Procedure: RIGHT  TOTAL KNEE ARTHROPLASTY;  Surgeon: Mcarthur Rossetti, MD;  Location: [facility];  Service: Orthopedics;  Laterality: Right;   TOTAL KNEE ARTHROPLASTY Left 12/15/2014   Procedure: LEFT TOTAL KNEE ARTHROPLASTY;  Surgeon: Mcarthur Rossetti, MD;  Location: [facility];  Service: Orthopedics;  Laterality: Left;   TUBAL LIGATION     ULNAR NERVE TRANSPOSITION     left    UPPER GI ENDOSCOPY     x 2   Patient Active Problem List   Diagnosis Date Noted   Chronic systolic heart failure ([place]) 05/21/2021   Leukocytosis 03/07/2021   Mixed hyperlipidemia 03/07/2021   Left bundle branch block 02/20/2021   Left axis deviation 02/20/2021   Acquired hallux varus of left foot 02/20/2021   Porokeratosis 12/31/2020   Sesamoiditis 12/31/2020   Hav (hallux abducto valgus), left 12/31/2020   Encounter to establish care 12/16/2020   Cigarette smoker motivated to quit 12/16/2020   Callus of foot 12/16/2020   Body mass index (BMI) of 34.0-34.9 in adult 12/16/2020   Other spondylosis with radiculopathy, cervical region 04/02/2020    Class: Chronic   Pain in finger of left hand 02/24/2020   Lung nodules 11/30/2019   Neck pain 08/04/2019   Acute pain of right shoulder 04/27/2019   Neck mass 04/27/2019   Postmenopausal bleeding 06/16/2018   S/P hysterectomy 06/16/2018   Chronic right shoulder pain 10/28/2016   Impingement syndrome of right shoulder 10/28/2016   Osteoarthritis of left knee 12/15/2014   Status post total left knee replacement 12/15/2014   Hand pain, left 04/21/2014   Cat bite of finger 04/21/2014   Asthma 04/21/2014   Tobacco abuse 04/21/2014   COPD (chronic obstructive pulmonary disease) ([place])    Effusion of left knee joint 11/03/2013   Arthritis of right knee 12/03/2012   Degenerative arthritis of hip 05/02/2011    REFERRING DIAG:  M54.50,G89.29 (ICD-10-CM) - Chronic midline low back pain without sciatica   THERAPY DIAG:  Other low back pain  Muscle weakness  (generalized)  Difficulty in walking, not elsewhere classified  Abnormal posture  Rationale for Evaluation and Treatment Rehabilitation  PERTINENT HISTORY: History of 2 lumbar decompression surgeries (at least 20 years since).  Lt THA, bilateral TKA, Arthritis, COPD, depression, GERD, see above list   PRECAUTIONS: none  SUBJECTIVE:  SUBJECTIVE STATEMENT:   Pt arriving today reporting    PAIN:  Are you having pain? Yes: NPRS scale: 7/10 Pain location: thoracic spine Pain description: sharp Aggravating factors: standing, bending Relieving factors: changing positions      OBJECTIVE: (objective measures completed at initial evaluation unless otherwise dated)  PATIENT SURVEYS:  03/05/2022 FOTO eval: 43   predicted:  50   SCREENING FOR RED FLAGS: 03/05/2022 Bowel or bladder incontinence: No Cauda equina syndrome: No   COGNITION: 03/05/2022 Overall cognitive status: WFL normal                                      SENSATION: 03/05/2022 No specific testing today   MUSCLE LENGTH: 03/05/2022 No specific testing today   POSTURE: 03/05/2022 Mild forward trunk lean, forward head posture.  Reduced lumbar lordosis in standing   PALPATION: 03/05/2022 Light touch tenderness throughout lower thoracic and lumbar region grossly   LUMBAR ROM:    AROM 03/05/2022  Flexion Movement to mid shin c relief noted in end range.  Aberrant movement with gower's sign upon return c pain in movement to neutral  Extension To neutral.  Pt was reluctant to perform lumbar extension due to pain  Right lateral flexion Movement to mid thigh c back pain  Left lateral flexion Movement to mid thigh c back pain  Right rotation    Left rotation     (Blank rows = not tested)   LOWER EXTREMITY ROM:         Right 03/05/2022 Left 03/05/2022  Hip flexion      Hip extension      Hip abduction      Hip adduction      Hip internal rotation      Hip external rotation      Knee flexion      Knee extension      Ankle dorsiflexion      Ankle plantarflexion      Ankle inversion      Ankle eversion       (Blank rows = not tested)   LOWER EXTREMITY MMT:     MMT Right 03/05/2022 Left 03/05/2022  Hip flexion 5/5 5/5  Hip extension      Hip abduction      Hip adduction      Hip internal rotation      Hip external rotation      Knee flexion 5/5 5/5  Knee extension 5/5 5/5  Ankle dorsiflexion 5/5 5/5  Ankle plantarflexion             Lumbar extensors    Weakness noted in return to standing in flexion mobility   (Blank rows = not tested)   LUMBAR SPECIAL TESTS:  03/05/2022 (+) slump bilateral for posterior leg pulling but not concordant symptoms   FUNCTIONAL TESTS:  03/05/2022 Unable to stand from 18 inch chair s UE assist.    GAIT: 03/05/2022 Independent ambulation   TODAY'S TREATMENT:  DATE:  03/18/22:  TherEx:  Seated abdominal isometric on physioball x 10 holding 5 sec Seated multifidi isometric on phyisoball x 10 holding 5 sec Seated scapular retraction x 10 holding 5 sec Supine trunk rotation x 4 each side holding 30 sec SLR: x 10 each LE Supine clam shells: Level 2 band x 15 holding 3 sec Manual: Gentle Grade 2-3 PA thoracic and lumbar mobs: L1-L5  (difficulty c tolerance) Gentle STM to thoracic paraspinals Modalities; Moist heat x 5 minutes to  thoracic and lumbar spine  03/05/2022  Therex:    HEP instruction/performance c cues for techniques, handout provided.  Trial set performed of each for comprehension and symptom assessment.  See below for exercise list   PATIENT EDUCATION:  03/05/2022 Education details: HEP, POC Person educated:  Patient Education method: Explanation, Demonstration, Verbal cues, and Handouts Education comprehension: verbalized understanding, returned demonstration, and verbal cues required   HOME EXERCISE PROGRAM: Access Code: [email or web address] URL: [email or web address] Date: 03/05/2022 Prepared by: Scot Jun   Exercises - Supine Lower Trunk Rotation  - 2-3 x daily - 7 x weekly - 1 sets - 3-5 reps - 15 hold - Hooklying Gluteal Sets  - 2-3 x daily - 7 x weekly - 1 sets - 10 reps - 5 hold - Seated Multifidi Isometric  - 2-3 x daily - 7 x weekly - 1 sets - 10 reps - 5-10 hold - Seated Abdominal Press into [employer]  - 2-3 x daily - 7 x weekly - 1 sets - 10 reps - 5-10 hold - Seated Scapular Retraction  - 2-3 x daily - 7 x weekly - 1 sets - 10 reps - 3-5 hold   ASSESSMENT:   CLINICAL IMPRESSION: Pt reporting 7/10 pain in her thoracic and lumbar spine. Pt reporting sharp/stabbing pain when she bends lifting her husband at home. Pt's HEP was reviewed and pt able to return demonstration. Pt had difficulty tolerating mobilizations due to tenderness. Pt with good response to STM followed by moist heat at end of session. Continue skilled PT to maximize pt's function.     OBJECTIVE IMPAIRMENTS: Abnormal gait, decreased activity tolerance, decreased balance, decreased coordination, decreased endurance, decreased mobility, difficulty walking, decreased ROM, decreased strength, hypomobility, increased fascial restrictions, impaired perceived functional ability, increased muscle spasms, impaired flexibility, improper body mechanics, postural dysfunction, and pain.    ACTIVITY LIMITATIONS: carrying, lifting, bending, sitting, standing, squatting, sleeping, stairs, transfers, bed mobility, bathing, toileting, dressing, reach over head, locomotion level, and caring for others   PARTICIPATION LIMITATIONS: meal prep, cleaning, laundry, interpersonal relationship, shopping, community activity, and yard  work   PERSONAL FACTORS:  Arthritis, COPD, depression, GERD, length of time since onset, severity of condition, long history of surgeries for back and BLE  are also affecting patient's functional outcome.    REHAB POTENTIAL: Fair to good   CLINICAL DECISION MAKING: Evolving/moderate complexity   EVALUATION COMPLEXITY: Moderate     GOALS: Goals reviewed with patient? Yes   SHORT TERM GOALS: (target date for Short term goals are 3 weeks 03/26/2022)   1. Patient will demonstrate independent use of home exercise program to maintain progress from in clinic treatments.   Goal status: ongoing 03/18/22   LONG TERM GOALS: (target dates for all long term goals are 10 weeks  05/14/2022 )   1. Patient will demonstrate/report pain at worst less than or equal to 2/10 to facilitate minimal limitation in daily activity secondary to pain symptoms.   Goal status: New   2.  Patient will demonstrate independent use of home exercise program to facilitate ability to maintain/progress functional gains from skilled physical therapy services.   Goal status: New   3. Patient will demonstrate FOTO outcome > or = 50 % to indicate reduced disability due to condition.   Goal status: New   4. Patient will demonstrate lumbar extension 100 % WFL s symptoms to facilitate upright standing, walking posture at PLOF s limitation.   Goal status: New   5.  Patient will demonstrate sit to stand to sit from 18 inch chair s UE assist to show improved strength and mobility in daily life, reduced fall risk.   Goal status: New   6.  Patient will demonstrate/report ability to perform caregiver activity s increased symptoms.  Goal status: New     PLAN:   PT FREQUENCY: 1-2x/week   PT DURATION: 10 weeks   PLANNED INTERVENTIONS: Therapeutic exercises, Therapeutic activity, Neuro Muscular re-education, Balance training, Gait training, Patient/Family education, Joint mobilization, Stair training, DME instructions, Dry  Needling, Electrical stimulation, Cryotherapy, vasopneumatic device, Moist heat, Taping, Traction Ultrasound, Ionotophoresis '4mg'$ /ml Dexamethasone, and Manual therapy.  All included unless contraindicated   PLAN FOR NEXT SESSION: Review HEP knowledge/results.   Easy progressive postural strengthening and lumbar/hip mobility gains.        Oretha Caprice, PT, MPT 03/18/2022, 3:29 PM

## 2022-03-25 ENCOUNTER — Encounter: Payer: Self-pay | Admitting: Physical Therapy

## 2022-03-25 ENCOUNTER — Ambulatory Visit (INDEPENDENT_AMBULATORY_CARE_PROVIDER_SITE_OTHER): Payer: Medicare Other | Admitting: Physical Therapy

## 2022-03-25 DIAGNOSIS — M5459 Other low back pain: Secondary | ICD-10-CM | POA: Diagnosis not present

## 2022-03-25 DIAGNOSIS — M6281 Muscle weakness (generalized): Secondary | ICD-10-CM

## 2022-03-25 DIAGNOSIS — R293 Abnormal posture: Secondary | ICD-10-CM

## 2022-03-25 DIAGNOSIS — R262 Difficulty in walking, not elsewhere classified: Secondary | ICD-10-CM | POA: Diagnosis not present

## 2022-03-25 NOTE — Therapy (Signed)
OUTPATIENT PHYSICAL THERAPY TREATMENT NOTE   Patient Name: Katrina [name] MRN: [id number] DOB:Jan 22, 1956, 66 y.o., female Today's Date: 03/25/2022  PCP: Leretha Pol NP  REFERRING PROVIDER: Callie Fielding, MD    END OF SESSION:   PT End of Session - 03/25/22 1051     Visit Number 3    Number of Visits 20    Date for PT Re-Evaluation 05/14/22    Authorization Type [employer] Medicare $10 copay    Progress Note Due on Visit 10    PT Start Time 1015    PT Stop Time 1100    PT Time Calculation (min) 45 min    Activity Tolerance Patient limited by pain    Behavior During Therapy [facility] for tasks assessed/performed              Past Medical History:  Diagnosis Date   Anxiety    Arthritis    knees, hips, elbows    Asthma    hx of asthma 3/12- hospitalized    COPD (chronic obstructive pulmonary disease) ([place])    Depression    GERD (gastroesophageal reflux disease)    PRIOR TO HAVING GALLBLADDER REMOVED   Headache    from her neck   Left bundle branch block 06/28/2010   Lung infection 2015   Lung infection    Past Surgical History:  Procedure Laterality Date   BACK SURGERY     x2   CARPAL TUNNEL RELEASE     left    CESAREAN SECTION     x 2   CHOLECYSTECTOMY     COLONOSCOPY  10/2017   DILATATION & CURETTAGE/HYSTEROSCOPY WITH MYOSURE N/A 12/10/2017   Procedure: DILATATION & CURETTAGE/HYSTEROSCOPY WITH MYOSURE;  Surgeon: Christophe Louis, MD;  Location: [facility];  Service: Gynecology;  Laterality: N/A;  POSSIBLE MYOMECTOMY VS POLYPECTOMY WITH MYOSURE   DILATION AND CURETTAGE OF UTERUS     ELBOW SURGERY     JOINT REPLACEMENT     KNEE ARTHROSCOPY     bilateral x 2    KNEE ARTHROSCOPY Left 11/03/2013   Procedure: LEFT KNEE ARTHROSCOPY WITH DEBRIDEMENT, PARTIAL SYNOVECTOMY;  Surgeon: Mcarthur Rossetti, MD;  Location: [facility];  Service: Orthopedics;  Laterality: Left;   LAPAROSCOPY     left elbow surgery      OTHER SURGICAL HISTORY     arthroswcopic surgery right knee     OTHER SURGICAL HISTORY     arthroscopic surgery left knee x 2    OTHER SURGICAL HISTORY     bunionectomy right foot    OTHER SURGICAL HISTORY     C Section x 2    OTHER SURGICAL HISTORY     carpal tunnel on left    POSTERIOR CERVICAL FUSION/FORAMINOTOMY N/A 04/02/2020   Procedure: RIGHT C5-6 FORAMINOTOMY;  Surgeon: Jessy Oto, MD;  Location: [place];  Service: Orthopedics;  Laterality: N/A;   RADIOLOGY WITH ANESTHESIA N/A 07/28/2019   Procedure: MRI WITH ANESTHESIA OF NECK SOFT TISSUE ONLY WITH AND WITHOUT CONTRAST;  Surgeon: Radiologist, Medication, MD;  Location: [place] [facility];  Service: Radiology;  Laterality: N/A;   RADIOLOGY WITH ANESTHESIA N/A 10/04/2019   Procedure: MRI WITH ANESTHESIA RIGHT SHOULDER WITHOUT CONTRAST,MRI OF CERVICAL SPINE WITHOUT CONTRAST;  Surgeon: Radiologist, Medication, MD;  Location: [facility];  Service: Radiology;  Laterality: N/A;   RADIOLOGY WITH ANESTHESIA N/A 08/30/2020   Procedure: MRI WITH ANESTHESIA CERVICAL SPINE WITHOUT CONTRAST;  Surgeon: Radiologist, Medication, MD;  Location: [place];  Service: Radiology;  Laterality:  N/A;   right foot bunionectomy      RIGHT/LEFT HEART CATH AND CORONARY ANGIOGRAPHY N/A 04/26/2021   Procedure: RIGHT/LEFT HEART CATH AND CORONARY ANGIOGRAPHY;  Surgeon: Early Osmond, MD;  Location: [facility];  Service: Cardiovascular;  Laterality: N/A;   ROBOTIC ASSISTED TOTAL HYSTERECTOMY WITH BILATERAL SALPINGO OOPHERECTOMY Bilateral 06/16/2018   Procedure: XI ROBOTIC ASSISTED TOTAL HYSTERECTOMY WITH RIGHT SALPINGO OOPHORECTOMY;  Surgeon: Christophe Louis, MD;  Location: [place];  Service: Gynecology;  Laterality: Bilateral;   TOTAL HIP ARTHROPLASTY  05/02/2011   Procedure: TOTAL HIP ARTHROPLASTY ANTERIOR APPROACH;  Surgeon: Mcarthur Rossetti, MD;  Location: [facility];  Service: Orthopedics;  Laterality: Left;  Left Total Hip Arthroplasty, Direct Anterior Approach   TOTAL KNEE ARTHROPLASTY Right 12/03/2012   Procedure:  RIGHT TOTAL KNEE ARTHROPLASTY;  Surgeon: Mcarthur Rossetti, MD;  Location: [facility];  Service: Orthopedics;  Laterality: Right;   TOTAL KNEE ARTHROPLASTY Left 12/15/2014   Procedure: LEFT TOTAL KNEE ARTHROPLASTY;  Surgeon: Mcarthur Rossetti, MD;  Location: [facility];  Service: Orthopedics;  Laterality: Left;   TUBAL LIGATION     ULNAR NERVE TRANSPOSITION     left    UPPER GI ENDOSCOPY     x 2   Patient Active Problem List   Diagnosis Date Noted   Chronic systolic heart failure ([place]) 05/21/2021   Leukocytosis 03/07/2021   Mixed hyperlipidemia 03/07/2021   Left bundle branch block 02/20/2021   Left axis deviation 02/20/2021   Acquired hallux varus of left foot 02/20/2021   Porokeratosis 12/31/2020   Sesamoiditis 12/31/2020   Hav (hallux abducto valgus), left 12/31/2020   Encounter to establish care 12/16/2020   Cigarette smoker motivated to quit 12/16/2020   Callus of foot 12/16/2020   Body mass index (BMI) of 34.0-34.9 in adult 12/16/2020   Other spondylosis with radiculopathy, cervical region 04/02/2020    Class: Chronic   Pain in finger of left hand 02/24/2020   Lung nodules 11/30/2019   Neck pain 08/04/2019   Acute pain of right shoulder 04/27/2019   Neck mass 04/27/2019   Postmenopausal bleeding 06/16/2018   S/P hysterectomy 06/16/2018   Chronic right shoulder pain 10/28/2016   Impingement syndrome of right shoulder 10/28/2016   Osteoarthritis of left knee 12/15/2014   Status post total left knee replacement 12/15/2014   Hand pain, left 04/21/2014   Cat bite of finger 04/21/2014   Asthma 04/21/2014   Tobacco abuse 04/21/2014   COPD (chronic obstructive pulmonary disease) ([place])    Effusion of left knee joint 11/03/2013   Arthritis of right knee 12/03/2012   Degenerative arthritis of hip 05/02/2011    REFERRING DIAG:  M54.50,G89.29 (ICD-10-CM) - Chronic midline low back pain without sciatica   THERAPY DIAG:  Other low back pain  Muscle weakness  (generalized)  Difficulty in walking, not elsewhere classified  Abnormal posture  Rationale for Evaluation and Treatment Rehabilitation  PERTINENT HISTORY: History of 2 lumbar decompression surgeries (at least 20 years since).  Lt THA, bilateral TKA, Arthritis, COPD, depression, GERD, see above list   PRECAUTIONS: none  SUBJECTIVE:  SUBJECTIVE STATEMENT:   Pt arriving today reporting    PAIN:  Are you having pain? Yes: NPRS scale: 7/10 Pain location: thoracic spine Pain description: sharp Aggravating factors: standing, bending Relieving factors: changing positions      OBJECTIVE: (objective measures completed at initial evaluation unless otherwise dated)  PATIENT SURVEYS:  03/05/2022 FOTO eval: 43   predicted:  50   SCREENING FOR RED FLAGS: 03/05/2022 Bowel or bladder incontinence: No Cauda equina syndrome: No   COGNITION: 03/05/2022 Overall cognitive status: WFL normal                                      SENSATION: 03/05/2022 No specific testing today   MUSCLE LENGTH: 03/05/2022 No specific testing today   POSTURE: 03/05/2022 Mild forward trunk lean, forward head posture.  Reduced lumbar lordosis in standing   PALPATION: 03/05/2022 Light touch tenderness throughout lower thoracic and lumbar region grossly   LUMBAR ROM:    AROM 03/05/2022  Flexion Movement to mid shin c relief noted in end range.  Aberrant movement with gower's sign upon return c pain in movement to neutral  Extension To neutral.  Pt was reluctant to perform lumbar extension due to pain  Right lateral flexion Movement to mid thigh c back pain  Left lateral flexion Movement to mid thigh c back pain  Right rotation    Left rotation     (Blank rows = not tested)   LOWER EXTREMITY ROM:         Right 03/05/2022 Left 03/05/2022  Hip flexion      Hip extension      Hip abduction      Hip adduction      Hip internal rotation      Hip external rotation      Knee flexion      Knee extension      Ankle dorsiflexion      Ankle plantarflexion      Ankle inversion      Ankle eversion       (Blank rows = not tested)   LOWER EXTREMITY MMT:     MMT Right 03/05/2022 Left 03/05/2022  Hip flexion 5/5 5/5  Hip extension      Hip abduction      Hip adduction      Hip internal rotation      Hip external rotation      Knee flexion 5/5 5/5  Knee extension 5/5 5/5  Ankle dorsiflexion 5/5 5/5  Ankle plantarflexion             Lumbar extensors    Weakness noted in return to standing in flexion mobility   (Blank rows = not tested)   LUMBAR SPECIAL TESTS:  03/05/2022 (+) slump bilateral for posterior leg pulling but not concordant symptoms   FUNCTIONAL TESTS:  03/05/2022 Unable to stand from 18 inch chair s UE assist.    GAIT: 03/05/2022 Independent ambulation   TODAY'S TREATMENT:  DATE:  03/25/22:  There Ex:  Nustep: Level 5 x 10 minutes LE/UE Prone press ups x 5 holding 10 sec Trunk rotation: x 3 holding 30 sec Abdominal sets x 10 holding 10 sec Glute sets x 10 holding 5 seconds Manual:  STM to lumbar paraspinals,  Grade 2 PA lumbar mobs (difficulty with tolerance) Percussion to bil lumbar paraspinals and gluteals Modalities:  E-stim: IFC x 15 minutes with  moist heat   03/18/22:  TherEx:  Seated abdominal isometric on physioball x 10 holding 5 sec Seated multifidi isometric on phyisoball x 10 holding 5 sec Seated scapular retraction x 10 holding 5 sec Supine trunk rotation x 4 each side holding 30 sec SLR: x 10 each LE Supine clam shells: Level 2 band x 15 holding 3 sec Manual: Gentle Grade 2-3 PA thoracic and lumbar mobs: L1-L5   (difficulty c tolerance) Gentle STM to thoracic paraspinals Modalities; Moist heat x 5 minutes to  thoracic and lumbar spine  03/05/2022  Therex:    HEP instruction/performance c cues for techniques, handout provided.  Trial set performed of each for comprehension and symptom assessment.  See below for exercise list   PATIENT EDUCATION:  03/05/2022 Education details: HEP, POC Person educated: Patient Education method: Explanation, Demonstration, Verbal cues, and Handouts Education comprehension: verbalized understanding, returned demonstration, and verbal cues required   HOME EXERCISE PROGRAM: Access Code: [email or web address] URL: [email or web address] Date: 03/05/2022 Prepared by: Scot Jun   Exercises - Supine Lower Trunk Rotation  - 2-3 x daily - 7 x weekly - 1 sets - 3-5 reps - 15 hold - Hooklying Gluteal Sets  - 2-3 x daily - 7 x weekly - 1 sets - 10 reps - 5 hold - Seated Multifidi Isometric  - 2-3 x daily - 7 x weekly - 1 sets - 10 reps - 5-10 hold - Seated Abdominal Press into [employer]  - 2-3 x daily - 7 x weekly - 1 sets - 10 reps - 5-10 hold - Seated Scapular Retraction  - 2-3 x daily - 7 x weekly - 1 sets - 10 reps - 3-5 hold   ASSESSMENT:   CLINICAL IMPRESSION: Pt arriving today with 6-7/10 pain in her low back with difficulty tolerating exercises.  Pt with good response to STM/ percussion followed by E-stim and moist heat at end of session. Continue skilled PT to maximize pt's function.     OBJECTIVE IMPAIRMENTS: Abnormal gait, decreased activity tolerance, decreased balance, decreased coordination, decreased endurance, decreased mobility, difficulty walking, decreased ROM, decreased strength, hypomobility, increased fascial restrictions, impaired perceived functional ability, increased muscle spasms, impaired flexibility, improper body mechanics, postural dysfunction, and pain.    ACTIVITY LIMITATIONS: carrying, lifting, bending, sitting, standing,  squatting, sleeping, stairs, transfers, bed mobility, bathing, toileting, dressing, reach over head, locomotion level, and caring for others   PARTICIPATION LIMITATIONS: meal prep, cleaning, laundry, interpersonal relationship, shopping, community activity, and yard work   PERSONAL FACTORS:  Arthritis, COPD, depression, GERD, length of time since onset, severity of condition, long history of surgeries for back and BLE  are also affecting patient's functional outcome.    REHAB POTENTIAL: Fair to good   CLINICAL DECISION MAKING: Evolving/moderate complexity   EVALUATION COMPLEXITY: Moderate     GOALS: Goals reviewed with patient? Yes   SHORT TERM GOALS: (target date for Short term goals are 3 weeks 03/26/2022)   1. Patient will demonstrate independent use of home exercise program to maintain progress from in clinic treatments.   Goal status:  ongoing 03/18/22   LONG TERM GOALS: (target dates for all long term goals are 10 weeks  05/14/2022 )   1. Patient will demonstrate/report pain at worst less than or equal to 2/10 to facilitate minimal limitation in daily activity secondary to pain symptoms.   Goal status: New   2. Patient will demonstrate independent use of home exercise program to facilitate ability to maintain/progress functional gains from skilled physical therapy services.   Goal status: New   3. Patient will demonstrate FOTO outcome > or = 50 % to indicate reduced disability due to condition.   Goal status: New   4. Patient will demonstrate lumbar extension 100 % WFL s symptoms to facilitate upright standing, walking posture at PLOF s limitation.   Goal status: New   5.  Patient will demonstrate sit to stand to sit from 18 inch chair s UE assist to show improved strength and mobility in daily life, reduced fall risk.   Goal status: New   6.  Patient will demonstrate/report ability to perform caregiver activity s increased symptoms.  Goal status: New     PLAN:    PT FREQUENCY: 1-2x/week   PT DURATION: 10 weeks   PLANNED INTERVENTIONS: Therapeutic exercises, Therapeutic activity, Neuro Muscular re-education, Balance training, Gait training, Patient/Family education, Joint mobilization, Stair training, DME instructions, Dry Needling, Electrical stimulation, Cryotherapy, vasopneumatic device, Moist heat, Taping, Traction Ultrasound, Ionotophoresis '4mg'$ /ml Dexamethasone, and Manual therapy.  All included unless contraindicated   PLAN FOR NEXT SESSION: Review HEP knowledge/results.   Easy progressive postural strengthening and lumbar/hip mobility gains.        Oretha Caprice, PT, MPT 03/25/2022, 10:52 AM

## 2022-03-27 ENCOUNTER — Encounter: Payer: Self-pay | Admitting: Rehabilitative and Restorative Service Providers"

## 2022-03-27 ENCOUNTER — Ambulatory Visit: Payer: Medicare Other | Admitting: Rehabilitative and Restorative Service Providers"

## 2022-03-27 DIAGNOSIS — R293 Abnormal posture: Secondary | ICD-10-CM | POA: Diagnosis not present

## 2022-03-27 DIAGNOSIS — M5459 Other low back pain: Secondary | ICD-10-CM | POA: Diagnosis not present

## 2022-03-27 DIAGNOSIS — M6281 Muscle weakness (generalized): Secondary | ICD-10-CM | POA: Diagnosis not present

## 2022-03-27 DIAGNOSIS — R262 Difficulty in walking, not elsewhere classified: Secondary | ICD-10-CM

## 2022-03-27 NOTE — Therapy (Signed)
OUTPATIENT PHYSICAL THERAPY TREATMENT NOTE   Patient Name: Katrina [name] MRN: [id number] DOB:1955/07/19, 66 y.o., female Today's Date: 03/27/2022  PCP: Leretha Pol NP  REFERRING PROVIDER: Callie Fielding, MD    END OF SESSION:   PT End of Session - 03/27/22 1004     Visit Number 4    Number of Visits 20    Date for PT Re-Evaluation 05/14/22    Authorization Type [employer] Medicare $10 copay    Progress Note Due on Visit 10    PT Start Time 1005    PT Stop Time 1050    PT Time Calculation (min) 45 min    Activity Tolerance Patient limited by pain    Behavior During Therapy [facility] for tasks assessed/performed               Past Medical History:  Diagnosis Date   Anxiety    Arthritis    knees, hips, elbows    Asthma    hx of asthma 3/12- hospitalized    COPD (chronic obstructive pulmonary disease) ([place])    Depression    GERD (gastroesophageal reflux disease)    PRIOR TO HAVING GALLBLADDER REMOVED   Headache    from her neck   Left bundle branch block 06/28/2010   Lung infection 2015   Lung infection    Past Surgical History:  Procedure Laterality Date   BACK SURGERY     x2   CARPAL TUNNEL RELEASE     left    CESAREAN SECTION     x 2   CHOLECYSTECTOMY     COLONOSCOPY  10/2017   DILATATION & CURETTAGE/HYSTEROSCOPY WITH MYOSURE N/A 12/10/2017   Procedure: DILATATION & CURETTAGE/HYSTEROSCOPY WITH MYOSURE;  Surgeon: Christophe Louis, MD;  Location: [place] [facility];  Service: Gynecology;  Laterality: N/A;  POSSIBLE MYOMECTOMY VS POLYPECTOMY WITH MYOSURE   DILATION AND CURETTAGE OF UTERUS     ELBOW SURGERY     JOINT REPLACEMENT     KNEE ARTHROSCOPY     bilateral x 2    KNEE ARTHROSCOPY Left 11/03/2013   Procedure: LEFT KNEE ARTHROSCOPY WITH DEBRIDEMENT, PARTIAL SYNOVECTOMY;  Surgeon: Mcarthur Rossetti, MD;  Location: [facility];  Service: Orthopedics;  Laterality: Left;   LAPAROSCOPY     left elbow surgery      OTHER SURGICAL HISTORY     arthroswcopic surgery right knee     OTHER SURGICAL HISTORY     arthroscopic surgery left knee x 2    OTHER SURGICAL HISTORY     bunionectomy right foot    OTHER SURGICAL HISTORY     C Section x 2    OTHER SURGICAL HISTORY     carpal tunnel on left    POSTERIOR CERVICAL FUSION/FORAMINOTOMY N/A 04/02/2020   Procedure: RIGHT C5-6 FORAMINOTOMY;  Surgeon: Jessy Oto, MD;  Location: [place];  Service: Orthopedics;  Laterality: N/A;   RADIOLOGY WITH ANESTHESIA N/A 07/28/2019   Procedure: MRI WITH ANESTHESIA OF NECK SOFT TISSUE ONLY WITH AND WITHOUT CONTRAST;  Surgeon: Radiologist, Medication, MD;  Location: [place];  Service: Radiology;  Laterality: N/A;   RADIOLOGY WITH ANESTHESIA N/A 10/04/2019   Procedure: MRI WITH ANESTHESIA RIGHT SHOULDER WITHOUT CONTRAST,MRI OF CERVICAL SPINE WITHOUT CONTRAST;  Surgeon: Radiologist, Medication, MD;  Location: [place];  Service: Radiology;  Laterality: N/A;   RADIOLOGY WITH ANESTHESIA N/A 08/30/2020   Procedure: MRI WITH ANESTHESIA CERVICAL SPINE WITHOUT CONTRAST;  Surgeon: Radiologist, Medication, MD;  Location: [place];  Service: Radiology;  Laterality: N/A;   right foot bunionectomy      RIGHT/LEFT HEART CATH AND CORONARY ANGIOGRAPHY N/A 04/26/2021   Procedure: RIGHT/LEFT HEART CATH AND CORONARY ANGIOGRAPHY;  Surgeon: Early Osmond, MD;  Location: [place] [facility];  Service: Cardiovascular;  Laterality: N/A;   ROBOTIC ASSISTED TOTAL HYSTERECTOMY WITH BILATERAL SALPINGO OOPHERECTOMY Bilateral 06/16/2018   Procedure: XI ROBOTIC ASSISTED TOTAL HYSTERECTOMY WITH RIGHT SALPINGO OOPHORECTOMY;  Surgeon: Christophe Louis, MD;  Location: [place];  Service: Gynecology;  Laterality: Bilateral;   TOTAL HIP ARTHROPLASTY  05/02/2011   Procedure: TOTAL HIP ARTHROPLASTY ANTERIOR APPROACH;  Surgeon: Mcarthur Rossetti, MD;  Location: [facility];  Service: Orthopedics;  Laterality: Left;  Left Total Hip Arthroplasty, Direct Anterior Approach   TOTAL KNEE ARTHROPLASTY Right 12/03/2012   Procedure:  RIGHT TOTAL KNEE ARTHROPLASTY;  Surgeon: Mcarthur Rossetti, MD;  Location: [facility];  Service: Orthopedics;  Laterality: Right;   TOTAL KNEE ARTHROPLASTY Left 12/15/2014   Procedure: LEFT TOTAL KNEE ARTHROPLASTY;  Surgeon: Mcarthur Rossetti, MD;  Location: [facility];  Service: Orthopedics;  Laterality: Left;   TUBAL LIGATION     ULNAR NERVE TRANSPOSITION     left    UPPER GI ENDOSCOPY     x 2   Patient Active Problem List   Diagnosis Date Noted   Chronic systolic heart failure ([place]) 05/21/2021   Leukocytosis 03/07/2021   Mixed hyperlipidemia 03/07/2021   Left bundle branch block 02/20/2021   Left axis deviation 02/20/2021   Acquired hallux varus of left foot 02/20/2021   Porokeratosis 12/31/2020   Sesamoiditis 12/31/2020   Hav (hallux abducto valgus), left 12/31/2020   Encounter to establish care 12/16/2020   Cigarette smoker motivated to quit 12/16/2020   Callus of foot 12/16/2020   Body mass index (BMI) of 34.0-34.9 in adult 12/16/2020   Other spondylosis with radiculopathy, cervical region 04/02/2020    Class: Chronic   Pain in finger of left hand 02/24/2020   Lung nodules 11/30/2019   Neck pain 08/04/2019   Acute pain of right shoulder 04/27/2019   Neck mass 04/27/2019   Postmenopausal bleeding 06/16/2018   S/P hysterectomy 06/16/2018   Chronic right shoulder pain 10/28/2016   Impingement syndrome of right shoulder 10/28/2016   Osteoarthritis of left knee 12/15/2014   Status post total left knee replacement 12/15/2014   Hand pain, left 04/21/2014   Cat bite of finger 04/21/2014   Asthma 04/21/2014   Tobacco abuse 04/21/2014   COPD (chronic obstructive pulmonary disease) ([place])    Effusion of left knee joint 11/03/2013   Arthritis of right knee 12/03/2012   Degenerative arthritis of hip 05/02/2011    REFERRING DIAG:  M54.50,G89.29 (ICD-10-CM) - Chronic midline low back pain without sciatica   THERAPY DIAG:  Other low back pain  Muscle weakness  (generalized)  Difficulty in walking, not elsewhere classified  Abnormal posture  Rationale for Evaluation and Treatment Rehabilitation  PERTINENT HISTORY: History of 2 lumbar decompression surgeries (at least 20 years since).  Lt THA, bilateral TKA, Arthritis, COPD, depression, GERD, see above list   PRECAUTIONS: none  SUBJECTIVE:  SUBJECTIVE STATEMENT:   Pt indicated she was able to sleep after last visit due to being more relax.  Pt indicated getting stronger with arms and can lie on Rt side.  Pt indicated prolonged standing.  Reported massage gun on back was ok in some areas and too tender in some areas.   Pt reported global rating of change +2 a little bit better.   PAIN:   NPRS scale: 7/10 Pain location: thoracic spine Pain description: sharp Aggravating factors: standing, bending Relieving factors: changing positions  OBJECTIVE: (objective measures completed at initial evaluation unless otherwise dated)  PATIENT SURVEYS:  03/05/2022 FOTO eval: 43   predicted:  50   SCREENING FOR RED FLAGS: 03/05/2022 Bowel or bladder incontinence: No Cauda equina syndrome: No   COGNITION: 03/05/2022 Overall cognitive status: WFL normal                                      SENSATION: 03/05/2022 No specific testing today   MUSCLE LENGTH: 03/05/2022 No specific testing today   POSTURE: 03/05/2022 Mild forward trunk lean, forward head posture.  Reduced lumbar lordosis in standing   PALPATION: 03/05/2022 Light touch tenderness throughout lower thoracic and lumbar region grossly   LUMBAR ROM:    AROM 03/05/2022 03/27/2022  Flexion Movement to mid shin c relief noted in end range.  Aberrant movement with gower's sign upon return c pain in movement to neutral Movement to mid shin c relief noted in end  range.  Aberrant movement with gower's sign upon return c pain in movement to neutral  Extension To neutral.  Pt was reluctant to perform lumbar extension due to pain 25% WFL with pain in back noted  Right lateral flexion Movement to mid thigh c back pain Movement to mid thigh c back pain  Left lateral flexion Movement to mid thigh c back pain Movement to mid thigh c back pain  Right rotation     Left rotation      (Blank rows = not tested)   LOWER EXTREMITY ROM:        Right 03/05/2022 Left 03/05/2022  Hip flexion      Hip extension      Hip abduction      Hip adduction      Hip internal rotation      Hip external rotation      Knee flexion      Knee extension      Ankle dorsiflexion      Ankle plantarflexion      Ankle inversion      Ankle eversion       (Blank rows = not tested)   LOWER EXTREMITY MMT:     MMT Right 03/05/2022 Left 03/05/2022  Hip flexion 5/5 5/5  Hip extension      Hip abduction      Hip adduction      Hip internal rotation      Hip external rotation      Knee flexion 5/5 5/5  Knee extension 5/5 5/5  Ankle dorsiflexion 5/5 5/5  Ankle plantarflexion             Lumbar extensors    Weakness noted in return to standing in flexion mobility   (Blank rows = not tested)   LUMBAR SPECIAL TESTS:  03/05/2022 (+) slump bilateral for posterior leg pulling but not concordant symptoms   FUNCTIONAL TESTS:  03/05/2022  Unable to stand from 18 inch chair s UE assist.    GAIT: 03/05/2022 Independent ambulation   TODAY'S TREATMENT:                                                                                             DATE: 03/27/2022:  There Ex:  Nustep: Level 6 x 12 minutes LE/UE Prone press ups on elbow 2 mins  Review cues for home program   Manual:  Percussion to bil lumbar paraspinals and gluteals  Modalities:  E-stim: IFC x 10 minutes  TODAY'S TREATMENT:                                                                                              DATE: 03/25/2022:  There Ex:  Nustep: Level 5 x 10 minutes LE/UE Prone press ups x 5 holding 10 sec Trunk rotation: x 3 holding 30 sec Abdominal sets x 10 holding 10 sec Glute sets x 10 holding 5 seconds Manual:  STM to lumbar paraspinals,  Grade 2 PA lumbar mobs (difficulty with tolerance) Percussion to bil lumbar paraspinals and gluteals Modalities:  E-stim: IFC x 15 minutes with  moist heat   03/18/22:  TherEx:  Seated abdominal isometric on physioball x 10 holding 5 sec Seated multifidi isometric on phyisoball x 10 holding 5 sec Seated scapular retraction x 10 holding 5 sec Supine trunk rotation x 4 each side holding 30 sec SLR: x 10 each LE Supine clam shells: Level 2 band x 15 holding 3 sec Manual: Gentle Grade 2-3 PA thoracic and lumbar mobs: L1-L5  (difficulty c tolerance) Gentle STM to thoracic paraspinals Modalities; Moist heat x 5 minutes to  thoracic and lumbar spine    PATIENT EDUCATION:  03/05/2022 Education details: HEP, POC Person educated: Patient Education method: Consulting civil engineer, Demonstration, Verbal cues, and Handouts Education comprehension: verbalized understanding, returned demonstration, and verbal cues required   HOME EXERCISE PROGRAM: Access Code: [email or web address] URL: [email or web address] Date: 03/05/2022 Prepared by: Scot Jun   Exercises - Supine Lower Trunk Rotation  - 2-3 x daily - 7 x weekly - 1 sets - 3-5 reps - 15 hold - Hooklying Gluteal Sets  - 2-3 x daily - 7 x weekly - 1 sets - 10 reps - 5 hold - Seated Multifidi Isometric  - 2-3 x daily - 7 x weekly - 1 sets - 10 reps - 5-10 hold - Seated Abdominal Press into [employer]  - 2-3 x daily - 7 x weekly - 1 sets - 10 reps - 5-10 hold - Seated Scapular Retraction  - 2-3 x daily - 7 x weekly - 1 sets - 10 reps - 3-5 hold   ASSESSMENT:   CLINICAL IMPRESSION: GROC +2 a little bit better reported over  first few visits in therapy.  Mild improvement in lumbar mobility  tolerance.  Continued presentation of back pain c mobility and strength deficits.      OBJECTIVE IMPAIRMENTS: Abnormal gait, decreased activity tolerance, decreased balance, decreased coordination, decreased endurance, decreased mobility, difficulty walking, decreased ROM, decreased strength, hypomobility, increased fascial restrictions, impaired perceived functional ability, increased muscle spasms, impaired flexibility, improper body mechanics, postural dysfunction, and pain.    ACTIVITY LIMITATIONS: carrying, lifting, bending, sitting, standing, squatting, sleeping, stairs, transfers, bed mobility, bathing, toileting, dressing, reach over head, locomotion level, and caring for others   PARTICIPATION LIMITATIONS: meal prep, cleaning, laundry, interpersonal relationship, shopping, community activity, and yard work   PERSONAL FACTORS:  Arthritis, COPD, depression, GERD, length of time since onset, severity of condition, long history of surgeries for back and BLE  are also affecting patient's functional outcome.    REHAB POTENTIAL: Fair to good   CLINICAL DECISION MAKING: Evolving/moderate complexity   EVALUATION COMPLEXITY: Moderate     GOALS: Goals reviewed with patient? Yes   SHORT TERM GOALS: (target date for Short term goals are 3 weeks 03/26/2022)   1. Patient will demonstrate independent use of home exercise program to maintain progress from in clinic treatments.   Goal status: Met   LONG TERM GOALS: (target dates for all long term goals are 10 weeks  05/14/2022 )   1. Patient will demonstrate/report pain at worst less than or equal to 2/10 to facilitate minimal limitation in daily activity secondary to pain symptoms.   Goal status: on going 03/27/2022   2. Patient will demonstrate independent use of home exercise program to facilitate ability to maintain/progress functional gains from skilled physical therapy services.   Goal status: on going 03/27/2022   3. Patient will  demonstrate FOTO outcome > or = 50 % to indicate reduced disability due to condition.   Goal status: on going 03/27/2022   4. Patient will demonstrate lumbar extension 100 % WFL s symptoms to facilitate upright standing, walking posture at PLOF s limitation.   Goal status: on going 03/27/2022   5.  Patient will demonstrate sit to stand to sit from 18 inch chair s UE assist to show improved strength and mobility in daily life, reduced fall risk.   Goal status: on going 03/27/2022   6.  Patient will demonstrate/report ability to perform caregiver activity s increased symptoms.  Goal status: on going 03/27/2022     PLAN:   PT FREQUENCY: 1-2x/week   PT DURATION: 10 weeks   PLANNED INTERVENTIONS: Therapeutic exercises, Therapeutic activity, Neuro Muscular re-education, Balance training, Gait training, Patient/Family education, Joint mobilization, Stair training, DME instructions, Dry Needling, Electrical stimulation, Cryotherapy, vasopneumatic device, Moist heat, Taping, Traction Ultrasound, Ionotophoresis 5m/ml Dexamethasone, and Manual therapy.  All included unless contraindicated   PLAN FOR NEXT SESSION: Review HEP knowledge/results.   Easy progressive postural strengthening and lumbar/hip mobility gains.       MScot Jun PT, DPT, OCS, ATC 03/27/22  10:47 AM

## 2022-04-03 ENCOUNTER — Encounter: Payer: Self-pay | Admitting: Physical Therapy

## 2022-04-03 ENCOUNTER — Ambulatory Visit (INDEPENDENT_AMBULATORY_CARE_PROVIDER_SITE_OTHER): Payer: Medicare Other

## 2022-04-03 ENCOUNTER — Ambulatory Visit (INDEPENDENT_AMBULATORY_CARE_PROVIDER_SITE_OTHER): Payer: Medicare Other | Admitting: Orthopedic Surgery

## 2022-04-03 ENCOUNTER — Ambulatory Visit (INDEPENDENT_AMBULATORY_CARE_PROVIDER_SITE_OTHER): Payer: Medicare Other | Admitting: Physical Therapy

## 2022-04-03 DIAGNOSIS — G8929 Other chronic pain: Secondary | ICD-10-CM

## 2022-04-03 DIAGNOSIS — R262 Difficulty in walking, not elsewhere classified: Secondary | ICD-10-CM | POA: Diagnosis not present

## 2022-04-03 DIAGNOSIS — R293 Abnormal posture: Secondary | ICD-10-CM | POA: Diagnosis not present

## 2022-04-03 DIAGNOSIS — M545 Low back pain, unspecified: Secondary | ICD-10-CM | POA: Diagnosis not present

## 2022-04-03 DIAGNOSIS — M5459 Other low back pain: Secondary | ICD-10-CM

## 2022-04-03 DIAGNOSIS — M6281 Muscle weakness (generalized): Secondary | ICD-10-CM

## 2022-04-03 NOTE — Progress Notes (Signed)
Orthopedic Spine Surgery Office Note  Assessment: Patient is a 66 y.o. female with chronic low back pain. No radicular leg pain. Has sagittal imbalance on today's scoli films with loss of lumbar lordosis. SVA of 9. Also, has disc height loss at multiple levels.    Plan: -Explained that initially conservative treatment is tried as a significant number of patients may experience relief with these treatment modalities. Discussed that the conservative treatments include:  -activity modification  -physical therapy  -over the counter pain medications  -core strengthening  -weight loss  -pain management -I explained that some of her symptoms may be from her sagittal balance. She is undergoing cardiac work up, is still smoking, and has an elevated BMI so I explained she would not be a good candidate for surgery at this time. She has tried multiple conservative treatments without relief, so referred her to pain management to help with some of the pain -Would eventually want supine lateral films if we consider correcting her sagittal imbalance -Continue with PT, weight loss, core strengthening -Will consider MRI at our next visit if she is still having significant pain -Patient should return to office in 12 weeks, x-rays at next visit: none   Patient expressed understanding of the plan and all questions were answered to the patient's satisfaction.   ___________________________________________________________________________  History: Patient is a 66 y.o. female who has been previously seen in the office for symptoms consistent with lumbar radiculopathy. Since the last visit, symptom intensity has remained the same. Still low back pain that is worse with activity and improves with rest. Felt mostly in the middle and paraspinal muscle in the low back. No leg pain. Denies paresthesias and numbness. Has not improved much with PT and home exercises so far. Does get good relief if she lays in bed on her  stomach.   Previous treatments: Activity modification, over-the-counter pain medications, PT, home exercises  COPY OF FIRST NOTE Patient is a 66 y.o. female who presents today for lumbar spine.  Patient has a history of 2 lumbar decompressions by another surgeon who comes in with exacerbation of her chronic low back pain.  States she has been having to do a lot of work around the house and help her husband who is in bed most of the day.  She had to do a lot of extra bending and twisting as a result of the increased work she has had to do.  Her pain is felt in her lower back.  Does not radiate into her legs.  It is worse with activity.  It does help if she lays down for couple hours.  She has some decrease sensation in the great toe on the right side.  No other numbness or paresthesias     Weakness: Denies Symptoms of imbalance: Yes, feels she is falling forward Paresthesias and numbness: Yes, and right great toe.  No other numbness or paresthesias Bowel or bladder incontinence: Denies Saddle anesthesia: Denies Clumsiness with hands: Denies END OF COPY  Physical Exam:  General: no acute distress, appears stated age Neurologic: alert, answering questions appropriately, following commands Respiratory: unlabored breathing on room air, symmetric chest rise Psychiatric: appropriate affect, normal cadence to speech   MSK (spine):  -Strength exam      Left  Right EHL    -/5  -/5 TA    5/5  5/5 GSC    5/5  5/5 Knee extension  5/5  5/5 Hip flexion   5/5  5/5  Has had  bunion surgery on bilateral feet still unable to test EHL   -Sensory exam    Sensation intact to light touch in L3-S1 nerve distributions of bilateral lower extremities  -Achilles DTR: 2/4 on the left, 2/4 on the right -Patellar tendon DTR: 2/4 on the left, 2/4 on the right  -Straight leg raise: negative -Contralateral straight leg raise: negative -Clonus: no beats bilaterally  Imaging: XR of the lumbar spine from  02/20/2022 was previously independently reviewed and interpreted, showing disc height loss at L1/2, L4/5, and L5/S1. Loss of lumbar lordosis. PI of 30, LL of 12   XR scoli from 04/03/2022 was independently reviewed and interpreted, showing PI of 35, LL of 15. SVA of 9cm.    Patient name: Katrina [name] Patient MRN: [id number] Date of visit: 04/03/22

## 2022-04-03 NOTE — Therapy (Signed)
OUTPATIENT PHYSICAL THERAPY TREATMENT NOTE   Patient Name: Katrina [name] MRN: [id number] DOB:February 24, 1956, 66 y.o., female Today's Date: 04/03/2022  PCP: Leretha Pol NP  REFERRING PROVIDER: Callie Fielding, MD    END OF SESSION:   PT End of Session - 04/03/22 1027     Visit Number 5    Number of Visits 20    Date for PT Re-Evaluation 05/14/22    Authorization Type [employer] Medicare $10 copay    Progress Note Due on Visit 10    PT Start Time 1015    PT Stop Time 1059    PT Time Calculation (min) 44 min    Activity Tolerance Patient limited by pain    Behavior During Therapy [facility] for tasks assessed/performed               Past Medical History:  Diagnosis Date   Anxiety    Arthritis    knees, hips, elbows    Asthma    hx of asthma 3/12- hospitalized    COPD (chronic obstructive pulmonary disease) ([place])    Depression    GERD (gastroesophageal reflux disease)    PRIOR TO HAVING GALLBLADDER REMOVED   Headache    from her neck   Left bundle branch block 06/28/2010   Lung infection 2015   Lung infection    Past Surgical History:  Procedure Laterality Date   BACK SURGERY     x2   CARPAL TUNNEL RELEASE     left    CESAREAN SECTION     x 2   CHOLECYSTECTOMY     COLONOSCOPY  10/2017   DILATATION & CURETTAGE/HYSTEROSCOPY WITH MYOSURE N/A 12/10/2017   Procedure: DILATATION & CURETTAGE/HYSTEROSCOPY WITH MYOSURE;  Surgeon: Christophe Louis, MD;  Location: [facility];  Service: Gynecology;  Laterality: N/A;  POSSIBLE MYOMECTOMY VS POLYPECTOMY WITH MYOSURE   DILATION AND CURETTAGE OF UTERUS     ELBOW SURGERY     JOINT REPLACEMENT     KNEE ARTHROSCOPY     bilateral x 2    KNEE ARTHROSCOPY Left 11/03/2013   Procedure: LEFT KNEE ARTHROSCOPY WITH DEBRIDEMENT, PARTIAL SYNOVECTOMY;  Surgeon: Mcarthur Rossetti, MD;  Location: [facility];  Service: Orthopedics;  Laterality: Left;   LAPAROSCOPY     left elbow surgery      OTHER SURGICAL HISTORY     arthroswcopic surgery right knee     OTHER SURGICAL HISTORY     arthroscopic surgery left knee x 2    OTHER SURGICAL HISTORY     bunionectomy right foot    OTHER SURGICAL HISTORY     C Section x 2    OTHER SURGICAL HISTORY     carpal tunnel on left    POSTERIOR CERVICAL FUSION/FORAMINOTOMY N/A 04/02/2020   Procedure: RIGHT C5-6 FORAMINOTOMY;  Surgeon: Jessy Oto, MD;  Location: [place];  Service: Orthopedics;  Laterality: N/A;   RADIOLOGY WITH ANESTHESIA N/A 07/28/2019   Procedure: MRI WITH ANESTHESIA OF NECK SOFT TISSUE ONLY WITH AND WITHOUT CONTRAST;  Surgeon: Radiologist, Medication, MD;  Location: [place];  Service: Radiology;  Laterality: N/A;   RADIOLOGY WITH ANESTHESIA N/A 10/04/2019   Procedure: MRI WITH ANESTHESIA RIGHT SHOULDER WITHOUT CONTRAST,MRI OF CERVICAL SPINE WITHOUT CONTRAST;  Surgeon: Radiologist, Medication, MD;  Location: [place];  Service: Radiology;  Laterality: N/A;   RADIOLOGY WITH ANESTHESIA N/A 08/30/2020   Procedure: MRI WITH ANESTHESIA CERVICAL SPINE WITHOUT CONTRAST;  Surgeon: Radiologist, Medication, MD;  Location: [place];  Service: Radiology;  Laterality: N/A;   right foot bunionectomy      RIGHT/LEFT HEART CATH AND CORONARY ANGIOGRAPHY N/A 04/26/2021   Procedure: RIGHT/LEFT HEART CATH AND CORONARY ANGIOGRAPHY;  Surgeon: Early Osmond, MD;  Location: [place] [facility];  Service: Cardiovascular;  Laterality: N/A;   ROBOTIC ASSISTED TOTAL HYSTERECTOMY WITH BILATERAL SALPINGO OOPHERECTOMY Bilateral 06/16/2018   Procedure: XI ROBOTIC ASSISTED TOTAL HYSTERECTOMY WITH RIGHT SALPINGO OOPHORECTOMY;  Surgeon: Christophe Louis, MD;  Location: [place];  Service: Gynecology;  Laterality: Bilateral;   TOTAL HIP ARTHROPLASTY  05/02/2011   Procedure: TOTAL HIP ARTHROPLASTY ANTERIOR APPROACH;  Surgeon: Mcarthur Rossetti, MD;  Location: [facility];  Service: Orthopedics;  Laterality: Left;  Left Total Hip Arthroplasty, Direct Anterior Approach   TOTAL KNEE ARTHROPLASTY Right 12/03/2012   Procedure:  RIGHT TOTAL KNEE ARTHROPLASTY;  Surgeon: Mcarthur Rossetti, MD;  Location: [facility];  Service: Orthopedics;  Laterality: Right;   TOTAL KNEE ARTHROPLASTY Left 12/15/2014   Procedure: LEFT TOTAL KNEE ARTHROPLASTY;  Surgeon: Mcarthur Rossetti, MD;  Location: [facility];  Service: Orthopedics;  Laterality: Left;   TUBAL LIGATION     ULNAR NERVE TRANSPOSITION     left    UPPER GI ENDOSCOPY     x 2   Patient Active Problem List   Diagnosis Date Noted   Chronic systolic heart failure ([place]) 05/21/2021   Leukocytosis 03/07/2021   Mixed hyperlipidemia 03/07/2021   Left bundle branch block 02/20/2021   Left axis deviation 02/20/2021   Acquired hallux varus of left foot 02/20/2021   Porokeratosis 12/31/2020   Sesamoiditis 12/31/2020   Hav (hallux abducto valgus), left 12/31/2020   Encounter to establish care 12/16/2020   Cigarette smoker motivated to quit 12/16/2020   Callus of foot 12/16/2020   Body mass index (BMI) of 34.0-34.9 in adult 12/16/2020   Other spondylosis with radiculopathy, cervical region 04/02/2020    Class: Chronic   Pain in finger of left hand 02/24/2020   Lung nodules 11/30/2019   Neck pain 08/04/2019   Acute pain of right shoulder 04/27/2019   Neck mass 04/27/2019   Postmenopausal bleeding 06/16/2018   S/P hysterectomy 06/16/2018   Chronic right shoulder pain 10/28/2016   Impingement syndrome of right shoulder 10/28/2016   Osteoarthritis of left knee 12/15/2014   Status post total left knee replacement 12/15/2014   Hand pain, left 04/21/2014   Cat bite of finger 04/21/2014   Asthma 04/21/2014   Tobacco abuse 04/21/2014   COPD (chronic obstructive pulmonary disease) ([place])    Effusion of left knee joint 11/03/2013   Arthritis of right knee 12/03/2012   Degenerative arthritis of hip 05/02/2011    REFERRING DIAG:  M54.50,G89.29 (ICD-10-CM) - Chronic midline low back pain without sciatica   THERAPY DIAG:  Other low back pain  Muscle weakness  (generalized)  Difficulty in walking, not elsewhere classified  Abnormal posture  Rationale for Evaluation and Treatment Rehabilitation  PERTINENT HISTORY: History of 2 lumbar decompression surgeries (at least 20 years since).  Lt THA, bilateral TKA, Arthritis, COPD, depression, GERD, see above list   PRECAUTIONS: none  SUBJECTIVE:  SUBJECTIVE STATEMENT:   Pt indicated MD wants to try to avoid operation and continue with PT. She says she cooked on Monday and has been in more pain since and feels like she has been punched in the back  PAIN:   NPRS scale: 9/10 Pain location: thoracic spine Pain description: sharp Aggravating factors: standing, bending Relieving factors: changing positions  OBJECTIVE: (objective measures completed at initial evaluation unless otherwise dated)  PATIENT SURVEYS:  03/05/2022 FOTO eval: 43   predicted:  50   SCREENING FOR RED FLAGS: 03/05/2022 Bowel or bladder incontinence: No Cauda equina syndrome: No   COGNITION: 03/05/2022 Overall cognitive status: WFL normal                                      SENSATION: 03/05/2022 No specific testing today   MUSCLE LENGTH: 03/05/2022 No specific testing today   POSTURE: 03/05/2022 Mild forward trunk lean, forward head posture.  Reduced lumbar lordosis in standing   PALPATION: 03/05/2022 Light touch tenderness throughout lower thoracic and lumbar region grossly   LUMBAR ROM:    AROM 03/05/2022 03/27/2022  Flexion Movement to mid shin c relief noted in end range.  Aberrant movement with gower's sign upon return c pain in movement to neutral Movement to mid shin c relief noted in end range.  Aberrant movement with gower's sign upon return c pain in movement to neutral  Extension To neutral.  Pt was reluctant to perform  lumbar extension due to pain 25% WFL with pain in back noted  Right lateral flexion Movement to mid thigh c back pain Movement to mid thigh c back pain  Left lateral flexion Movement to mid thigh c back pain Movement to mid thigh c back pain  Right rotation     Left rotation      (Blank rows = not tested)   LOWER EXTREMITY ROM:        Right 03/05/2022 Left 03/05/2022  Hip flexion      Hip extension      Hip abduction      Hip adduction      Hip internal rotation      Hip external rotation      Knee flexion      Knee extension      Ankle dorsiflexion      Ankle plantarflexion      Ankle inversion      Ankle eversion       (Blank rows = not tested)   LOWER EXTREMITY MMT:     MMT Right 03/05/2022 Left 03/05/2022  Hip flexion 5/5 5/5  Hip extension      Hip abduction      Hip adduction      Hip internal rotation      Hip external rotation      Knee flexion 5/5 5/5  Knee extension 5/5 5/5  Ankle dorsiflexion 5/5 5/5  Ankle plantarflexion             Lumbar extensors    Weakness noted in return to standing in flexion mobility   (Blank rows = not tested)   LUMBAR SPECIAL TESTS:  03/05/2022 (+) slump bilateral for posterior leg pulling but not concordant symptoms   FUNCTIONAL TESTS:  03/05/2022 Unable to stand from 18 inch chair s UE assist.    GAIT: 03/05/2022 Independent ambulation   Independent ambulation   TODAY'S TREATMENT:  DATE: 04/03/2022:  There Ex:  Nustep: Level 6 x 12 minutes LE/UE Prone press ups on elbows 10 seconds X 5 Low trunk rotations 10 sec X 5 Ab set isometric hip flexion in hooklying 5 sec X 10 Clams red X 15 holding 3 sec Standing hip extensions X 10 bilat  Manual:  Deferred Percussion to bil lumbar paraspinals and gluteals  Modalities:  E-stim: IFC x 10 minutes to low back with moist heat  TODAY'S TREATMENT:                                                                                              DATE: 03/27/2022:  There Ex:  Nustep: Level 6 x 12 minutes LE/UE Prone press ups on elbow 2 mins  Review cues for home program     Manual:  Percussion to bil lumbar paraspinals and gluteals   Modalities:  E-stim: IFC x 10 minutes  TODAY'S TREATMENT:                                                                                             DATE: 03/25/2022:  There Ex:  Nustep: Level 5 x 10 minutes LE/UE Prone press ups onto elbows x 5 holding 10 sec Trunk rotation: x 5 holding 10 sec Abdominal sets x 10 holding 10 sec Clams red X 15 Manual:  STM to lumbar paraspinals,  Grade 2 PA lumbar mobs (difficulty with tolerance) Percussion to bil lumbar paraspinals and gluteals Modalities:  E-stim: IFC x 15 minutes with  moist heat    PATIENT EDUCATION:  03/05/2022 Education details: HEP, POC Person educated: Patient Education method: Consulting civil engineer, Media planner, Verbal cues, and Handouts Education comprehension: verbalized understanding, returned demonstration, and verbal cues required   HOME EXERCISE PROGRAM: Access Code: [email or web address] URL: [email or web address] Date: 03/05/2022 Prepared by: Scot Jun   Exercises - Supine Lower Trunk Rotation  - 2-3 x daily - 7 x weekly - 1 sets - 3-5 reps - 15 hold - Hooklying Gluteal Sets  - 2-3 x daily - 7 x weekly - 1 sets - 10 reps - 5 hold - Seated Multifidi Isometric  - 2-3 x daily - 7 x weekly - 1 sets - 10 reps - 5-10 hold - Seated Abdominal Press into [employer]  - 2-3 x daily - 7 x weekly - 1 sets - 10 reps - 5-10 hold - Seated Scapular Retraction  - 2-3 x daily - 7 x weekly - 1 sets - 10 reps - 3-5 hold   ASSESSMENT:   CLINICAL IMPRESSION: Limited by pain but was able to perform her exercises today with fair overall tolerance. PT recommending gradual strength progressions as tolerated.     OBJECTIVE IMPAIRMENTS: Abnormal gait, decreased activity  tolerance, decreased balance, decreased  coordination, decreased endurance, decreased mobility, difficulty walking, decreased ROM, decreased strength, hypomobility, increased fascial restrictions, impaired perceived functional ability, increased muscle spasms, impaired flexibility, improper body mechanics, postural dysfunction, and pain.    ACTIVITY LIMITATIONS: carrying, lifting, bending, sitting, standing, squatting, sleeping, stairs, transfers, bed mobility, bathing, toileting, dressing, reach over head, locomotion level, and caring for others   PARTICIPATION LIMITATIONS: meal prep, cleaning, laundry, interpersonal relationship, shopping, community activity, and yard work   PERSONAL FACTORS:  Arthritis, COPD, depression, GERD, length of time since onset, severity of condition, long history of surgeries for back and BLE  are also affecting patient's functional outcome.    REHAB POTENTIAL: Fair to good   CLINICAL DECISION MAKING: Evolving/moderate complexity   EVALUATION COMPLEXITY: Moderate     GOALS: Goals reviewed with patient? Yes   SHORT TERM GOALS: (target date for Short term goals are 3 weeks 03/26/2022)   1. Patient will demonstrate independent use of home exercise program to maintain progress from in clinic treatments.   Goal status: Met   LONG TERM GOALS: (target dates for all long term goals are 10 weeks  05/14/2022 )   1. Patient will demonstrate/report pain at worst less than or equal to 2/10 to facilitate minimal limitation in daily activity secondary to pain symptoms.   Goal status: on going 03/27/2022   2. Patient will demonstrate independent use of home exercise program to facilitate ability to maintain/progress functional gains from skilled physical therapy services.   Goal status: on going 03/27/2022   3. Patient will demonstrate FOTO outcome > or = 50 % to indicate reduced disability due to condition.   Goal status: on going 03/27/2022   4. Patient will  demonstrate lumbar extension 100 % WFL s symptoms to facilitate upright standing, walking posture at PLOF s limitation.   Goal status: on going 03/27/2022   5.  Patient will demonstrate sit to stand to sit from 18 inch chair s UE assist to show improved strength and mobility in daily life, reduced fall risk.   Goal status: on going 03/27/2022   6.  Patient will demonstrate/report ability to perform caregiver activity s increased symptoms.  Goal status: on going 03/27/2022     PLAN:   PT FREQUENCY: 1-2x/week   PT DURATION: 10 weeks   PLANNED INTERVENTIONS: Therapeutic exercises, Therapeutic activity, Neuro Muscular re-education, Balance training, Gait training, Patient/Family education, Joint mobilization, Stair training, DME instructions, Dry Needling, Electrical stimulation, Cryotherapy, vasopneumatic device, Moist heat, Taping, Traction Ultrasound, Ionotophoresis 50m/ml Dexamethasone, and Manual therapy.  All included unless contraindicated   PLAN FOR NEXT SESSION:  Easy progressive postural strengthening and lumbar/hip mobility gains.      BElsie Ra PT, DPT 04/03/22 10:59 AM

## 2022-04-10 ENCOUNTER — Encounter: Payer: Self-pay | Admitting: Physical Medicine & Rehabilitation

## 2022-04-10 ENCOUNTER — Encounter: Payer: Self-pay | Admitting: Physical Therapy

## 2022-04-10 ENCOUNTER — Ambulatory Visit (INDEPENDENT_AMBULATORY_CARE_PROVIDER_SITE_OTHER): Payer: Medicare Other | Admitting: Physical Therapy

## 2022-04-10 DIAGNOSIS — M6281 Muscle weakness (generalized): Secondary | ICD-10-CM

## 2022-04-10 DIAGNOSIS — M5459 Other low back pain: Secondary | ICD-10-CM

## 2022-04-10 DIAGNOSIS — R293 Abnormal posture: Secondary | ICD-10-CM

## 2022-04-10 DIAGNOSIS — R262 Difficulty in walking, not elsewhere classified: Secondary | ICD-10-CM | POA: Diagnosis not present

## 2022-04-10 NOTE — Therapy (Signed)
OUTPATIENT PHYSICAL THERAPY TREATMENT NOTE   Patient Name: Katrina [name] MRN: [id number] DOB:[birth year], 67 y.o., female Today's Date: 04/10/2022  PCP: Leretha Pol NP  REFERRING PROVIDER: Callie Fielding, MD    END OF SESSION:   PT End of Session - 04/10/22 1018     Visit Number 6    Number of Visits 20    Date for PT Re-Evaluation 05/14/22    Authorization Type [employer] Medicare $10 copay    Progress Note Due on Visit 10    PT Start Time 1014    PT Stop Time 1100    PT Time Calculation (min) 46 min    Activity Tolerance Patient limited by pain    Behavior During Therapy [facility] for tasks assessed/performed               Past Medical History:  Diagnosis Date   Anxiety    Arthritis    knees, hips, elbows    Asthma    hx of asthma 3/12- hospitalized    COPD (chronic obstructive pulmonary disease) ([place])    Depression    GERD (gastroesophageal reflux disease)    PRIOR TO HAVING GALLBLADDER REMOVED   Headache    from her neck   Left bundle branch block 06/28/2010   Lung infection 2015   Lung infection    Past Surgical History:  Procedure Laterality Date   BACK SURGERY     x2   CARPAL TUNNEL RELEASE     left    CESAREAN SECTION     x 2   CHOLECYSTECTOMY     COLONOSCOPY  10/2017   DILATATION & CURETTAGE/HYSTEROSCOPY WITH MYOSURE N/A 12/10/2017   Procedure: DILATATION & CURETTAGE/HYSTEROSCOPY WITH MYOSURE;  Surgeon: Christophe Louis, MD;  Location: [facility];  Service: Gynecology;  Laterality: N/A;  POSSIBLE MYOMECTOMY VS POLYPECTOMY WITH MYOSURE   DILATION AND CURETTAGE OF UTERUS     ELBOW SURGERY     JOINT REPLACEMENT     KNEE ARTHROSCOPY     bilateral x 2    KNEE ARTHROSCOPY Left 11/03/2013   Procedure: LEFT KNEE ARTHROSCOPY WITH DEBRIDEMENT, PARTIAL SYNOVECTOMY;  Surgeon: Mcarthur Rossetti, MD;  Location: [facility];  Service: Orthopedics;  Laterality: Left;   LAPAROSCOPY     left elbow surgery      OTHER SURGICAL HISTORY     arthroswcopic surgery right knee     OTHER SURGICAL HISTORY     arthroscopic surgery left knee x 2    OTHER SURGICAL HISTORY     bunionectomy right foot    OTHER SURGICAL HISTORY     C Section x 2    OTHER SURGICAL HISTORY     carpal tunnel on left    POSTERIOR CERVICAL FUSION/FORAMINOTOMY N/A 04/02/2020   Procedure: RIGHT C5-6 FORAMINOTOMY;  Surgeon: Jessy Oto, MD;  Location: [place];  Service: Orthopedics;  Laterality: N/A;   RADIOLOGY WITH ANESTHESIA N/A 07/28/2019   Procedure: MRI WITH ANESTHESIA OF NECK SOFT TISSUE ONLY WITH AND WITHOUT CONTRAST;  Surgeon: Radiologist, Medication, MD;  Location: [place];  Service: Radiology;  Laterality: N/A;   RADIOLOGY WITH ANESTHESIA N/A 10/04/2019   Procedure: MRI WITH ANESTHESIA RIGHT SHOULDER WITHOUT CONTRAST,MRI OF CERVICAL SPINE WITHOUT CONTRAST;  Surgeon: Radiologist, Medication, MD;  Location: [place];  Service: Radiology;  Laterality: N/A;   RADIOLOGY WITH ANESTHESIA N/A 08/30/2020   Procedure: MRI WITH ANESTHESIA CERVICAL SPINE WITHOUT CONTRAST;  Surgeon: Radiologist, Medication, MD;  Location: [place];  Service: Radiology;  Laterality: N/A;   right foot bunionectomy      RIGHT/LEFT HEART CATH AND CORONARY ANGIOGRAPHY N/A 04/26/2021   Procedure: RIGHT/LEFT HEART CATH AND CORONARY ANGIOGRAPHY;  Surgeon: Early Osmond, MD;  Location: [place];  Service: Cardiovascular;  Laterality: N/A;   ROBOTIC ASSISTED TOTAL HYSTERECTOMY WITH BILATERAL SALPINGO OOPHERECTOMY Bilateral 06/16/2018   Procedure: XI ROBOTIC ASSISTED TOTAL HYSTERECTOMY WITH RIGHT SALPINGO OOPHORECTOMY;  Surgeon: Christophe Louis, MD;  Location: [place];  Service: Gynecology;  Laterality: Bilateral;   TOTAL HIP ARTHROPLASTY  05/02/2011   Procedure: TOTAL HIP ARTHROPLASTY ANTERIOR APPROACH;  Surgeon: Mcarthur Rossetti, MD;  Location: [facility];  Service: Orthopedics;  Laterality: Left;  Left Total Hip Arthroplasty, Direct Anterior Approach   TOTAL KNEE ARTHROPLASTY Right 12/03/2012   Procedure:  RIGHT TOTAL KNEE ARTHROPLASTY;  Surgeon: Mcarthur Rossetti, MD;  Location: [facility];  Service: Orthopedics;  Laterality: Right;   TOTAL KNEE ARTHROPLASTY Left 12/15/2014   Procedure: LEFT TOTAL KNEE ARTHROPLASTY;  Surgeon: Mcarthur Rossetti, MD;  Location: [facility];  Service: Orthopedics;  Laterality: Left;   TUBAL LIGATION     ULNAR NERVE TRANSPOSITION     left    UPPER GI ENDOSCOPY     x 2   Patient Active Problem List   Diagnosis Date Noted   Chronic systolic heart failure ([place]) 05/21/2021   Leukocytosis 03/07/2021   Mixed hyperlipidemia 03/07/2021   Left bundle branch block 02/20/2021   Left axis deviation 02/20/2021   Acquired hallux varus of left foot 02/20/2021   Porokeratosis 12/31/2020   Sesamoiditis 12/31/2020   Hav (hallux abducto valgus), left 12/31/2020   Encounter to establish care 12/16/2020   Cigarette smoker motivated to quit 12/16/2020   Callus of foot 12/16/2020   Body mass index (BMI) of 34.0-34.9 in adult 12/16/2020   Other spondylosis with radiculopathy, cervical region 04/02/2020    Class: Chronic   Pain in finger of left hand 02/24/2020   Lung nodules 11/30/2019   Neck pain 08/04/2019   Acute pain of right shoulder 04/27/2019   Neck mass 04/27/2019   Postmenopausal bleeding 06/16/2018   S/P hysterectomy 06/16/2018   Chronic right shoulder pain 10/28/2016   Impingement syndrome of right shoulder 10/28/2016   Osteoarthritis of left knee 12/15/2014   Status post total left knee replacement 12/15/2014   Hand pain, left 04/21/2014   Cat bite of finger 04/21/2014   Asthma 04/21/2014   Tobacco abuse 04/21/2014   COPD (chronic obstructive pulmonary disease) ([place])    Effusion of left knee joint 11/03/2013   Arthritis of right knee 12/03/2012   Degenerative arthritis of hip 05/02/2011    REFERRING DIAG:  M54.50,G89.29 (ICD-10-CM) - Chronic midline low back pain without sciatica   THERAPY DIAG:  Other low back pain  Muscle weakness  (generalized)  Difficulty in walking, not elsewhere classified  Abnormal posture  Rationale for Evaluation and Treatment Rehabilitation  PERTINENT HISTORY: History of 2 lumbar decompression surgeries (at least 20 years since).  Lt THA, bilateral TKA, Arthritis, COPD, depression, GERD, see above list   PRECAUTIONS: none  SUBJECTIVE:  SUBJECTIVE STATEMENT:   She indicates back pain is somewhere in the middle  PAIN:   NPRS scale: 5/10 Pain location: thoracic spine Pain description: sharp Aggravating factors: standing, bending Relieving factors: changing positions  OBJECTIVE: (objective measures completed at initial evaluation unless otherwise dated)  PATIENT SURVEYS:  03/05/2022 FOTO eval: 43   predicted:  50   SCREENING FOR RED FLAGS: 03/05/2022 Bowel or bladder incontinence: No Cauda equina syndrome: No   COGNITION: 03/05/2022 Overall cognitive status: WFL normal                                      SENSATION: 03/05/2022 No specific testing today   MUSCLE LENGTH: 03/05/2022 No specific testing today   POSTURE: 03/05/2022 Mild forward trunk lean, forward head posture.  Reduced lumbar lordosis in standing   PALPATION: 03/05/2022 Light touch tenderness throughout lower thoracic and lumbar region grossly   LUMBAR ROM:    AROM 03/05/2022 03/27/2022  Flexion Movement to mid shin c relief noted in end range.  Aberrant movement with gower's sign upon return c pain in movement to neutral Movement to mid shin c relief noted in end range.  Aberrant movement with gower's sign upon return c pain in movement to neutral  Extension To neutral.  Pt was reluctant to perform lumbar extension due to pain 25% WFL with pain in back noted  Right lateral flexion Movement to mid thigh c back pain Movement to  mid thigh c back pain  Left lateral flexion Movement to mid thigh c back pain Movement to mid thigh c back pain  Right rotation     Left rotation      (Blank rows = not tested)   LOWER EXTREMITY ROM:        Right 03/05/2022 Left 03/05/2022  Hip flexion      Hip extension      Hip abduction      Hip adduction      Hip internal rotation      Hip external rotation      Knee flexion      Knee extension      Ankle dorsiflexion      Ankle plantarflexion      Ankle inversion      Ankle eversion       (Blank rows = not tested)   LOWER EXTREMITY MMT:     MMT Right 03/05/2022 Left 03/05/2022  Hip flexion 5/5 5/5  Hip extension      Hip abduction      Hip adduction      Hip internal rotation      Hip external rotation      Knee flexion 5/5 5/5  Knee extension 5/5 5/5  Ankle dorsiflexion 5/5 5/5  Ankle plantarflexion             Lumbar extensors    Weakness noted in return to standing in flexion mobility   (Blank rows = not tested)   LUMBAR SPECIAL TESTS:  03/05/2022 (+) slump bilateral for posterior leg pulling but not concordant symptoms   FUNCTIONAL TESTS:  03/05/2022 Unable to stand from 18 inch chair s UE assist.    GAIT: 03/05/2022 Independent ambulation   Independent ambulation   TODAY'S TREATMENT:  DATE: 04/10/22:  There Ex:  Nustep: Level 6 x 12 minutes LE/UE Supine SKTC 20 sec X 5 Supine Low trunk rotations 10 sec X 5 Supine Heel slides AROM X 10  Supine Ab set isometric hip flexion in hooklying 5 sec X 10 Supine Clams green 2X10  Standing hip extensions X 10 bilat Standing rows X 10 with green Standing shoulder extensions X 10 with green Standing lumbar extension elbows on wall  X10  Modalities:  E-stim: IFC x 10 minutes to low back with moist heat  TODAY'S TREATMENT:                                                                                              DATE: 04/03/2022:  There Ex:  Nustep: Level 6 x 12 minutes LE/UE Prone press ups on elbows 10 seconds X 5 Low trunk rotations 10 sec X 5 Ab set isometric hip flexion in hooklying 5 sec X 10 Clams red X 15 holding 3 sec Standing hip extensions X 10 bilat  Manual:  Deferred Percussion to bil lumbar paraspinals and gluteals  Modalities:  E-stim: IFC x 10 minutes to low back with moist heat    PATIENT EDUCATION:  03/05/2022 Education details: HEP, POC Person educated: Patient Education method: Consulting civil engineer, Demonstration, Verbal cues, and Handouts Education comprehension: verbalized understanding, returned demonstration, and verbal cues required   HOME EXERCISE PROGRAM: Access Code: [email or web address] URL: [email or web address] Date: 03/05/2022 Prepared by: Scot Jun   Exercises - Supine Lower Trunk Rotation  - 2-3 x daily - 7 x weekly - 1 sets - 3-5 reps - 15 hold - Hooklying Gluteal Sets  - 2-3 x daily - 7 x weekly - 1 sets - 10 reps - 5 hold - Seated Multifidi Isometric  - 2-3 x daily - 7 x weekly - 1 sets - 10 reps - 5-10 hold - Seated Abdominal Press into [employer]  - 2-3 x daily - 7 x weekly - 1 sets - 10 reps - 5-10 hold - Seated Scapular Retraction  - 2-3 x daily - 7 x weekly - 1 sets - 10 reps - 3-5 hold   ASSESSMENT:   CLINICAL IMPRESSION: Progressed her strength and mobility program as tolerated. Continued with Estim and heat at end to decrease overall pain. PT will continue to work to progress as tolerated.      OBJECTIVE IMPAIRMENTS: Abnormal gait, decreased activity tolerance, decreased balance, decreased coordination, decreased endurance, decreased mobility, difficulty walking, decreased ROM, decreased strength, hypomobility, increased fascial restrictions, impaired perceived functional ability, increased muscle spasms, impaired flexibility, improper body mechanics, postural dysfunction, and pain.    ACTIVITY LIMITATIONS: carrying, lifting, bending,  sitting, standing, squatting, sleeping, stairs, transfers, bed mobility, bathing, toileting, dressing, reach over head, locomotion level, and caring for others   PARTICIPATION LIMITATIONS: meal prep, cleaning, laundry, interpersonal relationship, shopping, community activity, and yard work   PERSONAL FACTORS:  Arthritis, COPD, depression, GERD, length of time since onset, severity of condition, long history of surgeries for back and BLE  are also affecting patient's functional outcome.    REHAB POTENTIAL: Fair to good   CLINICAL  DECISION MAKING: Evolving/moderate complexity   EVALUATION COMPLEXITY: Moderate     GOALS: Goals reviewed with patient? Yes   SHORT TERM GOALS: (target date for Short term goals are 3 weeks 03/26/2022)   1. Patient will demonstrate independent use of home exercise program to maintain progress from in clinic treatments.   Goal status: Met   LONG TERM GOALS: (target dates for all long term goals are 10 weeks  05/14/2022 )   1. Patient will demonstrate/report pain at worst less than or equal to 2/10 to facilitate minimal limitation in daily activity secondary to pain symptoms.   Goal status: on going 03/27/2022   2. Patient will demonstrate independent use of home exercise program to facilitate ability to maintain/progress functional gains from skilled physical therapy services.   Goal status: on going 03/27/2022   3. Patient will demonstrate FOTO outcome > or = 50 % to indicate reduced disability due to condition.   Goal status: on going 03/27/2022   4. Patient will demonstrate lumbar extension 100 % WFL s symptoms to facilitate upright standing, walking posture at PLOF s limitation.   Goal status: on going 03/27/2022   5.  Patient will demonstrate sit to stand to sit from 18 inch chair s UE assist to show improved strength and mobility in daily life, reduced fall risk.   Goal status: on going 03/27/2022   6.  Patient will demonstrate/report ability to  perform caregiver activity s increased symptoms.  Goal status: on going 03/27/2022     PLAN:   PT FREQUENCY: 1-2x/week   PT DURATION: 10 weeks   PLANNED INTERVENTIONS: Therapeutic exercises, Therapeutic activity, Neuro Muscular re-education, Balance training, Gait training, Patient/Family education, Joint mobilization, Stair training, DME instructions, Dry Needling, Electrical stimulation, Cryotherapy, vasopneumatic device, Moist heat, Taping, Traction Ultrasound, Ionotophoresis 61m/ml Dexamethasone, and Manual therapy.  All included unless contraindicated   PLAN FOR NEXT SESSION:  Easy progressive postural strengthening and lumbar/hip mobility gains.  Estim if desired.    BElsie Ra PT, DPT 04/10/22 10:57 AM

## 2022-04-15 ENCOUNTER — Ambulatory Visit (INDEPENDENT_AMBULATORY_CARE_PROVIDER_SITE_OTHER): Payer: Medicare Other | Admitting: Physical Therapy

## 2022-04-15 ENCOUNTER — Encounter: Payer: Self-pay | Admitting: Physical Therapy

## 2022-04-15 DIAGNOSIS — M6281 Muscle weakness (generalized): Secondary | ICD-10-CM | POA: Diagnosis not present

## 2022-04-15 DIAGNOSIS — R293 Abnormal posture: Secondary | ICD-10-CM | POA: Diagnosis not present

## 2022-04-15 DIAGNOSIS — R278 Other lack of coordination: Secondary | ICD-10-CM

## 2022-04-15 DIAGNOSIS — M5459 Other low back pain: Secondary | ICD-10-CM

## 2022-04-15 DIAGNOSIS — R262 Difficulty in walking, not elsewhere classified: Secondary | ICD-10-CM

## 2022-04-15 DIAGNOSIS — R208 Other disturbances of skin sensation: Secondary | ICD-10-CM

## 2022-04-15 DIAGNOSIS — M79601 Pain in right arm: Secondary | ICD-10-CM

## 2022-04-15 NOTE — Therapy (Signed)
OUTPATIENT PHYSICAL THERAPY TREATMENT NOTE   Patient Name: Katrina [name] MRN: [id number] DOB:1955-05-15, 67 y.o., female Today's Date: 04/15/2022  PCP: Leretha Pol NP  REFERRING PROVIDER: Callie Fielding, MD    END OF SESSION:   PT End of Session - 04/15/22 1112     Visit Number 7    Number of Visits 20    Date for PT Re-Evaluation 05/14/22    Authorization Type [employer] Medicare $10 copay    Progress Note Due on Visit 10    PT Start Time 1015    PT Stop Time 1055    PT Time Calculation (min) 40 min    Activity Tolerance Patient limited by pain    Behavior During Therapy [facility] for tasks assessed/performed                Past Medical History:  Diagnosis Date   Anxiety    Arthritis    knees, hips, elbows    Asthma    hx of asthma 3/12- hospitalized    COPD (chronic obstructive pulmonary disease) ([place])    Depression    GERD (gastroesophageal reflux disease)    PRIOR TO HAVING GALLBLADDER REMOVED   Headache    from her neck   Left bundle branch block 06/28/2010   Lung infection 2015   Lung infection    Past Surgical History:  Procedure Laterality Date   BACK SURGERY     x2   CARPAL TUNNEL RELEASE     left    CESAREAN SECTION     x 2   CHOLECYSTECTOMY     COLONOSCOPY  10/2017   DILATATION & CURETTAGE/HYSTEROSCOPY WITH MYOSURE N/A 12/10/2017   Procedure: DILATATION & CURETTAGE/HYSTEROSCOPY WITH MYOSURE;  Surgeon: Christophe Louis, MD;  Location: [facility];  Service: Gynecology;  Laterality: N/A;  POSSIBLE MYOMECTOMY VS POLYPECTOMY WITH MYOSURE   DILATION AND CURETTAGE OF UTERUS     ELBOW SURGERY     JOINT REPLACEMENT     KNEE ARTHROSCOPY     bilateral x 2    KNEE ARTHROSCOPY Left 11/03/2013   Procedure: LEFT KNEE ARTHROSCOPY WITH DEBRIDEMENT, PARTIAL SYNOVECTOMY;  Surgeon: Mcarthur Rossetti, MD;  Location: [facility];  Service: Orthopedics;  Laterality: Left;   LAPAROSCOPY     left elbow surgery      OTHER SURGICAL HISTORY     arthroswcopic surgery right knee     OTHER SURGICAL HISTORY     arthroscopic surgery left knee x 2    OTHER SURGICAL HISTORY     bunionectomy right foot    OTHER SURGICAL HISTORY     C Section x 2    OTHER SURGICAL HISTORY     carpal tunnel on left    POSTERIOR CERVICAL FUSION/FORAMINOTOMY N/A 04/02/2020   Procedure: RIGHT C5-6 FORAMINOTOMY;  Surgeon: Jessy Oto, MD;  Location: [place];  Service: Orthopedics;  Laterality: N/A;   RADIOLOGY WITH ANESTHESIA N/A 07/28/2019   Procedure: MRI WITH ANESTHESIA OF NECK SOFT TISSUE ONLY WITH AND WITHOUT CONTRAST;  Surgeon: Radiologist, Medication, MD;  Location: [place];  Service: Radiology;  Laterality: N/A;   RADIOLOGY WITH ANESTHESIA N/A 10/04/2019   Procedure: MRI WITH ANESTHESIA RIGHT SHOULDER WITHOUT CONTRAST,MRI OF CERVICAL SPINE WITHOUT CONTRAST;  Surgeon: Radiologist, Medication, MD;  Location: [place];  Service: Radiology;  Laterality: N/A;   RADIOLOGY WITH ANESTHESIA N/A 08/30/2020   Procedure: MRI WITH ANESTHESIA CERVICAL SPINE WITHOUT CONTRAST;  Surgeon: Radiologist, Medication, MD;  Location: [place];  Service: Radiology;  Laterality: N/A;   right foot bunionectomy      RIGHT/LEFT HEART CATH AND CORONARY ANGIOGRAPHY N/A 04/26/2021   Procedure: RIGHT/LEFT HEART CATH AND CORONARY ANGIOGRAPHY;  Surgeon: Early Osmond, MD;  Location: [place] [facility];  Service: Cardiovascular;  Laterality: N/A;   ROBOTIC ASSISTED TOTAL HYSTERECTOMY WITH BILATERAL SALPINGO OOPHERECTOMY Bilateral 06/16/2018   Procedure: XI ROBOTIC ASSISTED TOTAL HYSTERECTOMY WITH RIGHT SALPINGO OOPHORECTOMY;  Surgeon: Christophe Louis, MD;  Location: [place];  Service: Gynecology;  Laterality: Bilateral;   TOTAL HIP ARTHROPLASTY  05/02/2011   Procedure: TOTAL HIP ARTHROPLASTY ANTERIOR APPROACH;  Surgeon: Mcarthur Rossetti, MD;  Location: [facility];  Service: Orthopedics;  Laterality: Left;  Left Total Hip Arthroplasty, Direct Anterior Approach   TOTAL KNEE ARTHROPLASTY Right 12/03/2012   Procedure:  RIGHT TOTAL KNEE ARTHROPLASTY;  Surgeon: Mcarthur Rossetti, MD;  Location: [facility];  Service: Orthopedics;  Laterality: Right;   TOTAL KNEE ARTHROPLASTY Left 12/15/2014   Procedure: LEFT TOTAL KNEE ARTHROPLASTY;  Surgeon: Mcarthur Rossetti, MD;  Location: [facility];  Service: Orthopedics;  Laterality: Left;   TUBAL LIGATION     ULNAR NERVE TRANSPOSITION     left    UPPER GI ENDOSCOPY     x 2   Patient Active Problem List   Diagnosis Date Noted   Chronic systolic heart failure ([place]) 05/21/2021   Leukocytosis 03/07/2021   Mixed hyperlipidemia 03/07/2021   Left bundle branch block 02/20/2021   Left axis deviation 02/20/2021   Acquired hallux varus of left foot 02/20/2021   Porokeratosis 12/31/2020   Sesamoiditis 12/31/2020   Hav (hallux abducto valgus), left 12/31/2020   Encounter to establish care 12/16/2020   Cigarette smoker motivated to quit 12/16/2020   Callus of foot 12/16/2020   Body mass index (BMI) of 34.0-34.9 in adult 12/16/2020   Other spondylosis with radiculopathy, cervical region 04/02/2020    Class: Chronic   Pain in finger of left hand 02/24/2020   Lung nodules 11/30/2019   Neck pain 08/04/2019   Acute pain of right shoulder 04/27/2019   Neck mass 04/27/2019   Postmenopausal bleeding 06/16/2018   S/P hysterectomy 06/16/2018   Chronic right shoulder pain 10/28/2016   Impingement syndrome of right shoulder 10/28/2016   Osteoarthritis of left knee 12/15/2014   Status post total left knee replacement 12/15/2014   Hand pain, left 04/21/2014   Cat bite of finger 04/21/2014   Asthma 04/21/2014   Tobacco abuse 04/21/2014   COPD (chronic obstructive pulmonary disease) ([place])    Effusion of left knee joint 11/03/2013   Arthritis of right knee 12/03/2012   Degenerative arthritis of hip 05/02/2011    REFERRING DIAG:  M54.50,G89.29 (ICD-10-CM) - Chronic midline low back pain without sciatica   THERAPY DIAG:  Other low back pain  Muscle weakness  (generalized)  Difficulty in walking, not elsewhere classified  Abnormal posture  Pain in right arm  Other disturbances of skin sensation  Other lack of coordination  Rationale for Evaluation and Treatment Rehabilitation  PERTINENT HISTORY: History of 2 lumbar decompression surgeries (at least 20 years since).  Lt THA, bilateral TKA, Arthritis, COPD, depression, GERD, see above list   PRECAUTIONS: none  SUBJECTIVE:  SUBJECTIVE STATEMENT:   Pt arriving reporting 5-6/10 pain in her low back.     PAIN:   NPRS scale: 5/10 Pain location: thoracic spine Pain description: sharp Aggravating factors: standing, bending Relieving factors: changing positions  OBJECTIVE: (objective measures completed at initial evaluation unless otherwise dated)  PATIENT SURVEYS:  03/05/2022 FOTO eval: 43   predicted:  50   SCREENING FOR RED FLAGS: 03/05/2022 Bowel or bladder incontinence: No Cauda equina syndrome: No   COGNITION: 03/05/2022 Overall cognitive status: WFL normal                                      SENSATION: 03/05/2022 No specific testing today   MUSCLE LENGTH: 03/05/2022 No specific testing today   POSTURE: 03/05/2022 Mild forward trunk lean, forward head posture.  Reduced lumbar lordosis in standing   PALPATION: 03/05/2022 Light touch tenderness throughout lower thoracic and lumbar region grossly   LUMBAR ROM:    AROM 03/05/2022 03/27/2022  Flexion Movement to mid shin c relief noted in end range.  Aberrant movement with gower's sign upon return c pain in movement to neutral Movement to mid shin c relief noted in end range.  Aberrant movement with gower's sign upon return c pain in movement to neutral  Extension To neutral.  Pt was reluctant to perform lumbar extension due to pain 25%  WFL with pain in back noted  Right lateral flexion Movement to mid thigh c back pain Movement to mid thigh c back pain  Left lateral flexion Movement to mid thigh c back pain Movement to mid thigh c back pain  Right rotation     Left rotation      (Blank rows = not tested)   LOWER EXTREMITY ROM:        Right 03/05/2022 Left 03/05/2022  Hip flexion      Hip extension      Hip abduction      Hip adduction      Hip internal rotation      Hip external rotation      Knee flexion      Knee extension      Ankle dorsiflexion      Ankle plantarflexion      Ankle inversion      Ankle eversion       (Blank rows = not tested)   LOWER EXTREMITY MMT:     MMT Right 03/05/2022 Left 03/05/2022  Hip flexion 5/5 5/5  Hip extension      Hip abduction      Hip adduction      Hip internal rotation      Hip external rotation      Knee flexion 5/5 5/5  Knee extension 5/5 5/5  Ankle dorsiflexion 5/5 5/5  Ankle plantarflexion             Lumbar extensors    Weakness noted in return to standing in flexion mobility   (Blank rows = not tested)   LUMBAR SPECIAL TESTS:  03/05/2022 (+) slump bilateral for posterior leg pulling but not concordant symptoms   FUNCTIONAL TESTS:  03/05/2022 Unable to stand from 18 inch chair s UE assist.    GAIT: 03/05/2022 Independent ambulation   Independent ambulation   TODAY'S TREATMENT:  04/15/22:  There Ex:  Nustep: Level 6 x 12 minutes LE/UE Leg Press: 62# 3 x 10 bil LE's Sit to stand: x 10  Rows: x 20 Level 3 band Supine trunk rotation x 3 holding 20 sec Modalities:  E-stim: IFC x 12 minutes to low back with moist heat (attempted in supine but pt unable to tolerate, performed in seated position)                                                                                                DATE: 04/10/22:  There Ex:  Nustep: Level 6 x 12 minutes LE/UE Supine SKTC 20 sec X 5 Supine Low trunk rotations 10 sec X 5 Supine Heel slides AROM X 10   Supine Ab set isometric hip flexion in hooklying 5 sec X 10 Supine Clams green 2X10  Standing hip extensions X 10 bilat Standing rows X 10 with green Standing shoulder extensions X 10 with green Standing lumbar extension elbows on wall  X10  Modalities:  E-stim: IFC x 10 minutes to low back with moist heat  TODAY'S TREATMENT:                                                                                             DATE: 04/03/2022:  There Ex:  Nustep: Level 6 x 12 minutes LE/UE Prone press ups on elbows 10 seconds X 5 Low trunk rotations 10 sec X 5 Ab set isometric hip flexion in hooklying 5 sec X 10 Clams red X 15 holding 3 sec Standing hip extensions X 10 bilat  Manual:  Deferred Percussion to bil lumbar paraspinals and gluteals  Modalities:  E-stim: IFC x 10 minutes to low back with moist heat    PATIENT EDUCATION:  03/05/2022 Education details: HEP, POC Person educated: Patient Education method: Consulting civil engineer, Demonstration, Verbal cues, and Handouts Education comprehension: verbalized understanding, returned demonstration, and verbal cues required   HOME EXERCISE PROGRAM: Access Code: [email or web address] URL: [email or web address] Date: 03/05/2022 Prepared by: Scot Jun   Exercises - Supine Lower Trunk Rotation  - 2-3 x daily - 7 x weekly - 1 sets - 3-5 reps - 15 hold - Hooklying Gluteal Sets  - 2-3 x daily - 7 x weekly - 1 sets - 10 reps - 5 hold - Seated Multifidi Isometric  - 2-3 x daily - 7 x weekly - 1 sets - 10 reps - 5-10 hold - Seated Abdominal Press into [employer]  - 2-3 x daily - 7 x weekly - 1 sets - 10 reps - 5-10 hold - Seated Scapular Retraction  - 2-3 x daily - 7 x weekly - 1 sets - 10 reps - 3-5 hold   ASSESSMENT:   CLINICAL IMPRESSION: Treatment focusing on LE strengthening and lumbar stretching. Pt instructed in core activation during exercises. Pt requiring less intensity today when using the E-stim which had to be  lowered during  treatment. Continue with skilled PT to maximize pt's function.     OBJECTIVE IMPAIRMENTS: Abnormal gait, decreased activity tolerance, decreased balance, decreased coordination, decreased endurance, decreased mobility, difficulty walking, decreased ROM, decreased strength, hypomobility, increased fascial restrictions, impaired perceived functional ability, increased muscle spasms, impaired flexibility, improper body mechanics, postural dysfunction, and pain.    ACTIVITY LIMITATIONS: carrying, lifting, bending, sitting, standing, squatting, sleeping, stairs, transfers, bed mobility, bathing, toileting, dressing, reach over head, locomotion level, and caring for others   PARTICIPATION LIMITATIONS: meal prep, cleaning, laundry, interpersonal relationship, shopping, community activity, and yard work   PERSONAL FACTORS:  Arthritis, COPD, depression, GERD, length of time since onset, severity of condition, long history of surgeries for back and BLE  are also affecting patient's functional outcome.    REHAB POTENTIAL: Fair to good   CLINICAL DECISION MAKING: Evolving/moderate complexity   EVALUATION COMPLEXITY: Moderate     GOALS: Goals reviewed with patient? Yes   SHORT TERM GOALS: (target date for Short term goals are 3 weeks 03/26/2022)   1. Patient will demonstrate independent use of home exercise program to maintain progress from in clinic treatments.   Goal status: Met   LONG TERM GOALS: (target dates for all long term goals are 10 weeks  05/14/2022 )   1. Patient will demonstrate/report pain at worst less than or equal to 2/10 to facilitate minimal limitation in daily activity secondary to pain symptoms.   Goal status: on going 03/27/2022   2. Patient will demonstrate independent use of home exercise program to facilitate ability to maintain/progress functional gains from skilled physical therapy services.   Goal status: on going 03/27/2022   3. Patient will demonstrate FOTO  outcome > or = 50 % to indicate reduced disability due to condition.   Goal status: on going 03/27/2022   4. Patient will demonstrate lumbar extension 100 % WFL s symptoms to facilitate upright standing, walking posture at PLOF s limitation.   Goal status: on going 03/27/2022   5.  Patient will demonstrate sit to stand to sit from 18 inch chair s UE assist to show improved strength and mobility in daily life, reduced fall risk.   Goal status: on going 03/27/2022   6.  Patient will demonstrate/report ability to perform caregiver activity s increased symptoms.  Goal status: on going 03/27/2022     PLAN:   PT FREQUENCY: 1-2x/week   PT DURATION: 10 weeks   PLANNED INTERVENTIONS: Therapeutic exercises, Therapeutic activity, Neuro Muscular re-education, Balance training, Gait training, Patient/Family education, Joint mobilization, Stair training, DME instructions, Dry Needling, Electrical stimulation, Cryotherapy, vasopneumatic device, Moist heat, Taping, Traction Ultrasound, Ionotophoresis '4mg'$ /ml Dexamethasone, and Manual therapy.  All included unless contraindicated   PLAN FOR NEXT SESSION:  Easy progressive postural and LE strengthening and lumbar/hip mobility gains.  Estim if desired.    Kearney Hard, PT, MPT 04/15/22 11:13 AM   04/15/22 11:13 AM

## 2022-04-17 ENCOUNTER — Ambulatory Visit (INDEPENDENT_AMBULATORY_CARE_PROVIDER_SITE_OTHER): Payer: Medicare Other | Admitting: Physical Therapy

## 2022-04-17 ENCOUNTER — Encounter: Payer: Self-pay | Admitting: Physical Therapy

## 2022-04-17 DIAGNOSIS — M5459 Other low back pain: Secondary | ICD-10-CM | POA: Diagnosis not present

## 2022-04-17 DIAGNOSIS — R262 Difficulty in walking, not elsewhere classified: Secondary | ICD-10-CM | POA: Diagnosis not present

## 2022-04-17 DIAGNOSIS — M6281 Muscle weakness (generalized): Secondary | ICD-10-CM | POA: Diagnosis not present

## 2022-04-17 NOTE — Therapy (Signed)
OUTPATIENT PHYSICAL THERAPY TREATMENT NOTE   Patient Name: Katrina [name] MRN: [id number] DOB:01-12-1956, 67 y.o., female Today's Date: 04/17/2022  PCP: Leretha Pol NP  REFERRING PROVIDER: Callie Fielding, MD    END OF SESSION:   PT End of Session - 04/17/22 1011     Visit Number 8    Number of Visits 20    Date for PT Re-Evaluation 05/14/22    Authorization Type [employer] Medicare $10 copay    Progress Note Due on Visit 10    PT Start Time 1015    PT Stop Time 1055    PT Time Calculation (min) 40 min    Activity Tolerance Patient limited by pain;Patient tolerated treatment well    Behavior During Therapy [facility] for tasks assessed/performed                Past Medical History:  Diagnosis Date   Anxiety    Arthritis    knees, hips, elbows    Asthma    hx of asthma 3/12- hospitalized    COPD (chronic obstructive pulmonary disease) ([place])    Depression    GERD (gastroesophageal reflux disease)    PRIOR TO HAVING GALLBLADDER REMOVED   Headache    from her neck   Left bundle branch block 06/28/2010   Lung infection 2015   Lung infection    Past Surgical History:  Procedure Laterality Date   BACK SURGERY     x2   CARPAL TUNNEL RELEASE     left    CESAREAN SECTION     x 2   CHOLECYSTECTOMY     COLONOSCOPY  10/2017   DILATATION & CURETTAGE/HYSTEROSCOPY WITH MYOSURE N/A 12/10/2017   Procedure: DILATATION & CURETTAGE/HYSTEROSCOPY WITH MYOSURE;  Surgeon: Christophe Louis, MD;  Location: [facility];  Service: Gynecology;  Laterality: N/A;  POSSIBLE MYOMECTOMY VS POLYPECTOMY WITH MYOSURE   DILATION AND CURETTAGE OF UTERUS     ELBOW SURGERY     JOINT REPLACEMENT     KNEE ARTHROSCOPY     bilateral x 2    KNEE ARTHROSCOPY Left 11/03/2013   Procedure: LEFT KNEE ARTHROSCOPY WITH DEBRIDEMENT, PARTIAL SYNOVECTOMY;  Surgeon: Mcarthur Rossetti, MD;  Location: [facility];  Service: Orthopedics;  Laterality: Left;   LAPAROSCOPY     left elbow surgery      OTHER SURGICAL HISTORY      arthroswcopic surgery right knee    OTHER SURGICAL HISTORY     arthroscopic surgery left knee x 2    OTHER SURGICAL HISTORY     bunionectomy right foot    OTHER SURGICAL HISTORY     C Section x 2    OTHER SURGICAL HISTORY     carpal tunnel on left    POSTERIOR CERVICAL FUSION/FORAMINOTOMY N/A 04/02/2020   Procedure: RIGHT C5-6 FORAMINOTOMY;  Surgeon: Jessy Oto, MD;  Location: [place];  Service: Orthopedics;  Laterality: N/A;   RADIOLOGY WITH ANESTHESIA N/A 07/28/2019   Procedure: MRI WITH ANESTHESIA OF NECK SOFT TISSUE ONLY WITH AND WITHOUT CONTRAST;  Surgeon: Radiologist, Medication, MD;  Location: [place];  Service: Radiology;  Laterality: N/A;   RADIOLOGY WITH ANESTHESIA N/A 10/04/2019   Procedure: MRI WITH ANESTHESIA RIGHT SHOULDER WITHOUT CONTRAST,MRI OF CERVICAL SPINE WITHOUT CONTRAST;  Surgeon: Radiologist, Medication, MD;  Location: [place];  Service: Radiology;  Laterality: N/A;   RADIOLOGY WITH ANESTHESIA N/A 08/30/2020   Procedure: MRI WITH ANESTHESIA CERVICAL SPINE WITHOUT CONTRAST;  Surgeon: Radiologist, Medication, MD;  Location: [place];  Service: Radiology;  Laterality: N/A;   right foot bunionectomy      RIGHT/LEFT HEART CATH AND CORONARY ANGIOGRAPHY N/A 04/26/2021   Procedure: RIGHT/LEFT HEART CATH AND CORONARY ANGIOGRAPHY;  Surgeon: Early Osmond, MD;  Location: [facility];  Service: Cardiovascular;  Laterality: N/A;   ROBOTIC ASSISTED TOTAL HYSTERECTOMY WITH BILATERAL SALPINGO OOPHERECTOMY Bilateral 06/16/2018   Procedure: XI ROBOTIC ASSISTED TOTAL HYSTERECTOMY WITH RIGHT SALPINGO OOPHORECTOMY;  Surgeon: Christophe Louis, MD;  Location: [place];  Service: Gynecology;  Laterality: Bilateral;   TOTAL HIP ARTHROPLASTY  05/02/2011   Procedure: TOTAL HIP ARTHROPLASTY ANTERIOR APPROACH;  Surgeon: Mcarthur Rossetti, MD;  Location: [facility];  Service: Orthopedics;  Laterality: Left;  Left Total Hip Arthroplasty, Direct Anterior Approach   TOTAL KNEE ARTHROPLASTY  Right 12/03/2012   Procedure: RIGHT TOTAL KNEE ARTHROPLASTY;  Surgeon: Mcarthur Rossetti, MD;  Location: [facility];  Service: Orthopedics;  Laterality: Right;   TOTAL KNEE ARTHROPLASTY Left 12/15/2014   Procedure: LEFT TOTAL KNEE ARTHROPLASTY;  Surgeon: Mcarthur Rossetti, MD;  Location: [facility];  Service: Orthopedics;  Laterality: Left;   TUBAL LIGATION     ULNAR NERVE TRANSPOSITION     left    UPPER GI ENDOSCOPY     x 2   Patient Active Problem List   Diagnosis Date Noted   Chronic systolic heart failure ([place]) 05/21/2021   Leukocytosis 03/07/2021   Mixed hyperlipidemia 03/07/2021   Left bundle branch block 02/20/2021   Left axis deviation 02/20/2021   Acquired hallux varus of left foot 02/20/2021   Porokeratosis 12/31/2020   Sesamoiditis 12/31/2020   Hav (hallux abducto valgus), left 12/31/2020   Encounter to establish care 12/16/2020   Cigarette smoker motivated to quit 12/16/2020   Callus of foot 12/16/2020   Body mass index (BMI) of 34.0-34.9 in adult 12/16/2020   Other spondylosis with radiculopathy, cervical region 04/02/2020    Class: Chronic   Pain in finger of left hand 02/24/2020   Lung nodules 11/30/2019   Neck pain 08/04/2019   Acute pain of right shoulder 04/27/2019   Neck mass 04/27/2019   Postmenopausal bleeding 06/16/2018   S/P hysterectomy 06/16/2018   Chronic right shoulder pain 10/28/2016   Impingement syndrome of right shoulder 10/28/2016   Osteoarthritis of left knee 12/15/2014   Status post total left knee replacement 12/15/2014   Hand pain, left 04/21/2014   Cat bite of finger 04/21/2014   Asthma 04/21/2014   Tobacco abuse 04/21/2014   COPD (chronic obstructive pulmonary disease) ([place])    Effusion of left knee joint 11/03/2013   Arthritis of right knee 12/03/2012   Degenerative arthritis of hip 05/02/2011    REFERRING DIAG:  M54.50,G89.29 (ICD-10-CM) - Chronic midline low back pain without sciatica   THERAPY DIAG:  Other low back  pain  Muscle weakness (generalized)  Difficulty in walking, not elsewhere classified  Rationale for Evaluation and Treatment Rehabilitation  PERTINENT HISTORY: History of 2 lumbar decompression surgeries (at least 20 years since).  Lt THA, bilateral TKA, Arthritis, COPD, depression, GERD, see above list   PRECAUTIONS: none  SUBJECTIVE:  SUBJECTIVE STATEMENT:   Pt arriving reporting 5/10 pain in her low back. She says she will see heart doctor and will start Pain management clinic at end of the month.     PAIN:   NPRS scale: 5/10 Pain location: thoracic spine Pain description: sharp Aggravating factors: standing, bending Relieving factors: changing positions  OBJECTIVE: (objective measures completed at initial evaluation unless otherwise dated)  PATIENT SURVEYS:  03/05/2022 FOTO eval: 43   predicted:  50   SCREENING FOR RED FLAGS: 03/05/2022 Bowel or bladder incontinence: No Cauda equina syndrome: No   COGNITION: 03/05/2022 Overall cognitive status: WFL normal                                      SENSATION: 03/05/2022 No specific testing today   MUSCLE LENGTH: 03/05/2022 No specific testing today   POSTURE: 03/05/2022 Mild forward trunk lean, forward head posture.  Reduced lumbar lordosis in standing   PALPATION: 03/05/2022 Light touch tenderness throughout lower thoracic and lumbar region grossly   LUMBAR ROM:    AROM 03/05/2022 03/27/2022  Flexion Movement to mid shin c relief noted in end range.  Aberrant movement with gower's sign upon return c pain in movement to neutral Movement to mid shin c relief noted in end range.  Aberrant movement with gower's sign upon return c pain in movement to neutral  Extension To neutral.  Pt was reluctant to perform lumbar extension due to pain  25% WFL with pain in back noted  Right lateral flexion Movement to mid thigh c back pain Movement to mid thigh c back pain  Left lateral flexion Movement to mid thigh c back pain Movement to mid thigh c back pain  Right rotation     Left rotation      (Blank rows = not tested)   LOWER EXTREMITY ROM:        Right 03/05/2022 Left 03/05/2022  Hip flexion      Hip extension      Hip abduction      Hip adduction      Hip internal rotation      Hip external rotation      Knee flexion      Knee extension      Ankle dorsiflexion      Ankle plantarflexion      Ankle inversion      Ankle eversion       (Blank rows = not tested)   LOWER EXTREMITY MMT:     MMT Right 03/05/2022 Left 03/05/2022  Hip flexion 5/5 5/5  Hip extension      Hip abduction      Hip adduction      Hip internal rotation      Hip external rotation      Knee flexion 5/5 5/5  Knee extension 5/5 5/5  Ankle dorsiflexion 5/5 5/5  Ankle plantarflexion             Lumbar extensors    Weakness noted in return to standing in flexion mobility   (Blank rows = not tested)   LUMBAR SPECIAL TESTS:  03/05/2022 (+) slump bilateral for posterior leg pulling but not concordant symptoms   FUNCTIONAL TESTS:  03/05/2022 Unable to stand from 18 inch chair s UE assist.    GAIT: 03/05/2022 Independent ambulation   Independent ambulation   TODAY'S TREATMENT:  04/17/22:  There Ex:  Nustep: Level 6  x 13:45 minutes (to complete 3 laps for functional endurance) LE/UE Leg Press: 62# 3 x 10 bil LE's Sit to stand: x 10  Rows: x 20 with green band Shoulder extensions  X20 with green band Seated isometric hip flexion 5 sec X 10 bilat Seated isometric back extension 5 sec X 10 pushing into yellow ball Modalities:  E-stim: deferred today.  04/15/22:  There Ex:  Nustep: Level 6 x 12 minutes LE/UE Leg Press: 62# 3 x 10 bil LE's Sit to stand: x 10  Rows: x 20 Level 3 band Supine trunk rotation x 3 holding 20  sec Modalities:  E-stim: IFC x 12 minutes to low back with moist heat (attempted in supine but pt unable to tolerate, performed in seated position)      PATIENT EDUCATION:  03/05/2022 Education details: HEP, POC Person educated: Patient Education method: Consulting civil engineer, Media planner, Verbal cues, and Handouts Education comprehension: verbalized understanding, returned demonstration, and verbal cues required   HOME EXERCISE PROGRAM: Access Code: [email or web address] URL: [email or web address] Date: 03/05/2022 Prepared by: Scot Jun   Exercises - Supine Lower Trunk Rotation  - 2-3 x daily - 7 x weekly - 1 sets - 3-5 reps - 15 hold - Hooklying Gluteal Sets  - 2-3 x daily - 7 x weekly - 1 sets - 10 reps - 5 hold - Seated Multifidi Isometric  - 2-3 x daily - 7 x weekly - 1 sets - 10 reps - 5-10 hold - Seated Abdominal Press into [employer]  - 2-3 x daily - 7 x weekly - 1 sets - 10 reps - 5-10 hold - Seated Scapular Retraction  - 2-3 x daily - 7 x weekly - 1 sets - 10 reps - 3-5 hold   ASSESSMENT:   CLINICAL IMPRESSION: Continued to work to improve her functional endurance, activity tolerance and core stabilization today. She deferred TENS unit today and we will work to move away from this to save time for more exercise/activity.     OBJECTIVE IMPAIRMENTS: Abnormal gait, decreased activity tolerance, decreased balance, decreased coordination, decreased endurance, decreased mobility, difficulty walking, decreased ROM, decreased strength, hypomobility, increased fascial restrictions, impaired perceived functional ability, increased muscle spasms, impaired flexibility, improper body mechanics, postural dysfunction, and pain.    ACTIVITY LIMITATIONS: carrying, lifting, bending, sitting, standing, squatting, sleeping, stairs, transfers, bed mobility, bathing, toileting, dressing, reach over head, locomotion level, and caring for others   PARTICIPATION LIMITATIONS: meal prep, cleaning,  laundry, interpersonal relationship, shopping, community activity, and yard work   PERSONAL FACTORS:  Arthritis, COPD, depression, GERD, length of time since onset, severity of condition, long history of surgeries for back and BLE  are also affecting patient's functional outcome.    REHAB POTENTIAL: Fair to good   CLINICAL DECISION MAKING: Evolving/moderate complexity   EVALUATION COMPLEXITY: Moderate     GOALS: Goals reviewed with patient? Yes   SHORT TERM GOALS: (target date for Short term goals are 3 weeks 03/26/2022)   1. Patient will demonstrate independent use of home exercise program to maintain progress from in clinic treatments.   Goal status: Met   LONG TERM GOALS: (target dates for all long term goals are 10 weeks  05/14/2022 )   1. Patient will demonstrate/report pain at worst less than or equal to 2/10 to facilitate minimal limitation in daily activity secondary to pain symptoms.   Goal status: on going 03/27/2022   2. Patient will demonstrate independent use of home exercise program to facilitate ability to maintain/progress functional gains  from skilled physical therapy services.   Goal status: on going 03/27/2022   3. Patient will demonstrate FOTO outcome > or = 50 % to indicate reduced disability due to condition.   Goal status: on going 03/27/2022   4. Patient will demonstrate lumbar extension 100 % WFL s symptoms to facilitate upright standing, walking posture at PLOF s limitation.   Goal status: on going 03/27/2022   5.  Patient will demonstrate sit to stand to sit from 18 inch chair s UE assist to show improved strength and mobility in daily life, reduced fall risk.   Goal status: on going 03/27/2022   6.  Patient will demonstrate/report ability to perform caregiver activity s increased symptoms.  Goal status: on going 03/27/2022     PLAN:   PT FREQUENCY: 1-2x/week   PT DURATION: 10 weeks   PLANNED INTERVENTIONS: Therapeutic exercises, Therapeutic  activity, Neuro Muscular re-education, Balance training, Gait training, Patient/Family education, Joint mobilization, Stair training, DME instructions, Dry Needling, Electrical stimulation, Cryotherapy, vasopneumatic device, Moist heat, Taping, Traction Ultrasound, Ionotophoresis '4mg'$ /ml Dexamethasone, and Manual therapy.  All included unless contraindicated   PLAN FOR NEXT SESSION:  Easy progressive postural and LE strengthening and lumbar/hip mobility gains, functional endurance and activity tolerance    Elsie Ra, PT, DPT 04/17/22 10:12 AM

## 2022-04-22 ENCOUNTER — Encounter: Payer: Self-pay | Admitting: Physical Therapy

## 2022-04-22 ENCOUNTER — Ambulatory Visit (INDEPENDENT_AMBULATORY_CARE_PROVIDER_SITE_OTHER): Payer: Medicare Other | Admitting: Physical Therapy

## 2022-04-22 DIAGNOSIS — M79601 Pain in right arm: Secondary | ICD-10-CM

## 2022-04-22 DIAGNOSIS — R293 Abnormal posture: Secondary | ICD-10-CM

## 2022-04-22 DIAGNOSIS — M5459 Other low back pain: Secondary | ICD-10-CM

## 2022-04-22 DIAGNOSIS — R278 Other lack of coordination: Secondary | ICD-10-CM

## 2022-04-22 DIAGNOSIS — R262 Difficulty in walking, not elsewhere classified: Secondary | ICD-10-CM | POA: Diagnosis not present

## 2022-04-22 DIAGNOSIS — M6281 Muscle weakness (generalized): Secondary | ICD-10-CM

## 2022-04-22 DIAGNOSIS — R208 Other disturbances of skin sensation: Secondary | ICD-10-CM

## 2022-04-22 NOTE — Therapy (Signed)
OUTPATIENT PHYSICAL THERAPY TREATMENT NOTE   Patient Name: Katrina [name] MRN: [id number] DOB:1955-11-13, 67 y.o., female Today's Date: 04/22/2022  PCP: Leretha Pol NP  REFERRING PROVIDER: Callie Fielding, MD    END OF SESSION:   PT End of Session - 04/22/22 1059     Visit Number 9    Number of Visits 20    Date for PT Re-Evaluation 05/14/22    Authorization Type [employer] Medicare $10 copay    Progress Note Due on Visit 10    PT Start Time 1015    PT Stop Time 1055    PT Time Calculation (min) 40 min    Activity Tolerance Patient limited by pain;Patient tolerated treatment well    Behavior During Therapy [facility] for tasks assessed/performed                Past Medical History:  Diagnosis Date   Anxiety    Arthritis    knees, hips, elbows    Asthma    hx of asthma 3/12- hospitalized    COPD (chronic obstructive pulmonary disease) ([place])    Depression    GERD (gastroesophageal reflux disease)    PRIOR TO HAVING GALLBLADDER REMOVED   Headache    from her neck   Left bundle branch block 06/28/2010   Lung infection 2015   Lung infection    Past Surgical History:  Procedure Laterality Date   BACK SURGERY     x2   CARPAL TUNNEL RELEASE     left    CESAREAN SECTION     x 2   CHOLECYSTECTOMY     COLONOSCOPY  10/2017   DILATATION & CURETTAGE/HYSTEROSCOPY WITH MYOSURE N/A 12/10/2017   Procedure: DILATATION & CURETTAGE/HYSTEROSCOPY WITH MYOSURE;  Surgeon: Christophe Louis, MD;  Location: [facility];  Service: Gynecology;  Laterality: N/A;  POSSIBLE MYOMECTOMY VS POLYPECTOMY WITH MYOSURE   DILATION AND CURETTAGE OF UTERUS     ELBOW SURGERY     JOINT REPLACEMENT     KNEE ARTHROSCOPY     bilateral x 2    KNEE ARTHROSCOPY Left 11/03/2013   Procedure: LEFT KNEE ARTHROSCOPY WITH DEBRIDEMENT, PARTIAL SYNOVECTOMY;  Surgeon: Mcarthur Rossetti, MD;  Location: [facility];  Service: Orthopedics;  Laterality: Left;   LAPAROSCOPY     left elbow surgery      OTHER SURGICAL HISTORY      arthroswcopic surgery right knee    OTHER SURGICAL HISTORY     arthroscopic surgery left knee x 2    OTHER SURGICAL HISTORY     bunionectomy right foot    OTHER SURGICAL HISTORY     C Section x 2    OTHER SURGICAL HISTORY     carpal tunnel on left    POSTERIOR CERVICAL FUSION/FORAMINOTOMY N/A 04/02/2020   Procedure: RIGHT C5-6 FORAMINOTOMY;  Surgeon: Jessy Oto, MD;  Location: [facility];  Service: Orthopedics;  Laterality: N/A;   RADIOLOGY WITH ANESTHESIA N/A 07/28/2019   Procedure: MRI WITH ANESTHESIA OF NECK SOFT TISSUE ONLY WITH AND WITHOUT CONTRAST;  Surgeon: Radiologist, Medication, MD;  Location: [place];  Service: Radiology;  Laterality: N/A;   RADIOLOGY WITH ANESTHESIA N/A 10/04/2019   Procedure: MRI WITH ANESTHESIA RIGHT SHOULDER WITHOUT CONTRAST,MRI OF CERVICAL SPINE WITHOUT CONTRAST;  Surgeon: Radiologist, Medication, MD;  Location: [facility];  Service: Radiology;  Laterality: N/A;   RADIOLOGY WITH ANESTHESIA N/A 08/30/2020   Procedure: MRI WITH ANESTHESIA CERVICAL SPINE WITHOUT CONTRAST;  Surgeon: Radiologist, Medication, MD;  Location: [place];  Service: Radiology;  Laterality: N/A;   right foot bunionectomy      RIGHT/LEFT HEART CATH AND CORONARY ANGIOGRAPHY N/A 04/26/2021   Procedure: RIGHT/LEFT HEART CATH AND CORONARY ANGIOGRAPHY;  Surgeon: Early Osmond, MD;  Location: [facility];  Service: Cardiovascular;  Laterality: N/A;   ROBOTIC ASSISTED TOTAL HYSTERECTOMY WITH BILATERAL SALPINGO OOPHERECTOMY Bilateral 06/16/2018   Procedure: XI ROBOTIC ASSISTED TOTAL HYSTERECTOMY WITH RIGHT SALPINGO OOPHORECTOMY;  Surgeon: Christophe Louis, MD;  Location: [place];  Service: Gynecology;  Laterality: Bilateral;   TOTAL HIP ARTHROPLASTY  05/02/2011   Procedure: TOTAL HIP ARTHROPLASTY ANTERIOR APPROACH;  Surgeon: Mcarthur Rossetti, MD;  Location: [facility];  Service: Orthopedics;  Laterality: Left;  Left Total Hip Arthroplasty, Direct Anterior Approach   TOTAL KNEE ARTHROPLASTY  Right 12/03/2012   Procedure: RIGHT TOTAL KNEE ARTHROPLASTY;  Surgeon: Mcarthur Rossetti, MD;  Location: [facility];  Service: Orthopedics;  Laterality: Right;   TOTAL KNEE ARTHROPLASTY Left 12/15/2014   Procedure: LEFT TOTAL KNEE ARTHROPLASTY;  Surgeon: Mcarthur Rossetti, MD;  Location: [facility];  Service: Orthopedics;  Laterality: Left;   TUBAL LIGATION     ULNAR NERVE TRANSPOSITION     left    UPPER GI ENDOSCOPY     x 2   Patient Active Problem List   Diagnosis Date Noted   Chronic systolic heart failure ([place]) 05/21/2021   Leukocytosis 03/07/2021   Mixed hyperlipidemia 03/07/2021   Left bundle branch block 02/20/2021   Left axis deviation 02/20/2021   Acquired hallux varus of left foot 02/20/2021   Porokeratosis 12/31/2020   Sesamoiditis 12/31/2020   Hav (hallux abducto valgus), left 12/31/2020   Encounter to establish care 12/16/2020   Cigarette smoker motivated to quit 12/16/2020   Callus of foot 12/16/2020   Body mass index (BMI) of 34.0-34.9 in adult 12/16/2020   Other spondylosis with radiculopathy, cervical region 04/02/2020    Class: Chronic   Pain in finger of left hand 02/24/2020   Lung nodules 11/30/2019   Neck pain 08/04/2019   Acute pain of right shoulder 04/27/2019   Neck mass 04/27/2019   Postmenopausal bleeding 06/16/2018   S/P hysterectomy 06/16/2018   Chronic right shoulder pain 10/28/2016   Impingement syndrome of right shoulder 10/28/2016   Osteoarthritis of left knee 12/15/2014   Status post total left knee replacement 12/15/2014   Hand pain, left 04/21/2014   Cat bite of finger 04/21/2014   Asthma 04/21/2014   Tobacco abuse 04/21/2014   COPD (chronic obstructive pulmonary disease) ([place])    Effusion of left knee joint 11/03/2013   Arthritis of right knee 12/03/2012   Degenerative arthritis of hip 05/02/2011    REFERRING DIAG:  M54.50,G89.29 (ICD-10-CM) - Chronic midline low back pain without sciatica   THERAPY DIAG:  Other low back  pain  Muscle weakness (generalized)  Difficulty in walking, not elsewhere classified  Abnormal posture  Pain in right arm  Other disturbances of skin sensation  Other lack of coordination  Rationale for Evaluation and Treatment Rehabilitation  PERTINENT HISTORY: History of 2 lumbar decompression surgeries (at least 20 years since).  Lt THA, bilateral TKA, Arthritis, COPD, depression, GERD, see above list   PRECAUTIONS: none  SUBJECTIVE:  SUBJECTIVE STATEMENT:   Pt arriving today with 5/10 pain in her low back. Pt stating today is a rough day. Pt stating she is starting pain management at the end of the month.     PAIN:   NPRS scale: 5/10 Pain location: thoracic spine Pain description: sharp Aggravating factors: standing, bending Relieving factors: changing positions  OBJECTIVE: (objective measures completed at initial evaluation unless otherwise dated)  PATIENT SURVEYS:  03/05/2022 FOTO eval: 43   predicted:  50   SCREENING FOR RED FLAGS: 03/05/2022 Bowel or bladder incontinence: No Cauda equina syndrome: No   COGNITION: 03/05/2022 Overall cognitive status: WFL normal                                      SENSATION: 03/05/2022 No specific testing today   MUSCLE LENGTH: 03/05/2022 No specific testing today   POSTURE: 03/05/2022 Mild forward trunk lean, forward head posture.  Reduced lumbar lordosis in standing   PALPATION: 03/05/2022 Light touch tenderness throughout lower thoracic and lumbar region grossly   LUMBAR ROM:    AROM 03/05/2022 03/27/2022  Flexion Movement to mid shin c relief noted in end range.  Aberrant movement with gower's sign upon return c pain in movement to neutral Movement to mid shin c relief noted in end range.  Aberrant movement with gower's sign upon  return c pain in movement to neutral  Extension To neutral.  Pt was reluctant to perform lumbar extension due to pain 25% WFL with pain in back noted  Right lateral flexion Movement to mid thigh c back pain Movement to mid thigh c back pain  Left lateral flexion Movement to mid thigh c back pain Movement to mid thigh c back pain  Right rotation     Left rotation      (Blank rows = not tested)   LOWER EXTREMITY ROM:        Right 03/05/2022 Left 03/05/2022  Hip flexion      Hip extension      Hip abduction      Hip adduction      Hip internal rotation      Hip external rotation      Knee flexion      Knee extension      Ankle dorsiflexion      Ankle plantarflexion      Ankle inversion      Ankle eversion       (Blank rows = not tested)   LOWER EXTREMITY MMT:     MMT Right 03/05/2022 Left 03/05/2022  Hip flexion 5/5 5/5  Hip extension      Hip abduction      Hip adduction      Hip internal rotation      Hip external rotation      Knee flexion 5/5 5/5  Knee extension 5/5 5/5  Ankle dorsiflexion 5/5 5/5  Ankle plantarflexion             Lumbar extensors    Weakness noted in return to standing in flexion mobility   (Blank rows = not tested)   LUMBAR SPECIAL TESTS:  03/05/2022 (+) slump bilateral for posterior leg pulling but not concordant symptoms   FUNCTIONAL TESTS:  03/05/2022 Unable to stand from 18 inch chair s UE assist.    GAIT: 03/05/2022 Independent ambulation      TODAY'S TREATMENT:  04/22/22:  There Ex:  Nustep:  Level 5 x 13:27 minutes (to complete 3 laps for functional endurance) LE/UE Leg Press: 68# 2 x 15 bil LE's Mini squats using Red physio ball in lumbar curve Rows: 2 x 10 with green band, instructions to not shoulder hike Standing with Red physio ball in lumbar curve while performing AAROM shoulder flexion c  1#  bar 2 x 10  Shoulder adduction stretch in sitting: x 2 each UE holding 20 sec  Upper trap stretch x 2 each side holding 20  sec Modalities:  Moist heat to thoracic spine and lumbar spine during sitting activities/exercises   04/17/22:  There Ex:  Nustep: Level 6 x 13:45 minutes (to complete 3 laps for functional endurance) LE/UE Leg Press: 62# 3 x 10 bil LE's Sit to stand: x 10  Rows: x 20 with green band Shoulder extensions  X20 with green band Seated isometric hip flexion 5 sec X 10 bilat Seated isometric back extension 5 sec X 10 pushing into yellow ball Modalities:  E-stim: deferred today.  04/15/22:  There Ex:  Nustep: Level 6 x 12 minutes LE/UE Leg Press: 62# 3 x 10 bil LE's Sit to stand: x 10  Rows: x 20 Level 3 band Supine trunk rotation x 3 holding 20 sec Modalities:  E-stim: IFC x 12 minutes to low back with moist heat (attempted in supine but pt unable to tolerate, performed in seated position)      PATIENT EDUCATION:  03/05/2022 Education details: HEP, POC Person educated: Patient Education method: Consulting civil engineer, Media planner, Verbal cues, and Handouts Education comprehension: verbalized understanding, returned demonstration, and verbal cues required   HOME EXERCISE PROGRAM: Access Code: [email or web address] URL: [email or web address] Date: 03/05/2022 Prepared by: Scot Jun   Exercises - Supine Lower Trunk Rotation  - 2-3 x daily - 7 x weekly - 1 sets - 3-5 reps - 15 hold - Hooklying Gluteal Sets  - 2-3 x daily - 7 x weekly - 1 sets - 10 reps - 5 hold - Seated Multifidi Isometric  - 2-3 x daily - 7 x weekly - 1 sets - 10 reps - 5-10 hold - Seated Abdominal Press into [employer]  - 2-3 x daily - 7 x weekly - 1 sets - 10 reps - 5-10 hold - Seated Scapular Retraction  - 2-3 x daily - 7 x weekly - 1 sets - 10 reps - 3-5 hold   ASSESSMENT:   CLINICAL IMPRESSION: Pt arriving today with 5/10 pain in her thoracic and lower cervical spine. Pt tolerating exercises well. Focusing on postural correction and core strengthening. Upper trap stretch and levator stretch were added to  pt's HEP this visit. Continue skilled PT to maximize pt's function.     OBJECTIVE IMPAIRMENTS: Abnormal gait, decreased activity tolerance, decreased balance, decreased coordination, decreased endurance, decreased mobility, difficulty walking, decreased ROM, decreased strength, hypomobility, increased fascial restrictions, impaired perceived functional ability, increased muscle spasms, impaired flexibility, improper body mechanics, postural dysfunction, and pain.    ACTIVITY LIMITATIONS: carrying, lifting, bending, sitting, standing, squatting, sleeping, stairs, transfers, bed mobility, bathing, toileting, dressing, reach over head, locomotion level, and caring for others   PARTICIPATION LIMITATIONS: meal prep, cleaning, laundry, interpersonal relationship, shopping, community activity, and yard work   PERSONAL FACTORS:  Arthritis, COPD, depression, GERD, length of time since onset, severity of condition, long history of surgeries for back and BLE  are also affecting patient's functional outcome.    REHAB POTENTIAL: Fair to good   CLINICAL DECISION MAKING: Evolving/moderate complexity   EVALUATION COMPLEXITY:  Moderate     GOALS: Goals reviewed with patient? Yes   SHORT TERM GOALS: (target date for Short term goals are 3 weeks 03/26/2022)   1. Patient will demonstrate independent use of home exercise program to maintain progress from in clinic treatments.   Goal status: Met   LONG TERM GOALS: (target dates for all long term goals are 10 weeks  05/14/2022 )   1. Patient will demonstrate/report pain at worst less than or equal to 2/10 to facilitate minimal limitation in daily activity secondary to pain symptoms.   Goal status: on going 03/27/2022   2. Patient will demonstrate independent use of home exercise program to facilitate ability to maintain/progress functional gains from skilled physical therapy services.   Goal status: on going 03/27/2022   3. Patient will demonstrate FOTO  outcome > or = 50 % to indicate reduced disability due to condition.   Goal status: on going 03/27/2022   4. Patient will demonstrate lumbar extension 100 % WFL s symptoms to facilitate upright standing, walking posture at PLOF s limitation.   Goal status: on going 03/27/2022   5.  Patient will demonstrate sit to stand to sit from 18 inch chair s UE assist to show improved strength and mobility in daily life, reduced fall risk.   Goal status: on going 03/27/2022   6.  Patient will demonstrate/report ability to perform caregiver activity s increased symptoms.  Goal status: on going 03/27/2022     PLAN:   PT FREQUENCY: 1-2x/week   PT DURATION: 10 weeks   PLANNED INTERVENTIONS: Therapeutic exercises, Therapeutic activity, Neuro Muscular re-education, Balance training, Gait training, Patient/Family education, Joint mobilization, Stair training, DME instructions, Dry Needling, Electrical stimulation, Cryotherapy, vasopneumatic device, Moist heat, Taping, Traction Ultrasound, Ionotophoresis '4mg'$ /ml Dexamethasone, and Manual therapy.  All included unless contraindicated   PLAN FOR NEXT SESSION:  Easy progressive postural and LE strengthening and lumbar/hip mobility gains, functional endurance and activity tolerance Can you please add upper trap stretch and levator stretch to pt's HEP. Medbridge was down on 04/22/22 PN due at next visit    Kearney Hard, PT, MPT 04/22/22 11:01 AM   04/22/22 11:01 AM

## 2022-04-24 ENCOUNTER — Encounter: Payer: Self-pay | Admitting: Rehabilitative and Restorative Service Providers"

## 2022-04-24 ENCOUNTER — Ambulatory Visit (INDEPENDENT_AMBULATORY_CARE_PROVIDER_SITE_OTHER): Payer: Medicare Other | Admitting: Rehabilitative and Restorative Service Providers"

## 2022-04-24 DIAGNOSIS — M6281 Muscle weakness (generalized): Secondary | ICD-10-CM | POA: Diagnosis not present

## 2022-04-24 DIAGNOSIS — M5459 Other low back pain: Secondary | ICD-10-CM | POA: Diagnosis not present

## 2022-04-24 DIAGNOSIS — R262 Difficulty in walking, not elsewhere classified: Secondary | ICD-10-CM

## 2022-04-24 DIAGNOSIS — R293 Abnormal posture: Secondary | ICD-10-CM

## 2022-04-24 NOTE — Therapy (Signed)
OUTPATIENT PHYSICAL THERAPY TREATMENT NOTE /PROGRESS NOTE   Patient Name: Katrina [name] MRN: [id number] DOB:June 20, 1955, 67 y.o., female Today's Date: 04/24/2022  PCP: Leretha Pol NP  REFERRING PROVIDER: Callie Fielding, MD    END OF SESSION:   PT End of Session - 04/24/22 1018     Visit Number 10    Number of Visits 20    Date for PT Re-Evaluation 05/14/22    Authorization Type [employer] Medicare $10 copay    Progress Note Due on Visit 20    PT Start Time 1013    PT Stop Time 1053    PT Time Calculation (min) 40 min    Activity Tolerance Patient limited by pain;Patient tolerated treatment well    Behavior During Therapy [facility] for tasks assessed/performed                 Past Medical History:  Diagnosis Date   Anxiety    Arthritis    knees, hips, elbows    Asthma    hx of asthma 3/12- hospitalized    COPD (chronic obstructive pulmonary disease) ([place])    Depression    GERD (gastroesophageal reflux disease)    PRIOR TO HAVING GALLBLADDER REMOVED   Headache    from her neck   Left bundle branch block 06/28/2010   Lung infection 2015   Lung infection    Past Surgical History:  Procedure Laterality Date   BACK SURGERY     x2   CARPAL TUNNEL RELEASE     left    CESAREAN SECTION     x 2   CHOLECYSTECTOMY     COLONOSCOPY  10/2017   DILATATION & CURETTAGE/HYSTEROSCOPY WITH MYOSURE N/A 12/10/2017   Procedure: DILATATION & CURETTAGE/HYSTEROSCOPY WITH MYOSURE;  Surgeon: Christophe Louis, MD;  Location: [facility];  Service: Gynecology;  Laterality: N/A;  POSSIBLE MYOMECTOMY VS POLYPECTOMY WITH MYOSURE   DILATION AND CURETTAGE OF UTERUS     ELBOW SURGERY     JOINT REPLACEMENT     KNEE ARTHROSCOPY     bilateral x 2    KNEE ARTHROSCOPY Left 11/03/2013   Procedure: LEFT KNEE ARTHROSCOPY WITH DEBRIDEMENT, PARTIAL SYNOVECTOMY;  Surgeon: Mcarthur Rossetti, MD;  Location: [facility];  Service: Orthopedics;  Laterality: Left;   LAPAROSCOPY     left elbow surgery      OTHER  SURGICAL HISTORY     arthroswcopic surgery right knee    OTHER SURGICAL HISTORY     arthroscopic surgery left knee x 2    OTHER SURGICAL HISTORY     bunionectomy right foot    OTHER SURGICAL HISTORY     C Section x 2    OTHER SURGICAL HISTORY     carpal tunnel on left    POSTERIOR CERVICAL FUSION/FORAMINOTOMY N/A 04/02/2020   Procedure: RIGHT C5-6 FORAMINOTOMY;  Surgeon: Jessy Oto, MD;  Location: [place];  Service: Orthopedics;  Laterality: N/A;   RADIOLOGY WITH ANESTHESIA N/A 07/28/2019   Procedure: MRI WITH ANESTHESIA OF NECK SOFT TISSUE ONLY WITH AND WITHOUT CONTRAST;  Surgeon: Radiologist, Medication, MD;  Location: [place];  Service: Radiology;  Laterality: N/A;   RADIOLOGY WITH ANESTHESIA N/A 10/04/2019   Procedure: MRI WITH ANESTHESIA RIGHT SHOULDER WITHOUT CONTRAST,MRI OF CERVICAL SPINE WITHOUT CONTRAST;  Surgeon: Radiologist, Medication, MD;  Location: [place];  Service: Radiology;  Laterality: N/A;   RADIOLOGY WITH ANESTHESIA N/A 08/30/2020   Procedure: MRI WITH ANESTHESIA CERVICAL SPINE WITHOUT CONTRAST;  Surgeon: Radiologist, Medication, MD;  Location: [place];  Service: Radiology;  Laterality: N/A;   right foot bunionectomy      RIGHT/LEFT HEART CATH AND CORONARY ANGIOGRAPHY N/A 04/26/2021   Procedure: RIGHT/LEFT HEART CATH AND CORONARY ANGIOGRAPHY;  Surgeon: Early Osmond, MD;  Location: [place] [facility];  Service: Cardiovascular;  Laterality: N/A;   ROBOTIC ASSISTED TOTAL HYSTERECTOMY WITH BILATERAL SALPINGO OOPHERECTOMY Bilateral 06/16/2018   Procedure: XI ROBOTIC ASSISTED TOTAL HYSTERECTOMY WITH RIGHT SALPINGO OOPHORECTOMY;  Surgeon: Christophe Louis, MD;  Location: [place];  Service: Gynecology;  Laterality: Bilateral;   TOTAL HIP ARTHROPLASTY  05/02/2011   Procedure: TOTAL HIP ARTHROPLASTY ANTERIOR APPROACH;  Surgeon: Mcarthur Rossetti, MD;  Location: [facility];  Service: Orthopedics;  Laterality: Left;  Left Total Hip Arthroplasty, Direct Anterior Approach    TOTAL KNEE ARTHROPLASTY Right 12/03/2012   Procedure: RIGHT TOTAL KNEE ARTHROPLASTY;  Surgeon: Mcarthur Rossetti, MD;  Location: [facility];  Service: Orthopedics;  Laterality: Right;   TOTAL KNEE ARTHROPLASTY Left 12/15/2014   Procedure: LEFT TOTAL KNEE ARTHROPLASTY;  Surgeon: Mcarthur Rossetti, MD;  Location: [facility];  Service: Orthopedics;  Laterality: Left;   TUBAL LIGATION     ULNAR NERVE TRANSPOSITION     left    UPPER GI ENDOSCOPY     x 2   Patient Active Problem List   Diagnosis Date Noted   Chronic systolic heart failure ([place]) 05/21/2021   Leukocytosis 03/07/2021   Mixed hyperlipidemia 03/07/2021   Left bundle branch block 02/20/2021   Left axis deviation 02/20/2021   Acquired hallux varus of left foot 02/20/2021   Porokeratosis 12/31/2020   Sesamoiditis 12/31/2020   Hav (hallux abducto valgus), left 12/31/2020   Encounter to establish care 12/16/2020   Cigarette smoker motivated to quit 12/16/2020   Callus of foot 12/16/2020   Body mass index (BMI) of 34.0-34.9 in adult 12/16/2020   Other spondylosis with radiculopathy, cervical region 04/02/2020    Class: Chronic   Pain in finger of left hand 02/24/2020   Lung nodules 11/30/2019   Neck pain 08/04/2019   Acute pain of right shoulder 04/27/2019   Neck mass 04/27/2019   Postmenopausal bleeding 06/16/2018   S/P hysterectomy 06/16/2018   Chronic right shoulder pain 10/28/2016   Impingement syndrome of right shoulder 10/28/2016   Osteoarthritis of left knee 12/15/2014   Status post total left knee replacement 12/15/2014   Hand pain, left 04/21/2014   Cat bite of finger 04/21/2014   Asthma 04/21/2014   Tobacco abuse 04/21/2014   COPD (chronic obstructive pulmonary disease) ([place])    Effusion of left knee joint 11/03/2013   Arthritis of right knee 12/03/2012   Degenerative arthritis of hip 05/02/2011    REFERRING DIAG:  M54.50,G89.29 (ICD-10-CM) - Chronic midline low back pain without sciatica   THERAPY DIAG:   Other low back pain  Muscle weakness (generalized)  Difficulty in walking, not elsewhere classified  Abnormal posture  Rationale for Evaluation and Treatment Rehabilitation  PERTINENT HISTORY: History of 2 lumbar decompression surgeries (at least 20 years since).  Lt THA, bilateral TKA, Arthritis, COPD, depression, GERD, see above list   PRECAUTIONS: none  SUBJECTIVE:  SUBJECTIVE STATEMENT:   She indicated global rating of change +3 at this time.  She reported anything with prolonged standing is more troublesome still.  She indicated she could stand short timelines without being supported.     PAIN:  NPRS scale: 4/10, 6/10 at worst in last week.  Pain location: lower spine Pain description: sharp Aggravating factors: standing, bending Relieving factors: changing positions  OBJECTIVE: (objective measures completed at initial evaluation unless otherwise dated)  PATIENT SURVEYS:  04/24/2022:  FOTO update:  45  03/05/2022 FOTO eval: 43   predicted:  50   SCREENING FOR RED FLAGS: 03/05/2022 Bowel or bladder incontinence: No Cauda equina syndrome: No   COGNITION: 03/05/2022 Overall cognitive status: WFL normal                                      SENSATION: 03/05/2022 No specific testing today   MUSCLE LENGTH: 03/05/2022 No specific testing today   POSTURE: 03/05/2022 Mild forward trunk lean, forward head posture.  Reduced lumbar lordosis in standing   PALPATION: 03/05/2022 Light touch tenderness throughout lower thoracic and lumbar region grossly   LUMBAR ROM:    AROM 03/05/2022 03/27/2022 04/24/2022  Flexion Movement to mid shin c relief noted in end range.  Aberrant movement with gower's sign upon return c pain in movement to neutral Movement to mid shin c relief noted in end  range.  Aberrant movement with gower's sign upon return c pain in movement to neutral Relief into flexion, movement to mid shin.  Didn't require hands to return  Extension To neutral.  Pt was reluctant to perform lumbar extension due to pain 25% WFL with pain in back noted 25 -50 % c tightness  Right lateral flexion Movement to mid thigh c back pain Movement to mid thigh c back pain Movement to lateral femoral epicondyle c pain  Left lateral flexion Movement to mid thigh c back pain Movement to mid thigh c back pain Movement to lateral femoral epicondyle c pain  Right rotation      Left rotation       (Blank rows = not tested)   LOWER EXTREMITY ROM:        Right 03/05/2022 Left 03/05/2022  Hip flexion      Hip extension      Hip abduction      Hip adduction      Hip internal rotation      Hip external rotation      Knee flexion      Knee extension      Ankle dorsiflexion      Ankle plantarflexion      Ankle inversion      Ankle eversion       (Blank rows = not tested)   LOWER EXTREMITY MMT:     MMT Right 03/05/2022 Left 03/05/2022  Hip flexion 5/5 5/5  Hip extension      Hip abduction      Hip adduction      Hip internal rotation      Hip external rotation      Knee flexion 5/5 5/5  Knee extension 5/5 5/5  Ankle dorsiflexion 5/5 5/5  Ankle plantarflexion             Lumbar extensors    Weakness noted in return to standing in flexion mobility   (Blank rows = not tested)   LUMBAR SPECIAL TESTS:  03/05/2022 (+) slump bilateral for posterior leg pulling but not concordant symptoms   FUNCTIONAL TESTS:  04/24/2022:  18 inch chair transfer:  2nd try with hands on knee  03/05/2022 Unable to stand from 18 inch chair s UE assist.    GAIT: 03/05/2022 Independent ambulation      TODAY'S TREATMENT:  04/24/2022:  There Ex:  Nustep: Level 6 x 12:30 minutes (to complete 3 laps for functional endurance) LE/UE Sit to stand from 21 inch x 5  Rows seated green band 2 x  15 Seated pball press down 5 sec hold x 10 Seated multifidi lift into table 5 sec hold x 10   Shoulder adduction stretch in sitting: x 2 each UE holding 20 sec  Upper trap stretch x 2 each side holding 20 sec    04/22/22:  There Ex:  Nustep: Level 5 x 13:27 minutes (to complete 3 laps for functional endurance) LE/UE Leg Press: 68# 2 x 15 bil LE's Mini squats using Red physio ball in lumbar curve Rows: 2 x 10 with green band, instructions to not shoulder hike Standing with Red physio ball in lumbar curve while performing AAROM shoulder flexion c  1#  bar 2 x 10  Shoulder adduction stretch in sitting: x 2 each UE holding 20 sec  Upper trap stretch x 2 each side holding 20 sec Modalities:  Moist heat to thoracic spine and lumbar spine during sitting activities/exercises   04/17/22:  There Ex:  Nustep: Level 6 x 13:45 minutes (to complete 3 laps for functional endurance) LE/UE Leg Press: 62# 3 x 10 bil LE's Sit to stand: x 10  Rows: x 20 with green band Shoulder extensions  X20 with green band Seated isometric hip flexion 5 sec X 10 bilat Seated isometric back extension 5 sec X 10 pushing into yellow ball Modalities:  E-stim: deferred today.      PATIENT EDUCATION:  03/05/2022 Education details: HEP, POC Person educated: Patient Education method: Consulting civil engineer, Demonstration, Verbal cues, and Handouts Education comprehension: verbalized understanding, returned demonstration, and verbal cues required   HOME EXERCISE PROGRAM: Access Code: [email or web address] URL: [email or web address] Date: 03/05/2022 Prepared by: Scot Jun   Exercises - Supine Lower Trunk Rotation  - 2-3 x daily - 7 x weekly - 1 sets - 3-5 reps - 15 hold - Hooklying Gluteal Sets  - 2-3 x daily - 7 x weekly - 1 sets - 10 reps - 5 hold - Seated Multifidi Isometric  - 2-3 x daily - 7 x weekly - 1 sets - 10 reps - 5-10 hold - Seated Abdominal Press into [employer]  - 2-3 x daily - 7 x weekly - 1 sets -  10 reps - 5-10 hold - Seated Scapular Retraction  - 2-3 x daily - 7 x weekly - 1 sets - 10 reps - 3-5 hold   ASSESSMENT:   CLINICAL IMPRESSION: Pt has attended 10 visits overall during course of treatment.  Mild improvements in subjectively and objectively to this point in treatment (see above).  Pt does continue to report back pain with limits in ability to perform standing/walking activity.   Incremental improvements have been noted with some functional movements improving (transfers, walking, bed mobility).  Continued skilled PT services indicated at this time to progress towards goals.     OBJECTIVE IMPAIRMENTS: Abnormal gait, decreased activity tolerance, decreased balance, decreased coordination, decreased endurance, decreased mobility, difficulty walking, decreased ROM, decreased strength, hypomobility, increased fascial restrictions, impaired perceived functional ability, increased muscle spasms,  impaired flexibility, improper body mechanics, postural dysfunction, and pain.    ACTIVITY LIMITATIONS: carrying, lifting, bending, sitting, standing, squatting, sleeping, stairs, transfers, bed mobility, bathing, toileting, dressing, reach over head, locomotion level, and caring for others   PARTICIPATION LIMITATIONS: meal prep, cleaning, laundry, interpersonal relationship, shopping, community activity, and yard work   PERSONAL FACTORS:  Arthritis, COPD, depression, GERD, length of time since onset, severity of condition, long history of surgeries for back and BLE  are also affecting patient's functional outcome.    REHAB POTENTIAL: Fair to good   CLINICAL DECISION MAKING: Evolving/moderate complexity   EVALUATION COMPLEXITY: Moderate     GOALS: Goals reviewed with patient? Yes   SHORT TERM GOALS: (target date for Short term goals are 3 weeks 03/26/2022)   1. Patient will demonstrate independent use of home exercise program to maintain progress from in clinic treatments.   Goal  status: Met   LONG TERM GOALS: (target dates for all long term goals are 10 weeks  05/14/2022 )   1. Patient will demonstrate/report pain at worst less than or equal to 2/10 to facilitate minimal limitation in daily activity secondary to pain symptoms.   Goal status: on going 04/24/2022   2. Patient will demonstrate independent use of home exercise program to facilitate ability to maintain/progress functional gains from skilled physical therapy services.   Goal status: on going 04/24/2022   3. Patient will demonstrate FOTO outcome > or = 50 % to indicate reduced disability due to condition.   Goal status: on going 04/24/2022   4. Patient will demonstrate lumbar extension 100 % WFL s symptoms to facilitate upright standing, walking posture at PLOF s limitation.   Goal status: on going 04/24/2022   5.  Patient will demonstrate sit to stand to sit from 18 inch chair s UE assist to show improved strength and mobility in daily life, reduced fall risk.   Goal status: on going 04/24/2022   6.  Patient will demonstrate/report ability to perform caregiver activity s increased symptoms.  Goal status: on going 04/24/2022     PLAN:   PT FREQUENCY: 1-2x/week   PT DURATION: 10 weeks   PLANNED INTERVENTIONS: Therapeutic exercises, Therapeutic activity, Neuro Muscular re-education, Balance training, Gait training, Patient/Family education, Joint mobilization, Stair training, DME instructions, Dry Needling, Electrical stimulation, Cryotherapy, vasopneumatic device, Moist heat, Taping, Traction Ultrasound, Ionotophoresis '4mg'$ /ml Dexamethasone, and Manual therapy.  All included unless contraindicated   PLAN FOR NEXT SESSION:  progressive postural and LE strengthening and lumbar/hip mobility gains, functional endurance and activity tolerance  Did not have time to adjust HEP to reflect recent changes due to progress note.  Plan to review next visit.     Scot Jun, PT, DPT, OCS, ATC 04/24/22  11:08  AM

## 2022-04-29 ENCOUNTER — Ambulatory Visit (INDEPENDENT_AMBULATORY_CARE_PROVIDER_SITE_OTHER): Payer: Medicare Other | Admitting: Physical Therapy

## 2022-04-29 ENCOUNTER — Encounter: Payer: Self-pay | Admitting: Physical Therapy

## 2022-04-29 DIAGNOSIS — M6281 Muscle weakness (generalized): Secondary | ICD-10-CM | POA: Diagnosis not present

## 2022-04-29 DIAGNOSIS — R262 Difficulty in walking, not elsewhere classified: Secondary | ICD-10-CM

## 2022-04-29 DIAGNOSIS — M5459 Other low back pain: Secondary | ICD-10-CM

## 2022-04-29 DIAGNOSIS — R293 Abnormal posture: Secondary | ICD-10-CM | POA: Diagnosis not present

## 2022-04-29 NOTE — Therapy (Signed)
OUTPATIENT PHYSICAL THERAPY TREATMENT NOTE /PROGRESS NOTE   Patient Name: Katrina [name] MRN: [id number] DOB:February 01, 1956, 67 y.o., female Today's Date: 04/29/2022  PCP: Leretha Pol NP  REFERRING PROVIDER: Callie Fielding, MD    END OF SESSION:   PT End of Session - 04/29/22 1113     Visit Number 11    Number of Visits 20    Date for PT Re-Evaluation 05/14/22    Authorization Type [employer] Medicare $10 copay    Progress Note Due on Visit 20    PT Start Time 1015    PT Stop Time 1057    PT Time Calculation (min) 42 min    Activity Tolerance Patient limited by pain;Patient tolerated treatment well    Behavior During Therapy [facility] for tasks assessed/performed                 Past Medical History:  Diagnosis Date   Anxiety    Arthritis    knees, hips, elbows    Asthma    hx of asthma 3/12- hospitalized    COPD (chronic obstructive pulmonary disease) ([place])    Depression    GERD (gastroesophageal reflux disease)    PRIOR TO HAVING GALLBLADDER REMOVED   Headache    from her neck   Left bundle branch block 06/28/2010   Lung infection 2015   Lung infection    Past Surgical History:  Procedure Laterality Date   BACK SURGERY     x2   CARPAL TUNNEL RELEASE     left    CESAREAN SECTION     x 2   CHOLECYSTECTOMY     COLONOSCOPY  10/2017   DILATATION & CURETTAGE/HYSTEROSCOPY WITH MYOSURE N/A 12/10/2017   Procedure: DILATATION & CURETTAGE/HYSTEROSCOPY WITH MYOSURE;  Surgeon: Christophe Louis, MD;  Location: [facility];  Service: Gynecology;  Laterality: N/A;  POSSIBLE MYOMECTOMY VS POLYPECTOMY WITH MYOSURE   DILATION AND CURETTAGE OF UTERUS     ELBOW SURGERY     JOINT REPLACEMENT     KNEE ARTHROSCOPY     bilateral x 2    KNEE ARTHROSCOPY Left 11/03/2013   Procedure: LEFT KNEE ARTHROSCOPY WITH DEBRIDEMENT, PARTIAL SYNOVECTOMY;  Surgeon: Mcarthur Rossetti, MD;  Location: [facility];  Service: Orthopedics;  Laterality: Left;   LAPAROSCOPY     left elbow surgery      OTHER  SURGICAL HISTORY     arthroswcopic surgery right knee    OTHER SURGICAL HISTORY     arthroscopic surgery left knee x 2    OTHER SURGICAL HISTORY     bunionectomy right foot    OTHER SURGICAL HISTORY     C Section x 2    OTHER SURGICAL HISTORY     carpal tunnel on left    POSTERIOR CERVICAL FUSION/FORAMINOTOMY N/A 04/02/2020   Procedure: RIGHT C5-6 FORAMINOTOMY;  Surgeon: Jessy Oto, MD;  Location: [place];  Service: Orthopedics;  Laterality: N/A;   RADIOLOGY WITH ANESTHESIA N/A 07/28/2019   Procedure: MRI WITH ANESTHESIA OF NECK SOFT TISSUE ONLY WITH AND WITHOUT CONTRAST;  Surgeon: Radiologist, Medication, MD;  Location: [place];  Service: Radiology;  Laterality: N/A;   RADIOLOGY WITH ANESTHESIA N/A 10/04/2019   Procedure: MRI WITH ANESTHESIA RIGHT SHOULDER WITHOUT CONTRAST,MRI OF CERVICAL SPINE WITHOUT CONTRAST;  Surgeon: Radiologist, Medication, MD;  Location: [place];  Service: Radiology;  Laterality: N/A;   RADIOLOGY WITH ANESTHESIA N/A 08/30/2020   Procedure: MRI WITH ANESTHESIA CERVICAL SPINE WITHOUT CONTRAST;  Surgeon: Radiologist, Medication, MD;  Location: [place];  Service: Radiology;  Laterality: N/A;   right foot bunionectomy      RIGHT/LEFT HEART CATH AND CORONARY ANGIOGRAPHY N/A 04/26/2021   Procedure: RIGHT/LEFT HEART CATH AND CORONARY ANGIOGRAPHY;  Surgeon: Early Osmond, MD;  Location: [place] [facility];  Service: Cardiovascular;  Laterality: N/A;   ROBOTIC ASSISTED TOTAL HYSTERECTOMY WITH BILATERAL SALPINGO OOPHERECTOMY Bilateral 06/16/2018   Procedure: XI ROBOTIC ASSISTED TOTAL HYSTERECTOMY WITH RIGHT SALPINGO OOPHORECTOMY;  Surgeon: Christophe Louis, MD;  Location: [place];  Service: Gynecology;  Laterality: Bilateral;   TOTAL HIP ARTHROPLASTY  05/02/2011   Procedure: TOTAL HIP ARTHROPLASTY ANTERIOR APPROACH;  Surgeon: Mcarthur Rossetti, MD;  Location: [facility];  Service: Orthopedics;  Laterality: Left;  Left Total Hip Arthroplasty, Direct Anterior Approach    TOTAL KNEE ARTHROPLASTY Right 12/03/2012   Procedure: RIGHT TOTAL KNEE ARTHROPLASTY;  Surgeon: Mcarthur Rossetti, MD;  Location: [facility];  Service: Orthopedics;  Laterality: Right;   TOTAL KNEE ARTHROPLASTY Left 12/15/2014   Procedure: LEFT TOTAL KNEE ARTHROPLASTY;  Surgeon: Mcarthur Rossetti, MD;  Location: [facility];  Service: Orthopedics;  Laterality: Left;   TUBAL LIGATION     ULNAR NERVE TRANSPOSITION     left    UPPER GI ENDOSCOPY     x 2   Patient Active Problem List   Diagnosis Date Noted   Chronic systolic heart failure ([place]) 05/21/2021   Leukocytosis 03/07/2021   Mixed hyperlipidemia 03/07/2021   Left bundle branch block 02/20/2021   Left axis deviation 02/20/2021   Acquired hallux varus of left foot 02/20/2021   Porokeratosis 12/31/2020   Sesamoiditis 12/31/2020   Hav (hallux abducto valgus), left 12/31/2020   Encounter to establish care 12/16/2020   Cigarette smoker motivated to quit 12/16/2020   Callus of foot 12/16/2020   Body mass index (BMI) of 34.0-34.9 in adult 12/16/2020   Other spondylosis with radiculopathy, cervical region 04/02/2020    Class: Chronic   Pain in finger of left hand 02/24/2020   Lung nodules 11/30/2019   Neck pain 08/04/2019   Acute pain of right shoulder 04/27/2019   Neck mass 04/27/2019   Postmenopausal bleeding 06/16/2018   S/P hysterectomy 06/16/2018   Chronic right shoulder pain 10/28/2016   Impingement syndrome of right shoulder 10/28/2016   Osteoarthritis of left knee 12/15/2014   Status post total left knee replacement 12/15/2014   Hand pain, left 04/21/2014   Cat bite of finger 04/21/2014   Asthma 04/21/2014   Tobacco abuse 04/21/2014   COPD (chronic obstructive pulmonary disease) ([place])    Effusion of left knee joint 11/03/2013   Arthritis of right knee 12/03/2012   Degenerative arthritis of hip 05/02/2011    REFERRING DIAG:  M54.50,G89.29 (ICD-10-CM) - Chronic midline low back pain without sciatica   THERAPY DIAG:   Other low back pain  Muscle weakness (generalized)  Difficulty in walking, not elsewhere classified  Abnormal posture  Rationale for Evaluation and Treatment Rehabilitation  PERTINENT HISTORY: History of 2 lumbar decompression surgeries (at least 20 years since).  Lt THA, bilateral TKA, Arthritis, COPD, depression, GERD, see above list   PRECAUTIONS: none  SUBJECTIVE:  SUBJECTIVE STATEMENT:   Pt stating pain today was 3-4/10 in her low back. Pt stating she has been working on her sit to stand using no UE support at home.     PAIN:  NPRS scale: 4/10, 6/10 at worst in last week.  Pain location: lower spine Pain description: sharp Aggravating factors: standing, bending Relieving factors: changing positions  OBJECTIVE: (objective measures completed at initial evaluation unless otherwise dated)  PATIENT SURVEYS:  04/24/2022:  FOTO update:  45  03/05/2022 FOTO eval: 43   predicted:  50   SCREENING FOR RED FLAGS: 03/05/2022 Bowel or bladder incontinence: No Cauda equina syndrome: No   COGNITION: 03/05/2022 Overall cognitive status: WFL normal                                      SENSATION: 03/05/2022 No specific testing today   MUSCLE LENGTH: 03/05/2022 No specific testing today   POSTURE: 03/05/2022 Mild forward trunk lean, forward head posture.  Reduced lumbar lordosis in standing   PALPATION: 03/05/2022 Light touch tenderness throughout lower thoracic and lumbar region grossly   LUMBAR ROM:    AROM 03/05/2022 03/27/2022 04/24/2022  Flexion Movement to mid shin c relief noted in end range.  Aberrant movement with gower's sign upon return c pain in movement to neutral Movement to mid shin c relief noted in end range.  Aberrant movement with gower's sign upon return c pain in movement  to neutral Relief into flexion, movement to mid shin.  Didn't require hands to return  Extension To neutral.  Pt was reluctant to perform lumbar extension due to pain 25% WFL with pain in back noted 25 -50 % c tightness  Right lateral flexion Movement to mid thigh c back pain Movement to mid thigh c back pain Movement to lateral femoral epicondyle c pain  Left lateral flexion Movement to mid thigh c back pain Movement to mid thigh c back pain Movement to lateral femoral epicondyle c pain  Right rotation      Left rotation       (Blank rows = not tested)   LOWER EXTREMITY ROM:        Right 03/05/2022 Left 03/05/2022  Hip flexion      Hip extension      Hip abduction      Hip adduction      Hip internal rotation      Hip external rotation      Knee flexion      Knee extension      Ankle dorsiflexion      Ankle plantarflexion      Ankle inversion      Ankle eversion       (Blank rows = not tested)   LOWER EXTREMITY MMT:     MMT Right 03/05/2022 Left 03/05/2022  Hip flexion 5/5 5/5  Hip extension      Hip abduction      Hip adduction      Hip internal rotation      Hip external rotation      Knee flexion 5/5 5/5  Knee extension 5/5 5/5  Ankle dorsiflexion 5/5 5/5  Ankle plantarflexion             Lumbar extensors    Weakness noted in return to standing in flexion mobility   (Blank rows = not tested)   LUMBAR SPECIAL TESTS:  03/05/2022 (+) slump bilateral  for posterior leg pulling but not concordant symptoms   FUNCTIONAL TESTS:  04/24/2022:  18 inch chair transfer:  2nd try with hands on knee  03/05/2022 Unable to stand from 18 inch chair s UE assist.    GAIT: 03/05/2022 Independent ambulation      TODAY'S TREATMENT:  04/29/22:  There Ex:  Nustep: Level 5 x 11:41  minutes (to complete 2 laps for functional endurance) LE/UE Leg Press: 75# 2 x 15 bil LE's Rows: Level 3 band: 2 x 15 Door stretch: x 2 holding 3 seconds QL stretch using green physio-ball  rolling to each side in seated position Seated ball press down with bil UE's holding 5 sec x 10  Seated hip flexion c abdominal activation and controlled movements   04/24/2022:  There Ex:  Nustep: Level 6 x 12:30 minutes (to complete 3 laps for functional endurance) LE/UE Sit to stand from 21 inch x 5  Rows seated green band 2 x 15 Seated pball press down 5 sec hold x 10 Seated multifidi lift into table 5 sec hold x 10   Shoulder adduction stretch in sitting: x 2 each UE holding 20 sec  Upper trap stretch x 2 each side holding 20 sec    04/22/22:  There Ex:  Nustep: Level 5 x 13:27 minutes (to complete 3 laps for functional endurance) LE/UE Leg Press: 68# 2 x 15 bil LE's Mini squats using Red physio ball in lumbar curve Rows: 2 x 10 with green band, instructions to not shoulder hike Standing with Red physio ball in lumbar curve while performing AAROM shoulder flexion c  1#  bar 2 x 10  Shoulder adduction stretch in sitting: x 2 each UE holding 20 sec  Upper trap stretch x 2 each side holding 20 sec Modalities:  Moist heat to thoracic spine and lumbar spine during sitting activities/exercises        PATIENT EDUCATION:  03/05/2022 Education details: HEP, POC Person educated: Patient Education method: Consulting civil engineer, Media planner, Verbal cues, and Handouts Education comprehension: verbalized understanding, returned demonstration, and verbal cues required   HOME EXERCISE PROGRAM: Access Code: [email or web address] URL: [email or web address] Date: 04/29/2022 Prepared by: Kearney Hard  Exercises - Supine Lower Trunk Rotation  - 2-3 x daily - 7 x weekly - 1 sets - 3-5 reps - 15 hold - Hooklying Gluteal Sets  - 2-3 x daily - 7 x weekly - 1 sets - 10 reps - 5 hold - Seated Multifidi Isometric  - 2-3 x daily - 7 x weekly - 1 sets - 10 reps - 5-10 hold - Seated Abdominal Press into [employer]  - 2-3 x daily - 7 x weekly - 1 sets - 10 reps - 5-10 hold - Seated Scapular  Retraction  - 2-3 x daily - 7 x weekly - 1 sets - 10 reps - 3-5 hold - Seated Gentle Upper Trapezius Stretch  - 1-2 x daily - 7 x weekly - 3 reps - 10 seconds hold - Seated Chin Tuck with Neck Elongation  - 1-2 x daily - 7 x weekly - 3 reps - 10 seconds hold - Doorway Pec Stretch at 90 Degrees Abduction  - 1-2 x daily - 7 x weekly - 3 reps - 10 seconds hold - Standing Shoulder Posterior Capsule Stretch  - 1-2 x daily - 7 x weekly - 3 reps - 10 seconds hold   ASSESSMENT:   CLINICAL IMPRESSION: Pt still reporting pain in her low back of 3-5/10 at times more  with prolonged standing. Pt stating her walking shorter distances has improved. Pt tolerating exercises in clinic today with no reports of increased pain. Focusing on core strengthening and LE strengthening. Pt's HEP was updated. Pt will need further review next session.  Pt also stating our next visit may be her last due to finances. Continue skilled interventions to progress toward LTG's.     OBJECTIVE IMPAIRMENTS: Abnormal gait, decreased activity tolerance, decreased balance, decreased coordination, decreased endurance, decreased mobility, difficulty walking, decreased ROM, decreased strength, hypomobility, increased fascial restrictions, impaired perceived functional ability, increased muscle spasms, impaired flexibility, improper body mechanics, postural dysfunction, and pain.    ACTIVITY LIMITATIONS: carrying, lifting, bending, sitting, standing, squatting, sleeping, stairs, transfers, bed mobility, bathing, toileting, dressing, reach over head, locomotion level, and caring for others   PARTICIPATION LIMITATIONS: meal prep, cleaning, laundry, interpersonal relationship, shopping, community activity, and yard work   PERSONAL FACTORS:  Arthritis, COPD, depression, GERD, length of time since onset, severity of condition, long history of surgeries for back and BLE  are also affecting patient's functional outcome.    REHAB POTENTIAL: Fair to  good   CLINICAL DECISION MAKING: Evolving/moderate complexity   EVALUATION COMPLEXITY: Moderate     GOALS: Goals reviewed with patient? Yes   SHORT TERM GOALS: (target date for Short term goals are 3 weeks 03/26/2022)   1. Patient will demonstrate independent use of home exercise program to maintain progress from in clinic treatments.   Goal status: Met   LONG TERM GOALS: (target dates for all long term goals are 10 weeks  05/14/2022 )   1. Patient will demonstrate/report pain at worst less than or equal to 2/10 to facilitate minimal limitation in daily activity secondary to pain symptoms.   Goal status: on going 04/29/2022   2. Patient will demonstrate independent use of home exercise program to facilitate ability to maintain/progress functional gains from skilled physical therapy services.   Goal status: on going 04/24/2022   3. Patient will demonstrate FOTO outcome > or = 50 % to indicate reduced disability due to condition.   Goal status: on going 04/24/2022   4. Patient will demonstrate lumbar extension 100 % WFL s symptoms to facilitate upright standing, walking posture at PLOF s limitation.   Goal status: on going 04/24/2022   5.  Patient will demonstrate sit to stand to sit from 18 inch chair s UE assist to show improved strength and mobility in daily life, reduced fall risk.   Goal status: on going 04/24/2022   6.  Patient will demonstrate/report ability to perform caregiver activity s increased symptoms.  Goal status: on going 04/24/2022     PLAN:   PT FREQUENCY: 1-2x/week   PT DURATION: 10 weeks   PLANNED INTERVENTIONS: Therapeutic exercises, Therapeutic activity, Neuro Muscular re-education, Balance training, Gait training, Patient/Family education, Joint mobilization, Stair training, DME instructions, Dry Needling, Electrical stimulation, Cryotherapy, vasopneumatic device, Moist heat, Taping, Traction Ultrasound, Ionotophoresis '4mg'$ /ml Dexamethasone, and Manual  therapy.  All included unless contraindicated   PLAN FOR NEXT SESSION:  - Review final HEP, FOTO - progressive postural and LE strengthening and lumbar/hip mobility gains, functional endurance and activity tolerance     Kearney Hard, PT, MPT 04/29/22 11:27 AM   04/29/22  11:27 AM

## 2022-04-29 NOTE — Progress Notes (Signed)
Cardiology Office Note:    Date:  05/02/2022   ID:  Katrina [name], DOB 10-Aug-1955, MRN [id number]  PCP:  Ronnell Freshwater, NP   [facility] Providers Cardiologist:  Lenna Sciara, MD Referring MD: Ronnell Freshwater, NP   Chief Complaint/Reason for Referral:  Cardiology follow up  ASSESSMENT:    1. NICM (nonischemic cardiomyopathy) ([place])   2. Aortic atherosclerosis ([place])   3. Hyperlipidemia LDL goal <70   4. Left bundle branch block   5. Tobacco abuse   6. BMI 35.0-35.9,adult   7. CKD (chronic kidney disease) stage 2, GFR 60-89 ml/min       PLAN:    In order of problems listed above: 1.  Nonischemic cardiomyopathy: She unfortunately has been off [place] and Entresto for a year due to financial issues.  For this reason I will start losartan 50 mg daily.  We will try to see if we can get her Jardiance 25 mg.  She is euvolemic on exam.  Will obtain an echocardiogram in 3 months time. 2.  Aortic atherosclerosis:  Continue aspirin, statin, and strict blood pressure control. 3.  Hyperlipidemia: Check lipid panel, LFTs, LP(a) today. ADDENDUM:  Lipids above goal despite max dose atorvastatin>>will have Pharmacy address at next appointment.  4.  Left bundle branch block:  Monitor for now.  If ejection fraction remains under 35% will refer to EP to consider primary prevention ICD. 5.  Tobacco abuse: Carotid and abdominal ultrasounds were reassuring 6.  Elevated BMI: We will refer to pharmacy for recommendations regarding pharmacotherapy. 7.  Chronic kidney disease: Start losartan as detailed above; check BMP in one week.           Dispo:  Return in about 6 months (around 10/31/2022).      Medication Adjustments/Labs and Tests Ordered: Current medicines are reviewed at length with the Katrina [name] today.  Concerns regarding medicines are outlined above.  The following changes have been made:     Labs/tests ordered: Orders Placed This Encounter  Procedures   Basic  Metabolic Panel (BMET)   AMB Referral to [facility]   ECHOCARDIOGRAM COMPLETE    Medication Changes: Meds ordered this encounter  Medications   losartan (COZAAR) 50 MG tablet    Sig: Take 1 tablet (50 mg total) by mouth daily.    Dispense:  90 tablet    Refill:  3    Stop Entresto     Current medicines are reviewed at length with the Katrina [name] today.  The Katrina [name] does not have concerns regarding medicines.   History of Present Illness:    FOCUSED PROBLEM LIST:   1.  Nonischemic cardiomyopathy with EF 35-40% with cardiac catheterization January 2023 demonstrating no obstructive coronary artery disease with cardiac output of 8.3 L/min and index of 3.9 L/min/m 2.  Aortic atherosclerosis seen on CT scan September 2022 3.  Left bundle branch block 4.  COPD 5.  Tobacco abuse 6.  BMI of 35 7.  CKD stage II  The Katrina [name] is a Katrina [name] with the indicated medical history here for routine follow-up.    December 2022: Seen for preoperative evaluation.  Due to left bundle branch block an echocardiogram was obtained which showed a new cardiomyopathy.  She is referred for coronary angiography which showed no obstructive coronary artery disease.  January 2023: Seen in follow-up.  She started on Toprol, losartan, spironolactone, and Jardiance.  April 2023: An echocardiogram done earlier this month demonstrated ejection fraction of 35 to 40%.  There is no significant valvular abnormalities.  The Katrina [name] underwent uncomplicated surgery with podiatry.  Katrina [name] tells me her breathing is fairly good.  Because of her issues with her ankle she has not been walking around as much and is gained a little bit of weight.  She occasionally gets lightheaded when going from sitting to standing.  She denies any chest pain.  She is tolerating her medications without any issues.  She continues to smoke and is trying to quit.  She is trying to use Nicorette gum.  Her husband unfortunately was hospitalized  for quite some time and this really increased her stress level.  Plan: Increase Jardiance to 25 mg, Cozaar to 50 mg check lipid panel LFTs carotid ultrasound abdominal ultrasound for screening purposes given longstanding tobacco abuse.  October 2023:: Carotid and abdominal ultrasound demonstrated no significant abnormalities.  Her atorvastatin was increased to 80 given above goal LDL.  The Katrina [name] tells me that she has had occasional presyncope and palpitations at times.  This happens maybe once or twice a week.  Last time it happened was about a week ago.  She denies any exertional chest pain, peripheral edema, but does have problems lying flat.  She feels uncomfortable in terms of breathing.  She denies any signs or symptoms of stroke, or severe bleeding though she has some nuisance bruising while on aspirin.  She has required no emergency room visits or hospitalizations.  She is unable to afford her Vania Rea and therefore has not been taking it.  She unfortunately continues to smoke.  Plan: Stop losartan and start Entresto, increase Toprol to 100 mg, obtain monitor due to presyncope and check lipid panel, and LFTs day of next visit.  Today: The Katrina [name] is doing well from a cardiovascular standpoint.  She denies any significant shortness of breath, peripheral edema, or paroxysmal nocturnal dyspnea.  She unfortunately has been not taking her Jardiance or Entresto for about a year.  She fortunately not required any emergency room visits or hospitalizations.  She still smoking when her life stressors are exacerbated but things seem to be relatively calm for now.  She has been compliant with the medications that she is able to receive.  She is working on her core in terms of strengthening because she may need back surgery in the future.       Current Medications: Current Meds  Medication Sig   acetaminophen (TYLENOL) 500 MG tablet Take 1,000 mg by mouth every 6 (six) hours as needed for moderate pain.    aspirin EC 81 MG tablet Take 1 tablet (81 mg total) by mouth daily. Swallow whole.   atorvastatin (LIPITOR) 80 MG tablet Take 1 tablet (80 mg total) by mouth daily.   Budeson-Glycopyrrol-Formoterol (BREZTRI AEROSPHERE) 160-9-4.8 MCG/ACT AERO Inhale 2 puffs into the lungs in the morning and at bedtime.   losartan (COZAAR) 50 MG tablet Take 1 tablet (50 mg total) by mouth daily.   metoprolol succinate (TOPROL-XL) 100 MG 24 hr tablet Take 1 tablet (100 mg total) by mouth daily. Take with or immediately following a meal.   spironolactone (ALDACTONE) 25 MG tablet Take 1 tablet (25 mg total) by mouth at bedtime.     Allergies:    Codeine   Social History:   Social History   Tobacco Use   Smoking status: Every Day    Packs/day: 1.00    Years: 25.00    Total pack years: 25.00    Types: Cigarettes   Smokeless tobacco: Never  Vaping Use   Vaping Use: Never used  Substance Use Topics   Alcohol use: Yes    Alcohol/week: 49.0 standard drinks of alcohol    Types: 49 Cans of beer per week   Drug use: No    Comment: hx of 34 years ago marijuana      Family Hx: Family History  Problem Relation Age of Onset   Heart disease Mother    High Cholesterol Mother    High blood pressure Mother    Depression Brother    Diabetes Brother      Review of Systems:   Please see the history of present illness.    All other systems reviewed and are negative.     EKGs/Labs/Other Test Reviewed:    EKG: None today  Prior CV studies:  Echocardiogram September 2023:  1. Left ventricular ejection fraction, by estimation, is 35 to 40%. The  left ventricle has moderately decreased function. The left ventricle  demonstrates global hypokinesis. The left ventricular internal cavity size  was mildly dilated. There is mild  left ventricular hypertrophy. Left ventricular diastolic parameters are  indeterminate.   2. Right ventricular systolic function is normal. The right ventricular  size is normal.  Tricuspid regurgitation signal is inadequate for assessing  PA pressure.   3. Left atrial size was moderately dilated.   4. The mitral valve is normal in structure. Trivial mitral valve  regurgitation. No evidence of mitral stenosis.   5. The aortic valve is grossly normal. Aortic valve regurgitation is not  visualized. No aortic stenosis is present.   6. The inferior vena cava is normal in size with greater than 50%  respiratory variability, suggesting right atrial pressure of 3 mmHg.   Abdominal ultrasound 2023: No abdominal aortic aneurysm  Carotid ultrasound 2023: Mild disease bilaterally  Echocardiogram April 2023 with ejection fraction of 35 to 40% with mild concentric left ventricular hypertrophy and no significant valvular abnormalities  Coronary angiogram and right heart catheterization 2023: 1.  Normal right dominant coronary circulation. 2.  Cardiac output of 8.3 L/min and cardiac index of 3.97 L/min/m with mean RA pressure 4 mmHg and mean wedge pressure of 18 mmHg.   Recommendations continue goal-directed medical therapy for nonischemic cardiomyopathy as well as aspirin and atorvastatin for aortic atherosclerosis.  Other studies Reviewed: Review of the additional studies/records demonstrates: CT scan September 2022 demonstrating aortic atherosclerosis  Recent Labs: 07/30/2021: BUN 14; Creatinine, Ser 0.97; Potassium 4.3; Sodium 139 01/08/2022: ALT 18   Recent Lipid Panel Lab Results  Component Value Date/Time   CHOL 177 01/08/2022 09:46 AM   TRIG 229 (H) 01/08/2022 09:46 AM   HDL 50 01/08/2022 09:46 AM   LDLCALC 89 01/08/2022 09:46 AM    Risk Assessment/Calculations:          Physical Exam:    VS:  BP 118/68   Pulse 75   Ht '5\' 7"'$  (1.702 m)   Wt 236 lb 6.4 oz (107.2 kg)   SpO2 97%   BMI 37.03 kg/m          Wt Readings from Last 3 Encounters:  05/02/22 236 lb 6.4 oz (107.2 kg)  02/20/22 227 lb (103 kg)  01/15/22 227 lb (103 kg)    GENERAL:  No  apparent distress, AOx3 HEENT:  No carotid bruits, +2 carotid impulses, no scleral icterus CAR: RRR no murmurs, gallops, rubs, or thrills RES:  Clear to auscultation bilaterally ABD:  Soft, nontender, nondistended, positive bowel sounds x 4 VASC:  +  2 radial pulses, +2 carotid pulses, palpable pedal pulses NEURO:  CN 2-12 grossly intact; motor and sensory grossly intact PSYCH:  No active depression or anxiety EXT:  No edema, ecchymosis, or cyanosis  Signed, Early Osmond, MD  05/02/2022 10:54 AM    [address], [place], [place]  [address] Phone: [phone]; Fax: [phone]   Note:  This document was prepared using Dragon voice recognition software and may include unintentional dictation errors.

## 2022-05-01 ENCOUNTER — Encounter: Payer: Self-pay | Admitting: Physical Therapy

## 2022-05-01 ENCOUNTER — Ambulatory Visit: Payer: Medicare Other | Admitting: Physical Therapy

## 2022-05-01 DIAGNOSIS — R293 Abnormal posture: Secondary | ICD-10-CM

## 2022-05-01 DIAGNOSIS — M5459 Other low back pain: Secondary | ICD-10-CM

## 2022-05-01 DIAGNOSIS — M6281 Muscle weakness (generalized): Secondary | ICD-10-CM

## 2022-05-01 DIAGNOSIS — R262 Difficulty in walking, not elsewhere classified: Secondary | ICD-10-CM

## 2022-05-01 NOTE — Therapy (Signed)
OUTPATIENT PHYSICAL THERAPY TREATMENT NOTE/Discharge  PHYSICAL THERAPY DISCHARGE SUMMARY  Visits from Start of Care: 12  Current functional level related to goals / functional outcomes: See below   Remaining deficits: See below   Education / Equipment: HEP  Plan:  Patient goals were all but 2 met. Patient is being discharged due financial reasons     Patient Name: Katrina [name] MRN: [id number] DOB:07/20/1955, 67 y.o., female Today's Date: 05/01/2022  PCP: Leretha Pol NP  REFERRING PROVIDER: Callie Fielding, MD    END OF SESSION:   PT End of Session - 05/01/22 1022     Visit Number 12    Number of Visits 20    Date for PT Re-Evaluation 05/14/22    Authorization Type [employer] Medicare $10 copay    Progress Note Due on Visit 20    PT Start Time 1015    PT Stop Time 1055    PT Time Calculation (min) 40 min    Activity Tolerance Patient limited by pain;Patient tolerated treatment well    Behavior During Therapy [facility] for tasks assessed/performed                  Past Medical History:  Diagnosis Date   Anxiety    Arthritis    knees, hips, elbows    Asthma    hx of asthma 3/12- hospitalized    COPD (chronic obstructive pulmonary disease) ([place])    Depression    GERD (gastroesophageal reflux disease)    PRIOR TO HAVING GALLBLADDER REMOVED   Headache    from her neck   Left bundle branch block 06/28/2010   Lung infection 2015   Lung infection    Past Surgical History:  Procedure Laterality Date   BACK SURGERY     x2   CARPAL TUNNEL RELEASE     left    CESAREAN SECTION     x 2   CHOLECYSTECTOMY     COLONOSCOPY  10/2017   DILATATION & CURETTAGE/HYSTEROSCOPY WITH MYOSURE N/A 12/10/2017   Procedure: DILATATION & CURETTAGE/HYSTEROSCOPY WITH MYOSURE;  Surgeon: Christophe Louis, MD;  Location: [facility];  Service: Gynecology;  Laterality: N/A;  POSSIBLE MYOMECTOMY VS POLYPECTOMY WITH MYOSURE   DILATION AND CURETTAGE OF UTERUS     ELBOW SURGERY     JOINT  REPLACEMENT     KNEE ARTHROSCOPY     bilateral x 2    KNEE ARTHROSCOPY Left 11/03/2013   Procedure: LEFT KNEE ARTHROSCOPY WITH DEBRIDEMENT, PARTIAL SYNOVECTOMY;  Surgeon: Mcarthur Rossetti, MD;  Location: [facility];  Service: Orthopedics;  Laterality: Left;   LAPAROSCOPY     left elbow surgery      OTHER SURGICAL HISTORY     arthroswcopic surgery right knee    OTHER SURGICAL HISTORY     arthroscopic surgery left knee x 2    OTHER SURGICAL HISTORY     bunionectomy right foot    OTHER SURGICAL HISTORY     C Section x 2    OTHER SURGICAL HISTORY     carpal tunnel on left    POSTERIOR CERVICAL FUSION/FORAMINOTOMY N/A 04/02/2020   Procedure: RIGHT C5-6 FORAMINOTOMY;  Surgeon: Jessy Oto, MD;  Location: [place];  Service: Orthopedics;  Laterality: N/A;   RADIOLOGY WITH ANESTHESIA N/A 07/28/2019   Procedure: MRI WITH ANESTHESIA OF NECK SOFT TISSUE ONLY WITH AND WITHOUT CONTRAST;  Surgeon: Radiologist, Medication, MD;  Location: [place];  Service: Radiology;  Laterality: N/A;   RADIOLOGY WITH ANESTHESIA N/A  10/04/2019   Procedure: MRI WITH ANESTHESIA RIGHT SHOULDER WITHOUT CONTRAST,MRI OF CERVICAL SPINE WITHOUT CONTRAST;  Surgeon: Radiologist, Medication, MD;  Location: [place];  Service: Radiology;  Laterality: N/A;   RADIOLOGY WITH ANESTHESIA N/A 08/30/2020   Procedure: MRI WITH ANESTHESIA CERVICAL SPINE WITHOUT CONTRAST;  Surgeon: Radiologist, Medication, MD;  Location: [place];  Service: Radiology;  Laterality: N/A;   right foot bunionectomy      RIGHT/LEFT HEART CATH AND CORONARY ANGIOGRAPHY N/A 04/26/2021   Procedure: RIGHT/LEFT HEART CATH AND CORONARY ANGIOGRAPHY;  Surgeon: Early Osmond, MD;  Location: [place] [facility];  Service: Cardiovascular;  Laterality: N/A;   ROBOTIC ASSISTED TOTAL HYSTERECTOMY WITH BILATERAL SALPINGO OOPHERECTOMY Bilateral 06/16/2018   Procedure: XI ROBOTIC ASSISTED TOTAL HYSTERECTOMY WITH RIGHT SALPINGO OOPHORECTOMY;  Surgeon: Christophe Louis, MD;  Location: [place];  Service: Gynecology;  Laterality: Bilateral;   TOTAL HIP ARTHROPLASTY  05/02/2011   Procedure: TOTAL HIP ARTHROPLASTY ANTERIOR APPROACH;  Surgeon: Mcarthur Rossetti, MD;  Location: [facility];  Service: Orthopedics;  Laterality: Left;  Left Total Hip Arthroplasty, Direct Anterior Approach   TOTAL KNEE ARTHROPLASTY Right 12/03/2012   Procedure: RIGHT TOTAL KNEE ARTHROPLASTY;  Surgeon: Mcarthur Rossetti, MD;  Location: [facility];  Service: Orthopedics;  Laterality: Right;   TOTAL KNEE ARTHROPLASTY Left 12/15/2014   Procedure: LEFT TOTAL KNEE ARTHROPLASTY;  Surgeon: Mcarthur Rossetti, MD;  Location: [facility];  Service: Orthopedics;  Laterality: Left;   TUBAL LIGATION     ULNAR NERVE TRANSPOSITION     left    UPPER GI ENDOSCOPY     x 2   Patient Active Problem List   Diagnosis Date Noted   Chronic systolic heart failure ([place]) 05/21/2021   Leukocytosis 03/07/2021   Mixed hyperlipidemia 03/07/2021   Left bundle branch block 02/20/2021   Left axis deviation 02/20/2021   Acquired hallux varus of left foot 02/20/2021   Porokeratosis 12/31/2020   Sesamoiditis 12/31/2020   Hav (hallux abducto valgus), left 12/31/2020   Encounter to establish care 12/16/2020   Cigarette smoker motivated to quit 12/16/2020   Callus of foot 12/16/2020   Body mass index (BMI) of 34.0-34.9 in adult 12/16/2020   Other spondylosis with radiculopathy, cervical region 04/02/2020    Class: Chronic   Pain in finger of left hand 02/24/2020   Lung nodules 11/30/2019   Neck pain 08/04/2019   Acute pain of right shoulder 04/27/2019   Neck mass 04/27/2019   Postmenopausal bleeding 06/16/2018   S/P hysterectomy 06/16/2018   Chronic right shoulder pain 10/28/2016   Impingement syndrome of right shoulder 10/28/2016   Osteoarthritis of left knee 12/15/2014   Status post total left knee replacement 12/15/2014   Hand pain, left 04/21/2014   Cat bite of finger 04/21/2014   Asthma 04/21/2014   Tobacco  abuse 04/21/2014   COPD (chronic obstructive pulmonary disease) ([place])    Effusion of left knee joint 11/03/2013   Arthritis of right knee 12/03/2012   Degenerative arthritis of hip 05/02/2011    REFERRING DIAG:  M54.50,G89.29 (ICD-10-CM) - Chronic midline low back pain without sciatica   THERAPY DIAG:  Other low back pain  Muscle weakness (generalized)  Difficulty in walking, not elsewhere classified  Abnormal posture  Rationale for Evaluation and Treatment Rehabilitation  PERTINENT HISTORY: History of 2 lumbar decompression surgeries (at least 20 years since).  Lt THA, bilateral TKA, Arthritis, COPD, depression, GERD, see above list   PRECAUTIONS: none  SUBJECTIVE:  SUBJECTIVE STATEMENT:   Pt stating this will have to be her last visit due to finances.    PAIN:  NPRS scale: 5.5/10  Pain location: lower spine Pain description: sharp Aggravating factors: standing, bending Relieving factors: changing positions  OBJECTIVE: (objective measures completed at initial evaluation unless otherwise dated)  PATIENT SURVEYS:   05/01/22: FOTO 53% and met goal 04/24/2022:  FOTO update:  45  03/05/2022 FOTO eval: 43   predicted:  50     SCREENING FOR RED FLAGS: 03/05/2022 Bowel or bladder incontinence: No Cauda equina syndrome: No   COGNITION: 03/05/2022 Overall cognitive status: WFL normal                                      SENSATION: 03/05/2022 No specific testing today   MUSCLE LENGTH: 03/05/2022 No specific testing today   POSTURE: 03/05/2022 Mild forward trunk lean, forward head posture.  Reduced lumbar lordosis in standing   PALPATION: 03/05/2022 Light touch tenderness throughout lower thoracic and lumbar region grossly   LUMBAR ROM:    AROM 03/05/2022 03/27/2022 04/24/2022  05/01/22  Flexion Movement to mid shin c relief noted in end range.  Aberrant movement with gower's sign upon return c pain in movement to neutral Movement to mid shin c relief noted in end range.  Aberrant movement with gower's sign upon return c pain in movement to neutral Relief into flexion, movement to mid shin.  Didn't require hands to return 75%  Extension To neutral.  Pt was reluctant to perform lumbar extension due to pain 25% WFL with pain in back noted 25 -50 % c tightness 50%  Right lateral flexion Movement to mid thigh c back pain Movement to mid thigh c back pain Movement to lateral femoral epicondyle c pain 50%  Left lateral flexion Movement to mid thigh c back pain Movement to mid thigh c back pain Movement to lateral femoral epicondyle c pain 75%  Right rotation       Left rotation        (Blank rows = not tested)   LOWER EXTREMITY ROM:        Right 03/05/2022 Left 03/05/2022  Hip flexion      Hip extension      Hip abduction      Hip adduction      Hip internal rotation      Hip external rotation      Knee flexion      Knee extension      Ankle dorsiflexion      Ankle plantarflexion      Ankle inversion      Ankle eversion       (Blank rows = not tested)   LOWER EXTREMITY MMT:     MMT Right 03/05/2022 Left 03/05/2022  Hip flexion 5/5 5/5  Hip extension      Hip abduction      Hip adduction      Hip internal rotation      Hip external rotation      Knee flexion 5/5 5/5  Knee extension 5/5 5/5  Ankle dorsiflexion 5/5 5/5  Ankle plantarflexion             Lumbar extensors    Weakness noted in return to standing in flexion mobility   (Blank rows = not tested)   LUMBAR SPECIAL TESTS:  03/05/2022 (+) slump bilateral for posterior leg pulling but  not concordant symptoms   FUNCTIONAL TESTS:  04/24/2022:  18 inch chair transfer:  2nd try with hands on knee  03/05/2022 Unable to stand from 18 inch chair s UE assist.    GAIT: 03/05/2022 Independent  ambulation      TODAY'S TREATMENT:  05/01/22:  There Ex:  Nustep: Level 5 x 10:20  minutes (to complete 2 laps for functional endurance) LE/UE Supine LTR 15 sec X 3 bilat Supine glute sets 5 sec X 10 Supine bridges X 10 Sit to stand no UE support X 10 Seated multifidi isometric at table 5 sec X 10 Seated ab set isometric shoulder extension X 10 Rows: Level 3 band: 2 x 15 Door stretch: x 2 holding 3 seconds Updated measurments, FOTO, goals for DC    PATIENT EDUCATION:  03/05/2022 Education details: HEP, POC Person educated: Patient Education method: Consulting civil engineer, Media planner, Verbal cues, and Handouts Education comprehension: verbalized understanding, returned demonstration, and verbal cues required   HOME EXERCISE PROGRAM: Access Code: [email or web address] URL: [email or web address] Date: 04/29/2022 Prepared by: Kearney Hard  Exercises - Supine Lower Trunk Rotation  - 2-3 x daily - 7 x weekly - 1 sets - 3-5 reps - 15 hold - Hooklying Gluteal Sets  - 2-3 x daily - 7 x weekly - 1 sets - 10 reps - 5 hold - Seated Multifidi Isometric  - 2-3 x daily - 7 x weekly - 1 sets - 10 reps - 5-10 hold - Seated Abdominal Press into [employer]  - 2-3 x daily - 7 x weekly - 1 sets - 10 reps - 5-10 hold - Seated Scapular Retraction  - 2-3 x daily - 7 x weekly - 1 sets - 10 reps - 3-5 hold - Seated Gentle Upper Trapezius Stretch  - 1-2 x daily - 7 x weekly - 3 reps - 10 seconds hold - Seated Chin Tuck with Neck Elongation  - 1-2 x daily - 7 x weekly - 3 reps - 10 seconds hold - Doorway Pec Stretch at 90 Degrees Abduction  - 1-2 x daily - 7 x weekly - 3 reps - 10 seconds hold - Standing Shoulder Posterior Capsule Stretch  - 1-2 x daily - 7 x weekly - 3 reps - 10 seconds hold   ASSESSMENT:   CLINICAL IMPRESSION: Pt will be discharged today to independent program at her request today due to finances. We reviewed her HEP and she shows good overall understanding.  She met all but 2 of of  her PT goals.     OBJECTIVE IMPAIRMENTS: Abnormal gait, decreased activity tolerance, decreased balance, decreased coordination, decreased endurance, decreased mobility, difficulty walking, decreased ROM, decreased strength, hypomobility, increased fascial restrictions, impaired perceived functional ability, increased muscle spasms, impaired flexibility, improper body mechanics, postural dysfunction, and pain.    ACTIVITY LIMITATIONS: carrying, lifting, bending, sitting, standing, squatting, sleeping, stairs, transfers, bed mobility, bathing, toileting, dressing, reach over head, locomotion level, and caring for others   PARTICIPATION LIMITATIONS: meal prep, cleaning, laundry, interpersonal relationship, shopping, community activity, and yard work   PERSONAL FACTORS:  Arthritis, COPD, depression, GERD, length of time since onset, severity of condition, long history of surgeries for back and BLE  are also affecting patient's functional outcome.    REHAB POTENTIAL: Fair to good   CLINICAL DECISION MAKING: Evolving/moderate complexity   EVALUATION COMPLEXITY: Moderate     GOALS: Goals reviewed with patient? Yes   SHORT TERM GOALS: (target date for Short term goals are 3 weeks 03/26/2022)   1.  Patient will demonstrate independent use of home exercise program to maintain progress from in clinic treatments.   Goal status: Met   LONG TERM GOALS: (target dates for all long term goals are 10 weeks  05/14/2022 )   1. Patient will demonstrate/report pain at worst less than or equal to 2/10 to facilitate minimal limitation in daily activity secondary to pain symptoms.   Goal status: on going 05/01/2022   2. Patient will demonstrate independent use of home exercise program to facilitate ability to maintain/progress functional gains from skilled physical therapy services.   Goal status: MET 05/01/22   3. Patient will demonstrate FOTO outcome > or = 50 % to indicate reduced disability due to  condition.   Goal status: on going 04/24/2022   4. Patient will demonstrate lumbar extension 100 % WFL s symptoms to facilitate upright standing, walking posture at PLOF s limitation.   Goal status: on going MET 05/01/22   5.  Patient will demonstrate sit to stand to sit from 18 inch chair s UE assist to show improved strength and mobility in daily life, reduced fall risk.   Goal status: on going MET 05/02/23   6.  Patient will demonstrate/report ability to perform caregiver activity s increased symptoms.  Goal status: on going 05/01/2022     PLAN:   PT FREQUENCY: 1-2x/week   PT DURATION: 10 weeks   PLANNED INTERVENTIONS: Therapeutic exercises, Therapeutic activity, Neuro Muscular re-education, Balance training, Gait training, Patient/Family education, Joint mobilization, Stair training, DME instructions, Dry Needling, Electrical stimulation, Cryotherapy, vasopneumatic device, Moist heat, Taping, Traction Ultrasound, Ionotophoresis '4mg'$ /ml Dexamethasone, and Manual therapy.  All included unless contraindicated   PLAN FOR NEXT SESSION:  - discharge to HEP today.   Elsie Ra, PT, DPT 05/01/22 10:55 AM

## 2022-05-02 ENCOUNTER — Encounter: Payer: Self-pay | Admitting: Internal Medicine

## 2022-05-02 ENCOUNTER — Encounter: Payer: Self-pay | Admitting: Physical Medicine & Rehabilitation

## 2022-05-02 ENCOUNTER — Ambulatory Visit: Payer: Medicare Other | Attending: Internal Medicine

## 2022-05-02 ENCOUNTER — Ambulatory Visit: Payer: Medicare Other | Attending: Internal Medicine | Admitting: Internal Medicine

## 2022-05-02 ENCOUNTER — Encounter: Payer: Medicare Other | Attending: Physical Medicine & Rehabilitation | Admitting: Physical Medicine & Rehabilitation

## 2022-05-02 VITALS — BP 110/74 | HR 85 | Ht 67.0 in | Wt 236.0 lb

## 2022-05-02 VITALS — BP 118/68 | HR 75 | Ht 67.0 in | Wt 236.4 lb

## 2022-05-02 DIAGNOSIS — I428 Other cardiomyopathies: Secondary | ICD-10-CM

## 2022-05-02 DIAGNOSIS — M545 Low back pain, unspecified: Secondary | ICD-10-CM | POA: Diagnosis not present

## 2022-05-02 DIAGNOSIS — F39 Unspecified mood [affective] disorder: Secondary | ICD-10-CM

## 2022-05-02 DIAGNOSIS — Z6835 Body mass index (BMI) 35.0-35.9, adult: Secondary | ICD-10-CM

## 2022-05-02 DIAGNOSIS — Z720 Tobacco use: Secondary | ICD-10-CM

## 2022-05-02 DIAGNOSIS — I447 Left bundle-branch block, unspecified: Secondary | ICD-10-CM

## 2022-05-02 DIAGNOSIS — I700 Atherosclerosis of aorta: Secondary | ICD-10-CM | POA: Diagnosis not present

## 2022-05-02 DIAGNOSIS — N182 Chronic kidney disease, stage 2 (mild): Secondary | ICD-10-CM

## 2022-05-02 DIAGNOSIS — G8929 Other chronic pain: Secondary | ICD-10-CM

## 2022-05-02 DIAGNOSIS — E785 Hyperlipidemia, unspecified: Secondary | ICD-10-CM | POA: Diagnosis not present

## 2022-05-02 MED ORDER — LOSARTAN POTASSIUM 50 MG PO TABS: 50.0000 mg | ORAL_TABLET | Freq: Every day | ORAL | 3 refills | Status: DC

## 2022-05-02 MED ORDER — DULOXETINE HCL 20 MG PO CPEP: 20.0000 mg | ORAL_CAPSULE | Freq: Every day | ORAL | 2 refills | Status: DC

## 2022-05-02 NOTE — Patient Instructions (Signed)
Please call your insurance company to find out: 1.) do you have a deductible each year for medications? 2.) how much will Jardiance cost after a deductible (monthly)  Is Wilder Glade the same or is it more or less than Jardiance?  Medication Instructions:  Discontinue Entresto Start losartan 50 mg - one tablet daily We will try to work on getting Jardiance at an affordable cost   *If you need a refill on your cardiac medications before your next appointment, please call your pharmacy*   Lab Work: As planned today Please return in about 1-2 weeks for blood work Artist)   Testing/Procedures: ECHO DUE EARLY MAY Your physician has requested that you have an echocardiogram. Echocardiography is a painless test that uses sound waves to create images of your heart. It provides your doctor with information about the size and shape of your heart and how well your heart's chambers and valves are working. This procedure takes approximately one hour. There are no restrictions for this procedure. Please do NOT wear cologne, perfume, aftershave, or lotions (deodorant is allowed). Please arrive 15 minutes prior to your appointment time.   Follow-Up: At [facility], you and your health needs are our priority.  As part of our continuing mission to provide you with exceptional heart care, we have created designated Provider Care Teams.  These Care Teams include your primary Cardiologist (physician) and Advanced Practice Providers (APPs -  Physician Assistants and Nurse Practitioners) who all work together to provide you with the care you need, when you need it.   Your next appointment:   6 month(s)  Provider:   Early Osmond, MD    You have been referred to our Clinical Pharmacy Team for elevated BMI/weight management.
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
## 2022-05-03 LAB — LIPID PANEL
Chol/HDL Ratio: 3.6 ratio (ref 0.0–4.4)
Cholesterol, Total: 197 mg/dL (ref 100–199)
HDL: 55 mg/dL (ref >39)
LDL Chol Calc (NIH): 105 mg/dL — ABNORMAL HIGH (ref 0–99)
Triglycerides: 215 mg/dL — ABNORMAL HIGH (ref 0–149)
VLDL Cholesterol Cal: 37 mg/dL (ref 5–40)

## 2022-05-03 LAB — LIPOPROTEIN A (LPA): Lipoprotein (a): <8.4 nmol/L (ref <75.0)

## 2022-05-12 ENCOUNTER — Ambulatory Visit: Payer: Medicare Other | Attending: Internal Medicine

## 2022-05-12 DIAGNOSIS — I428 Other cardiomyopathies: Secondary | ICD-10-CM | POA: Diagnosis not present

## 2022-05-13 ENCOUNTER — Telehealth: Payer: Self-pay

## 2022-05-13 LAB — BASIC METABOLIC PANEL
BUN/Creatinine Ratio: 15 (ref 12–28)
BUN: 14 mg/dL (ref 8–27)
CO2: 21 mmol/L (ref 20–29)
Calcium: 9.4 mg/dL (ref 8.7–10.3)
Chloride: 104 mmol/L (ref 96–106)
Creatinine, Ser: 0.92 mg/dL (ref 0.57–1.00)
Glucose: 112 mg/dL — ABNORMAL HIGH (ref 70–99)
Potassium: 4.2 mmol/L (ref 3.5–5.2)
Sodium: 138 mmol/L (ref 134–144)
eGFR: 69 mL/min/{1.73_m2} (ref >59)

## 2022-05-13 NOTE — Telephone Encounter (Signed)
**Note De-Identified Sian Rockers Obfuscation** [facility] needs a new providers page of the pts application for [facility] assistance as the one we faxed to them on 01/27/2022 was unreadable.  I have completed another providers page of a [facility] application with our DOD, Dr Karel Jarvis information and I have e-mailed it along with a copy of the pts med list to Dr Karel Jarvis nurse so she can obtain his signature, date it, and to fax all to [facility] at the fax number written on the cover letter.  I did add a note on the cover letter asking [facility] to add the providers page with the pts application that we faxed to them in 01/2022.

## 2022-05-14 NOTE — Telephone Encounter (Signed)
Patient assistance forms signed and faxed 05/13/2022.

## 2022-05-23 ENCOUNTER — Encounter: Payer: Medicare Other | Admitting: Physical Medicine & Rehabilitation

## 2022-05-26 ENCOUNTER — Other Ambulatory Visit: Payer: Self-pay

## 2022-05-26 MED ORDER — SPIRONOLACTONE 25 MG PO TABS: 25.0000 mg | ORAL_TABLET | Freq: Every day | ORAL | 3 refills | Status: DC

## 2022-05-30 ENCOUNTER — Encounter: Payer: Medicare Other | Admitting: Physical Medicine & Rehabilitation

## 2022-05-30 ENCOUNTER — Encounter: Payer: Self-pay | Admitting: Physical Medicine & Rehabilitation

## 2022-05-30 VITALS — BP 97/61 | HR 81 | Ht 67.0 in | Wt 234.0 lb

## 2022-05-30 DIAGNOSIS — G894 Chronic pain syndrome: Secondary | ICD-10-CM | POA: Diagnosis not present

## 2022-05-30 DIAGNOSIS — Z5181 Encounter for therapeutic drug level monitoring: Secondary | ICD-10-CM | POA: Diagnosis not present

## 2022-05-30 DIAGNOSIS — E669 Obesity, unspecified: Secondary | ICD-10-CM | POA: Diagnosis not present

## 2022-05-30 DIAGNOSIS — M545 Low back pain, unspecified: Secondary | ICD-10-CM | POA: Diagnosis not present

## 2022-05-30 DIAGNOSIS — F1010 Alcohol abuse, uncomplicated: Secondary | ICD-10-CM | POA: Diagnosis not present

## 2022-05-30 DIAGNOSIS — F39 Unspecified mood [affective] disorder: Secondary | ICD-10-CM | POA: Insufficient documentation

## 2022-05-30 DIAGNOSIS — J9602 Acute respiratory failure with hypercapnia: Secondary | ICD-10-CM | POA: Diagnosis not present

## 2022-05-30 DIAGNOSIS — I10 Essential (primary) hypertension: Secondary | ICD-10-CM | POA: Diagnosis not present

## 2022-05-30 DIAGNOSIS — J439 Emphysema, unspecified: Secondary | ICD-10-CM | POA: Diagnosis not present

## 2022-05-30 DIAGNOSIS — R252 Cramp and spasm: Secondary | ICD-10-CM

## 2022-05-30 DIAGNOSIS — Z96653 Presence of artificial knee joint, bilateral: Secondary | ICD-10-CM | POA: Diagnosis not present

## 2022-05-30 DIAGNOSIS — M160 Bilateral primary osteoarthritis of hip: Secondary | ICD-10-CM | POA: Diagnosis not present

## 2022-05-30 DIAGNOSIS — R6510 Systemic inflammatory response syndrome (SIRS) of non-infectious origin without acute organ dysfunction: Secondary | ICD-10-CM | POA: Diagnosis not present

## 2022-05-30 DIAGNOSIS — Z1152 Encounter for screening for COVID-19: Secondary | ICD-10-CM | POA: Diagnosis not present

## 2022-05-30 DIAGNOSIS — J309 Allergic rhinitis, unspecified: Secondary | ICD-10-CM | POA: Diagnosis not present

## 2022-05-30 DIAGNOSIS — F419 Anxiety disorder, unspecified: Secondary | ICD-10-CM | POA: Diagnosis not present

## 2022-05-30 DIAGNOSIS — E782 Mixed hyperlipidemia: Secondary | ICD-10-CM | POA: Diagnosis not present

## 2022-05-30 DIAGNOSIS — F32A Depression, unspecified: Secondary | ICD-10-CM | POA: Diagnosis not present

## 2022-05-30 DIAGNOSIS — R079 Chest pain, unspecified: Secondary | ICD-10-CM | POA: Diagnosis not present

## 2022-05-30 DIAGNOSIS — M170 Bilateral primary osteoarthritis of knee: Secondary | ICD-10-CM | POA: Diagnosis not present

## 2022-05-30 DIAGNOSIS — R110 Nausea: Secondary | ICD-10-CM | POA: Diagnosis not present

## 2022-05-30 DIAGNOSIS — R7989 Other specified abnormal findings of blood chemistry: Secondary | ICD-10-CM | POA: Diagnosis not present

## 2022-05-30 DIAGNOSIS — J441 Chronic obstructive pulmonary disease with (acute) exacerbation: Secondary | ICD-10-CM | POA: Diagnosis not present

## 2022-05-30 DIAGNOSIS — J9601 Acute respiratory failure with hypoxia: Secondary | ICD-10-CM | POA: Diagnosis not present

## 2022-05-30 DIAGNOSIS — E785 Hyperlipidemia, unspecified: Secondary | ICD-10-CM | POA: Diagnosis not present

## 2022-05-30 DIAGNOSIS — F17210 Nicotine dependence, cigarettes, uncomplicated: Secondary | ICD-10-CM | POA: Diagnosis not present

## 2022-05-30 DIAGNOSIS — Z79891 Long term (current) use of opiate analgesic: Secondary | ICD-10-CM | POA: Diagnosis not present

## 2022-05-30 DIAGNOSIS — R0789 Other chest pain: Secondary | ICD-10-CM | POA: Diagnosis not present

## 2022-05-30 DIAGNOSIS — G8929 Other chronic pain: Secondary | ICD-10-CM | POA: Insufficient documentation

## 2022-05-30 DIAGNOSIS — M19022 Primary osteoarthritis, left elbow: Secondary | ICD-10-CM | POA: Diagnosis not present

## 2022-05-30 DIAGNOSIS — R1013 Epigastric pain: Secondary | ICD-10-CM | POA: Diagnosis not present

## 2022-05-30 DIAGNOSIS — I447 Left bundle-branch block, unspecified: Secondary | ICD-10-CM | POA: Diagnosis not present

## 2022-05-30 DIAGNOSIS — M19021 Primary osteoarthritis, right elbow: Secondary | ICD-10-CM | POA: Diagnosis not present

## 2022-05-30 DIAGNOSIS — I5022 Chronic systolic (congestive) heart failure: Secondary | ICD-10-CM | POA: Diagnosis not present

## 2022-05-30 DIAGNOSIS — Z720 Tobacco use: Secondary | ICD-10-CM | POA: Diagnosis not present

## 2022-05-30 DIAGNOSIS — R17 Unspecified jaundice: Secondary | ICD-10-CM | POA: Diagnosis not present

## 2022-05-30 DIAGNOSIS — E875 Hyperkalemia: Secondary | ICD-10-CM | POA: Diagnosis not present
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
## 2022-05-31 ENCOUNTER — Inpatient Hospital Stay (HOSPITAL_COMMUNITY)
Admission: EM | Admit: 2022-05-31 | Discharge: 2022-06-03 | DRG: 190 | Disposition: A | Discharge status: Home Health | Payer: Medicare Other | Attending: Internal Medicine | Admitting: Internal Medicine

## 2022-05-31 ENCOUNTER — Encounter (HOSPITAL_COMMUNITY): Payer: Self-pay

## 2022-05-31 ENCOUNTER — Emergency Department (HOSPITAL_COMMUNITY): Payer: Medicare Other

## 2022-05-31 ENCOUNTER — Other Ambulatory Visit: Payer: Self-pay

## 2022-05-31 DIAGNOSIS — R079 Chest pain, unspecified: Principal | ICD-10-CM

## 2022-05-31 DIAGNOSIS — M160 Bilateral primary osteoarthritis of hip: Secondary | ICD-10-CM | POA: Diagnosis present

## 2022-05-31 DIAGNOSIS — F17210 Nicotine dependence, cigarettes, uncomplicated: Secondary | ICD-10-CM | POA: Diagnosis present

## 2022-05-31 DIAGNOSIS — J9602 Acute respiratory failure with hypercapnia: Secondary | ICD-10-CM | POA: Diagnosis present

## 2022-05-31 DIAGNOSIS — Z9049 Acquired absence of other specified parts of digestive tract: Secondary | ICD-10-CM

## 2022-05-31 DIAGNOSIS — M19022 Primary osteoarthritis, left elbow: Secondary | ICD-10-CM | POA: Diagnosis present

## 2022-05-31 DIAGNOSIS — F1010 Alcohol abuse, uncomplicated: Secondary | ICD-10-CM | POA: Diagnosis present

## 2022-05-31 DIAGNOSIS — E782 Mixed hyperlipidemia: Secondary | ICD-10-CM | POA: Diagnosis not present

## 2022-05-31 DIAGNOSIS — J309 Allergic rhinitis, unspecified: Secondary | ICD-10-CM | POA: Diagnosis present

## 2022-05-31 DIAGNOSIS — I5022 Chronic systolic (congestive) heart failure: Secondary | ICD-10-CM | POA: Diagnosis present

## 2022-05-31 DIAGNOSIS — Z1152 Encounter for screening for COVID-19: Secondary | ICD-10-CM | POA: Diagnosis not present

## 2022-05-31 DIAGNOSIS — J439 Emphysema, unspecified: Secondary | ICD-10-CM | POA: Diagnosis present

## 2022-05-31 DIAGNOSIS — R110 Nausea: Secondary | ICD-10-CM | POA: Diagnosis present

## 2022-05-31 DIAGNOSIS — Z720 Tobacco use: Secondary | ICD-10-CM | POA: Diagnosis not present

## 2022-05-31 DIAGNOSIS — E875 Hyperkalemia: Secondary | ICD-10-CM | POA: Diagnosis present

## 2022-05-31 DIAGNOSIS — I447 Left bundle-branch block, unspecified: Secondary | ICD-10-CM | POA: Diagnosis present

## 2022-05-31 DIAGNOSIS — E785 Hyperlipidemia, unspecified: Secondary | ICD-10-CM | POA: Diagnosis present

## 2022-05-31 DIAGNOSIS — Z818 Family history of other mental and behavioral disorders: Secondary | ICD-10-CM

## 2022-05-31 DIAGNOSIS — Z96653 Presence of artificial knee joint, bilateral: Secondary | ICD-10-CM | POA: Diagnosis present

## 2022-05-31 DIAGNOSIS — K219 Gastro-esophageal reflux disease without esophagitis: Secondary | ICD-10-CM | POA: Diagnosis present

## 2022-05-31 DIAGNOSIS — R252 Cramp and spasm: Secondary | ICD-10-CM | POA: Diagnosis present

## 2022-05-31 DIAGNOSIS — R17 Unspecified jaundice: Secondary | ICD-10-CM | POA: Diagnosis present

## 2022-05-31 DIAGNOSIS — Z83438 Family history of other disorder of lipoprotein metabolism and other lipidemia: Secondary | ICD-10-CM

## 2022-05-31 DIAGNOSIS — R7989 Other specified abnormal findings of blood chemistry: Secondary | ICD-10-CM | POA: Diagnosis not present

## 2022-05-31 DIAGNOSIS — Z9851 Tubal ligation status: Secondary | ICD-10-CM

## 2022-05-31 DIAGNOSIS — E669 Obesity, unspecified: Secondary | ICD-10-CM | POA: Diagnosis present

## 2022-05-31 DIAGNOSIS — J441 Chronic obstructive pulmonary disease with (acute) exacerbation: Principal | ICD-10-CM | POA: Diagnosis present

## 2022-05-31 DIAGNOSIS — J9601 Acute respiratory failure with hypoxia: Secondary | ICD-10-CM | POA: Diagnosis present

## 2022-05-31 DIAGNOSIS — R6510 Systemic inflammatory response syndrome (SIRS) of non-infectious origin without acute organ dysfunction: Secondary | ICD-10-CM | POA: Diagnosis present

## 2022-05-31 DIAGNOSIS — F32A Depression, unspecified: Secondary | ICD-10-CM | POA: Diagnosis present

## 2022-05-31 DIAGNOSIS — Z7951 Long term (current) use of inhaled steroids: Secondary | ICD-10-CM

## 2022-05-31 DIAGNOSIS — M19021 Primary osteoarthritis, right elbow: Secondary | ICD-10-CM | POA: Diagnosis present

## 2022-05-31 DIAGNOSIS — Z7984 Long term (current) use of oral hypoglycemic drugs: Secondary | ICD-10-CM

## 2022-05-31 DIAGNOSIS — M170 Bilateral primary osteoarthritis of knee: Secondary | ICD-10-CM | POA: Diagnosis present

## 2022-05-31 DIAGNOSIS — Z6836 Body mass index (BMI) 36.0-36.9, adult: Secondary | ICD-10-CM

## 2022-05-31 DIAGNOSIS — F419 Anxiety disorder, unspecified: Secondary | ICD-10-CM | POA: Diagnosis present

## 2022-05-31 DIAGNOSIS — Z8249 Family history of ischemic heart disease and other diseases of the circulatory system: Secondary | ICD-10-CM

## 2022-05-31 DIAGNOSIS — Z79899 Other long term (current) drug therapy: Secondary | ICD-10-CM

## 2022-05-31 DIAGNOSIS — M545 Low back pain, unspecified: Secondary | ICD-10-CM | POA: Diagnosis present

## 2022-05-31 DIAGNOSIS — R1013 Epigastric pain: Secondary | ICD-10-CM | POA: Diagnosis present

## 2022-05-31 DIAGNOSIS — Z885 Allergy status to narcotic agent status: Secondary | ICD-10-CM

## 2022-05-31 DIAGNOSIS — Z7982 Long term (current) use of aspirin: Secondary | ICD-10-CM

## 2022-05-31 DIAGNOSIS — Z96642 Presence of left artificial hip joint: Secondary | ICD-10-CM | POA: Diagnosis present

## 2022-05-31 DIAGNOSIS — Z981 Arthrodesis status: Secondary | ICD-10-CM

## 2022-05-31 DIAGNOSIS — I5020 Unspecified systolic (congestive) heart failure: Secondary | ICD-10-CM | POA: Diagnosis present

## 2022-05-31 DIAGNOSIS — Z90710 Acquired absence of both cervix and uterus: Secondary | ICD-10-CM

## 2022-05-31 HISTORY — DX: Chronic systolic (congestive) heart failure: I50.22

## 2022-05-31 HISTORY — DX: Alcohol abuse, uncomplicated: F10.10

## 2022-05-31 HISTORY — DX: Tobacco use: Z72.0

## 2022-05-31 LAB — CBC WITH DIFFERENTIAL/PLATELET
Abs Immature Granulocytes: 0.05 10*3/uL (ref 0.00–0.07)
Basophils Absolute: 0.1 10*3/uL (ref 0.0–0.1)
Basophils Relative: 1 %
Eosinophils Absolute: 0.4 10*3/uL (ref 0.0–0.5)
Eosinophils Relative: 3 %
HCT: 42.4 % (ref 36.0–46.0)
Hemoglobin: 14.3 g/dL (ref 12.0–15.0)
Immature Granulocytes: 0 %
Lymphocytes Relative: 25 %
Lymphs Abs: 3.3 10*3/uL (ref 0.7–4.0)
MCH: 32.5 pg (ref 26.0–34.0)
MCHC: 33.7 g/dL (ref 30.0–36.0)
MCV: 96.4 fL (ref 80.0–100.0)
Monocytes Absolute: 1.2 10*3/uL — ABNORMAL HIGH (ref 0.1–1.0)
Monocytes Relative: 9 %
Neutro Abs: 7.9 10*3/uL — ABNORMAL HIGH (ref 1.7–7.7)
Neutrophils Relative %: 62 %
Platelets: 264 10*3/uL (ref 150–400)
RBC: 4.4 MIL/uL (ref 3.87–5.11)
RDW: 13 % (ref 11.5–15.5)
WBC: 12.8 10*3/uL — ABNORMAL HIGH (ref 4.0–10.5)
nRBC: 0 % (ref 0.0–0.2)

## 2022-05-31 LAB — RESP PANEL BY RT-PCR (RSV, FLU A&B, COVID)  RVPGX2
Influenza A by PCR: NEGATIVE
Influenza B by PCR: NEGATIVE
Resp Syncytial Virus by PCR: NEGATIVE
SARS Coronavirus 2 by RT PCR: NEGATIVE

## 2022-05-31 LAB — COMPREHENSIVE METABOLIC PANEL
ALT: 25 U/L (ref 0–44)
AST: 29 U/L (ref 15–41)
Albumin: 3.5 g/dL (ref 3.5–5.0)
Alkaline Phosphatase: 132 U/L — ABNORMAL HIGH (ref 38–126)
Anion gap: 14 (ref 5–15)
BUN: 12 mg/dL (ref 8–23)
CO2: 16 mmol/L — ABNORMAL LOW (ref 22–32)
Calcium: 8.6 mg/dL — ABNORMAL LOW (ref 8.9–10.3)
Chloride: 102 mmol/L (ref 98–111)
Creatinine, Ser: 0.98 mg/dL (ref 0.44–1.00)
GFR, Estimated: >60 mL/min (ref >60)
Glucose, Bld: 88 mg/dL (ref 70–99)
Potassium: 5.3 mmol/L — ABNORMAL HIGH (ref 3.5–5.1)
Sodium: 132 mmol/L — ABNORMAL LOW (ref 135–145)
Total Bilirubin: 1.9 mg/dL — ABNORMAL HIGH (ref 0.3–1.2)
Total Protein: 6.9 g/dL (ref 6.5–8.1)

## 2022-05-31 LAB — LIPASE, BLOOD: Lipase: 96 U/L — ABNORMAL HIGH (ref 11–51)

## 2022-05-31 LAB — TROPONIN I (HIGH SENSITIVITY)
Troponin I (High Sensitivity): 15 ng/L (ref <18)
Troponin I (High Sensitivity): 21 ng/L — ABNORMAL HIGH (ref <18)

## 2022-05-31 LAB — D-DIMER, QUANTITATIVE: D-Dimer, Quant: 0.68 ug/mL-FEU — ABNORMAL HIGH (ref 0.00–0.50)

## 2022-05-31 MED ORDER — ACETAMINOPHEN 325 MG PO TABS
650.0000 mg | ORAL_TABLET | Freq: Four times a day (QID) | ORAL | Status: DC | PRN
  Administered 2022-06-02 – 2022-06-03 (×2): 650 mg via ORAL
  Filled 2022-05-31 (×2): qty 2

## 2022-05-31 MED ORDER — IPRATROPIUM-ALBUTEROL 0.5-2.5 (3) MG/3ML IN SOLN
3.0000 mL | Freq: Once | RESPIRATORY_TRACT | Status: AC
  Administered 2022-05-31: 3 mL via RESPIRATORY_TRACT
  Filled 2022-05-31: qty 3

## 2022-05-31 MED ORDER — ONDANSETRON HCL 4 MG/2ML IJ SOLN
4.0000 mg | Freq: Once | INTRAMUSCULAR | Status: AC
  Administered 2022-05-31: 4 mg via INTRAVENOUS
  Filled 2022-05-31: qty 2

## 2022-05-31 MED ORDER — IOHEXOL 350 MG/ML SOLN
75.0000 mL | Freq: Once | INTRAVENOUS | Status: AC | PRN
  Administered 2022-05-31: 75 mL via INTRAVENOUS

## 2022-05-31 MED ORDER — ALBUTEROL SULFATE (2.5 MG/3ML) 0.083% IN NEBU: 2.5000 mg | INHALATION_SOLUTION | RESPIRATORY_TRACT | Status: DC | PRN

## 2022-05-31 MED ORDER — METHYLPREDNISOLONE SODIUM SUCC 125 MG IJ SOLR
125.0000 mg | Freq: Once | INTRAMUSCULAR | Status: AC
  Administered 2022-05-31: 125 mg via INTRAVENOUS
  Filled 2022-05-31: qty 2

## 2022-05-31 MED ORDER — METHYLPREDNISOLONE SODIUM SUCC 125 MG IJ SOLR
80.0000 mg | Freq: Two times a day (BID) | INTRAMUSCULAR | Status: DC
  Administered 2022-06-01: 80 mg via INTRAVENOUS
  Filled 2022-05-31: qty 2

## 2022-05-31 MED ORDER — MORPHINE SULFATE (PF) 4 MG/ML IV SOLN
4.0000 mg | Freq: Once | INTRAVENOUS | Status: AC
  Administered 2022-05-31: 4 mg via INTRAVENOUS
  Filled 2022-05-31: qty 1

## 2022-05-31 MED ORDER — ACETAMINOPHEN 650 MG RE SUPP: 650.0000 mg | Freq: Four times a day (QID) | RECTAL | Status: DC | PRN

## 2022-05-31 MED ORDER — METOCLOPRAMIDE HCL 5 MG/ML IJ SOLN
10.0000 mg | Freq: Once | INTRAMUSCULAR | Status: AC
  Administered 2022-05-31: 10 mg via INTRAVENOUS
  Filled 2022-05-31: qty 2

## 2022-05-31 MED ORDER — ONDANSETRON HCL 4 MG/2ML IJ SOLN: 4.0000 mg | Freq: Four times a day (QID) | INTRAMUSCULAR | Status: DC | PRN

## 2022-05-31 MED ORDER — MELATONIN 3 MG PO TABS
3.0000 mg | ORAL_TABLET | Freq: Every evening | ORAL | Status: DC | PRN
  Administered 2022-06-01: 3 mg via ORAL
  Filled 2022-05-31: qty 1

## 2022-05-31 MED ORDER — SPIRITUS FRUMENTI
1.0000 | Freq: Two times a day (BID) | ORAL | Status: DC
  Administered 2022-06-01 – 2022-06-02 (×2): 1 via ORAL
  Filled 2022-05-31 (×6): qty 1

## 2022-05-31 MED ORDER — IPRATROPIUM-ALBUTEROL 0.5-2.5 (3) MG/3ML IN SOLN
3.0000 mL | Freq: Four times a day (QID) | RESPIRATORY_TRACT | Status: DC
  Administered 2022-06-01 – 2022-06-03 (×9): 3 mL via RESPIRATORY_TRACT
  Filled 2022-05-31 (×9): qty 3

## 2022-05-31 MED ORDER — SODIUM CHLORIDE 0.9 % IV SOLN
500.0000 mg | INTRAVENOUS | Status: DC
  Administered 2022-05-31 – 2022-06-03 (×3): 500 mg via INTRAVENOUS
  Filled 2022-05-31 (×3): qty 5

## 2022-05-31 NOTE — ED Notes (Signed)
The pt wants her inhaler

## 2022-05-31 NOTE — ED Provider Notes (Signed)
[facility] Provider Note   CSN: [id number] Arrival date & time: 05/31/22  1630     History  Chief Complaint  Patient presents with   Chest Pain    Katrina [name] is a 67 y.o. female.  Patient is a 67 year old female with a history of COPD with ongoing tobacco use, chronic left bundle branch block, status post cholecystectomy and hysterectomy who is presenting today with sudden onset of chest pain.  Patient reports that today it seemed to be a normal day.  She does smoke cigarettes and reports she had had some beer to drink today but she went and laid down and shortly after laying down had a sudden severe pain in the epigastric area and radiated around to her left lower chest.  She reports the pain was unbearable.  It made her nauseated and she was not able to take a deep breath due to pain.  She had noticed some coughing today but denied shortness of breath or wheezing prior to this episode occurring.  She has had normal bowel movements and started Jardiance back 1 week ago but has had no other medication changes.  She denies any radiation into the back or down until the lower abdomen.  She denies history of similar symptoms like this but does report she is under cardiologist care and had a cath done 1 year ago that was completely normal.  She reports she has had several echoes that as well though have not showed any acute findings.  She has not noticed any swelling in her legs and has no prior history of blood clots.  She reports certain movements make the pain worse and still taking deep breaths.  She was given some pain medicine by EMS which she says did make the pain a little better.  The history is provided by the patient and medical records.  Chest Pain      Home Medications Prior to Admission medications   Medication Sig Start Date End Date Taking? Authorizing Provider  acetaminophen (TYLENOL) 500 MG tablet Take 1,000 mg by mouth  every 6 (six) hours as needed for moderate pain.    [provider]  aspirin EC 81 MG tablet Take 1 tablet (81 mg total) by mouth daily. Swallow whole. 03/21/21   Early Osmond, MD  atorvastatin (LIPITOR) 80 MG tablet Take 1 tablet (80 mg total) by mouth daily. 08/22/21   Early Osmond, MD  Budeson-Glycopyrrol-Formoterol (BREZTRI AEROSPHERE) 160-9-4.8 MCG/ACT AERO Inhale 2 puffs into the lungs in the morning and at bedtime. 07/01/21   June Leap L, DO  diclofenac Sodium (VOLTAREN ARTHRITIS PAIN) 1 % GEL Apply 2 g topically 4 (four) times daily.    [provider]  DULoxetine (CYMBALTA) 20 MG capsule Take 1 capsule (20 mg total) by mouth daily. 05/02/22 05/02/23  Jennye Boroughs, MD  empagliflozin (JARDIANCE) 25 MG TABS tablet Take 1 tablet (25 mg total) by mouth daily before breakfast. 07/26/21   Early Osmond, MD  losartan (COZAAR) 50 MG tablet Take 1 tablet (50 mg total) by mouth daily. 05/02/22   Early Osmond, MD  metoprolol succinate (TOPROL-XL) 100 MG 24 hr tablet Take 1 tablet (100 mg total) by mouth daily. Take with or immediately following a meal. 01/15/22   Early Osmond, MD  spironolactone (ALDACTONE) 25 MG tablet Take 1 tablet (25 mg total) by mouth at bedtime. 05/26/22   Early Osmond, MD  clonazePAM Bobbye [place]) 0.5  MG tablet Take 1 tablet (0.5 mg total) by mouth 2 (two) times daily as needed for anxiety. 08/24/19 02/22/20  Jessy Oto, MD      Allergies    Codeine    Review of Systems   Review of Systems  Cardiovascular:  Positive for chest pain.    Physical Exam Updated Vital Signs BP (!) 146/81   Pulse 86   Temp 97.9 F (36.6 C)   Resp 16   Ht '5\' 7"'$  (1.702 m)   Wt 106.1 kg   SpO2 93%   BMI 36.65 kg/m  Physical Exam Vitals and nursing note reviewed.  Constitutional:      General: She is not in acute distress.    Appearance: She is well-developed.     Comments: She appears uncomfortable and is holding the left side of her chest   HENT:     Head: Normocephalic and atraumatic.     Mouth/Throat:     Mouth: Mucous membranes are dry.  Eyes:     Pupils: Pupils are equal, round, and reactive to light.  Cardiovascular:     Rate and Rhythm: Normal rate and regular rhythm.     Pulses: Normal pulses.     Heart sounds: Normal heart sounds. No murmur heard.    No friction rub.  Pulmonary:     Effort: Pulmonary effort is normal.     Breath sounds: Normal breath sounds. No wheezing or rales.     Comments: Occasional scant expiratory wheeze but breath sounds are equal bilaterally Abdominal:     General: Bowel sounds are normal. There is no distension.     Palpations: Abdomen is soft.     Tenderness: There is no abdominal tenderness. There is no guarding or rebound.  Musculoskeletal:        General: No tenderness. Normal range of motion.     Right lower leg: No tenderness. No edema.     Left lower leg: No tenderness. No edema.     Comments: No edema  Skin:    General: Skin is warm and dry.     Findings: No rash.     Comments: No rashes present on the skin concerning for shingles  Neurological:     Mental Status: She is alert and oriented to person, place, and time. Mental status is at baseline.     Cranial Nerves: No cranial nerve deficit.  Psychiatric:        Mood and Affect: Mood normal.        Behavior: Behavior normal.     ED Results / Procedures / Treatments   Labs (all labs ordered are listed, but only abnormal results are displayed) Labs Reviewed  CBC WITH DIFFERENTIAL/PLATELET - Abnormal; Notable for the following components:      Result Value   WBC 12.8 (*)    Neutro Abs 7.9 (*)    Monocytes Absolute 1.2 (*)    All other components within normal limits  COMPREHENSIVE METABOLIC PANEL - Abnormal; Notable for the following components:   Sodium 132 (*)    Potassium 5.3 (*)    CO2 16 (*)    Calcium 8.6 (*)    Alkaline Phosphatase 132 (*)    Total Bilirubin 1.9 (*)    All other components within  normal limits  LIPASE, BLOOD - Abnormal; Notable for the following components:   Lipase 96 (*)    All other components within normal limits  D-DIMER, QUANTITATIVE - Abnormal; Notable for the following components:  D-Dimer, Quant 0.68 (*)    All other components within normal limits  TROPONIN I (HIGH SENSITIVITY) - Abnormal; Notable for the following components:   Troponin I (High Sensitivity) 21 (*)    All other components within normal limits  RESP PANEL BY RT-PCR (RSV, FLU A&B, COVID)  [id number]  TROPONIN I (HIGH SENSITIVITY)  TROPONIN I (HIGH SENSITIVITY)    EKG EKG Interpretation  Date/Time:  Saturday May 31 2022 16:41:06 EST Ventricular Rate:  99 PR Interval:  177 QRS Duration: 147 QT Interval:  383 QTC Calculation: 492 R Axis:   -58 Text Interpretation: Sinus tachycardia Ventricular trigeminy Left bundle branch block No significant change since last tracing Confirmed by Blanchie Dessert [phone]) on 05/31/2022 4:45:10 PM  Radiology CT Angio Chest PE W and/or Wo Contrast  Result Date: 05/31/2022 CLINICAL DATA:  Pulmonary embolism (PE) suspected, low to intermediate prob, positive D-dimer EXAM: CT ANGIOGRAPHY CHEST WITH CONTRAST TECHNIQUE: Multidetector CT imaging of the chest was performed using the standard protocol during bolus administration of intravenous contrast. Multiplanar CT image reconstructions and MIPs were obtained to evaluate the vascular anatomy. RADIATION DOSE REDUCTION: This exam was performed according to the departmental dose-optimization program which includes automated exposure control, adjustment of the mA and/or kV according to patient size and/or use of iterative reconstruction technique. CONTRAST:  16m OMNIPAQUE IOHEXOL 350 MG/ML SOLN COMPARISON:  06/12/2021 FINDINGS: Cardiovascular: Adequate opacification of the pulmonary arterial tree. No intraluminal filling defect identified to suggest acute pulmonary embolism. Central pulmonary arteries are of normal  caliber. No significant coronary artery calcification. Cardiac size within normal limits. No pericardial effusion. Mild atherosclerotic calcification within the thoracic aorta. No aortic aneurysm. Mediastinum/Nodes: No enlarged mediastinal, hilar, or axillary lymph nodes. Thyroid gland, trachea, and esophagus demonstrate no significant findings. Lungs/Pleura: Mild emphysema. Known right lower lobe ground-glass pulmonary nodule is partially obscured by discoid atelectasis at axial image # 90/6. No new focal pulmonary nodules or infiltrates. No pneumothorax or pleural effusion. Bronchial wall thickening is present in keeping with airway inflammation. No central obstructing lesion. Upper Abdomen: Status post cholecystectomy.  No acute abnormality. Musculoskeletal: Osseous structures are age-appropriate. No acute bone abnormality. No lytic or blastic bone lesion. Review of the MIP images confirms the above findings. IMPRESSION: 1. No pulmonary embolism. No acute intrathoracic pathology identified. 2. Mild emphysema. 3. Known right lower lobe ground-glass pulmonary nodule is partially obscured by discoid atelectasis on today's examination. Re-evaluation with outpatient routine lung cancer screening CT examination is recommended for continued observation. 4. Bronchial wall thickening in keeping with airway inflammation. No central obstructing lesion. Emphysema (ICD10-J43.9). Electronically Signed   By: AFidela SalisburyM.D.   On: 05/31/2022 21:59   DG Chest Port 1 View  Result Date: 05/31/2022 CLINICAL DATA:  Left-sided chest and epigastric pain. EXAM: PORTABLE CHEST 1 VIEW COMPARISON:  Chest CT 06/12/2021 FINDINGS: The cardiomediastinal contours are normal. Hyperinflation and bronchial thickening. Pulmonary vasculature is normal. No consolidation, pleural effusion, or pneumothorax. Spurring at the right first rib costochondral junction. No acute osseous abnormalities are seen. IMPRESSION: Hyperinflation and bronchial  thickening, likely related to known emphysema. No acute findings. Electronically Signed   By: MKeith RakeM.D.   On: 05/31/2022 17:30    Procedures Procedures    Medications Ordered in ED Medications  methylPREDNISolone sodium succinate (SOLU-MEDROL) 125 mg/2 mL injection 125 mg (has no administration in time range)  metoCLOPramide (REGLAN) injection 10 mg (has no administration in time range)  ipratropium-albuterol (DUONEB) 0.5-2.5 (3) MG/3ML nebulizer solution 3 mL (  has no administration in time range)  morphine (PF) 4 MG/ML injection 4 mg (4 mg Intravenous Given 05/31/22 1725)  ondansetron (ZOFRAN) injection 4 mg (4 mg Intravenous Given 05/31/22 1725)  morphine (PF) 4 MG/ML injection 4 mg (4 mg Intravenous Given 05/31/22 2035)  metoCLOPramide (REGLAN) injection 10 mg (10 mg Intravenous Given 05/31/22 2028)  ipratropium-albuterol (DUONEB) 0.5-2.5 (3) MG/3ML nebulizer solution 3 mL (3 mLs Nebulization Given 05/31/22 2025)  iohexol (OMNIPAQUE) 350 MG/ML injection 75 mL (75 mLs Intravenous Contrast Given 05/31/22 2142)    ED Course/ Medical Decision Making/ A&P                             Medical Decision Making Amount and/or Complexity of Data Reviewed External Data Reviewed: notes.    Details: cardiologist Labs: ordered. Decision-making details documented in ED Course. Radiology: ordered and independent interpretation performed. Decision-making details documented in ED Course. ECG/medicine tests: ordered and independent interpretation performed. Decision-making details documented in ED Course.  Risk Prescription drug management. Decision regarding hospitalization.   Pt with multiple medical problems and comorbidities and presenting today with a complaint that caries a high risk for morbidity and mortality.  Here today with sudden onset of severe pleuritic type chest pain.  Patient has had a normal catheterization 1 year ago despite history of diabetes, hypertension, heart failure  and ongoing tobacco use.  Concern for PE, spontaneous pneumothorax, GI cause, dissection.  Patient does have equal pulses throughout a normal blood pressure making dissection a little less likely.  She does have risk factors for PE given her age and tobacco use history.  She has not however had unilateral leg pain or swelling.  Also possibility for pleurisy given her ongoing smoking history or lung cancer.  Patient is not having significant wheezing at this time that requires inhalers.  She was given pain control.  I independently interpreted patient's EKG and labs.  EKG showed a sinus tachycardia with a left bundle branch block which is unchanged from prior EKGs.  CBC with leukocytosis of 12 today but normal hemoglobin and platelet count, troponin initially 15 and delta was 21, CMP with normal renal function and anion gap, lipase mildly elevated at 96 today and D-dimer mildly elevated 0.68. I have independently visualized and interpreted pt's images today.  Chest x-ray without evidence of pneumothorax or pneumonia at this time.  No findings for pulmonary edema. 10:34 PM On repeat evaluation patient initially was a little bit better after pain medication but now is starting to have pain again.  Now she is diffusely wheezing and is requiring 2 L to keep sats greater than 90% which she has been on since arrival.  CTA is pending but patient given additional pain medication and a DuoNeb.  10:34 PM On reevaluation patient is now nauseated and still having some wheezing.  CTA negative for PE but does have some bronchial thickening but no evidence of pneumonia.  Given patient's mild bump in troponin feel it is most likely related to strain from breathing.  Suspect most likely COPD because of her pain.  Patient given antiemetics, Solu-Medrol.  Will give a second DuoNeb.  Also patient is still requiring 2 L of oxygen.  Findings discussed with the patient.  At this time she is agreeable for admission.  Hospitalist  consulted for admission.        Final Clinical Impression(s) / ED Diagnoses Final diagnoses:  Nonspecific chest pain  COPD exacerbation ([place])  Acute respiratory failure with hypoxia [facility])    Rx / DC Orders ED Discharge Orders     None         Blanchie Dessert, MD 05/31/22 2234

## 2022-05-31 NOTE — ED Notes (Signed)
The pts sats were 915  NASAL O2 INCREASED TO 3 LITERS  SHE IS C/O PAIN AND NAUSEA    pain in her lt chest  still waiting on her c-t

## 2022-05-31 NOTE — ED Notes (Signed)
Pt ambulated to RR and back without oxygen and when getting back to room pt o2 was 89 and hr 110

## 2022-05-31 NOTE — ED Triage Notes (Signed)
Pt brought in by EMS from home. Pt tried to lay down at 1515 when she started to have L sided chest pain, sharp, under the breast with associated nausea. Pt gave herself 324asa en route. EMS gave 1 nitro with no relief, '4mg'$  zofran and '8mg'$  total of morphine.

## 2022-05-31 NOTE — ED Notes (Signed)
To ct

## 2022-06-01 ENCOUNTER — Encounter (HOSPITAL_COMMUNITY): Payer: Self-pay | Admitting: Internal Medicine

## 2022-06-01 DIAGNOSIS — R6510 Systemic inflammatory response syndrome (SIRS) of non-infectious origin without acute organ dysfunction: Secondary | ICD-10-CM | POA: Diagnosis present

## 2022-06-01 DIAGNOSIS — J9601 Acute respiratory failure with hypoxia: Secondary | ICD-10-CM | POA: Diagnosis present

## 2022-06-01 DIAGNOSIS — E875 Hyperkalemia: Secondary | ICD-10-CM | POA: Diagnosis present

## 2022-06-01 DIAGNOSIS — F1010 Alcohol abuse, uncomplicated: Secondary | ICD-10-CM | POA: Diagnosis present

## 2022-06-01 DIAGNOSIS — J441 Chronic obstructive pulmonary disease with (acute) exacerbation: Secondary | ICD-10-CM | POA: Diagnosis not present

## 2022-06-01 DIAGNOSIS — R1013 Epigastric pain: Secondary | ICD-10-CM | POA: Diagnosis present

## 2022-06-01 DIAGNOSIS — R7989 Other specified abnormal findings of blood chemistry: Secondary | ICD-10-CM | POA: Diagnosis present

## 2022-06-01 LAB — RESPIRATORY PANEL BY PCR

## 2022-06-01 LAB — CBC WITH DIFFERENTIAL/PLATELET
Abs Immature Granulocytes: 0.06 10*3/uL (ref 0.00–0.07)
Basophils Absolute: 0.1 10*3/uL (ref 0.0–0.1)
Basophils Relative: 1 %
Eosinophils Absolute: 0.1 10*3/uL (ref 0.0–0.5)
Eosinophils Relative: 1 %
HCT: 41.1 % (ref 36.0–46.0)
Hemoglobin: 13.8 g/dL (ref 12.0–15.0)
Immature Granulocytes: 1 %
Lymphocytes Relative: 17 %
Lymphs Abs: 2.3 10*3/uL (ref 0.7–4.0)
MCH: 32.5 pg (ref 26.0–34.0)
MCHC: 33.6 g/dL (ref 30.0–36.0)
MCV: 96.7 fL (ref 80.0–100.0)
Monocytes Absolute: 1 10*3/uL (ref 0.1–1.0)
Monocytes Relative: 7 %
Neutro Abs: 9.8 10*3/uL — ABNORMAL HIGH (ref 1.7–7.7)
Neutrophils Relative %: 73 %
Platelets: 239 10*3/uL (ref 150–400)
RBC: 4.25 MIL/uL (ref 3.87–5.11)
RDW: 13 % (ref 11.5–15.5)
WBC: 13.3 10*3/uL — ABNORMAL HIGH (ref 4.0–10.5)
nRBC: 0 % (ref 0.0–0.2)

## 2022-06-01 LAB — TROPONIN I (HIGH SENSITIVITY): Troponin I (High Sensitivity): 15 ng/L (ref <18)

## 2022-06-01 LAB — I-STAT VENOUS BLOOD GAS, ED
Acid-base deficit: 7 mmol/L — ABNORMAL HIGH (ref 0.0–2.0)
Bicarbonate: 21 mmol/L (ref 20.0–28.0)
Calcium, Ion: 1.15 mmol/L (ref 1.15–1.40)
HCT: 42 % (ref 36.0–46.0)
Hemoglobin: 14.3 g/dL (ref 12.0–15.0)
O2 Saturation: 96 %
Potassium: 4.3 mmol/L (ref 3.5–5.1)
Sodium: 134 mmol/L — ABNORMAL LOW (ref 135–145)
TCO2: 22 mmol/L (ref 22–32)
pCO2, Ven: 48.6 mmHg (ref 44–60)
pH, Ven: 7.244 — ABNORMAL LOW (ref 7.25–7.43)
pO2, Ven: 94 mmHg — ABNORMAL HIGH (ref 32–45)

## 2022-06-01 LAB — LIPASE, BLOOD: Lipase: 57 U/L — ABNORMAL HIGH (ref 11–51)

## 2022-06-01 LAB — PROTIME-INR
INR: 1 (ref 0.8–1.2)
Prothrombin Time: 13.5 seconds (ref 11.4–15.2)

## 2022-06-01 LAB — COMPREHENSIVE METABOLIC PANEL
ALT: 35 U/L (ref 0–44)
AST: 34 U/L (ref 15–41)
Albumin: 3.7 g/dL (ref 3.5–5.0)
Alkaline Phosphatase: 152 U/L — ABNORMAL HIGH (ref 38–126)
Anion gap: 10 (ref 5–15)
BUN: 10 mg/dL (ref 8–23)
CO2: 20 mmol/L — ABNORMAL LOW (ref 22–32)
Calcium: 8.5 mg/dL — ABNORMAL LOW (ref 8.9–10.3)
Chloride: 102 mmol/L (ref 98–111)
Creatinine, Ser: 1.01 mg/dL — ABNORMAL HIGH (ref 0.44–1.00)
GFR, Estimated: >60 mL/min (ref >60)
Glucose, Bld: 122 mg/dL — ABNORMAL HIGH (ref 70–99)
Potassium: 4.3 mmol/L (ref 3.5–5.1)
Sodium: 132 mmol/L — ABNORMAL LOW (ref 135–145)
Total Bilirubin: 0.6 mg/dL (ref 0.3–1.2)
Total Protein: 7 g/dL (ref 6.5–8.1)

## 2022-06-01 LAB — URINALYSIS, COMPLETE (UACMP) WITH MICROSCOPIC
Bacteria, UA: NONE SEEN
Bilirubin Urine: NEGATIVE
Glucose, UA: >=500 mg/dL — AB
Hgb urine dipstick: NEGATIVE
Ketones, ur: 5 mg/dL — AB
Leukocytes,Ua: NEGATIVE
Nitrite: POSITIVE — AB
Protein, ur: NEGATIVE mg/dL
Specific Gravity, Urine: >1.046 — ABNORMAL HIGH (ref 1.005–1.030)
pH: 5 (ref 5.0–8.0)

## 2022-06-01 LAB — PHOSPHORUS: Phosphorus: 3.2 mg/dL (ref 2.5–4.6)

## 2022-06-01 LAB — ETHANOL: Alcohol, Ethyl (B): <10 mg/dL (ref <10)

## 2022-06-01 LAB — BRAIN NATRIURETIC PEPTIDE: B Natriuretic Peptide: 77.6 pg/mL (ref 0.0–100.0)

## 2022-06-01 LAB — MAGNESIUM: Magnesium: 2.2 mg/dL (ref 1.7–2.4)

## 2022-06-01 MED ORDER — ATORVASTATIN CALCIUM 80 MG PO TABS
80.0000 mg | ORAL_TABLET | Freq: Every day | ORAL | Status: DC
  Administered 2022-06-01 – 2022-06-03 (×3): 80 mg via ORAL
  Filled 2022-06-01: qty 1
  Filled 2022-06-01: qty 2
  Filled 2022-06-01: qty 1

## 2022-06-01 MED ORDER — THIAMINE HCL 100 MG/ML IJ SOLN
100.0000 mg | Freq: Once | INTRAMUSCULAR | Status: AC
  Administered 2022-06-01: 100 mg via INTRAVENOUS
  Filled 2022-06-01: qty 2

## 2022-06-01 MED ORDER — THIAMINE MONONITRATE 100 MG PO TABS
100.0000 mg | ORAL_TABLET | Freq: Every day | ORAL | Status: DC
  Administered 2022-06-01 – 2022-06-03 (×3): 100 mg via ORAL
  Filled 2022-06-01 (×4): qty 1

## 2022-06-01 MED ORDER — ASPIRIN 81 MG PO TBEC
81.0000 mg | DELAYED_RELEASE_TABLET | Freq: Every day | ORAL | Status: DC
  Administered 2022-06-01 – 2022-06-03 (×3): 81 mg via ORAL
  Filled 2022-06-01 (×3): qty 1

## 2022-06-01 MED ORDER — GUAIFENESIN ER 600 MG PO TB12
600.0000 mg | ORAL_TABLET | Freq: Two times a day (BID) | ORAL | Status: DC
  Administered 2022-06-02: 600 mg via ORAL
  Filled 2022-06-01 (×2): qty 1

## 2022-06-01 MED ORDER — METHYLPREDNISOLONE SODIUM SUCC 40 MG IJ SOLR
40.0000 mg | Freq: Two times a day (BID) | INTRAMUSCULAR | Status: DC
  Administered 2022-06-01 – 2022-06-02 (×3): 40 mg via INTRAVENOUS
  Filled 2022-06-01 (×3): qty 1

## 2022-06-01 MED ORDER — PANTOPRAZOLE SODIUM 40 MG IV SOLR
40.0000 mg | Freq: Every day | INTRAVENOUS | Status: DC
  Administered 2022-06-01 – 2022-06-02 (×3): 40 mg via INTRAVENOUS
  Filled 2022-06-01 (×3): qty 10

## 2022-06-01 MED ORDER — BUDESONIDE 0.25 MG/2ML IN SUSP
0.2500 mg | Freq: Two times a day (BID) | RESPIRATORY_TRACT | Status: DC
  Administered 2022-06-01: 0.25 mg via RESPIRATORY_TRACT
  Filled 2022-06-01: qty 2

## 2022-06-01 MED ORDER — METOPROLOL SUCCINATE ER 100 MG PO TB24
100.0000 mg | ORAL_TABLET | Freq: Every day | ORAL | Status: DC
  Administered 2022-06-01 – 2022-06-03 (×3): 100 mg via ORAL
  Filled 2022-06-01: qty 1
  Filled 2022-06-01: qty 4
  Filled 2022-06-01: qty 1

## 2022-06-01 MED ORDER — ALBUTEROL SULFATE (2.5 MG/3ML) 0.083% IN NEBU: 2.5000 mg | INHALATION_SOLUTION | RESPIRATORY_TRACT | Status: DC | PRN

## 2022-06-01 MED ORDER — PROSIGHT PO TABS
1.0000 | ORAL_TABLET | Freq: Every day | ORAL | Status: DC
  Administered 2022-06-01 – 2022-06-03 (×3): 1 via ORAL
  Filled 2022-06-01 (×3): qty 1

## 2022-06-01 MED ORDER — BUDESONIDE 0.5 MG/2ML IN SUSP
0.5000 mg | Freq: Two times a day (BID) | RESPIRATORY_TRACT | Status: DC
  Administered 2022-06-01 – 2022-06-03 (×4): 0.5 mg via RESPIRATORY_TRACT
  Filled 2022-06-01 (×4): qty 2

## 2022-06-01 MED ORDER — FLUTICASONE PROPIONATE 50 MCG/ACT NA SUSP
2.0000 | Freq: Every day | NASAL | Status: DC
  Administered 2022-06-01 – 2022-06-03 (×3): 2 via NASAL
  Filled 2022-06-01: qty 16

## 2022-06-01 MED ORDER — DULOXETINE HCL 20 MG PO CPEP
20.0000 mg | ORAL_CAPSULE | Freq: Every day | ORAL | Status: DC
  Administered 2022-06-01 – 2022-06-03 (×3): 20 mg via ORAL
  Filled 2022-06-01 (×4): qty 1

## 2022-06-01 MED ORDER — NICOTINE 21 MG/24HR TD PT24: 21.0000 mg | MEDICATED_PATCH | Freq: Every day | TRANSDERMAL | Status: DC | PRN

## 2022-06-01 MED ORDER — FOLIC ACID 1 MG PO TABS
1.0000 mg | ORAL_TABLET | Freq: Every day | ORAL | Status: DC
  Administered 2022-06-01 – 2022-06-03 (×3): 1 mg via ORAL
  Filled 2022-06-01 (×3): qty 1

## 2022-06-01 MED ORDER — EMPAGLIFLOZIN 25 MG PO TABS
25.0000 mg | ORAL_TABLET | Freq: Every day | ORAL | Status: DC
  Administered 2022-06-02 – 2022-06-03 (×2): 25 mg via ORAL
  Filled 2022-06-01 (×2): qty 1

## 2022-06-01 MED ORDER — REVEFENACIN 175 MCG/3ML IN SOLN
175.0000 ug | Freq: Every day | RESPIRATORY_TRACT | Status: DC
  Administered 2022-06-01 – 2022-06-03 (×3): 175 ug via RESPIRATORY_TRACT
  Filled 2022-06-01 (×3): qty 3

## 2022-06-01 MED ORDER — ENOXAPARIN SODIUM 40 MG/0.4ML IJ SOSY
40.0000 mg | PREFILLED_SYRINGE | Freq: Every day | INTRAMUSCULAR | Status: DC
  Administered 2022-06-01 – 2022-06-02 (×2): 40 mg via SUBCUTANEOUS
  Filled 2022-06-01 (×2): qty 0.4

## 2022-06-01 MED ORDER — LORATADINE 10 MG PO TABS
10.0000 mg | ORAL_TABLET | Freq: Every day | ORAL | Status: DC
  Administered 2022-06-01 – 2022-06-03 (×3): 10 mg via ORAL
  Filled 2022-06-01 (×3): qty 1

## 2022-06-01 MED ORDER — ARFORMOTEROL TARTRATE 15 MCG/2ML IN NEBU
15.0000 ug | INHALATION_SOLUTION | Freq: Two times a day (BID) | RESPIRATORY_TRACT | Status: DC
  Administered 2022-06-01 – 2022-06-03 (×5): 15 ug via RESPIRATORY_TRACT
  Filled 2022-06-01 (×5): qty 2

## 2022-06-01 NOTE — H&P (Signed)
History and Physical      Katrina [name] [id number] DOB: 1956/02/07 DOA: 05/31/2022  PCP: Ronnell Freshwater, NP  Patient coming from: home   I have personally briefly reviewed patient's old medical records in [place]  Chief Complaint: Shortness of breath  HPI: Katrina [name] is a 67 y.o. female with medical history significant for COPD, chronic systolic heart failure, chronic left bundle branch block, hyperlipidemia, chronic alcohol abuse, who is admitted to [facility] on 05/31/2022 with acute COPD exacerbation after presenting from home to [facility] ED complaining of shortness of breath.   The patient reports 1 day progressive shortness of breath associated with worsening nonproductive cough as well as wheezing.  Denies any associated orthopnea, PND, or worsening peripheral edema.  Denies any associate subjective fever, chills, rigors, generalized myalgias.  Not associate with any new onset rhinitis, rhinorrhea, postnasal drip.  In the setting of her increased cough over the course the last day, she has noted some sharp epigastric discomfort, intensifying with cough, with some radiation around the right lateral aspect of her abdomen.  She notes some associated nausea in the absence of any vomiting.  No associated any melena, hematochezia, or diarrhea.  She acknowledges history of underlying COPD, and notes that she is a current cigarette smoker.  Denies any known baseline supplemental oxygen requirement.  Her medical history is also notable for chronic systolic heart failure, with most recent echocardiogram performed in October 2023, which demonstrated LVEF 35 to 40%, ventricular global hypokinesis, mildly dilated left ventricle internal cavity size, indeterminate diastolic parameters, normal right ventricular systolic function, moderately dilated left atrium, and trivial mitral regurgitation.  Additionally, she underwent left-sided coronary angiography in January 2023, which  reportedly showed no evidence of obstructive CAD.     ED Course:  Vital signs in the ED were notable for the following: Afebrile; heart rate [id number]; systolic blood pressures in the 120s 140s; respiratory 14-18, initial oxygen saturation 80% on room air, [place] improving into the range 93 to 96% 2 L nasal cannula.  Labs were notable for the following: CMP notable for potassium 5.3, creatinine 0.98, glucose 88, total bilirubin 1.9, otherwise liver enzymes within normal limits.  Lipase 26.  High-sensitivity troponin I initially 15, with repeat value trending up slightly to 21, in the absence of any prior high sensitive troponin I did point to the level of comparison.  D-dimer 0.68.  CBC notable for white blood cell count 12,800 with 60% neutrophils, hemoglobin 14.3.  COVID, influenza, RSV PCR were all negative.  Per my interpretation, EKG in ED demonstrated the following: EKG shows sinus tachycardia with known left bundle branch block, multiple PVCs, heart rate 99, and nonspecific T wave inversion in aVL, no evidence of interval ST changes.   Imaging and additional notable ED work-up: CT chest, performed radiology read, shows no evidence of acute pulmonary embolism or any evidence of any additional acute cardiopulmonary process, including no evidence of infiltrate, edema, effusion, or pneumothorax, while showing chronic emphysematous changes.  While in the ED, the following were administered: Solumedrol 125 mg IV x 1, do nebulizer treatments x 2, Reglan 10 mg IV x 2 doses, Zofran 4 mg IV x 1 dose, morphine 4 mg IV x  2 doses.  Subsequently, the patient was admitted for further evaluation management of suspected acute COPD exacerbation complicated by acute hypoxic respiratory distress, with presenting labs also notable for mildly elevated troponin as well as mild hyperkalemia.      Review  of Systems: As per HPI otherwise 10 point review of systems negative.   Past Medical History:  Diagnosis Date    Anxiety    Arthritis    knees, hips, elbows    Asthma    hx of asthma 3/12- hospitalized    Chronic systolic heart failure (HCC)    COPD (chronic obstructive pulmonary disease) (HCC)    Depression    GERD (gastroesophageal reflux disease)    PRIOR TO HAVING GALLBLADDER REMOVED   Headache    from her neck   Left bundle branch block 06/28/2010   Lung infection 2015   Lung infection     Past Surgical History:  Procedure Laterality Date   BACK SURGERY     x2   CARPAL TUNNEL RELEASE     left    CESAREAN SECTION     x 2   CHOLECYSTECTOMY     COLONOSCOPY  10/2017   DILATATION & CURETTAGE/HYSTEROSCOPY WITH MYOSURE N/A 12/10/2017   Procedure: DILATATION & CURETTAGE/HYSTEROSCOPY WITH MYOSURE;  Surgeon: Christophe Louis, MD;  Location: [facility];  Service: Gynecology;  Laterality: N/A;  POSSIBLE MYOMECTOMY VS POLYPECTOMY WITH MYOSURE   DILATION AND CURETTAGE OF UTERUS     ELBOW SURGERY     JOINT REPLACEMENT     KNEE ARTHROSCOPY     bilateral x 2    KNEE ARTHROSCOPY Left 11/03/2013   Procedure: LEFT KNEE ARTHROSCOPY WITH DEBRIDEMENT, PARTIAL SYNOVECTOMY;  Surgeon: Mcarthur Rossetti, MD;  Location: [facility];  Service: Orthopedics;  Laterality: Left;   LAPAROSCOPY     left elbow surgery      OTHER SURGICAL HISTORY     arthroswcopic surgery right knee    OTHER SURGICAL HISTORY     arthroscopic surgery left knee x 2    OTHER SURGICAL HISTORY     bunionectomy right foot    OTHER SURGICAL HISTORY     C Section x 2    OTHER SURGICAL HISTORY     carpal tunnel on left    POSTERIOR CERVICAL FUSION/FORAMINOTOMY N/A 04/02/2020   Procedure: RIGHT C5-6 FORAMINOTOMY;  Surgeon: Jessy Oto, MD;  Location: [facility];  Service: Orthopedics;  Laterality: N/A;   RADIOLOGY WITH ANESTHESIA N/A 07/28/2019   Procedure: MRI WITH ANESTHESIA OF NECK SOFT TISSUE ONLY WITH AND WITHOUT CONTRAST;  Surgeon: Radiologist, Medication, MD;  Location: [facility];  Service: Radiology;  Laterality: N/A;   RADIOLOGY WITH  ANESTHESIA N/A 10/04/2019   Procedure: MRI WITH ANESTHESIA RIGHT SHOULDER WITHOUT CONTRAST,MRI OF CERVICAL SPINE WITHOUT CONTRAST;  Surgeon: Radiologist, Medication, MD;  Location: [place];  Service: Radiology;  Laterality: N/A;   RADIOLOGY WITH ANESTHESIA N/A 08/30/2020   Procedure: MRI WITH ANESTHESIA CERVICAL SPINE WITHOUT CONTRAST;  Surgeon: Radiologist, Medication, MD;  Location: [facility];  Service: Radiology;  Laterality: N/A;   right foot bunionectomy      RIGHT/LEFT HEART CATH AND CORONARY ANGIOGRAPHY N/A 04/26/2021   Procedure: RIGHT/LEFT HEART CATH AND CORONARY ANGIOGRAPHY;  Surgeon: Early Osmond, MD;  Location: [facility];  Service: Cardiovascular;  Laterality: N/A;   ROBOTIC ASSISTED TOTAL HYSTERECTOMY WITH BILATERAL SALPINGO OOPHERECTOMY Bilateral 06/16/2018   Procedure: XI ROBOTIC ASSISTED TOTAL HYSTERECTOMY WITH RIGHT SALPINGO OOPHORECTOMY;  Surgeon: Christophe Louis, MD;  Location: [facility];  Service: Gynecology;  Laterality: Bilateral;   TOTAL HIP ARTHROPLASTY  05/02/2011   Procedure: TOTAL HIP ARTHROPLASTY ANTERIOR APPROACH;  Surgeon: Mcarthur Rossetti, MD;  Location: [facility];  Service: Orthopedics;  Laterality: Left;  Left Total Hip Arthroplasty, Direct Anterior Approach   TOTAL KNEE ARTHROPLASTY Right 12/03/2012   Procedure: RIGHT TOTAL KNEE ARTHROPLASTY;  Surgeon: Mcarthur Rossetti, MD;  Location: [facility];  Service: Orthopedics;  Laterality: Right;   TOTAL KNEE ARTHROPLASTY Left 12/15/2014   Procedure: LEFT TOTAL KNEE ARTHROPLASTY;  Surgeon: Mcarthur Rossetti, MD;  Location: [facility];  Service: Orthopedics;  Laterality: Left;   TUBAL LIGATION     ULNAR NERVE TRANSPOSITION     left    UPPER GI ENDOSCOPY     x 2    Social History:  reports that she has been smoking cigarettes. She has a 25.00 pack-year smoking history. She has never used smokeless tobacco. She reports current alcohol use of about 49.0 standard drinks of alcohol per week. She reports  that she does not use drugs.   Allergies  Allergen Reactions   Codeine Itching and Nausea Only    Family History  Problem Relation Age of Onset   Heart disease Mother    High Cholesterol Mother    High blood pressure Mother    Depression Brother    Diabetes Brother     Family history reviewed and not pertinent    Prior to Admission medications   Medication Sig Start Date End Date Taking? Authorizing Provider  acetaminophen (TYLENOL) 500 MG tablet Take 1,000 mg by mouth every 6 (six) hours as needed for moderate pain.   Yes [provider]  aspirin EC 81 MG tablet Take 1 tablet (81 mg total) by mouth daily. Swallow whole. 03/21/21  Yes Early Osmond, MD  atorvastatin (LIPITOR) 80 MG tablet Take 1 tablet (80 mg total) by mouth daily. 08/22/21  Yes Early Osmond, MD  Budeson-Glycopyrrol-Formoterol (BREZTRI AEROSPHERE) 160-9-4.8 MCG/ACT AERO Inhale 2 puffs into the lungs in the morning and at bedtime. 07/01/21  Yes Icard, Bradley L, DO  diclofenac Sodium (VOLTAREN ARTHRITIS PAIN) 1 % GEL Apply 2 g topically 4 (four) times daily as needed (pain).   Yes [provider]  DULoxetine (CYMBALTA) 20 MG capsule Take 1 capsule (20 mg total) by mouth daily. 05/02/22 05/02/23 Yes Jennye Boroughs, MD  empagliflozin (JARDIANCE) 25 MG TABS tablet Take 1 tablet (25 mg total) by mouth daily before breakfast. 07/26/21  Yes Early Osmond, MD  losartan (COZAAR) 50 MG tablet Take 1 tablet (50 mg total) by mouth daily. 05/02/22  Yes Early Osmond, MD  metoprolol succinate (TOPROL-XL) 100 MG 24 hr tablet Take 1 tablet (100 mg total) by mouth daily. Take with or immediately following a meal. 01/15/22  Yes Early Osmond, MD  spironolactone (ALDACTONE) 25 MG tablet Take 1 tablet (25 mg total) by mouth at bedtime. 05/26/22  Yes Early Osmond, MD  clonazePAM (KLONOPIN) 0.5 MG tablet Take 1 tablet (0.5 mg total) by mouth 2 (two) times daily as needed for anxiety. 08/24/19 02/22/20   Jessy Oto, MD     Objective    Physical Exam: Vitals:   05/31/22 2000 05/31/22 2100 05/31/22 2141 05/31/22 2300  BP: 124/71 (!) 146/81  (!) 145/73  Pulse: 86 86  92  Resp: 11 16  (!) 8  Temp:   97.9 F (36.6 C)   TempSrc:      SpO2: 94% 93%  96%  Weight:      Height:        General: appears to be stated age; alert, oriented; mildly increased work of breathing noted. Skin: warm, dry, no rash Head:  AT/[place] Mouth:  Oral mucosa membranes appear moist, normal dentition Neck: supple; trachea midline Heart:  RRR; did not appreciate any M/R/G Lungs: CTAB, did not appreciate any wheezes, rales, or rhonchi Abdomen: + BS; soft, ND, very mild tenderness over the epigastrium, in the absence of any associated guarding, rigidity, or rebound tenderness. Vascular: 2+ pedal pulses b/l; 2+ radial pulses b/l Extremities: no peripheral edema, no muscle wasting Neuro: strength and sensation intact in upper and lower extremities b/l   Labs on Admission: I have personally reviewed following labs and imaging studies  CBC: Recent Labs  Lab 05/31/22 1701  WBC 12.8*  NEUTROABS 7.9*  HGB 14.3  HCT 42.4  MCV 96.4  PLT [id number]   Basic Metabolic Panel: Recent Labs  Lab 05/31/22 1701  NA 132*  K 5.3*  CL 102  CO2 16*  GLUCOSE 88  BUN 12  CREATININE 0.98  CALCIUM 8.6*   GFR: Estimated Creatinine Clearance: 70.8 mL/min (by C-G formula based on SCr of 0.98 mg/dL). Liver Function Tests: Recent Labs  Lab 05/31/22 1701  AST 29  ALT 25  ALKPHOS 132*  BILITOT 1.9*  PROT 6.9  ALBUMIN 3.5   Recent Labs  Lab 05/31/22 1701  LIPASE 96*   No results for input(s): "AMMONIA" in the last 168 hours. Coagulation Profile: No results for input(s): "INR", "PROTIME" in the last 168 hours. Cardiac Enzymes: No results for input(s): "CKTOTAL", "CKMB", "CKMBINDEX", "TROPONINI" in the last 168 hours. BNP (last 3 results) No results for input(s): "PROBNP" in the last 8760 hours. HbA1C: No  results for input(s): "HGBA1C" in the last 72 hours. CBG: No results for input(s): "GLUCAP" in the last 168 hours. Lipid Profile: No results for input(s): "CHOL", "HDL", "LDLCALC", "TRIG", "CHOLHDL", "LDLDIRECT" in the last 72 hours. Thyroid Function Tests: No results for input(s): "TSH", "T4TOTAL", "FREET4", "T3FREE", "THYROIDAB" in the last 72 hours. Anemia Panel: No results for input(s): "VITAMINB12", "FOLATE", "FERRITIN", "TIBC", "IRON", "RETICCTPCT" in the last 72 hours. Urine analysis:    Component Value Date/Time   COLORURINE YELLOW 11/26/2012 0742   APPEARANCEUR TURBID (A) 11/26/2012 0742   LABSPEC 1.031 (H) 11/26/2012 0742   PHURINE 5.5 11/26/2012 0742   GLUCOSEU NEGATIVE 11/26/2012 0742   HGBUR NEGATIVE 11/26/2012 0742   BILIRUBINUR NEGATIVE 11/26/2012 0742   KETONESUR NEGATIVE 11/26/2012 0742   PROTEINUR NEGATIVE 11/26/2012 0742   UROBILINOGEN 1.0 11/26/2012 0742   NITRITE NEGATIVE 11/26/2012 0742   LEUKOCYTESUR NEGATIVE 11/26/2012 0742    Radiological Exams on Admission: CT Angio Chest PE W and/or Wo Contrast  Result Date: 05/31/2022 CLINICAL DATA:  Pulmonary embolism (PE) suspected, low to intermediate prob, positive D-dimer EXAM: CT ANGIOGRAPHY CHEST WITH CONTRAST TECHNIQUE: Multidetector CT imaging of the chest was performed using the standard protocol during bolus administration of intravenous contrast. Multiplanar CT image reconstructions and MIPs were obtained to evaluate the vascular anatomy. RADIATION DOSE REDUCTION: This exam was performed according to the departmental dose-optimization program which includes automated exposure control, adjustment of the mA and/or kV according to patient size and/or use of iterative reconstruction technique. CONTRAST:  2m OMNIPAQUE IOHEXOL 350 MG/ML SOLN COMPARISON:  06/12/2021 FINDINGS: Cardiovascular: Adequate opacification of the pulmonary arterial tree. No intraluminal filling defect identified to suggest acute pulmonary  embolism. Central pulmonary arteries are of normal caliber. No significant coronary artery calcification. Cardiac size within normal limits. No pericardial effusion. Mild atherosclerotic calcification within the thoracic aorta. No aortic aneurysm. Mediastinum/Nodes: No enlarged mediastinal, hilar, or axillary lymph nodes. Thyroid gland, trachea, and esophagus  demonstrate no significant findings. Lungs/Pleura: Mild emphysema. Known right lower lobe ground-glass pulmonary nodule is partially obscured by discoid atelectasis at axial image # 90/6. No new focal pulmonary nodules or infiltrates. No pneumothorax or pleural effusion. Bronchial wall thickening is present in keeping with airway inflammation. No central obstructing lesion. Upper Abdomen: Status post cholecystectomy.  No acute abnormality. Musculoskeletal: Osseous structures are age-appropriate. No acute bone abnormality. No lytic or blastic bone lesion. Review of the MIP images confirms the above findings. IMPRESSION: 1. No pulmonary embolism. No acute intrathoracic pathology identified. 2. Mild emphysema. 3. Known right lower lobe ground-glass pulmonary nodule is partially obscured by discoid atelectasis on today's examination. Re-evaluation with outpatient routine lung cancer screening CT examination is recommended for continued observation. 4. Bronchial wall thickening in keeping with airway inflammation. No central obstructing lesion. Emphysema (ICD10-J43.9). Electronically Signed   By: Fidela Salisbury M.D.   On: 05/31/2022 21:59   DG Chest Port 1 View  Result Date: 05/31/2022 CLINICAL DATA:  Left-sided chest and epigastric pain. EXAM: PORTABLE CHEST 1 VIEW COMPARISON:  Chest CT 06/12/2021 FINDINGS: The cardiomediastinal contours are normal. Hyperinflation and bronchial thickening. Pulmonary vasculature is normal. No consolidation, pleural effusion, or pneumothorax. Spurring at the right first rib costochondral junction. No acute osseous abnormalities  are seen. IMPRESSION: Hyperinflation and bronchial thickening, likely related to known emphysema. No acute findings. Electronically Signed   By: Keith Rake M.D.   On: 05/31/2022 17:30      Assessment/Plan   Principal Problem:   Acute exacerbation of chronic obstructive pulmonary disease (COPD) ([place]) Active Problems:   Tobacco abuse   Hyperlipidemia   Chronic systolic CHF (congestive heart failure) (HCC)   Acute hypoxic respiratory failure (HCC)   Elevated troponin   Hyperkalemia   SIRS (systemic inflammatory response syndrome) (HCC)   Epigastric pain   Chronic alcohol abuse    #) Acute COPD exacerbation: in the context of a documented history of COPD, diagnosis of acute exacerbation on the basis of 1 day of progressive shortness of breath associated with new onset cough, wheezing, increased work of breathing, with presenting CTA chest showing no evidence of acute cardiopulmonary process, including no evidence of infiltrate, edema, effusion, or pneumothorax nor any evidence of acute pulmonary embolism.   Etiology of exac is not entirely clear at this time. no clinical or radiographic evidence to suggest acutely decompensated heart failure at this time. Additionally, ACS appears less likely in the absence of chest pain, with EKG showing no evidence of acute ischemic changes, and in the absence of any overt chest pain. COVID-19/influenza/RSV PCR are negative. Outpatient respiratory regimen appears to include Breztri, with which the patient conveys good compliance as an outpatient.  Of note, the patient is a long-term smoker, and acknowledges that she continues to smoke cigarettes.   Plan: monitor continuous pulse oxymetry. Monitor on telemetry. Solumedrol. Scheduled duonebs q6 hours. Prn albuterol inhaler.  CMP in the morning. Repeat CBC in the morning. Check serum Mg and Phos levels. Will attempt additional chart review to evaluate most recent PFT results. Will start Azithromycin  for  benefit of shortened duration of hospitalization associated with antibiotic initiation in the setting of acute COPD exacerbation. Check blood gas.  Counseled the patient on the importance of complete smoking discontinuation, as further outlined below.  Add on BNP.           #) Acute hypoxic respiratory distress: in the context of acute respiratory symptoms and no known baseline supplemental O2 requirements, presenting O2  sat noted to be in the high 80s, says improving into the mid 90s on 2 L nasal cannula, thereby meeting criteria for acute hypoxic respiratory distress as opposed to acute hypoxic respiratory failure at this time. Appears to be on basis of acute COPD exacerbation, as above.  CTA chest shows no evidence of acute cardiopulmonary process, including no evidence of acute pulmonary embolism.  No clinical or radiographic evidence to suggest acutely decompensated heart failure.  Additionally, COVID, influenza, RSV PCR were all negative.  Suspect that mildly elevated troponin is on the basis of supply demand mismatch resulting from the decrease in oxygen delivery capacity that is a/w presenting acute hypoxic respiratory distress as opposed to representing a type I process due to acute plaque rupture, particularly given presenting ekg that shows no e/o acute ischemic changes, including no evidence of STEMI, and the absence of any reported chest pain a/w this presentation.  Additionally, it is less likely that presentation would be consistent with ACS after the patient just had left-sided coronary angiography 1 year ago that showed no evidence of obstructive CAD.  Overall, ACS is felt to be less likely relative to type 2 supply demand mismatch, as above, but will closely monitor on telemetry overnight while continuing to trend troponin, And treating suspected underlying acute COPD exacerbation.    Plan: further evaluation/management of presenting acute COPD exacerbation, as above. Monitor  continuous pulse ox with prn supplemental O2 to maintain O2 sats greater than or equal to 92%. monitor on telemetry. CMP/CBC in the AM. Check serum Mg and Phos levels. Check blood gas.  Trend troponin.  Add on BNP.              #) Hyperkalemia: Very mild elevation and presents for potassium level, noted to be 5.3.  Suspect pharmacologic contributions from home losartan and spironolactone.  Overall, will hold these home medications, and repeat serum potassium level in the morning, while monitoring on telemetry in the interval.  Plan: Hold home spironolactone, losartan.  Repeat CMP in the morning.  Monitor on telemetry.  On serum magnesium level.  Monitor strict I's and O's and daily weights.             #) SIRS criteria present: Presentation associated with mild leukocytosis, and mild tachycardia.  However, in the absence of e/o underlying infection at this time, including, CTA chest showed no evidence of acute process, including no evidence of infiltrate, while COVID, influenza, RSV PCR all negative, criteria for sepsis not currently met.  suspect non-infectious factors contributing to these SIRS findings, including reactive inflammatory contribution from leading to leukocytosis in the setting of acute COPD exacerbation, with possibility of further escalation of white blood cell count given plan for additional interval systemic corticosteroids as further management of such. patient appears hemodynamically stable at this time.  Consequently, will refrain from initiation of IV antibiotics at this time, with the exception of the aforementioned initiation of azithromycin for component of management of presenting acute COPD exacerbation, as further detailed above.   Plan: Repeat CBC with diff in the AM.  Monitor strict I's and O's and daily weights.  Monitor on telemetry. Refraining from IV abx for now, as above.  Check urinalysis.            #) Epigastric discomfort: Started  after new onset cough, raising possibility of musculoskeletal component.  No context of the patient's chronic alcohol abuse, differential also includes peptic ulcer disease versus gastritis.  She also has a documented history  of GERD.  Will initiate IV Protonix, and further trend lipase level.  Of note, no evidence of acute peritoneal signs on physical exam.  Plan: Further evaluation management of presenting acute COPD exacerbation, as above.  Protonix 40 mg IV daily, first dose now.  Repeat lipase in the morning.  Repeat CMP, CBC in the morning.  As needed acetaminophen.                   #) Chronic systolic heart failure: documented history of such, with most recent echocardiogram performed in October 2023, which is notable for LVEF 35 to 40% as well as indeterminate diastolic parameters, with additional results as conveyed above. No clinical or radiographic evidence to suggest acutely decompensated heart failure at this time. home diuretic regimen reportedly consists of the following: Spironolactone, which will be held for now given presenting mild hyperkalemia, as further quantified above. Home cardiac medications also include the following: Losartan, which will be held in the setting of mild hyperkalemia, as well as metoprolol succinate.  Given that most recent echocardiogram shows global left-ventricular hypokinesis with mildly dilated left ventricular internal cavity size, suspect contribution from chronic alcohol abuse leading to her chronic systolic heart failure, particular given the absence of evidence of a contribution from ischemic cardiomyopathy given evidence of nonobstructive CAD on left-sided coronary angiography from January 2023.   Plan: monitor strict I's & O's and daily weights. Repeat CMP in AM. Check serum mag level.  Hold home spironolactone and losartan for now, as above.  Continue home metoprolol succinate.  Add on BNP.            #) Hyperlipidemia:  documented h/o such. On high intensity atorvastatin as outpatient.   Plan: continue home statin.             #) Chronic Alcohol Abuse: the patient reports typical daily alcohol consumption of approximately a sixpack of beer, and that typical daily alcohol consumption of this volume has been occurring for several years. Denies any interest in alcohol consumption discontinuation at this time. Most recent alcohol consumption occurred within the last day. Close monitoring for development of evidence of alcohol withdrawal, including close attention to trend in vital signs, as well as close monitoring of electrolytes, as described below.  At this time, no overt evidence of active alcohol withdrawal.   Plan: counseled the patient on the importance of reduction in alcohol consumption. Consult to transition of care team placed. Close monitoring of ensuing BP and HR via routine VS. Telemetry. Add-on serum Mg level. Check serum phosphorus level. Repeat CMP in the morning. Check INR. Add-on serum ethanol level. UDS.  Thiamine 100 mg IV x 1 followed by daily oral thiamine, folic acid, multivitamin supplementation.  As patient has no interest in discontinuation of alcohol consumption at this time, I have also ordered beer to be provided twice daily with meals in order to help prevent ensuing development of alcohol withdrawal.            #) Chronic tobacco abuse: Patient conveys that they are a current smoker, having smoked 1 ppd for greater than 25 years years.   Plan: Counseled the patient for less than 2 minutes on the importance of complete smoking discontinuation.  Order placed for prn nicotine patch for use during this hospitalization.         DVT prophylaxis: SCD's   Code Status: Full code Family Communication: none Disposition Plan: Per Rounding Team Consults called: none;  Admission status: Inpatient  I SPENT GREATER THAN 75  MINUTES IN CLINICAL CARE TIME/MEDICAL  DECISION-MAKING IN COMPLETING THIS ADMISSION.      [place] DO [facility]  From [place]   06/01/2022, 12:07 AM

## 2022-06-01 NOTE — ED Notes (Signed)
ED TO INPATIENT HANDOFF REPORT  ED Nurse Name and Phone #: Massie Maroon RN [phone]  S Name/Age/Gender Katrina [name] 67 y.o. female Room/Bed: 002C/002C  Code Status   Code Status: Full Code  Home/SNF/Other Home Patient oriented to: self, place, time, and situation Is this baseline? Yes   Triage Complete: Triage complete  Chief Complaint Acute exacerbation of chronic obstructive pulmonary disease (COPD) ([place]) [J44.1] Acute hypoxic respiratory failure ([place]) [J96.01]  Triage Note Pt brought in by EMS from home. Pt tried to lay down at 1515 when she started to have L sided chest pain, sharp, under the breast with associated nausea. Pt gave herself 324asa en route. EMS gave 1 nitro with no relief, '4mg'$  zofran and '8mg'$  total of morphine.    Allergies Allergies  Allergen Reactions   Codeine Itching and Nausea Only    Level of Care/Admitting Diagnosis ED Disposition     ED Disposition  Admit   Condition  --   [place]: [place] [[id number]]  Level of Care: Progressive [102]  Admit to Progressive based on following criteria: RESPIRATORY PROBLEMS hypoxemic/hypercapnic respiratory failure that is responsive to NIPPV (BiPAP) or High Flow Nasal Cannula (6-80 lpm). Frequent assessment/intervention, no > Q2 hrs < Q4 hrs, to maintain oxygenation and pulmonary hygiene.  May admit patient to [name] or [facility] if equivalent level of care is available:: No  Covid Evaluation: Confirmed COVID Negative  Diagnosis: Acute hypoxic respiratory failure [facility]) [id number]  Admitting Physician: Rhetta Mura [id number]  Attending Physician: Elmarie Shiley [id number]  Certification:: I certify this patient will need inpatient services for at least 2 midnights          B Medical/Surgery History Past Medical History:  Diagnosis Date   Alcohol abuse    Anxiety    Arthritis    knees, hips, elbows    Asthma    hx of asthma 3/12- hospitalized    Chronic  systolic heart failure ([place])    COPD (chronic obstructive pulmonary disease) ([place])    Depression    GERD (gastroesophageal reflux disease)    PRIOR TO HAVING GALLBLADDER REMOVED   Headache    from her neck   Left bundle branch block 06/28/2010   Lung infection 2015   Tobacco abuse    Past Surgical History:  Procedure Laterality Date   BACK SURGERY     x2   CARPAL TUNNEL RELEASE     left    CESAREAN SECTION     x 2   CHOLECYSTECTOMY     COLONOSCOPY  10/2017   DILATATION & CURETTAGE/HYSTEROSCOPY WITH MYOSURE N/A 12/10/2017   Procedure: DILATATION & CURETTAGE/HYSTEROSCOPY WITH MYOSURE;  Surgeon: Christophe Louis, MD;  Location: [facility];  Service: Gynecology;  Laterality: N/A;  POSSIBLE MYOMECTOMY VS POLYPECTOMY WITH MYOSURE   DILATION AND CURETTAGE OF UTERUS     ELBOW SURGERY     JOINT REPLACEMENT     KNEE ARTHROSCOPY     bilateral x 2    KNEE ARTHROSCOPY Left 11/03/2013   Procedure: LEFT KNEE ARTHROSCOPY WITH DEBRIDEMENT, PARTIAL SYNOVECTOMY;  Surgeon: Mcarthur Rossetti, MD;  Location: [facility];  Service: Orthopedics;  Laterality: Left;   LAPAROSCOPY     left elbow surgery      OTHER SURGICAL HISTORY     arthroswcopic surgery right knee    OTHER SURGICAL HISTORY     arthroscopic surgery left knee x 2    OTHER SURGICAL HISTORY  bunionectomy right foot    OTHER SURGICAL HISTORY     C Section x 2    OTHER SURGICAL HISTORY     carpal tunnel on left    POSTERIOR CERVICAL FUSION/FORAMINOTOMY N/A 04/02/2020   Procedure: RIGHT C5-6 FORAMINOTOMY;  Surgeon: Jessy Oto, MD;  Location: [facility];  Service: Orthopedics;  Laterality: N/A;   RADIOLOGY WITH ANESTHESIA N/A 07/28/2019   Procedure: MRI WITH ANESTHESIA OF NECK SOFT TISSUE ONLY WITH AND WITHOUT CONTRAST;  Surgeon: Radiologist, Medication, MD;  Location: [facility];  Service: Radiology;  Laterality: N/A;   RADIOLOGY WITH ANESTHESIA N/A 10/04/2019   Procedure: MRI WITH ANESTHESIA RIGHT SHOULDER WITHOUT CONTRAST,MRI OF CERVICAL SPINE  WITHOUT CONTRAST;  Surgeon: Radiologist, Medication, MD;  Location: [facility];  Service: Radiology;  Laterality: N/A;   RADIOLOGY WITH ANESTHESIA N/A 08/30/2020   Procedure: MRI WITH ANESTHESIA CERVICAL SPINE WITHOUT CONTRAST;  Surgeon: Radiologist, Medication, MD;  Location: [facility];  Service: Radiology;  Laterality: N/A;   right foot bunionectomy      RIGHT/LEFT HEART CATH AND CORONARY ANGIOGRAPHY N/A 04/26/2021   Procedure: RIGHT/LEFT HEART CATH AND CORONARY ANGIOGRAPHY;  Surgeon: Early Osmond, MD;  Location: [facility];  Service: Cardiovascular;  Laterality: N/A;   ROBOTIC ASSISTED TOTAL HYSTERECTOMY WITH BILATERAL SALPINGO OOPHERECTOMY Bilateral 06/16/2018   Procedure: XI ROBOTIC ASSISTED TOTAL HYSTERECTOMY WITH RIGHT SALPINGO OOPHORECTOMY;  Surgeon: Christophe Louis, MD;  Location: [facility];  Service: Gynecology;  Laterality: Bilateral;   TOTAL HIP ARTHROPLASTY  05/02/2011   Procedure: TOTAL HIP ARTHROPLASTY ANTERIOR APPROACH;  Surgeon: Mcarthur Rossetti, MD;  Location[facility];  Service: Orthopedics;  Laterality: Left;  Left Total Hip Arthroplasty, Direct Anterior Approach   TOTAL KNEE ARTHROPLASTY Right 12/03/2012   Procedure: RIGHT TOTAL KNEE ARTHROPLASTY;  Surgeon: Mcarthur Rossetti, MD;  [facility];  Service: Orthopedics;  Laterality: Right;   TOTAL KNEE ARTHROPLASTY Left 12/15/2014   Procedure: LEFT TOTAL KNEE ARTHROPLASTY;  Surgeon: Mcarthur Rossetti, MD;  Location[facility];  Service: Orthopedics;  Laterality: Left;   TUBAL LIGATION     ULNAR NERVE TRANSPOSITION     left    UPPER GI ENDOSCOPY     x 2     A IV Location/Drains/Wounds Patient Lines/Drains/Airways Status     Active Line/Drains/Airways     Name Placement date Placement time Site Days   Peripheral IV 05/31/22 20 G Anterior;Proximal;Right Forearm 05/31/22  --  Forearm  1   Incision (Closed) 04/02/20 Neck Right 04/02/20  1044  -- 790            Intake/Output Last 24 hours No  intake or output data in the 24 hours ending 06/01/22 1143  Labs/Imaging Results for orders placed or performed during the hospital encounter of 05/31/22 (from the past 48 hour(s))  CBC with Differential/Platelet     Status: Abnormal   Collection Time: 05/31/22  5:01 PM  Result Value Ref Range   WBC 12.8 (H) 4.0 - 10.5 K/uL   RBC 4.40 3.87 - 5.11 MIL/uL   Hemoglobin 14.3 12.0 - 15.0 g/dL   HCT 42.4 36.0 - 46.0 %   MCV 96.4 80.0 - 100.0 fL   MCH 32.5 26.0 - 34.0 pg   MCHC 33.7 30.0 - 36.0 g/dL   RDW 13.0 11.5 - 15.5 %   Platelets 264 150 - 400 K/uL   nRBC 0.0 0.0 - 0.2 %   Neutrophils Relative % 62 %   Neutro Abs 7.9 (  H) 1.7 - 7.7 K/uL   Lymphocytes Relative 25 %   Lymphs Abs 3.3 0.7 - 4.0 K/uL   Monocytes Relative 9 %   Monocytes Absolute 1.2 (H) 0.1 - 1.0 K/uL   Eosinophils Relative 3 %   Eosinophils Absolute 0.4 0.0 - 0.5 K/uL   Basophils Relative 1 %   Basophils Absolute 0.1 0.0 - 0.1 K/uL   Immature Granulocytes 0 %   Abs Immature Granulocytes 0.05 0.00 - 0.07 K/uL    Comment: Performed at [facility] [address], [place], [place] [address]  Comprehensive metabolic panel     Status: Abnormal   Collection Time: 05/31/22  5:01 PM  Result Value Ref Range   Sodium 132 (L) 135 - 145 mmol/L   Potassium 5.3 (H) 3.5 - 5.1 mmol/L   Chloride 102 98 - 111 mmol/L   CO2 16 (L) 22 - 32 mmol/L   Glucose, Bld 88 70 - 99 mg/dL    Comment: Glucose reference range applies only to samples taken after fasting for at least 8 hours.   BUN 12 8 - 23 mg/dL   Creatinine, Ser 0.98 0.44 - 1.00 mg/dL   Calcium 8.6 (L) 8.9 - 10.3 mg/dL   Total Protein 6.9 6.5 - 8.1 g/dL   Albumin 3.5 3.5 - 5.0 g/dL   AST 29 15 - 41 U/L   ALT 25 0 - 44 U/L   Alkaline Phosphatase 132 (H) 38 - 126 U/L   Total Bilirubin 1.9 (H) 0.3 - 1.2 mg/dL   GFR, Estimated >60 >60 mL/min    Comment: (NOTE) Calculated using the CKD-EPI Creatinine Equation (2021)    Anion gap 14 5 - 15    Comment: Performed at  [facility], [place] [address] [place], [place] [address]  Troponin I (High Sensitivity)     Status: None   Collection Time: 05/31/22  5:01 PM  Result Value Ref Range   Troponin I (High Sensitivity) 15 <18 ng/L    Comment: (NOTE) Elevated high sensitivity troponin I (hsTnI) values and significant  changes across serial measurements may suggest ACS but many other  chronic and acute conditions are known to elevate hsTnI results.  Refer to the "Links" section for chest pain algorithms and additional  guidance. Performed at [facility], [place] [address] [place], [place] [address]   Lipase, blood     Status: Abnormal   Collection Time: 05/31/22  5:01 PM  Result Value Ref Range   Lipase 96 (H) 11 - 51 U/L    Comment: Performed at [facility] [address] [place], [place] [address]  D-dimer, quantitative     Status: Abnormal   Collection Time: 05/31/22  5:01 PM  Result Value Ref Range   D-Dimer, Quant 0.68 (H) 0.00 - 0.50 ug/mL-FEU    Comment: (NOTE) At the manufacturer cut-off value of 0.5 g/mL FEU, this assay has a negative predictive value of 95-100%.This assay is intended for use in conjunction with a clinical pretest probability (PTP) assessment model to exclude pulmonary embolism (PE) and deep venous thrombosis (DVT) in outpatients suspected of PE or DVT. Results should be correlated with clinical presentation. Performed at [facility], [place] [address] [place], [place] [address]   Brain natriuretic peptide     Status: None   Collection Time: 05/31/22  5:01 PM  Result Value Ref Range   B Natriuretic Peptide 77.6 0.0 - 100.0 pg/mL    Comment:  Performed at [facility], [place] [address] [place], [place] [address]  Troponin I (High Sensitivity)     Status: Abnormal   Collection Time: 05/31/22  7:01 PM  Result Value Ref Range   Troponin I (High Sensitivity) 21 (H) <18 ng/L    Comment: (NOTE) Elevated high sensitivity troponin I (hsTnI) values  and significant  changes across serial measurements may suggest ACS but many other  chronic and acute conditions are known to elevate hsTnI results.  Refer to the "Links" section for chest pain algorithms and additional  guidance. Performed at [facility], [place] [address], [place], [place] [address]   Resp panel by RT-PCR (RSV, Flu A&B, Covid) Anterior Nasal Swab     Status: None   Collection Time: 05/31/22 10:33 PM   Specimen: Anterior Nasal Swab  Result Value Ref Range   SARS Coronavirus 2 by RT PCR NEGATIVE NEGATIVE   Influenza A by PCR NEGATIVE NEGATIVE   Influenza B by PCR NEGATIVE NEGATIVE    Comment: (NOTE) The Xpert Xpress SARS-CoV-2/FLU/RSV plus assay is intended as an aid in the diagnosis of influenza from Nasopharyngeal swab specimens and should not be used as a sole basis for treatment. Nasal washings and aspirates are unacceptable for Xpert Xpress SARS-CoV-2/FLU/RSV testing.  Fact Sheet for Patients: EntrepreneurPulse.com.au  Fact Sheet for Healthcare Providers: IncredibleEmployment.be  This test is not yet approved or cleared by the [place] FDA and has been authorized for detection and/or diagnosis of SARS-CoV-2 by FDA under an Emergency Use Authorization (EUA). This EUA will remain in effect (meaning this test can be used) for the duration of the COVID-19 declaration under Section 564(b)(1) of the Act, 21 U.S.C. section 360bbb-3(b)(1), unless the authorization is terminated or revoked.     Resp Syncytial Virus by PCR NEGATIVE NEGATIVE    Comment: (NOTE) Fact Sheet for Patients: EntrepreneurPulse.com.au  Fact Sheet for Healthcare Providers: IncredibleEmployment.be  This test is not yet approved or cleared by the [place] FDA and has been authorized for detection and/or diagnosis of SARS-CoV-2 by FDA under an Emergency Use Authorization (EUA). This EUA will remain in  effect (meaning this test can be used) for the duration of the COVID-19 declaration under Section 564(b)(1) of the Act, 21 U.S.C. section 360bbb-3(b)(1), unless the authorization is terminated or revoked.  Performed at [facility], [place] [address] [place], [place] [address]   Comprehensive metabolic panel     Status: Abnormal   Collection Time: 06/01/22 12:02 AM  Result Value Ref Range   Sodium 132 (L) 135 - 145 mmol/L   Potassium 4.3 3.5 - 5.1 mmol/L   Chloride 102 98 - 111 mmol/L   CO2 20 (L) 22 - 32 mmol/L   Glucose, Bld 122 (H) 70 - 99 mg/dL    Comment: Glucose reference range applies only to samples taken after fasting for at least 8 hours.   BUN 10 8 - 23 mg/dL   Creatinine, Ser 1.01 (H) 0.44 - 1.00 mg/dL   Calcium 8.5 (L) 8.9 - 10.3 mg/dL   Total Protein 7.0 6.5 - 8.1 g/dL   Albumin 3.7 3.5 - 5.0 g/dL   AST 34 15 - 41 U/L   ALT 35 0 - 44 U/L   Alkaline Phosphatase 152 (H) 38 - 126 U/L   Total Bilirubin 0.6 0.3 - 1.2 mg/dL   GFR, Estimated >60 >60 mL/min    Comment: (NOTE) Calculated using the CKD-EPI Creatinine Equation (2021)  Anion gap 10 5 - 15    Comment: Performed at [facility] [address] [place], [place] [address]  Magnesium     Status: None   Collection Time: 06/01/22 12:02 AM  Result Value Ref Range   Magnesium 2.2 1.7 - 2.4 mg/dL    Comment: Performed at [facility], [place] [address], [place], [place] [address]  Phosphorus     Status: None   Collection Time: 06/01/22 12:02 AM  Result Value Ref Range   Phosphorus 3.2 2.5 - 4.6 mg/dL    Comment: Performed at [facility] [address] [place], [place] [address]  Lipase, blood     Status: Abnormal   Collection Time: 06/01/22 12:02 AM  Result Value Ref Range   Lipase 57 (H) 11 - 51 U/L    Comment: Performed at [facility], [place] [address] [place], [place] [address]  Troponin I (High Sensitivity)     Status: None   Collection Time: 06/01/22 12:02 AM  Result Value  Ref Range   Troponin I (High Sensitivity) 15 <18 ng/L    Comment: (NOTE) Elevated high sensitivity troponin I (hsTnI) values and significant  changes across serial measurements may suggest ACS but many other  chronic and acute conditions are known to elevate hsTnI results.  Refer to the "Links" section for chest pain algorithms and additional  guidance. Performed at [facility], [place] [address] [place], [place] [address]   CBC with Differential/Platelet     Status: Abnormal   Collection Time: 06/01/22 12:05 AM  Result Value Ref Range   WBC 13.3 (H) 4.0 - 10.5 K/uL   RBC 4.25 3.87 - 5.11 MIL/uL   Hemoglobin 13.8 12.0 - 15.0 g/dL   HCT 41.1 36.0 - 46.0 %   MCV 96.7 80.0 - 100.0 fL   MCH 32.5 26.0 - 34.0 pg   MCHC 33.6 30.0 - 36.0 g/dL   RDW 13.0 11.5 - 15.5 %   Platelets 239 150 - 400 K/uL   nRBC 0.0 0.0 - 0.2 %   Neutrophils Relative % 73 %   Neutro Abs 9.8 (H) 1.7 - 7.7 K/uL   Lymphocytes Relative 17 %   Lymphs Abs 2.3 0.7 - 4.0 K/uL   Monocytes Relative 7 %   Monocytes Absolute 1.0 0.1 - 1.0 K/uL   Eosinophils Relative 1 %   Eosinophils Absolute 0.1 0.0 - 0.5 K/uL   Basophils Relative 1 %   Basophils Absolute 0.1 0.0 - 0.1 K/uL   Immature Granulocytes 1 %   Abs Immature Granulocytes 0.06 0.00 - 0.07 K/uL    Comment: Performed at [facility] [address] [place], [place] [address]  I-Stat venous blood gas, ED     Status: Abnormal   Collection Time: 06/01/22 12:12 AM  Result Value Ref Range   pH, Ven 7.244 (L) 7.25 - 7.43   pCO2, Ven 48.6 44 - 60 mmHg   pO2, Ven 94 (H) 32 - 45 mmHg   Bicarbonate 21.0 20.0 - 28.0 mmol/L   TCO2 22 22 - 32 mmol/L   O2 Saturation 96 %   Acid-base deficit 7.0 (H) 0.0 - 2.0 mmol/L   Sodium 134 (L) 135 - 145 mmol/L   Potassium 4.3 3.5 - 5.1 mmol/L   Calcium, Ion 1.15 1.15 - 1.40 mmol/L   HCT 42.0 36.0 - 46.0 %   Hemoglobin 14.3 12.0 - 15.0 g/dL   Sample type  VENOUS   Urinalysis, Complete w Microscopic -Urine, Clean Catch      Status: Abnormal   Collection Time: 06/01/22  2:20 AM  Result Value Ref Range   Color, Urine YELLOW YELLOW   APPearance CLEAR CLEAR   Specific Gravity, Urine >1.046 (H) 1.005 - 1.030   pH 5.0 5.0 - 8.0   Glucose, UA >=500 (A) NEGATIVE mg/dL   Hgb urine dipstick NEGATIVE NEGATIVE   Bilirubin Urine NEGATIVE NEGATIVE   Ketones, ur 5 (A) NEGATIVE mg/dL   Protein, ur NEGATIVE NEGATIVE mg/dL   Nitrite POSITIVE (A) NEGATIVE   Leukocytes,Ua NEGATIVE NEGATIVE   RBC / HPF 0-5 0 - 5 RBC/hpf   WBC, UA 0-5 0 - 5 WBC/hpf   Bacteria, UA NONE SEEN NONE SEEN   Squamous Epithelial / HPF 0-5 0 - 5 /HPF    Comment: Performed at [facility], [address], [place], [place] [address]   CT Angio Chest PE W and/or Wo Contrast  Result Date: 05/31/2022 CLINICAL DATA:  Pulmonary embolism (PE) suspected, low to intermediate prob, positive D-dimer EXAM: CT ANGIOGRAPHY CHEST WITH CONTRAST TECHNIQUE: Multidetector CT imaging of the chest was performed using the standard protocol during bolus administration of intravenous contrast. Multiplanar CT image reconstructions and MIPs were obtained to evaluate the vascular anatomy. RADIATION DOSE REDUCTION: This exam was performed according to the departmental dose-optimization program which includes automated exposure control, adjustment of the mA and/or kV according to patient size and/or use of iterative reconstruction technique. CONTRAST:  19m OMNIPAQUE IOHEXOL 350 MG/ML SOLN COMPARISON:  06/12/2021 FINDINGS: Cardiovascular: Adequate opacification of the pulmonary arterial tree. No intraluminal filling defect identified to suggest acute pulmonary embolism. Central pulmonary arteries are of normal caliber. No significant coronary artery calcification. Cardiac size within normal limits. No pericardial effusion. Mild atherosclerotic calcification within the thoracic aorta. No aortic aneurysm. Mediastinum/Nodes: No enlarged mediastinal, hilar, or axillary lymph nodes.  Thyroid gland, trachea, and esophagus demonstrate no significant findings. Lungs/Pleura: Mild emphysema. Known right lower lobe ground-glass pulmonary nodule is partially obscured by discoid atelectasis at axial image # 90/6. No new focal pulmonary nodules or infiltrates. No pneumothorax or pleural effusion. Bronchial wall thickening is present in keeping with airway inflammation. No central obstructing lesion. Upper Abdomen: Status post cholecystectomy.  No acute abnormality. Musculoskeletal: Osseous structures are age-appropriate. No acute bone abnormality. No lytic or blastic bone lesion. Review of the MIP images confirms the above findings. IMPRESSION: 1. No pulmonary embolism. No acute intrathoracic pathology identified. 2. Mild emphysema. 3. Known right lower lobe ground-glass pulmonary nodule is partially obscured by discoid atelectasis on today's examination. Re-evaluation with outpatient routine lung cancer screening CT examination is recommended for continued observation. 4. Bronchial wall thickening in keeping with airway inflammation. No central obstructing lesion. Emphysema (ICD10-J43.9). Electronically Signed   By: AFidela SalisburyM.D.   On: 05/31/2022 21:59   DG Chest Port 1 View  Result Date: 05/31/2022 CLINICAL DATA:  Left-sided chest and epigastric pain. EXAM: PORTABLE CHEST 1 VIEW COMPARISON:  Chest CT 06/12/2021 FINDINGS: The cardiomediastinal contours are normal. Hyperinflation and bronchial thickening. Pulmonary vasculature is normal. No consolidation, pleural effusion, or pneumothorax. Spurring at the right first rib costochondral junction. No acute osseous abnormalities are seen. IMPRESSION: Hyperinflation and bronchial thickening, likely related to known emphysema. No acute findings. Electronically Signed   By: MKeith RakeM.D.   On: 05/31/2022 17:30    Pending Labs [employer](From admission, onward)     Start  Ordered   06/01/22 0812  Respiratory (~20 pathogens) panel  by PCR  (Respiratory panel by PCR (~20 pathogens, ~24 hr TAT)  w precautions)  Once,   R        06/01/22 0811   06/01/22 0500  Blood gas, venous  Tomorrow morning,   R        05/31/22 2259   06/01/22 0500  Protime-INR  Tomorrow morning,   R        06/01/22 0032   06/01/22 0006  Ethanol  Add-on,   AD        06/01/22 0006            Vitals/Pain Today's Vitals   06/01/22 1015 06/01/22 1030 06/01/22 1100 06/01/22 1115  BP: (!) 107/51 (!) 114/58 96/61 (!) 130/58  Pulse: (!) 103 96 90 92  Resp: '15 10 12 11  '$ Temp:      TempSrc:      SpO2: 90% 90% 91% 92%  Weight:      Height:      PainSc:        Isolation Precautions Droplet precaution  Medications Medications  acetaminophen (TYLENOL) tablet 650 mg (has no administration in time range)    Or  acetaminophen (TYLENOL) suppository 650 mg (has no administration in time range)  melatonin tablet 3 mg (has no administration in time range)  ipratropium-albuterol (DUONEB) 0.5-2.5 (3) MG/3ML nebulizer solution 3 mL (3 mLs Nebulization Given 06/01/22 0826)  azithromycin (ZITHROMAX) 500 mg in sodium chloride 0.9 % 250 mL IVPB (0 mg Intravenous Stopped 06/01/22 0051)  ondansetron (ZOFRAN) injection 4 mg (has no administration in time range)  spiritus frumenti (ethyl alcohol) solution 1 each (has no administration in time range)  pantoprazole (PROTONIX) injection 40 mg (40 mg Intravenous Given 06/01/22 0204)  aspirin EC tablet 81 mg (81 mg Oral Given 06/01/22 0902)  atorvastatin (LIPITOR) tablet 80 mg (80 mg Oral Given 06/01/22 0901)  DULoxetine (CYMBALTA) DR capsule 20 mg (20 mg Oral Given 06/01/22 0902)  metoprolol succinate (TOPROL-XL) 24 hr tablet 100 mg (100 mg Oral Given [id number])  folic acid (FOLVITE) tablet 1 mg (1 mg Oral Given 06/01/22 0901)  multivitamin (PROSIGHT) tablet 1 tablet (1 tablet Oral Given 06/01/22 0900)  thiamine (VITAMIN B1) tablet 100 mg (100 mg Oral Given 06/01/22 0901)  nicotine (NICODERM CQ - dosed in mg/24 hours)  patch 21 mg (has no administration in time range)  arformoterol (BROVANA) nebulizer solution 15 mcg (15 mcg Nebulization Given 06/01/22 0834)  albuterol (PROVENTIL) (2.5 MG/3ML) 0.083% nebulizer solution 2.5 mg (has no administration in time range)  empagliflozin (JARDIANCE) tablet 25 mg (25 mg Oral Not Given 06/01/22 0933)  methylPREDNISolone sodium succinate (SOLU-MEDROL) 40 mg/mL injection 40 mg (has no administration in time range)  budesonide (PULMICORT) nebulizer solution 0.5 mg (has no administration in time range)  revefenacin (YUPELRI) nebulizer solution 175 mcg (175 mcg Nebulization Given 06/01/22 1043)  fluticasone (FLONASE) 50 MCG/ACT nasal spray 2 spray (2 sprays Each Nare Given 06/01/22 1042)  loratadine (CLARITIN) tablet 10 mg (10 mg Oral Given 06/01/22 1042)  morphine (PF) 4 MG/ML injection 4 mg (4 mg Intravenous Given 05/31/22 1725)  ondansetron (ZOFRAN) injection 4 mg (4 mg Intravenous Given 05/31/22 1725)  morphine (PF) 4 MG/ML injection 4 mg (4 mg Intravenous Given 05/31/22 2035)  metoCLOPramide (REGLAN) injection 10 mg (10 mg Intravenous Given 05/31/22 2028)  ipratropium-albuterol (DUONEB) 0.5-2.5 (3) MG/3ML nebulizer solution 3 mL (3 mLs Nebulization Given 05/31/22 2025)  iohexol (OMNIPAQUE)  350 MG/ML injection 75 mL (75 mLs Intravenous Contrast Given 05/31/22 2142)  methylPREDNISolone sodium succinate (SOLU-MEDROL) 125 mg/2 mL injection 125 mg (125 mg Intravenous Given 05/31/22 2303)  metoCLOPramide (REGLAN) injection 10 mg (10 mg Intravenous Given 05/31/22 2255)  ipratropium-albuterol (DUONEB) 0.5-2.5 (3) MG/3ML nebulizer solution 3 mL (3 mLs Nebulization Given 05/31/22 2256)  thiamine (VITAMIN B1) injection 100 mg (100 mg Intravenous Given 06/01/22 0208)    Mobility walks     Focused Assessments Cardiac Assessment Handoff:  Cardiac Rhythm: Sinus tachycardia Lab Results  Component Value Date   CKTOTAL 382 (H) 06/29/2010   CKMB (HH) 06/29/2010    9.9 CRITICAL VALUE NOTED.   VALUE IS CONSISTENT WITH PREVIOUSLY REPORTED AND CALLED VALUE.   TROPONINI 0.03        NO INDICATION OF MYOCARDIAL INJURY. 06/29/2010   Lab Results  Component Value Date   DDIMER 0.68 (H) 05/31/2022   Does the Patient currently have chest pain? No    R Recommendations: See Admitting Provider Note  Report given to: [id number]  Additional Notes: Pt can walk to restroom, no issues.

## 2022-06-01 NOTE — Consult Note (Signed)
NAME:  Katrina [name], MRN:  [id number], DOB:  12-Jul-1955, LOS: 1 ADMISSION DATE:  05/31/2022, CONSULTATION DATE: 2/25  REFERRING MD: Dr. Tyrell Antonio, CHIEF COMPLAINT: Chest pain  History of Present Illness:  67 year old female who presented to [name] ER on 2/24 with reports of chest pain.  Patient reports she has noted in the last week that she has had increase in postnasal drip, increased cough and felt as though she either had allergies or the beginnings of a cold.  She notes that flowers are blooming in her backyard as well as trees.  She lives at home with her family.  She has a daughter who is expecting her first grandchild.  She continues to smoke up to 1 pack/day.  Typically drinks 2-3 beers several times per week but has never had withdrawal symptoms.  The patient reports she was out on 2/25 doing her normal activities.  She went home and felt tired and decided to take a nap around 3 PM.  She had sudden onset of left chest pain under her breast that she describes as a stab and tearing pain.  This prompted ER evaluation.  On initial presentation she required 5 L O2 to maintain saturations.  She does not wear oxygen at home.  She is followed by Dr. Valeta Harms for emphysema and is on Breztri at baseline.  She was given morphine by EMS which she reports made her feel loopy and sick to her stomach.  She reports she feels like she has received morphine overnight.  Initial labs notable for **.  Telemetry showed frequent PVCs.  CTA chest was negative for pulmonary embolism or other cardiopulmonary process.  Independent review does show chronic emphysematous changes.  COVID, influenza A/B, RSV negative.  Initial ABG showed acute hypercarbic respiratory failure.  Troponin negative with no ischemic EKG changes.  Patient was admitted per [facility] for suspected COPD exacerbation.  PCCM consulted for evaluation.  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  O2 down to 3 L with sats 90 to 94%  Objective   Blood pressure 127/70, pulse (!) 108, temperature (!) 97.5 F (36.4 C), temperature source Oral, resp. rate 16, height '5\' 7"'$  (1.702 m), weight 106.1 kg, SpO2 91 %.       No intake or output data in the 24 hours ending 06/01/22 0952 Filed Weights   05/31/22 1643  Weight: 106.1 kg    Examination: General: Chronically ill-appearing adult female lying in bed in no acute distress HENT: MM pink/moist, [place] O2, anicteric, pupils 2 to 3 mm Lungs: Nonlabored at rest on  O2, bilateral wheezing L>R Cardiovascular: S1-S2 RRR, frequent PVCs on monitor/~every fourth beat Abdomen: Obese, soft, BS x 4 active Extremities: Warm, dry, trace pretibial pitting edema Neuro: AAOx4, speech clear, MAEE, nonfocal   Resolved Hospital Problem list     Assessment & Plan:   Acute Exacerbation of COPD Acute Hypoxic / Hypercarbic Respiratory Failure Tobacco  Abuse  Chest Pain  Non-cardiac chest pain, COVID/Flu/RSV negative. PE negative. Received morphine in setting of chest pain, suspect this may have contributed to hypercarbia given her body habitus. URI type symptoms vs allergies.  -assess extended respiratory viral panel  -wean O2 for sats 88-94%, suspect she may live low normal saturations given smoking hx  -smoking cessation counseling  -reduce solumedrol to 40 mg IV BID  -brovana,  pulmicort > adjust pulmicort dosing. Add yupelri  -azithromycin for AECOPD  -add flonase, claritin for allergic component / PND -nicotine patch  -avoid further opiates if possible given hypercarbia  -consider outpatient PSG  -will need close outpatient follow up after discharge with pulmonary   Best Practice (right click and "Reselect all SmartList Selections" daily)  Per [facility]  Labs   CBC: Recent Labs  Lab 05/31/22 1701 06/01/22 0005 06/01/22 0012  WBC 12.8* 13.3*  --   NEUTROABS 7.9* 9.8*  --   HGB 14.3 13.8 14.3  HCT 42.4 41.1 42.0  MCV 96.4 96.7  --   PLT 264 239  --     Basic Metabolic Panel: Recent Labs  Lab 05/31/22 1701 06/01/22 0002 06/01/22 0012  NA 132* 132* 134*  K 5.3* 4.3 4.3  CL 102 102  --   CO2 16* 20*  --   GLUCOSE 88 122*  --   BUN 12 10  --   CREATININE 0.98 1.01*  --   CALCIUM 8.6* 8.5*  --   MG  --  2.2  --   PHOS  --  3.2  --    GFR: Estimated Creatinine Clearance: 68.7 mL/min (A) (by C-G formula based on SCr of 1.01 mg/dL (H)). Recent Labs  Lab 05/31/22 1701 06/01/22 0005  WBC 12.8* 13.3*    Liver Function Tests: Recent Labs  Lab 05/31/22 1701 06/01/22 0002  AST 29 34  ALT 25 35  ALKPHOS 132* 152*  BILITOT 1.9* 0.6  PROT 6.9 7.0  ALBUMIN 3.5 3.7   Recent Labs  Lab 05/31/22 1701 06/01/22 0002  LIPASE 96* 57*   No results for input(s): "AMMONIA" in the last 168 hours.  ABG    Component Value Date/Time   PHART 7.322 (L) 06/28/2010 1615   PCO2ART 45.7 (H) 06/28/2010 1615   PO2ART 222.0 (H) 06/28/2010 1615   HCO3 21.0 06/01/2022 0012   TCO2 22 06/01/2022 0012   ACIDBASEDEF 7.0 (H) 06/01/2022 0012   O2SAT 96 06/01/2022 0012     Coagulation Profile: No results for input(s): "INR", "PROTIME" in the last 168 hours.  Cardiac Enzymes: No results for input(s): "CKTOTAL", "CKMB", "CKMBINDEX", "TROPONINI" in the last 168 hours.  HbA1C: Hgb A1c MFr Bld  Date/Time Value Ref Range Status  03/04/2021 08:21 AM 5.5 4.8 -  5.6 % Final    Comment:             Prediabetes: 5.7 - 6.4          Diabetes: >6.4          Glycemic control for adults with diabetes: <7.0     CBG: No results for input(s): "GLUCAP" in the last 168 hours.  Review of Systems: Positives in [place]   Gen: Denies fever, chills, weight change, fatigue, night sweats HEENT: Denies blurred vision, double vision, hearing loss, tinnitus, sinus congestion, rhinorrhea, sore throat, neck stiffness, dysphagia PULM: Denies shortness of breath, cough, sputum production, hemoptysis, wheezing CV: Denies chest pain under left breast, edema, orthopnea, paroxysmal nocturnal dyspnea, palpitations GI: Denies abdominal pain,  nausea, vomiting, diarrhea, hematochezia, melena, constipation, change in bowel habits GU: Denies dysuria, hematuria, polyuria, oliguria, urethral discharge Endocrine: Denies hot or cold intolerance, polyuria, polyphagia or appetite change Derm: Denies rash, dry skin, scaling or peeling skin change Heme: Denies easy bruising, bleeding, bleeding gums Neuro: Denies headache, numbness, weakness, slurred speech, loss of memory or consciousness  Past Medical History:  She,  has a past medical history of Anxiety, Arthritis, Asthma, Chronic systolic heart failure ([place]), COPD (chronic obstructive pulmonary disease) ([place]), Depression, GERD (gastroesophageal reflux disease), Headache, Left bundle branch block (06/28/2010), Lung infection (2015), and Lung infection.   Surgical History:   Past Surgical History:  Procedure Laterality Date   BACK SURGERY     x2   CARPAL TUNNEL RELEASE     left    CESAREAN SECTION     x 2   CHOLECYSTECTOMY     COLONOSCOPY  10/2017   DILATATION & CURETTAGE/HYSTEROSCOPY WITH MYOSURE N/A 12/10/2017   Procedure: DILATATION & CURETTAGE/HYSTEROSCOPY WITH MYOSURE;  Surgeon: Christophe Louis, MD;  Location: [facility];  Service: Gynecology;  Laterality: N/A;  POSSIBLE MYOMECTOMY VS POLYPECTOMY WITH MYOSURE   DILATION AND CURETTAGE  OF UTERUS     ELBOW SURGERY     JOINT REPLACEMENT     KNEE ARTHROSCOPY     bilateral x 2    KNEE ARTHROSCOPY Left 11/03/2013   Procedure: LEFT KNEE ARTHROSCOPY WITH DEBRIDEMENT, PARTIAL SYNOVECTOMY;  Surgeon: Mcarthur Rossetti, MD;  Location: [facility];  Service: Orthopedics;  Laterality: Left;   LAPAROSCOPY     left elbow surgery      OTHER SURGICAL HISTORY     arthroswcopic surgery right knee    OTHER SURGICAL HISTORY     arthroscopic surgery left knee x 2    OTHER SURGICAL HISTORY     bunionectomy right foot    OTHER SURGICAL HISTORY     C Section x 2    OTHER SURGICAL HISTORY     carpal tunnel on left    POSTERIOR CERVICAL FUSION/FORAMINOTOMY N/A 04/02/2020   Procedure: RIGHT C5-6 FORAMINOTOMY;  Surgeon: Jessy Oto, MD;  Location: [place];  Service: Orthopedics;  Laterality: N/A;   RADIOLOGY WITH ANESTHESIA N/A 07/28/2019   Procedure: MRI WITH ANESTHESIA OF NECK SOFT TISSUE ONLY WITH AND WITHOUT CONTRAST;  Surgeon: Radiologist, Medication, MD;  Location: [place];  Service: Radiology;  Laterality: N/A;   RADIOLOGY WITH ANESTHESIA N/A 10/04/2019   Procedure: MRI WITH ANESTHESIA RIGHT SHOULDER WITHOUT CONTRAST,MRI OF CERVICAL SPINE WITHOUT CONTRAST;  Surgeon: Radiologist, Medication, MD;  Location: [place];  Service: Radiology;  Laterality: N/A;   RADIOLOGY WITH ANESTHESIA N/A 08/30/2020   Procedure: MRI WITH ANESTHESIA CERVICAL SPINE WITHOUT CONTRAST;  Surgeon: Radiologist, Medication, MD;  Location: [place];  Service: Radiology;  Laterality: N/A;   right foot bunionectomy      RIGHT/LEFT HEART CATH AND CORONARY ANGIOGRAPHY N/A 04/26/2021   Procedure: RIGHT/LEFT HEART CATH AND CORONARY ANGIOGRAPHY;  Surgeon: Early Osmond, MD;  Location: [place] [facility];  Service: Cardiovascular;  Laterality: N/A;   ROBOTIC ASSISTED TOTAL HYSTERECTOMY WITH BILATERAL SALPINGO OOPHERECTOMY Bilateral 06/16/2018   Procedure: XI ROBOTIC ASSISTED TOTAL HYSTERECTOMY WITH RIGHT SALPINGO OOPHORECTOMY;  Surgeon:  Christophe Louis, MD;  Location: [place];  Service: Gynecology;  Laterality: Bilateral;   TOTAL HIP ARTHROPLASTY  05/02/2011   Procedure: TOTAL HIP ARTHROPLASTY ANTERIOR APPROACH;  Surgeon: Mcarthur Rossetti, MD;  Location: [facility];  Service: Orthopedics;  Laterality: Left;  Left Total  Hip Arthroplasty, Direct Anterior Approach   TOTAL KNEE ARTHROPLASTY Right 12/03/2012   Procedure: RIGHT TOTAL KNEE ARTHROPLASTY;  Surgeon: Mcarthur Rossetti, MD;  Location: [facility];  Service: Orthopedics;  Laterality: Right;   TOTAL KNEE ARTHROPLASTY Left 12/15/2014   Procedure: LEFT TOTAL KNEE ARTHROPLASTY;  Surgeon: Mcarthur Rossetti, MD;  Location: [facility];  Service: Orthopedics;  Laterality: Left;   TUBAL LIGATION     ULNAR NERVE TRANSPOSITION     left    UPPER GI ENDOSCOPY     x 2     Social History:   reports that she has been smoking cigarettes. She has a 25.00 pack-year smoking history. She has never used smokeless tobacco. She reports current alcohol use of about 49.0 standard drinks of alcohol per week. She reports that she does not use drugs.   Family History:  Her family history includes Depression in her brother; Diabetes in her brother; Heart disease in her mother; High Cholesterol in her mother; High blood pressure in her mother.   Allergies Allergies  Allergen Reactions   Codeine Itching and Nausea Only     Home Medications  Prior to Admission medications   Medication Sig Start Date End Date Taking? Authorizing Provider  acetaminophen (TYLENOL) 500 MG tablet Take 1,000 mg by mouth every 6 (six) hours as needed for moderate pain.   Yes [provider]  aspirin EC 81 MG tablet Take 1 tablet (81 mg total) by mouth daily. Swallow whole. 03/21/21  Yes Early Osmond, MD  atorvastatin (LIPITOR) 80 MG tablet Take 1 tablet (80 mg total) by mouth daily. 08/22/21  Yes Early Osmond, MD  Budeson-Glycopyrrol-Formoterol (BREZTRI AEROSPHERE) 160-9-4.8 MCG/ACT AERO  Inhale 2 puffs into the lungs in the morning and at bedtime. 07/01/21  Yes Icard, Bradley L, DO  diclofenac Sodium (VOLTAREN ARTHRITIS PAIN) 1 % GEL Apply 2 g topically 4 (four) times daily as needed (pain).   Yes [provider]  DULoxetine (CYMBALTA) 20 MG capsule Take 1 capsule (20 mg total) by mouth daily. 05/02/22 05/02/23 Yes Jennye Boroughs, MD  empagliflozin (JARDIANCE) 25 MG TABS tablet Take 1 tablet (25 mg total) by mouth daily before breakfast. 07/26/21  Yes Early Osmond, MD  losartan (COZAAR) 50 MG tablet Take 1 tablet (50 mg total) by mouth daily. 05/02/22  Yes Early Osmond, MD  metoprolol succinate (TOPROL-XL) 100 MG 24 hr tablet Take 1 tablet (100 mg total) by mouth daily. Take with or immediately following a meal. 01/15/22  Yes Early Osmond, MD  spironolactone (ALDACTONE) 25 MG tablet Take 1 tablet (25 mg total) by mouth at bedtime. 05/26/22  Yes Early Osmond, MD  clonazePAM (KLONOPIN) 0.5 MG tablet Take 1 tablet (0.5 mg total) by mouth 2 (two) times daily as needed for anxiety. 08/24/19 02/22/20  Jessy Oto, MD     Critical care time: n/a    Noe Gens, MSN, APRN, NP-C, AGACNP-BC [facility] 06/01/2022, 9:52 AM   Please see [email or web address] for pager details.   From 7A-7P if no response, please call [phone] After hours, please call ELink [phone]

## 2022-06-01 NOTE — Progress Notes (Signed)
PROGRESS NOTE    Katrina [name]  [id number] DOB: [birth year] DOA: 05/31/2022 PCP: Ronnell Freshwater, NP   Brief Narrative: 62 PMH COPD. Chronic Systolic heart failure [id number] % , Left Bundle branch block, HLD, chronic alcohol abuse, presented with acute COPD exacerbation. Patient presenting with 1 day progressive shortness of breath associated with cough and wheezing. Patient was found to be hypoxic oxygen saturation 80 on room air required 2 L to increase oxygen saturation to 93 to 96%. Found to have hyperkalemia, hyperbilirubinemia, D-dimer 0.6.  COVID influenza RSV negative.  Leukocytosis. CT chest negative for PE or any infection or acute cardiopulmonary process.  Patient receives IV Solu-Medrol, Reglan and morphine in the ED.  Admitted with COPD exacerbation.    Assessment & Plan:   Principal Problem:   Acute exacerbation of chronic obstructive pulmonary disease (COPD) (HCC) Active Problems:   Tobacco abuse   Hyperlipidemia   Chronic systolic CHF (congestive heart failure) (HCC)   Acute hypoxic respiratory failure (HCC)   Elevated troponin   Hyperkalemia   SIRS (systemic inflammatory response syndrome) (HCC)   Epigastric pain   Chronic alcohol abuse  1-Acute COPD exacerbation, Acute hypoxic respiratory failure and distress: -Patient continues to smoke, she report cough, shortness of breath -She does not use home oxygen.  She is requiring 5 L of oxygen to keep oxygen saturation 90%. She is mildly tachypneic. -Continue with IV Solu-Medrol twice daily, continue with the scheduled DuoNeb. -Started on Yupelri, Pulmicort and [place]  consulted, for further evaluation of acute hypoxic respiratory failure -CT angio negative for PE.  Mild emphysema.  No right lower lobe groundglass pulmonary nodule.  Follow-up with screening CTA as an outpatient -Plan to check respiratory panel.  COVID RSV and influenza negative. -BNP normal -Continue  with azithromycin  Mild  elevation of troponin: In the setting of acute hypoxic respiratory failure.  Very mildly elevation troponin at 21  Hyperkalemia: Resolved Holding spironolactone.   SIRS;  In the setting of #1, continue with azithromycin  Epigastric Discomfort.  Chest pain Mild elevation lipase.  Start PPI.  No Significant elevation of troponin, CT angio negative for PE.  Chronic Systolic heart failure: Ejection fraction 35 to 40% Euvolemic.  BNP normal.  Holding spironolactone and cozaar due to hyperkalemia.  Monitor K, resume meds when no further hyperkalemia.         Estimated body mass index is 36.65 kg/m as calculated from the following:   Height as of this encounter: '5\' 7"'$  (1.702 m).   Weight as of this encounter: 106.1 kg.   DVT prophylaxis: Start Lovenox Code Status: Full code Family Communication: Care discussed with patient.  Disposition Plan:  Status is: Inpatient Remains inpatient appropriate because: management of resp failure    Consultants:  Pulmonologist   Procedures:    Antimicrobials:    Subjective: See report shortness of breath slightly improved.  Ports cough. She reports chest pain when she takes deep breath.  Objective: Vitals:   06/01/22 0100 06/01/22 0121 06/01/22 0432 06/01/22 0516  BP: 139/80  132/75 113/63  Pulse: 90  (!) 101 (!) 103  Resp: (!) '6  13 13  '$ Temp:  98 F (36.7 C)  97.9 F (36.6 C)  TempSrc:    Oral  SpO2: 92%  91% 92%  Weight:      Height:       No intake or output data in the 24 hours ending 06/01/22 0712 Filed Weights   05/31/22 1643  Weight: 106.1 kg  Examination:  General exam: Appears calm and comfortable  Respiratory system: Decreased breath sounds, tachypnea Cardiovascular system: S1 & S2 heard, RRR. No JVD, murmurs, rubs, gallops or clicks. No pedal edema. Gastrointestinal system: Abdomen is nondistended, soft and nontender. No organomegaly or masses felt. Normal bowel sounds heard. Central nervous  system: Alert and oriented. No focal neurological deficits. Extremities: Symmetric 5 x 5 power. Skin: No rashes, lesions or ulcers Psychiatry: Judgement and insight appear normal. Mood & affect appropriate.     Data Reviewed: I have personally reviewed following labs and imaging studies  CBC: Recent Labs  Lab 05/31/22 1701 06/01/22 0005 06/01/22 0012  WBC 12.8* 13.3*  --   NEUTROABS 7.9* 9.8*  --   HGB 14.3 13.8 14.3  HCT 42.4 41.1 42.0  MCV 96.4 96.7  --   PLT 264 239  --    Basic Metabolic Panel: Recent Labs  Lab 05/31/22 1701 06/01/22 0002 06/01/22 0012  NA 132* 132* 134*  K 5.3* 4.3 4.3  CL 102 102  --   CO2 16* 20*  --   GLUCOSE 88 122*  --   BUN 12 10  --   CREATININE 0.98 1.01*  --   CALCIUM 8.6* 8.5*  --   MG  --  2.2  --   PHOS  --  3.2  --    GFR: Estimated Creatinine Clearance: 68.7 mL/min (A) (by C-G formula based on SCr of 1.01 mg/dL (H)). Liver Function Tests: Recent Labs  Lab 05/31/22 1701 06/01/22 0002  AST 29 34  ALT 25 35  ALKPHOS 132* 152*  BILITOT 1.9* 0.6  PROT 6.9 7.0  ALBUMIN 3.5 3.7   Recent Labs  Lab 05/31/22 1701 06/01/22 0002  LIPASE 96* 57*   No results for input(s): "AMMONIA" in the last 168 hours. Coagulation Profile: No results for input(s): "INR", "PROTIME" in the last 168 hours. Cardiac Enzymes: No results for input(s): "CKTOTAL", "CKMB", "CKMBINDEX", "TROPONINI" in the last 168 hours. BNP (last 3 results) No results for input(s): "PROBNP" in the last 8760 hours. HbA1C: No results for input(s): "HGBA1C" in the last 72 hours. CBG: No results for input(s): "GLUCAP" in the last 168 hours. Lipid Profile: No results for input(s): "CHOL", "HDL", "LDLCALC", "TRIG", "CHOLHDL", "LDLDIRECT" in the last 72 hours. Thyroid Function Tests: No results for input(s): "TSH", "T4TOTAL", "FREET4", "T3FREE", "THYROIDAB" in the last 72 hours. Anemia Panel: No results for input(s): "VITAMINB12", "FOLATE", "FERRITIN", "TIBC", "IRON",  "RETICCTPCT" in the last 72 hours. Sepsis Labs: No results for input(s): "PROCALCITON", "LATICACIDVEN" in the last 168 hours.  Recent Results (from the past 240 hour(s))  Resp panel by RT-PCR (RSV, Flu A&B, Covid) Anterior Nasal Swab     Status: None   Collection Time: 05/31/22 10:33 PM   Specimen: Anterior Nasal Swab  Result Value Ref Range Status   SARS Coronavirus 2 by RT PCR NEGATIVE NEGATIVE Final   Influenza A by PCR NEGATIVE NEGATIVE Final   Influenza B by PCR NEGATIVE NEGATIVE Final    Comment: (NOTE) The Xpert Xpress SARS-CoV-2/FLU/RSV plus assay is intended as an aid in the diagnosis of influenza from Nasopharyngeal swab specimens and should not be used as a sole basis for treatment. Nasal washings and aspirates are unacceptable for Xpert Xpress SARS-CoV-2/FLU/RSV testing.  Fact Sheet for Patients: EntrepreneurPulse.com.au  Fact Sheet for Healthcare Providers: IncredibleEmployment.be  This test is not yet approved or cleared by the [place] FDA and has been authorized for detection and/or diagnosis of SARS-CoV-2 by  FDA under an Emergency Use Authorization (EUA). This EUA will remain in effect (meaning this test can be used) for the duration of the COVID-19 declaration under Section 564(b)(1) of the Act, 21 U.S.C. section 360bbb-3(b)(1), unless the authorization is terminated or revoked.     Resp Syncytial Virus by PCR NEGATIVE NEGATIVE Final    Comment: (NOTE) Fact Sheet for Patients: EntrepreneurPulse.com.au  Fact Sheet for Healthcare Providers: IncredibleEmployment.be  This test is not yet approved or cleared by the [place] FDA and has been authorized for detection and/or diagnosis of SARS-CoV-2 by FDA under an Emergency Use Authorization (EUA). This EUA will remain in effect (meaning this test can be used) for the duration of the COVID-19 declaration under Section 564(b)(1) of  the Act, 21 U.S.C. section 360bbb-3(b)(1), unless the authorization is terminated or revoked.  Performed at [facility], [place] [address] [place], [place] [address]          Radiology Studies: CT Angio Chest PE W and/or Wo Contrast  Result Date: 05/31/2022 CLINICAL DATA:  Pulmonary embolism (PE) suspected, low to intermediate prob, positive D-dimer EXAM: CT ANGIOGRAPHY CHEST WITH CONTRAST TECHNIQUE: Multidetector CT imaging of the chest was performed using the standard protocol during bolus administration of intravenous contrast. Multiplanar CT image reconstructions and MIPs were obtained to evaluate the vascular anatomy. RADIATION DOSE REDUCTION: This exam was performed according to the departmental dose-optimization program which includes automated exposure control, adjustment of the mA and/or kV according to patient size and/or use of iterative reconstruction technique. CONTRAST:  20m OMNIPAQUE IOHEXOL 350 MG/ML SOLN COMPARISON:  06/12/2021 FINDINGS: Cardiovascular: Adequate opacification of the pulmonary arterial tree. No intraluminal filling defect identified to suggest acute pulmonary embolism. Central pulmonary arteries are of normal caliber. No significant coronary artery calcification. Cardiac size within normal limits. No pericardial effusion. Mild atherosclerotic calcification within the thoracic aorta. No aortic aneurysm. Mediastinum/Nodes: No enlarged mediastinal, hilar, or axillary lymph nodes. Thyroid gland, trachea, and esophagus demonstrate no significant findings. Lungs/Pleura: Mild emphysema. Known right lower lobe ground-glass pulmonary nodule is partially obscured by discoid atelectasis at axial image # 90/6. No new focal pulmonary nodules or infiltrates. No pneumothorax or pleural effusion. Bronchial wall thickening is present in keeping with airway inflammation. No central obstructing lesion. Upper Abdomen: Status post cholecystectomy.  No acute abnormality.  Musculoskeletal: Osseous structures are age-appropriate. No acute bone abnormality. No lytic or blastic bone lesion. Review of the MIP images confirms the above findings. IMPRESSION: 1. No pulmonary embolism. No acute intrathoracic pathology identified. 2. Mild emphysema. 3. Known right lower lobe ground-glass pulmonary nodule is partially obscured by discoid atelectasis on today's examination. Re-evaluation with outpatient routine lung cancer screening CT examination is recommended for continued observation. 4. Bronchial wall thickening in keeping with airway inflammation. No central obstructing lesion. Emphysema (ICD10-J43.9). Electronically Signed   By: AFidela SalisburyM.D.   On: 05/31/2022 21:59   DG Chest Port 1 View  Result Date: 05/31/2022 CLINICAL DATA:  Left-sided chest and epigastric pain. EXAM: PORTABLE CHEST 1 VIEW COMPARISON:  Chest CT 06/12/2021 FINDINGS: The cardiomediastinal contours are normal. Hyperinflation and bronchial thickening. Pulmonary vasculature is normal. No consolidation, pleural effusion, or pneumothorax. Spurring at the right first rib costochondral junction. No acute osseous abnormalities are seen. IMPRESSION: Hyperinflation and bronchial thickening, likely related to known emphysema. No acute findings. Electronically Signed   By: MKeith RakeM.D.   On: 05/31/2022 17:30        Scheduled Meds:  aspirin EC  81 mg  Oral Daily   atorvastatin  80 mg Oral Daily   DULoxetine  20 mg Oral Daily   folic acid  1 mg Oral Daily   ipratropium-albuterol  3 mL Nebulization Q6H   methylPREDNISolone (SOLU-MEDROL) injection  80 mg Intravenous Q12H   metoprolol succinate  100 mg Oral Daily   multivitamin  1 tablet Oral Daily   pantoprazole (PROTONIX) IV  40 mg Intravenous QHS   spiritus frumenti  1 each Oral BID WC   thiamine  100 mg Oral Daily   Continuous Infusions:  azithromycin Stopped (06/01/22 0051)     LOS: 1 day    Time spent: 35 minutes.     Elmarie Shiley, MD [facility]   If 7PM-7AM, please contact night-coverage [email or web address]  06/01/2022, 7:12 AM

## 2022-06-02 ENCOUNTER — Telehealth: Payer: Self-pay | Admitting: Acute Care

## 2022-06-02 DIAGNOSIS — J441 Chronic obstructive pulmonary disease with (acute) exacerbation: Secondary | ICD-10-CM | POA: Diagnosis not present

## 2022-06-02 LAB — BASIC METABOLIC PANEL
Anion gap: 8 (ref 5–15)
BUN: 24 mg/dL — ABNORMAL HIGH (ref 8–23)
CO2: 21 mmol/L — ABNORMAL LOW (ref 22–32)
Calcium: 8.7 mg/dL — ABNORMAL LOW (ref 8.9–10.3)
Chloride: 105 mmol/L (ref 98–111)
Creatinine, Ser: 0.99 mg/dL (ref 0.44–1.00)
GFR, Estimated: >60 mL/min (ref >60)
Glucose, Bld: 141 mg/dL — ABNORMAL HIGH (ref 70–99)
Potassium: 4.5 mmol/L (ref 3.5–5.1)
Sodium: 134 mmol/L — ABNORMAL LOW (ref 135–145)

## 2022-06-02 LAB — CBC
HCT: 38.6 % (ref 36.0–46.0)
Hemoglobin: 12.4 g/dL (ref 12.0–15.0)
MCH: 31.9 pg (ref 26.0–34.0)
MCHC: 32.1 g/dL (ref 30.0–36.0)
MCV: 99.2 fL (ref 80.0–100.0)
Platelets: 223 10*3/uL (ref 150–400)
RBC: 3.89 MIL/uL (ref 3.87–5.11)
RDW: 13.1 % (ref 11.5–15.5)
WBC: 20.2 10*3/uL — ABNORMAL HIGH (ref 4.0–10.5)
nRBC: 0 % (ref 0.0–0.2)

## 2022-06-02 LAB — MAGNESIUM: Magnesium: 2.5 mg/dL — ABNORMAL HIGH (ref 1.7–2.4)

## 2022-06-02 MED ORDER — SPIRONOLACTONE 25 MG PO TABS
25.0000 mg | ORAL_TABLET | Freq: Every day | ORAL | Status: DC
  Administered 2022-06-02 – 2022-06-03 (×2): 25 mg via ORAL
  Filled 2022-06-02 (×2): qty 1

## 2022-06-02 MED ORDER — GUAIFENESIN ER 600 MG PO TB12
1200.0000 mg | ORAL_TABLET | Freq: Two times a day (BID) | ORAL | Status: DC
  Administered 2022-06-02 – 2022-06-03 (×2): 1200 mg via ORAL
  Filled 2022-06-02 (×2): qty 2

## 2022-06-02 NOTE — Evaluation (Addendum)
Physical Therapy Evaluation Patient Details Name: Katrina [name] MRN: [id number] DOB: 10-13-1955 Today's Date: 06/02/2022  History of Present Illness  Katrina [name] is a 67 y.o. female with medical history significant for COPD, chronic systolic heart failure, chronic left bundle branch block, hyperlipidemia, chronic alcohol abuse, who is admitted to [facility] on 05/31/2022 with acute COPD exacerbation after presenting from home to [facility] ED complaining of shortness of breath  Clinical Impression  Pt tolerated treatment well today. Pt was able to ambulate in the hallway with RW at supervision level. Pt is highly motivated to return home and would benefit from continued gait training with progression to stair training during acute stay. Pt would also benefit from mobility referral. Recommend HHPT and PT will continue to follow.       Recommendations for follow up therapy are one component of a multi-disciplinary discharge planning process, led by the attending physician.  Recommendations may be updated based on patient status, additional functional criteria and insurance authorization.  Follow Up Recommendations Home health PT      Assistance Recommended at Discharge Intermittent Supervision/Assistance  Patient can return home with the following  A little help with walking and/or transfers;Assistance with cooking/housework;Assistance with feeding;Help with stairs or ramp for entrance    Equipment Recommendations None recommended by PT  Recommendations for Other Services       Functional Status Assessment Patient has had a recent decline in their functional status and demonstrates the ability to make significant improvements in function in a reasonable and predictable amount of time.     Precautions / Restrictions Precautions Precautions: Fall Restrictions Weight Bearing Restrictions: No      Mobility  Bed Mobility Overal bed mobility: Independent                   Transfers Overall transfer level: Modified independent Equipment used: Rolling walker (2 wheels)                    Ambulation/Gait Ambulation/Gait assistance: Supervision Gait Distance (Feet): 350 Feet Assistive device: Rolling walker (2 wheels) Gait Pattern/deviations: WFL(Within Functional Limits) Gait velocity: decreased     General Gait Details: Pt does not use AD at baseline and likely could progress back to her baseline.  Stairs            Wheelchair Mobility    Modified Rankin (Stroke Patients Only)       Balance Overall balance assessment: No apparent balance deficits (not formally assessed)                                           Pertinent Vitals/Pain Pain Assessment Pain Assessment: 0-10 Pain Score: 4  Pain Location: Low back Pain Descriptors / Indicators: Constant Pain Intervention(s): Monitored during session    Home Living Family/patient expects to be discharged to:: Private residence Living Arrangements: Spouse/significant other;Children Available Help at Discharge: Family Type of Home: House Home Access: Stairs to enter Entrance Stairs-Rails: None (Uses Garbage cans on either side to stabilize) Entrance Stairs-Number of Steps: 3   Home Layout: One level Home Equipment: Shower seat - built Medical sales representative (2 wheels);Rollator (4 wheels);Cane - single point;BSC/3in1;Crutches;Grab bars - tub/shower;Wheelchair - manual;Hospital bed      Prior Function Prior Level of Function : Needs assist       Physical Assist : ADLs (physical)   ADLs (physical):  IADLs;Bathing;Grooming   ADLs Comments:  (States that she has to sit if standing for long periods of time due to back pain)     Hand Dominance   Dominant Hand: Right    Extremity/Trunk Assessment   Upper Extremity Assessment Upper Extremity Assessment: Overall WFL for tasks assessed    Lower Extremity Assessment Lower Extremity Assessment: Overall WFL  for tasks assessed       Communication   Communication: No difficulties  Cognition Arousal/Alertness: Awake/alert Behavior During Therapy: WFL for tasks assessed/performed Overall Cognitive Status: Within Functional Limits for tasks assessed                                          General Comments General comments (skin integrity, edema, etc.): VSS stable during ambulation. 92% on 3L and HR up to 110bpm during ambulation    Exercises     Assessment/Plan    PT Assessment Patient needs continued PT services  PT Problem List Decreased activity tolerance;Decreased mobility;Decreased safety awareness;Cardiopulmonary status limiting activity       PT Treatment Interventions Gait training;Functional mobility training;Patient/family education    PT Goals (Current goals can be found in the Care Plan section)  Acute Rehab PT Goals Patient Stated Goal: To go home so I can see my grandbaby being born PT Goal Formulation: With patient Time For Goal Achievement: 06/16/22 Potential to Achieve Goals: Good    Frequency Min 3X/week     Co-evaluation               AM-PAC PT "6 Clicks" Mobility  Outcome Measure Help needed turning from your back to your side while in a flat bed without using bedrails?: None Help needed moving from lying on your back to sitting on the side of a flat bed without using bedrails?: None Help needed moving to and from a bed to a chair (including a wheelchair)?: None Help needed standing up from a chair using your arms (e.g., wheelchair or bedside chair)?: None Help needed to walk in hospital room?: None Help needed climbing 3-5 steps with a railing? : A Little 6 Click Score: 23    End of Session Equipment Utilized During Treatment: Gait belt;Oxygen Activity Tolerance: Patient tolerated treatment well Patient left: in bed;with call bell/phone within reach Nurse Communication: Mobility status PT Visit Diagnosis: Other abnormalities  of gait and mobility (R26.89)    Time: [id number] PT Time Calculation (min) (ACUTE ONLY): 31 min   Charges:   PT Evaluation $PT Eval Moderate Complexity: 1 Mod PT Treatments $Gait Training: 8-22 mins $Therapeutic Activity: 8-22 mins        Shelby Mattocks, PT, DPT Acute Rehab Services [id number]   Viann Shove 06/02/2022, 1:32 PM

## 2022-06-02 NOTE — Consult Note (Signed)
NAME:  Katrina [name], MRN:  [id number], DOB:  1955/05/15, LOS: 2 ADMISSION DATE:  05/31/2022, CONSULTATION DATE: 2/25  REFERRING MD: Dr. Tyrell Antonio, CHIEF COMPLAINT: Chest pain  History of Present Illness:  67 year old female who presented to [name], ER on 2/24 with reports of chest pain.  Patient reports she has noted in the last week that she has had increase in postnasal drip, increased cough and felt as though she either had allergies or the beginnings of a cold.  She notes that flowers are blooming in her backyard as well as trees.  She lives at home with her family.  She has a daughter who is expecting her first grandchild.  She continues to smoke up to 1 pack/day.  Typically drinks 2-3 beers several times per week but has never had withdrawal symptoms.  The patient reports she was out on 2/25 doing her normal activities.  She went home and felt tired and decided to take a nap around 3 PM.  She had sudden onset of left chest pain under her breast that she describes as a stab and tearing pain.  This prompted ER evaluation.  On initial presentation she required 5 L O2 to maintain saturations.  She does not wear oxygen at home.  She is followed by Dr. Valeta Harms for emphysema and is on Breztri at baseline.  She was given morphine by EMS which she reports made her feel loopy and sick to her stomach.  She reports she feels like she has received morphine overnight.  Telemetry showed frequent PVCs.  CTA chest was negative for pulmonary embolism or other cardiopulmonary process.  Independent review does show chronic emphysematous changes.  COVID, influenza A/B, RSV negative.  Initial ABG showed acute hypercarbic respiratory failure.  Troponin negative with no ischemic EKG changes.  Patient was admitted per [facility] for suspected COPD exacerbation.  PCCM consulted for evaluation.  Pertinent  Medical History  Anxiety Depression Chronic alcohol abuse-reports that she drinks 2-3 beers multiple times per  week Arthritis Asthma COPD-mixed obstructive and restrictive components on PFTs, air trapping Tobacco abuse-began smoking at age 7, had a 4-year period of cessation, at her heaviest smoked up to 2 packs/day, currently smoking 1 pack/day GERD Left bundle branch block Chronic systolic heart failure-LVEF 35 to 40%  Significant Hospital Events: Including procedures, antibiotic start and stop dates in addition to other pertinent events   2/24 Admit 2/25 PCCM consulted 2/26 No acute issues overnight, remains on 2L [place]  Interim History / Subjective:  States she feels well this, complains of inability to produce sputum   Objective   Blood pressure 121/64, pulse (!) 101, temperature 97.9 F (36.6 C), temperature source Oral, resp. rate 19, height '5\' 7"'$  (1.702 m), weight 102.9 kg, SpO2 92 %.        Intake/Output Summary (Last 24 hours) at 06/02/2022 1105 Last data filed at 06/02/2022 0900 Gross per 24 hour  Intake 730.37 ml  Output --  Net 730.37 ml   Filed Weights   05/31/22 1643 06/02/22 0531  Weight: 106.1 kg 102.9 kg    Examination: General: Acute on chronically ill appearing middle aged female lying in bed, in NAD HEENT: [place]/AT, MM pink/moist, PERRL,  Neuro: Alert and oriented x3, non-focal CV: s1s2 regular rate and rhythm, no murmur, rubs, or gallops,  PULM:  Slightly diminished bilaterally, no increased work of breathing, on 2L [place] GI: soft, bowel sounds active in all 4 quadrants, non-tender, non-distended, tolerating oral diet Extremities: warm/dry, no edema  Skin: no rashes or lesions   Resolved Hospital Problem list     Assessment & Plan:   Acute Exacerbation of COPD Acute Hypoxic / Hypercarbic Respiratory Failure Tobacco Abuse  Chest Pain  -Non-cardiac chest pain, COVID/Flu/RSV negative. PE negative. Received morphine in setting of chest pain, suspect this may have contributed to hypercarbia given her body habitus. URI type symptoms vs allergies. RVP negative   P: Continue supplemental oxygen for SPO2 goal 88-94% Wean oxygen as able Continue IV solumedrol '40mg'$  BID  Continue [place], Pulmicort, and Yupelri Continue with Flonase and Claritin for allergy component and management of postnasal drip  Continue short course of Azithromycin for COPD flare Nicotine patch  Smoking cessation education  Avoid sedating medications Will need outpatient pulmonary follow up    Best Practice (right click and "Reselect all SmartList Selections" daily)  Per [facility]   Critical care time: n/a  Demaryius Imran D. Harris, NP-C [facility] Personal contact information can be found on Amion  If no contact or response made please call [phone] 06/02/2022, 11:07 AM

## 2022-06-02 NOTE — Progress Notes (Addendum)
PROGRESS NOTE    Katrina [name]  [id number] DOB: [birth year] DOA: 05/31/2022 PCP: Ronnell Freshwater, NP   Brief Narrative: 50 PMH COPD. Chronic Systolic heart failure [id number] % , Left Bundle branch block, HLD, chronic alcohol abuse, presented with acute COPD exacerbation. Patient presenting with 1 day progressive shortness of breath associated with cough and wheezing. Patient was found to be hypoxic oxygen saturation 80 on room air required 2 L to increase oxygen saturation to 93 to 96%. Found to have hyperkalemia, hyperbilirubinemia, D-dimer 0.6.  COVID influenza RSV negative.  Leukocytosis. CT chest negative for PE or any infection or acute cardiopulmonary process.  Patient receives IV Solu-Medrol, Reglan and morphine in the ED.  Admitted with COPD exacerbation.    Assessment & Plan:   Principal Problem:   Acute exacerbation of chronic obstructive pulmonary disease (COPD) (HCC) Active Problems:   Tobacco abuse   Hyperlipidemia   Chronic systolic CHF (congestive heart failure) (HCC)   Acute hypoxic respiratory failure (HCC)   Elevated troponin   Hyperkalemia   SIRS (systemic inflammatory response syndrome) (HCC)   Epigastric pain   Chronic alcohol abuse  1-Acute COPD exacerbation, Acute hypoxic respiratory failure and distress: -Patient continues to smoke, she report cough, shortness of breath -She does not use home oxygen.  She required 5 L of oxygen to keep oxygen saturation 90% on admission.  -Continue with IV Solu-Medrol twice daily, continue with the scheduled DuoNeb. -Started on Yupelri, Pulmicort and [place]  consulted, for further evaluation of acute hypoxic respiratory failure -CT angio negative for PE.  Mild emphysema.  No right lower lobe groundglass pulmonary nodule.  Follow-up with screening CTA as an outpatient -Respiratory panel negative.  COVID RSV and influenza negative. -BNP normal -Continue  with azithromycin Oxygen requirement improved,  down to 3 L oxygen.   Mild elevation of troponin: In the setting of acute hypoxic respiratory failure.  Very mildly elevation troponin at 21  Hyperkalemia: Resolved Holding cozaar.  Monitor k , plan to resume spironolactone. Marland Kitchen   SIRS;  In the setting of #1, continue with azithromycin  Epigastric Discomfort.  Chest pain Mild elevation lipase.  Continue with PPI.  No Significant elevation of troponin, CT angio negative for PE.  Chronic Systolic heart failure: Ejection fraction 35 to 40% Euvolemic.  BNP normal.  Will resume spironolactone, monitor K   Obesity; needs life style modification       Estimated body mass index is 35.53 kg/m as calculated from the following:   Height as of this encounter: '5\' 7"'$  (1.702 m).   Weight as of this encounter: 102.9 kg.   DVT prophylaxis: Start Lovenox Code Status: Full code Family Communication: Care discussed with patient.  Disposition Plan:  Status is: Inpatient Remains inpatient appropriate because: management of resp failure    Consultants:  Pulmonologist   Procedures:    Antimicrobials:    Subjective: She is breathing better. Cough is dry now.   Objective: Vitals:   06/02/22 0304 06/02/22 0531 06/02/22 0721 06/02/22 0811  BP:  (!) 118/47  121/64  Pulse:  89  (!) 101  Resp:  18  19  Temp:  98.1 F (36.7 C)  97.9 F (36.6 C)  TempSrc:  Oral  Oral  SpO2: 95% 95% 93% 92%  Weight:  102.9 kg    Height:        Intake/Output Summary (Last 24 hours) at 06/02/2022 1407 Last data filed at 06/02/2022 0900 Gross per 24 hour  Intake 730.37 ml  Output --  Net 730.37 ml   Filed Weights   05/31/22 1643 06/02/22 0531  Weight: 106.1 kg 102.9 kg    Examination:  General exam: NAD Respiratory system: BL expiratory wheezing.  Cardiovascular system: S1, S 2 RRR Gastrointestinal system: BS present, soft, nt Central nervous system: Alert Extremities: no edema   Data Reviewed: I have personally reviewed following  labs and imaging studies  CBC: Recent Labs  Lab 05/31/22 1701 06/01/22 0005 06/01/22 0012 06/02/22 0135  WBC 12.8* 13.3*  --  20.2*  NEUTROABS 7.9* 9.8*  --   --   HGB 14.3 13.8 14.3 12.4  HCT 42.4 41.1 42.0 38.6  MCV 96.4 96.7  --  99.2  PLT 264 239  --  [id number]    Basic Metabolic Panel: Recent Labs  Lab 05/31/22 1701 06/01/22 0002 06/01/22 0012 06/02/22 0132 06/02/22 0135  NA 132* 132* 134*  --  134*  K 5.3* 4.3 4.3  --  4.5  CL 102 102  --   --  105  CO2 16* 20*  --   --  21*  GLUCOSE 88 122*  --   --  141*  BUN 12 10  --   --  24*  CREATININE 0.98 1.01*  --   --  0.99  CALCIUM 8.6* 8.5*  --   --  8.7*  MG  --  2.2  --  2.5*  --   PHOS  --  3.2  --   --   --     GFR: Estimated Creatinine Clearance: 68.9 mL/min (by C-G formula based on SCr of 0.99 mg/dL). Liver Function Tests: Recent Labs  Lab 05/31/22 1701 06/01/22 0002  AST 29 34  ALT 25 35  ALKPHOS 132* 152*  BILITOT 1.9* 0.6  PROT 6.9 7.0  ALBUMIN 3.5 3.7    Recent Labs  Lab 05/31/22 1701 06/01/22 0002  LIPASE 96* 57*    No results for input(s): "AMMONIA" in the last 168 hours. Coagulation Profile: Recent Labs  Lab 06/01/22 1432  INR 1.0   Cardiac Enzymes: No results for input(s): "CKTOTAL", "CKMB", "CKMBINDEX", "TROPONINI" in the last 168 hours. BNP (last 3 results) No results for input(s): "PROBNP" in the last 8760 hours. HbA1C: No results for input(s): "HGBA1C" in the last 72 hours. CBG: No results for input(s): "GLUCAP" in the last 168 hours. Lipid Profile: No results for input(s): "CHOL", "HDL", "LDLCALC", "TRIG", "CHOLHDL", "LDLDIRECT" in the last 72 hours. Thyroid Function Tests: No results for input(s): "TSH", "T4TOTAL", "FREET4", "T3FREE", "THYROIDAB" in the last 72 hours. Anemia Panel: No results for input(s): "VITAMINB12", "FOLATE", "FERRITIN", "TIBC", "IRON", "RETICCTPCT" in the last 72 hours. Sepsis Labs: No results for input(s): "PROCALCITON", "LATICACIDVEN" in the last  168 hours.  Recent Results (from the past 240 hour(s))  Resp panel by RT-PCR (RSV, Flu A&B, Covid) Anterior Nasal Swab     Status: None   Collection Time: 05/31/22 10:33 PM   Specimen: Anterior Nasal Swab  Result Value Ref Range Status   SARS Coronavirus 2 by RT PCR NEGATIVE NEGATIVE Final   Influenza A by PCR NEGATIVE NEGATIVE Final   Influenza B by PCR NEGATIVE NEGATIVE Final    Comment: (NOTE) The Xpert Xpress SARS-CoV-2/FLU/RSV plus assay is intended as an aid in the diagnosis of influenza from Nasopharyngeal swab specimens and should not be used as a sole basis for treatment. Nasal washings and aspirates are unacceptable for Xpert Xpress SARS-CoV-2/FLU/RSV testing.  Fact Sheet for Patients: EntrepreneurPulse.com.au  Fact  Sheet for Healthcare Providers: IncredibleEmployment.be  This test is not yet approved or cleared by the [place] and has been authorized for detection and/or diagnosis of SARS-CoV-2 by FDA under an Emergency Use Authorization (EUA). This EUA will remain in effect (meaning this test can be used) for the duration of the COVID-19 declaration under Section 564(b)(1) of the Act, 21 U.S.C. section 360bbb-3(b)(1), unless the authorization is terminated or revoked.     Resp Syncytial Virus by PCR NEGATIVE NEGATIVE Final    Comment: (NOTE) Fact Sheet for Patients: EntrepreneurPulse.com.au  Fact Sheet for Healthcare Providers: IncredibleEmployment.be  This test is not yet approved or cleared by the [place] FDA and has been authorized for detection and/or diagnosis of SARS-CoV-2 by FDA under an Emergency Use Authorization (EUA). This EUA will remain in effect (meaning this test can be used) for the duration of the COVID-19 declaration under Section 564(b)(1) of the Act, 21 U.S.C. section 360bbb-3(b)(1), unless the authorization is terminated or revoked.  Performed at [facility], [place] [address], [place], [place] [address]   Respiratory (~20 pathogens) panel by PCR     Status: None   Collection Time: 05/31/22 10:33 PM   Specimen: Nasopharyngeal Swab; Respiratory  Result Value Ref Range Status   Adenovirus NOT DETECTED NOT DETECTED Final   Coronavirus 229E NOT DETECTED NOT DETECTED Final    Comment: (NOTE) The Coronavirus on the Respiratory Panel, DOES NOT test for the novel  Coronavirus (2019 nCoV)    Coronavirus HKU1 NOT DETECTED NOT DETECTED Final   Coronavirus NL63 NOT DETECTED NOT DETECTED Final   Coronavirus OC43 NOT DETECTED NOT DETECTED Final   Metapneumovirus NOT DETECTED NOT DETECTED Final   Rhinovirus / Enterovirus NOT DETECTED NOT DETECTED Final   Influenza A NOT DETECTED NOT DETECTED Final   Influenza B NOT DETECTED NOT DETECTED Final   Parainfluenza Virus 1 NOT DETECTED NOT DETECTED Final   Parainfluenza Virus 2 NOT DETECTED NOT DETECTED Final   Parainfluenza Virus 3 NOT DETECTED NOT DETECTED Final   Parainfluenza Virus 4 NOT DETECTED NOT DETECTED Final   Respiratory Syncytial Virus NOT DETECTED NOT DETECTED Final   Bordetella pertussis NOT DETECTED NOT DETECTED Final   Bordetella Parapertussis NOT DETECTED NOT DETECTED Final   Chlamydophila pneumoniae NOT DETECTED NOT DETECTED Final   Mycoplasma pneumoniae NOT DETECTED NOT DETECTED Final    Comment: Performed at [facility], [place]. [address], [place], [place] [address]         Radiology Studies: CT Angio Chest PE W and/or Wo Contrast  Result Date: 05/31/2022 CLINICAL DATA:  Pulmonary embolism (PE) suspected, low to intermediate prob, positive D-dimer EXAM: CT ANGIOGRAPHY CHEST WITH CONTRAST TECHNIQUE: Multidetector CT imaging of the chest was performed using the standard protocol during bolus administration of intravenous contrast. Multiplanar CT image reconstructions and MIPs were obtained to evaluate the vascular anatomy. RADIATION DOSE REDUCTION: This exam was  performed according to the departmental dose-optimization program which includes automated exposure control, adjustment of the mA and/or kV according to patient size and/or use of iterative reconstruction technique. CONTRAST:  68m OMNIPAQUE IOHEXOL 350 MG/ML SOLN COMPARISON:  06/12/2021 FINDINGS: Cardiovascular: Adequate opacification of the pulmonary arterial tree. No intraluminal filling defect identified to suggest acute pulmonary embolism. Central pulmonary arteries are of normal caliber. No significant coronary artery calcification. Cardiac size within normal limits. No pericardial effusion. Mild atherosclerotic calcification within the thoracic aorta. No aortic aneurysm. Mediastinum/Nodes: No enlarged mediastinal, hilar, or axillary lymph nodes.  Thyroid gland, trachea, and esophagus demonstrate no significant findings. Lungs/Pleura: Mild emphysema. Known right lower lobe ground-glass pulmonary nodule is partially obscured by discoid atelectasis at axial image # 90/6. No new focal pulmonary nodules or infiltrates. No pneumothorax or pleural effusion. Bronchial wall thickening is present in keeping with airway inflammation. No central obstructing lesion. Upper Abdomen: Status post cholecystectomy.  No acute abnormality. Musculoskeletal: Osseous structures are age-appropriate. No acute bone abnormality. No lytic or blastic bone lesion. Review of the MIP images confirms the above findings. IMPRESSION: 1. No pulmonary embolism. No acute intrathoracic pathology identified. 2. Mild emphysema. 3. Known right lower lobe ground-glass pulmonary nodule is partially obscured by discoid atelectasis on today's examination. Re-evaluation with outpatient routine lung cancer screening CT examination is recommended for continued observation. 4. Bronchial wall thickening in keeping with airway inflammation. No central obstructing lesion. Emphysema (ICD10-J43.9). Electronically Signed   By: Fidela Salisbury M.D.   On: 05/31/2022  21:59   DG Chest Port 1 View  Result Date: 05/31/2022 CLINICAL DATA:  Left-sided chest and epigastric pain. EXAM: PORTABLE CHEST 1 VIEW COMPARISON:  Chest CT 06/12/2021 FINDINGS: The cardiomediastinal contours are normal. Hyperinflation and bronchial thickening. Pulmonary vasculature is normal. No consolidation, pleural effusion, or pneumothorax. Spurring at the right first rib costochondral junction. No acute osseous abnormalities are seen. IMPRESSION: Hyperinflation and bronchial thickening, likely related to known emphysema. No acute findings. Electronically Signed   By: Keith Rake M.D.   On: 05/31/2022 17:30        Scheduled Meds:  arformoterol  15 mcg Nebulization BID   aspirin EC  81 mg Oral Daily   atorvastatin  80 mg Oral Daily   budesonide (PULMICORT) nebulizer solution  0.5 mg Nebulization BID   DULoxetine  20 mg Oral Daily   empagliflozin  25 mg Oral QAC breakfast   enoxaparin (LOVENOX) injection  40 mg Subcutaneous QHS   fluticasone  2 spray Each Nare Daily   folic acid  1 mg Oral Daily   guaiFENesin  600 mg Oral BID   ipratropium-albuterol  3 mL Nebulization Q6H   loratadine  10 mg Oral Daily   methylPREDNISolone (SOLU-MEDROL) injection  40 mg Intravenous Q12H   metoprolol succinate  100 mg Oral Daily   multivitamin  1 tablet Oral Daily   pantoprazole (PROTONIX) IV  40 mg Intravenous QHS   revefenacin  175 mcg Nebulization Daily   spiritus frumenti  1 each Oral BID WC   thiamine  100 mg Oral Daily   Continuous Infusions:  azithromycin Stopped (06/02/22 0202)     LOS: 2 days    Time spent: 35 minutes.     Elmarie Shiley, MD [facility]   If 7PM-7AM, please contact night-coverage [email or web address]  06/02/2022, 2:07 PM

## 2022-06-03 ENCOUNTER — Other Ambulatory Visit (HOSPITAL_COMMUNITY): Payer: Self-pay

## 2022-06-03 DIAGNOSIS — J441 Chronic obstructive pulmonary disease with (acute) exacerbation: Secondary | ICD-10-CM | POA: Diagnosis not present

## 2022-06-03 LAB — BASIC METABOLIC PANEL
Anion gap: 7 (ref 5–15)
BUN: 20 mg/dL (ref 8–23)
CO2: 24 mmol/L (ref 22–32)
Calcium: 9.1 mg/dL (ref 8.9–10.3)
Chloride: 105 mmol/L (ref 98–111)
Creatinine, Ser: 0.82 mg/dL (ref 0.44–1.00)
GFR, Estimated: >60 mL/min (ref >60)
Glucose, Bld: 144 mg/dL — ABNORMAL HIGH (ref 70–99)
Potassium: 4.1 mmol/L (ref 3.5–5.1)
Sodium: 136 mmol/L (ref 135–145)

## 2022-06-03 LAB — TOXASSURE SELECT,+ANTIDEPR,UR

## 2022-06-03 MED ORDER — PREDNISONE 20 MG PO TABS
40.0000 mg | ORAL_TABLET | Freq: Every day | ORAL | Status: DC
  Administered 2022-06-03: 40 mg via ORAL
  Filled 2022-06-03: qty 2

## 2022-06-03 MED ORDER — NICOTINE 21 MG/24HR TD PT24
21.0000 mg | MEDICATED_PATCH | Freq: Every day | TRANSDERMAL | 0 refills | Status: DC | PRN
  Filled 2022-06-03: qty 28, 28d supply, fill #0

## 2022-06-03 MED ORDER — PANTOPRAZOLE SODIUM 40 MG PO TBEC: 40.0000 mg | DELAYED_RELEASE_TABLET | Freq: Every day | ORAL | Status: DC

## 2022-06-03 MED ORDER — AZITHROMYCIN 250 MG PO TABS
250.0000 mg | ORAL_TABLET | Freq: Every day | ORAL | 0 refills | Status: DC
  Filled 2022-06-03: qty 3, 3d supply, fill #0

## 2022-06-03 MED ORDER — FOLIC ACID 1 MG PO TABS
1.0000 mg | ORAL_TABLET | Freq: Every day | ORAL | 0 refills | Status: DC
  Filled 2022-06-03: qty 30, 30d supply, fill #0

## 2022-06-03 MED ORDER — PREDNISONE 20 MG PO TABS
40.0000 mg | ORAL_TABLET | Freq: Every day | ORAL | 0 refills | Status: AC
  Filled 2022-06-03: qty 10, 5d supply, fill #0

## 2022-06-03 MED ORDER — GUAIFENESIN ER 600 MG PO TB12
1200.0000 mg | ORAL_TABLET | Freq: Two times a day (BID) | ORAL | 1 refills | Status: DC
  Filled 2022-06-03: qty 30, 8d supply, fill #0

## 2022-06-03 MED ORDER — ALBUTEROL SULFATE HFA 108 (90 BASE) MCG/ACT IN AERS
2.0000 | INHALATION_SPRAY | Freq: Four times a day (QID) | RESPIRATORY_TRACT | 2 refills | Status: AC | PRN
  Filled 2022-06-03: qty 6.7, 16d supply, fill #0

## 2022-06-03 MED ORDER — FLUTICASONE PROPIONATE 50 MCG/ACT NA SUSP
2.0000 | Freq: Every day | NASAL | 2 refills | Status: DC
  Filled 2022-06-03: qty 16, 30d supply, fill #0

## 2022-06-03 MED ORDER — AZITHROMYCIN 250 MG PO TABS: 500.0000 mg | ORAL_TABLET | Freq: Every day | ORAL | Status: DC

## 2022-06-03 MED ORDER — PANTOPRAZOLE SODIUM 40 MG PO TBEC
40.0000 mg | DELAYED_RELEASE_TABLET | Freq: Every day | ORAL | 0 refills | Status: DC
  Filled 2022-06-03: qty 30, 30d supply, fill #0

## 2022-06-03 MED ORDER — LORATADINE 10 MG PO TABS
10.0000 mg | ORAL_TABLET | Freq: Every day | ORAL | 0 refills | Status: DC
  Filled 2022-06-03: qty 30, 30d supply, fill #0

## 2022-06-03 NOTE — Care Management Important Message (Signed)
Important Message  Patient Details  Name: Katrina [name] MRN: [id number] Date of Birth: 04-Sep-1955   Medicare Important Message Given:  Yes     Shelda Altes 06/03/2022, 10:04 AM

## 2022-06-03 NOTE — TOC Initial Note (Signed)
Transition of Care [facility]) - Initial/Assessment Note    Patient Details  Name: Katrina [name] MRN: [id number] Date of Birth: [birth year]  [facility] CM/SW Contact:    Bethena Roys, RN Phone Number: 06/03/2022, 11:20 AM  Clinical Narrative: Patient presented for shortness of breath acute COPD exacerbation. PTA patient was from home with support of family. PT recommendations for home health PT- Asked MD for [facility] RN for disease management to reduce the risk of readmission. Patient has used [facility] in the past and wants to use them now. Referral submitted and start of care to begin within 24-48 hours post transition home. No further needs identified at this time.               Expected Discharge Plan: [place] Barriers to Discharge: No Barriers Identified  Patient Goals and CMS Choice Patient states their goals for this hospitalization and ongoing recovery are:: to return home.   Choice offered to / list presented to : NA (Patient declined Medicare.gov list has used [facility] in the past did not need choice.)      Expected Discharge Plan and Services In-house Referral: NA Discharge Planning Services: CM Consult Post Acute Care Choice: Home Health Living arrangements for the past 2 months: Single Family Home Expected Discharge Date: 06/03/22               DME Arranged: N/A DME Agency: NA     HH Arranged: RN, Disease Management, PT HH Agency: [place]     Prior Living Arrangements/Services Living arrangements for the past 2 months: Single Family Home Lives with:: Relatives Patient language and need for interpreter reviewed:: Yes Do you feel safe going back to the place where you live?: Yes      Need for Family Participation in Patient Care: Yes (Comment) Care giver support system in place?: Yes (comment)   Criminal Activity/Legal Involvement Pertinent to Current Situation/Hospitalization: No - Comment  as needed  Activities of Daily Living Home Assistive Devices/Equipment: Grab bars in shower ADL Screening (condition at time of admission) Patient's cognitive ability adequate to safely complete daily activities?: Yes Is the patient deaf or have difficulty hearing?: No Does the patient have difficulty seeing, even when wearing glasses/contacts?: No Does the patient have difficulty concentrating, remembering, or making decisions?: No Patient able to express need for assistance with ADLs?: Yes Does the patient have difficulty dressing or bathing?: No Independently performs ADLs?: Yes (appropriate for developmental age) Does the patient have difficulty walking or climbing stairs?: Yes Weakness of Legs: None Weakness of Arms/Hands: Right  Permission Sought/Granted Permission sought to share information with : Case Manager Permission granted to share information with : Yes, Verbal Permission Granted     Permission granted to share info w AGENCY: [place]    Emotional Assessment Appearance:: Appears stated age Attitude/Demeanor/Rapport: Engaged Affect (typically observed): Appropriate Orientation: : Oriented to  Time, Oriented to Place, Oriented to Self, Oriented to Situation   Psych Involvement: No (comment)  Admission diagnosis:  Acute exacerbation of chronic obstructive pulmonary disease (COPD) ([place]) [J44.1] COPD exacerbation ([place]) [J44.1] Acute respiratory failure with hypoxia (HCC) [J96.01] Nonspecific chest pain [R07.9] Acute hypoxic respiratory failure ([place]) [J96.01] Patient Active Problem List   Diagnosis Date Noted   Acute hypoxic respiratory failure ([place]) 06/01/2022   Elevated troponin 06/01/2022   Hyperkalemia 06/01/2022   SIRS (systemic inflammatory response syndrome) ([place]) 06/01/2022   Epigastric pain 06/01/2022   Chronic  alcohol abuse 06/01/2022   Acute exacerbation of chronic obstructive pulmonary disease (COPD) ([place]) [id number]   Chronic systolic  CHF (congestive heart failure) ([place]) 05/21/2021   Leukocytosis 03/07/2021   Hyperlipidemia 03/07/2021   Left bundle branch block 02/20/2021   Left axis deviation 02/20/2021   Acquired hallux varus of left foot 02/20/2021   Porokeratosis 12/31/2020   Sesamoiditis 12/31/2020   Hav (hallux abducto valgus), left 12/31/2020   Encounter to establish care 12/16/2020   Cigarette smoker motivated to quit 12/16/2020   Callus of foot 12/16/2020   Body mass index (BMI) of 34.0-34.9 in adult 12/16/2020   Other spondylosis with radiculopathy, cervical region 04/02/2020    Class: Chronic   Pain in finger of left hand 02/24/2020   Lung nodules 11/30/2019   Neck pain 08/04/2019   Acute pain of right shoulder 04/27/2019   Neck mass 04/27/2019   Postmenopausal bleeding 06/16/2018   S/P hysterectomy 06/16/2018   Chronic right shoulder pain 10/28/2016   Impingement syndrome of right shoulder 10/28/2016   Osteoarthritis of left knee 12/15/2014   Status post total left knee replacement 12/15/2014   Hand pain, left 04/21/2014   Cat bite of finger 04/21/2014   Asthma 04/21/2014   Tobacco abuse 04/21/2014   COPD (chronic obstructive pulmonary disease) ([place])    Effusion of left knee joint 11/03/2013   Arthritis of right knee 12/03/2012   Degenerative arthritis of hip 05/02/2011   PCP:  Ronnell Freshwater, NP Pharmacy:   [place], [place] - [place], [id number], [place] [address] Phone: [phone] Fax: [phone]  [facility] [address] [place], [place] [address] Phone: [phone] Fax: [phone]  [name] [address] [place] [address] Phone: [phone] Fax: [phone]  Social Determinants of Health (SDOH) Social History: SDOH Screenings   Food Insecurity: No Food Insecurity (06/01/2022)  Housing: Low Risk   (06/01/2022)  Transportation Needs: No Transportation Needs (06/01/2022)  Utilities: Not At Risk (06/01/2022)  Alcohol Screen: Medium Risk (03/07/2021)  Depression (PHQ2-9): Low Risk  (05/30/2022)  Recent Concern: Depression (PHQ2-9) - Medium Risk (05/02/2022)  Financial Resource Strain: Medium Risk (03/07/2021)  Physical Activity: Insufficiently Active (03/07/2021)  Social Connections: Moderately Isolated (03/07/2021)  Stress: Stress Concern Present (03/07/2021)  Tobacco Use: High Risk (06/01/2022)   Readmission Risk Interventions     No data to display

## 2022-06-03 NOTE — Discharge Summary (Signed)
Physician Discharge Summary   Patient: Katrina [name] MRN: [id number] DOB: [birth year]  Admit date:     05/31/2022  Discharge date: 06/03/22  Discharge Physician: Elmarie Shiley   PCP: Ronnell Freshwater, NP   Recommendations at discharge:    Needs follow up with Pulmonologist for screening lung cancer and further management of COPD  Discharge Diagnoses: Principal Problem:   Acute exacerbation of chronic obstructive pulmonary disease (COPD) ([place]) Active Problems:   Tobacco abuse   Hyperlipidemia   Chronic systolic CHF (congestive heart failure) (HCC)   Acute hypoxic respiratory failure (HCC)   Elevated troponin   Hyperkalemia   SIRS (systemic inflammatory response syndrome) (HCC)   Epigastric pain   Chronic alcohol abuse  Resolved Problems:   * No resolved hospital problems. *  Hospital Course: 66 PMH COPD. Chronic Systolic heart failure [id number] % , Left Bundle branch block, HLD, chronic alcohol abuse, presented with acute COPD exacerbation. Patient presenting with 1 day progressive shortness of breath associated with cough and wheezing. Patient was found to be hypoxic oxygen saturation 80 on room air required 2 L to increase oxygen saturation to 93 to 96%. Found to have hyperkalemia, hyperbilirubinemia, D-dimer 0.6.  COVID influenza RSV negative.  Leukocytosis. CT chest negative for PE or any infection or acute cardiopulmonary process.   Patient receives IV Solu-Medrol, Reglan and morphine in the ED.  Admitted with COPD exacerbation.  Assessment and Plan: 1-Acute COPD exacerbation, Acute hypoxic respiratory failure and distress: -Patient continues to smoke, she report cough, shortness of breath -She does not use home oxygen.  She required 5 L of oxygen to keep oxygen saturation 90% on admission.  -Continue with IV Solu-Medrol twice daily, continue with the scheduled DuoNeb. -Started on Yupelri, Pulmicort and [place]  consulted, for further evaluation of  acute hypoxic respiratory failure -CT angio negative for PE.  Mild emphysema.  No right lower lobe groundglass pulmonary nodule.  Follow-up with screening CTA as an outpatient -Respiratory panel negative.  COVID RSV and influenza negative. -BNP normal -Continue  with azithromycin She is now breathing at baseline, off oxygen.  Plan to discharge on prednisone for 5 days, azithro and albuterol, resume Breztri.    Mild elevation of troponin: In the setting of acute hypoxic respiratory failure.  Very mildly elevation troponin at 21   Hyperkalemia: Resolved Holding cozaar.     SIRS;  In the setting of #1, continue with azithromycin   Epigastric Discomfort.  Chest pain Mild elevation lipase.  Continue with PPI.  No Significant elevation of troponin, CT angio negative for PE.   Chronic Systolic heart failure: Ejection fraction 35 to 40% Euvolemic.  BNP normal.  Resume spironolactone, monitor K .  K normal range on spironolactone.  Will continue to hold cozaar for now    Obesity; needs life style modification                 Consultants: Pulmonologist  Procedures performed:  none Disposition: Home Diet recommendation:  Discharge Diet Orders (From admission, onward)     Start     Ordered   06/03/22 0000  Diet - low sodium heart healthy        06/03/22 0934           Cardiac diet DISCHARGE MEDICATION: Allergies as of 06/03/2022       Reactions   Codeine Itching, Nausea Only        Medication List     STOP taking these medications  losartan 50 MG tablet Commonly known as: COZAAR       TAKE these medications    acetaminophen 500 MG tablet Commonly known as: TYLENOL Take 1,000 mg by mouth every 6 (six) hours as needed for moderate pain.   albuterol 108 (90 Base) MCG/ACT inhaler Commonly known as: VENTOLIN HFA Inhale 2 puffs into the lungs every 6 (six) hours as needed for wheezing or shortness of breath.   aspirin EC 81 MG tablet Take 1  tablet (81 mg total) by mouth daily. Swallow whole.   atorvastatin 80 MG tablet Commonly known as: LIPITOR Take 1 tablet (80 mg total) by mouth daily.   azithromycin 250 MG tablet Commonly known as: ZITHROMAX Take 1 tablet (250 mg total) by mouth daily.   Breztri Aerosphere 160-9-4.8 MCG/ACT Aero Generic drug: Budeson-Glycopyrrol-Formoterol Inhale 2 puffs into the lungs in the morning and at bedtime.   DULoxetine 20 MG capsule Commonly known as: Cymbalta Take 1 capsule (20 mg total) by mouth daily.   empagliflozin 25 MG Tabs tablet Commonly known as: Jardiance Take 1 tablet (25 mg total) by mouth daily before breakfast.   fluticasone 50 MCG/ACT nasal spray Commonly known as: FLONASE Place 2 sprays into both nostrils daily.   folic acid 1 MG tablet Commonly known as: FOLVITE Take 1 tablet (1 mg total) by mouth daily.   loratadine 10 MG tablet Commonly known as: CLARITIN Take 1 tablet (10 mg total) by mouth daily.   metoprolol succinate 100 MG 24 hr tablet Commonly known as: TOPROL-XL Take 1 tablet (100 mg total) by mouth daily. Take with or immediately following a meal.   Mucus Relief 600 MG 12 hr tablet Generic drug: guaiFENesin Take 2 tablets (1,200 mg total) by mouth 2 (two) times daily.   nicotine 21 mg/24hr patch Commonly known as: NICODERM CQ - dosed in mg/24 hours Place 1 patch (21 mg total) onto the skin daily as needed (smoking cessation).   pantoprazole 40 MG tablet Commonly known as: PROTONIX Take 1 tablet (40 mg total) by mouth at bedtime.   predniSONE 20 MG tablet Commonly known as: DELTASONE Take 2 tablets (40 mg total) by mouth daily with breakfast for 5 days.   spironolactone 25 MG tablet Commonly known as: ALDACTONE Take 1 tablet (25 mg total) by mouth at bedtime.   Voltaren Arthritis Pain 1 % Gel Generic drug: diclofenac Sodium Apply 2 g topically 4 (four) times daily as needed (pain).        Follow-up Information     Ronnell Freshwater, NP Follow up in 1 week(s).   Specialty: Family Medicine Contact information: [place] [address] [phone]         [facility] [place] Follow up.   Specialty: Home Health Services Why: Registered [facility]-office to call with visit times. Contact information: [place] [address] [phone]                Discharge Exam: Danley Danker Weights   05/31/22 1643 06/02/22 0531 06/03/22 0500  Weight: 106.1 kg 102.9 kg 106.8 kg   General; NAD  Condition at discharge: stable  The results of significant diagnostics from this hospitalization (including imaging, microbiology, ancillary and laboratory) are listed below for reference.   Imaging Studies: CT Angio Chest PE W and/or Wo Contrast  Result Date: 05/31/2022 CLINICAL DATA:  Pulmonary embolism (PE) suspected, low to intermediate prob, positive D-dimer EXAM: CT ANGIOGRAPHY CHEST WITH  CONTRAST TECHNIQUE: Multidetector CT imaging of the chest was performed using the standard protocol during bolus administration of intravenous contrast. Multiplanar CT image reconstructions and MIPs were obtained to evaluate the vascular anatomy. RADIATION DOSE REDUCTION: This exam was performed according to the departmental dose-optimization program which includes automated exposure control, adjustment of the mA and/or kV according to patient size and/or use of iterative reconstruction technique. CONTRAST:  30m OMNIPAQUE IOHEXOL 350 MG/ML SOLN COMPARISON:  06/12/2021 FINDINGS: Cardiovascular: Adequate opacification of the pulmonary arterial tree. No intraluminal filling defect identified to suggest acute pulmonary embolism. Central pulmonary arteries are of normal caliber. No significant coronary artery calcification. Cardiac size within normal limits. No pericardial effusion. Mild atherosclerotic calcification within the thoracic aorta. No aortic aneurysm. Mediastinum/Nodes: No  enlarged mediastinal, hilar, or axillary lymph nodes. Thyroid gland, trachea, and esophagus demonstrate no significant findings. Lungs/Pleura: Mild emphysema. Known right lower lobe ground-glass pulmonary nodule is partially obscured by discoid atelectasis at axial image # 90/6. No new focal pulmonary nodules or infiltrates. No pneumothorax or pleural effusion. Bronchial wall thickening is present in keeping with airway inflammation. No central obstructing lesion. Upper Abdomen: Status post cholecystectomy.  No acute abnormality. Musculoskeletal: Osseous structures are age-appropriate. No acute bone abnormality. No lytic or blastic bone lesion. Review of the MIP images confirms the above findings. IMPRESSION: 1. No pulmonary embolism. No acute intrathoracic pathology identified. 2. Mild emphysema. 3. Known right lower lobe ground-glass pulmonary nodule is partially obscured by discoid atelectasis on today's examination. Re-evaluation with outpatient routine lung cancer screening CT examination is recommended for continued observation. 4. Bronchial wall thickening in keeping with airway inflammation. No central obstructing lesion. Emphysema (ICD10-J43.9). Electronically Signed   By: AFidela SalisburyM.D.   On: 05/31/2022 21:59   DG Chest Port 1 View  Result Date: 05/31/2022 CLINICAL DATA:  Left-sided chest and epigastric pain. EXAM: PORTABLE CHEST 1 VIEW COMPARISON:  Chest CT 06/12/2021 FINDINGS: The cardiomediastinal contours are normal. Hyperinflation and bronchial thickening. Pulmonary vasculature is normal. No consolidation, pleural effusion, or pneumothorax. Spurring at the right first rib costochondral junction. No acute osseous abnormalities are seen. IMPRESSION: Hyperinflation and bronchial thickening, likely related to known emphysema. No acute findings. Electronically Signed   By: MKeith RakeM.D.   On: 05/31/2022 17:30    Microbiology: Results for orders placed or performed during the hospital  encounter of 05/31/22  Resp panel by RT-PCR (RSV, Flu A&B, Covid) Anterior Nasal Swab     Status: None   Collection Time: 05/31/22 10:33 PM   Specimen: Anterior Nasal Swab  Result Value Ref Range Status   SARS Coronavirus 2 by RT PCR NEGATIVE NEGATIVE Final   Influenza A by PCR NEGATIVE NEGATIVE Final   Influenza B by PCR NEGATIVE NEGATIVE Final    Comment: (NOTE) The Xpert Xpress SARS-CoV-2/FLU/RSV plus assay is intended as an aid in the diagnosis of influenza from Nasopharyngeal swab specimens and should not be used as a sole basis for treatment. Nasal washings and aspirates are unacceptable for Xpert Xpress SARS-CoV-2/FLU/RSV testing.  Fact Sheet for Patients: hEntrepreneurPulse.com.au Fact Sheet for Healthcare Providers: hIncredibleEmployment.be This test is not yet approved or cleared by the [place] and has been authorized for detection and/or diagnosis of SARS-CoV-2 by FDA under an Emergency Use Authorization (EUA). This EUA will remain in effect (meaning this test can be used) for the duration of the COVID-19 declaration under Section 564(b)(1) of the Act, 21 U.S.C. section 360bbb-3(b)(1), unless the authorization is terminated or revoked.  Resp Syncytial Virus by PCR NEGATIVE NEGATIVE Final    Comment: (NOTE) Fact Sheet for Patients: EntrepreneurPulse.com.au  Fact Sheet for Healthcare Providers: IncredibleEmployment.be  This test is not yet approved or cleared by the [place] FDA and has been authorized for detection and/or diagnosis of SARS-CoV-2 by FDA under an Emergency Use Authorization (EUA). This EUA will remain in effect (meaning this test can be used) for the duration of the COVID-19 declaration under Section 564(b)(1) of the Act, 21 U.S.C. section 360bbb-3(b)(1), unless the authorization is terminated or revoked.  Performed at [facility], [place] [address],  [place], [place] [address]   Respiratory (~20 pathogens) panel by PCR     Status: None   Collection Time: 05/31/22 10:33 PM   Specimen: Nasopharyngeal Swab; Respiratory  Result Value Ref Range Status   Adenovirus NOT DETECTED NOT DETECTED Final   Coronavirus 229E NOT DETECTED NOT DETECTED Final    Comment: (NOTE) The Coronavirus on the Respiratory Panel, DOES NOT test for the novel  Coronavirus (2019 nCoV)    Coronavirus HKU1 NOT DETECTED NOT DETECTED Final   Coronavirus NL63 NOT DETECTED NOT DETECTED Final   Coronavirus OC43 NOT DETECTED NOT DETECTED Final   Metapneumovirus NOT DETECTED NOT DETECTED Final   Rhinovirus / Enterovirus NOT DETECTED NOT DETECTED Final   Influenza A NOT DETECTED NOT DETECTED Final   Influenza B NOT DETECTED NOT DETECTED Final   Parainfluenza Virus 1 NOT DETECTED NOT DETECTED Final   Parainfluenza Virus 2 NOT DETECTED NOT DETECTED Final   Parainfluenza Virus 3 NOT DETECTED NOT DETECTED Final   Parainfluenza Virus 4 NOT DETECTED NOT DETECTED Final   Respiratory Syncytial Virus NOT DETECTED NOT DETECTED Final   Bordetella pertussis NOT DETECTED NOT DETECTED Final   Bordetella Parapertussis NOT DETECTED NOT DETECTED Final   Chlamydophila pneumoniae NOT DETECTED NOT DETECTED Final   Mycoplasma pneumoniae NOT DETECTED NOT DETECTED Final    Comment: Performed at [facility], [place]. [address] [place], [place] [address]    Labs: CBC: Recent Labs  Lab 05/31/22 1701 06/01/22 0005 06/01/22 0012 06/02/22 0135  WBC 12.8* 13.3*  --  20.2*  NEUTROABS 7.9* 9.8*  --   --   HGB 14.3 13.8 14.3 12.4  HCT 42.4 41.1 42.0 38.6  MCV 96.4 96.7  --  99.2  PLT 264 239  --  [id number]   Basic Metabolic Panel: Recent Labs  Lab 05/31/22 1701 06/01/22 0002 06/01/22 0012 06/02/22 0132 06/02/22 0135 06/03/22 0153  NA 132* 132* 134*  --  134* 136  K 5.3* 4.3 4.3  --  4.5 4.1  CL 102 102  --   --  105 105  CO2 16* 20*  --   --  21* 24  GLUCOSE 88 122*  --   --  141* 144*   BUN 12 10  --   --  24* 20  CREATININE 0.98 1.01*  --   --  0.99 0.82  CALCIUM 8.6* 8.5*  --   --  8.7* 9.1  MG  --  2.2  --  2.5*  --   --   PHOS  --  3.2  --   --   --   --    Liver Function Tests: Recent Labs  Lab 05/31/22 1701 06/01/22 0002  AST 29 34  ALT 25 35  ALKPHOS 132* 152*  BILITOT 1.9* 0.6  PROT 6.9 7.0  ALBUMIN 3.5 3.7   CBG: No results for  input(s): "GLUCAP" in the last 168 hours.  Discharge time spent: greater than 30 minutes.  Signed: Elmarie Shiley, MD [facility] 06/03/2022

## 2022-06-03 NOTE — Telephone Encounter (Signed)
Routing to front desk pool for help with getting pt scheduled for HFU with either with Dr. Valeta Harms or APP within the next 2-4 weeks.

## 2022-06-03 NOTE — Progress Notes (Signed)
PHARMACIST - PHYSICIAN COMMUNICATION   CONCERNING: IV to Oral Route Change Policy  RECOMMENDATION: This patient is receiving azithromycin by the intravenous route.  Based on criteria approved by the Pharmacy and Therapeutics Committee, the intravenous medication(s) is/are being converted to the equivalent oral dose form(s).   DESCRIPTION: These criteria include: The patient is eating (either orally or via tube) and/or has been taking other orally administered medications for a least 24 hours The patient has no evidence of active gastrointestinal bleeding or impaired GI absorption (gastrectomy, short bowel, patient on TNA or NPO).  If you have questions about this conversion, please contact the Pharmacy Department  '[]'$   [phone] )  [facility] '[]'$   [phone] )  [facility] '[x]'$   [phone] )  [name] '[]'$   [phone] )  [facility] '[]'$   [phone] )  [place], PharmD Clinical Pharmacist **Pharmacist phone directory can now be found on [email or web address] ([email or web address]).  Listed under [place].

## 2022-06-03 NOTE — Progress Notes (Signed)
Patient able to maintain oxygen saturation of 90%-95 % while ambulating on room air. Patient does not meet criteria for home oxygen at this time.

## 2022-06-03 NOTE — Telephone Encounter (Signed)
ATC patient. Unable to leave vm due to vm being full. Will try to call again later.

## 2022-06-03 NOTE — Progress Notes (Signed)
Discharge instruction reviewed with patient utilizing teach back method. No questions at this time patient being discharged home. Transition of care  medications received from pharmacy and given to patient.

## 2022-06-03 NOTE — Telephone Encounter (Signed)
Have called LVM to call back no anwser

## 2022-06-03 NOTE — Telephone Encounter (Signed)
Have sent pt a mychart message for her to call the office to get appt scheduled. Will leave encounter open.

## 2022-06-05 NOTE — Telephone Encounter (Signed)
Pt scheduled for an appt with Dr. Valeta Harms 3/14. Closing encounter.

## 2022-06-12 ENCOUNTER — Encounter: Payer: Self-pay | Admitting: Radiology

## 2022-06-12 ENCOUNTER — Ambulatory Visit: Payer: Medicare Other | Attending: Cardiovascular Disease | Admitting: Pharmacist

## 2022-06-12 DIAGNOSIS — Z720 Tobacco use: Secondary | ICD-10-CM | POA: Diagnosis not present

## 2022-06-12 DIAGNOSIS — E782 Mixed hyperlipidemia: Secondary | ICD-10-CM | POA: Diagnosis not present

## 2022-06-12 MED ORDER — EZETIMIBE 10 MG PO TABS: 10.0000 mg | ORAL_TABLET | Freq: Every day | ORAL | 3 refills | Status: DC

## 2022-06-12 NOTE — Progress Notes (Signed)
Patient ID: SHALI HAU                 DOB: [birth year]                    MRN: [id number]      HPI: Katrina [name] is a 67 y.o. female patient referred to lipid clinic by  Dr. Ali Lowe. PMH is significant for NICM, CHF, aortic atherosclerosis, HLD, LBBB, tobacco use, elevated BMI, CKD stage 2, COPD.  In visit with Dr. Ali Lowe on 05/02/2022 patient reported having difficulties affording medications including Jardiance and Entresto. Patient assistance was approved for Jardiance. Delene Loll was discontinued.  Most recent LDL-C is 105, TG 215. ASCVD Risk: 9.4%  In visit today patient was very anxious due to being alone and not used to driving. Patient brought her medications. Patient is now taking Jardiance. Patient reports appetite is decreased, can go 2-3 days without eating. When eating, typically drinks a breakfast drink, skips lunch, and then eats dinner. Reports feeling very full after dinner. Patient reports significant knee and back pain, lying down helps the pain. Reports not being able to afford PT, but tries to do some exercises at home. Patient reported being denied Medicaid in the past, has not tried in 2024. Reviewed options for lowering LDL cholesterol, including ezetimibe and Repatha.   Current Medications: atorvastatin '80mg'$  Intolerances:  Risk Factors: Tobacco use, CHF, Age > 65 LDL-C goal: < 70  ApoB goal: < 80  Diet: eats chicken, cutting back on red meat, pre-packaged meat, and hot dogs (Does not eat a lot of meat) Has some "cheat days" - eating out Typically eats a meat and 2 vegetables at dinner.  Vegetables: beans, green beans, salad (grape tomatoes, cucumber, onion, green pepper, thousand island dressing or ranch dressing), carrot, peas, cabbage, broccoli, brussels sprouts  Drinks: Carnation instant breakfast drinks, 2% milk - Beer: 3 drinks/day ~ 12 oz  - Coke: 32 oz, 3/week   Exercise: Limited by pain: knees and back. Tries to do PT at home.   Family History:  Mother: heart disease, HLD, HTN. Brother: Depression, Diabetes   Social History: Current smoker 1 pack/day for 25 years, no smokeless tobacco, alcohol use decreased from 49 drinks/week to 3 beers/day, no drug use.   Labs: Lipid Panel     Component Value Date/Time   CHOL 197 05/02/2022 1105   TRIG 215 (H) 05/02/2022 1105   HDL 55 05/02/2022 1105   CHOLHDL 3.6 05/02/2022 1105   LDLCALC 105 (H) 05/02/2022 1105   LABVLDL 37 05/02/2022 1105    Past Medical History:  Diagnosis Date   Alcohol abuse    Anxiety    Arthritis    knees, hips, elbows    Asthma    hx of asthma 3/12- hospitalized    Chronic systolic heart failure (HCC)    COPD (chronic obstructive pulmonary disease) (HCC)    Depression    GERD (gastroesophageal reflux disease)    PRIOR TO HAVING GALLBLADDER REMOVED   Headache    from her neck   Left bundle branch block 06/28/2010   Lung infection 2015   Tobacco abuse     Current Outpatient Medications on File Prior to Visit  Medication Sig Dispense Refill   acetaminophen (TYLENOL) 500 MG tablet Take 1,000 mg by mouth every 6 (six) hours as needed for moderate pain.     albuterol (VENTOLIN HFA) 108 (90 Base) MCG/ACT inhaler Inhale 2 puffs into the lungs every 6 (six) hours  as needed for wheezing or shortness of breath. 6.7 g 2   aspirin EC 81 MG tablet Take 1 tablet (81 mg total) by mouth daily. Swallow whole. 90 tablet 3   atorvastatin (LIPITOR) 80 MG tablet Take 1 tablet (80 mg total) by mouth daily. 90 tablet 3   azithromycin (ZITHROMAX) 250 MG tablet Take 1 tablet (250 mg total) by mouth daily. 3 each 0   Budeson-Glycopyrrol-Formoterol (BREZTRI AEROSPHERE) 160-9-4.8 MCG/ACT AERO Inhale 2 puffs into the lungs in the morning and at bedtime. 10.7 g 11   diclofenac Sodium (VOLTAREN ARTHRITIS PAIN) 1 % GEL Apply 2 g topically 4 (four) times daily as needed (pain).     DULoxetine (CYMBALTA) 20 MG capsule Take 1 capsule (20 mg total) by mouth daily. 30 capsule 2    empagliflozin (JARDIANCE) 25 MG TABS tablet Take 1 tablet (25 mg total) by mouth daily before breakfast. 90 tablet 3   fluticasone (FLONASE) 50 MCG/ACT nasal spray Place 2 sprays into both nostrils daily. 16 g 2   folic acid (FOLVITE) 1 MG tablet Take 1 tablet (1 mg total) by mouth daily. 30 tablet 0   guaiFENesin (MUCINEX) 600 MG 12 hr tablet Take 2 tablets (1,200 mg total) by mouth 2 (two) times daily. 60 tablet 1   loratadine (CLARITIN) 10 MG tablet Take 1 tablet (10 mg total) by mouth daily. 30 tablet 0   metoprolol succinate (TOPROL-XL) 100 MG 24 hr tablet Take 1 tablet (100 mg total) by mouth daily. Take with or immediately following a meal. 90 tablet 3   nicotine (NICODERM CQ - DOSED IN MG/24 HOURS) 21 mg/24hr patch Place 1 patch (21 mg total) onto the skin daily as needed (smoking cessation). 28 patch 0   pantoprazole (PROTONIX) 40 MG tablet Take 1 tablet (40 mg total) by mouth at bedtime. 30 tablet 0   spironolactone (ALDACTONE) 25 MG tablet Take 1 tablet (25 mg total) by mouth at bedtime. 90 tablet 3   [DISCONTINUED] clonazePAM (KLONOPIN) 0.5 MG tablet Take 1 tablet (0.5 mg total) by mouth 2 (two) times daily as needed for anxiety. 30 tablet 0   No current facility-administered medications on file prior to visit.    Allergies  Allergen Reactions   Codeine Itching and Nausea Only    Assessment/Plan:  1. Hyperlipidemia -  Hyperlipidemia Assessment:  Patient reports she is making changes in her eating/drinking to improve her diet.  She reports her appetite has decreased Meals consist of: breakfast drink and dinner (meat and 2 vegetables but sometimes cannot afford meat, has beans instead). Alcohol intake has decreased to three 12 oz beers/day. Coke intake has decreased to three 32 oz drinks/week.  Patient reports significant knee/back pain that limits her physical activity.  She reports not being able to afford PT, but tries to do exercises at home.   Plan:  Discussed how weight  loss medications would not be covered by her insurance.  Encouraged patient to take daily weights - every morning around the same time. Call if gained more than 3 lbs in one day or 5 lbs in one week.  Discussed optimizing diet: protein, fiber, vegetables, continue to decrease alcohol and coke intake  Discussed the possibility of reapplying for Medicaid.  Continue home PT, using light weights and bands. Consider utilizing the [facility] for chair yoga classes, water aerobics, exercises bikes  Discussed LDL-C goals and smoking cessation.  Start ezetimibe '10mg'$  daily  Continue atorvastatin '80mg'$   Follow-up on May 7th for lab work.  Tobacco abuse Assessment: Patient states quitting smoking is her next hurdle She is pleased that the patches are covered by her insurance Not ready yet to quit Plan: Encouraged the patch and gum together (at least 9 pieces/day).  Emphasized setting a quit date before starting the patch.  Do not smoke with patch on    Thank you,  Wallene Huh, PharmD Candidate    Ramond Dial, Pharm.D, BCPS, CPP [facility] [address], [place], [place] [address]  Phone: [phone]; Fax: [phone]

## 2022-06-12 NOTE — Patient Instructions (Addendum)
Please weight yourself daily every morning Please call us if you gain 3 or more pounds from 1 day to another or 5 or more pounds in a week Start taking ezetimibe '10mg'$  daily Continue taking atorvastatin '80mg'$  daily  Look into joining the [facility] Chair yoga classes, water aerobics, exercises bikes  When you are ready to quit smoking let us know if we can help Set a quit date Use nicotine patches and gum together Chew at least 9 pieces of gum daily for the first week or so

## 2022-06-12 NOTE — Assessment & Plan Note (Signed)
Assessment: Patient states quitting smoking is her next hurdle She is pleased that the patches are covered by her insurance Not ready yet to quit Plan: Encouraged the patch and gum together (at least 9 pieces/day).  Emphasized setting a quit date before starting the patch.  Do not smoke with patch on

## 2022-06-12 NOTE — Assessment & Plan Note (Addendum)
Assessment:  Patient reports she is making changes in her eating/drinking to improve her diet.  She reports her appetite has decreased Meals consist of: breakfast drink and dinner (meat and 2 vegetables but sometimes cannot afford meat, has beans instead). Alcohol intake has decreased to three 12 oz beers/day. Coke intake has decreased to three 32 oz drinks/week.  Patient reports significant knee/back pain that limits her physical activity.  She reports not being able to afford PT, but tries to do exercises at home.   Plan:  Discussed how weight loss medications would not be covered by her insurance.  Encouraged patient to take daily weights - every morning around the same time. Call if gained more than 3 lbs in one day or 5 lbs in one week.  Discussed optimizing diet: protein, fiber, vegetables, continue to decrease alcohol and coke intake  Discussed the possibility of reapplying for Medicaid.  Continue home PT, using light weights and bands. Consider utilizing the [facility] for chair yoga classes, water aerobics, exercises bikes  Discussed LDL-C goals and smoking cessation.  Start ezetimibe '10mg'$  daily  Continue atorvastatin '80mg'$   Follow-up on May 7th for lab work.

## 2022-06-19 ENCOUNTER — Ambulatory Visit: Payer: Medicare Other | Admitting: Pulmonary Disease

## 2022-07-03 ENCOUNTER — Ambulatory Visit: Payer: Medicare Other | Admitting: Orthopedic Surgery

## 2022-07-03 ENCOUNTER — Other Ambulatory Visit (INDEPENDENT_AMBULATORY_CARE_PROVIDER_SITE_OTHER): Payer: Medicare Other

## 2022-07-03 DIAGNOSIS — M545 Low back pain, unspecified: Secondary | ICD-10-CM | POA: Diagnosis not present

## 2022-07-03 DIAGNOSIS — G8929 Other chronic pain: Secondary | ICD-10-CM | POA: Diagnosis not present

## 2022-07-03 NOTE — Progress Notes (Signed)
Orthopedic Spine Surgery Office Note  Assessment: Patient is a 67 y.o. female with acute exacerbation of chronic low back pain after a fall. No radicular leg pain. 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 SVA of 9   Plan: -Explained that initially conservative treatment is tried as a significant number of patients may experience relief with these treatment modalities. Discussed that the conservative treatments include:  -activity modification  -physical therapy  -over the counter pain medications  -medrol dosepak  -lumbar steroid injections -Patient has tried activity modification, Tylenol, ibuprofen, PT, home exercises, pain management -Recommended continue with pain management -Would need to get down to a BMI of 35 and quit all nicotine containing products before elective surgery would be considered -Patient should return to office in 12 weeks, x-rays at next visit: none   Patient expressed understanding of the plan and all questions were answered to the patient's satisfaction.   ___________________________________________________________________________  History: Patient is a 67 y.o. female who has been previously seen in the office for symptoms of low back pain.  Patient has had acute worsening of her back pain after a fall in the hospital.  She said she caught her self with her arm and then nearly landed on her face.  She is significant bruising over her right ear.  She noted her back pain flared up after this fall.  She feels it on the lower back and paraspinal muscles.  It is worse with activity and improves with rest.  She says that the day goes on it feels like a clamp around her lower back.  No pain radiating to the lower extremities.  Has not noticed any weakness.  No bowel or bladder incontinence.  No saddle anesthesia.  Previous treatments: activity modification, Tylenol, ibuprofen, PT, home exercises, pain management  Physical Exam:  General: no acute distress,  appears stated age Neurologic: alert, answering questions appropriately, following commands Respiratory: unlabored breathing on room air, symmetric chest rise Psychiatric: appropriate affect, normal cadence to speech   MSK (spine):  -Strength exam      Left  Right EHL    -/5  -/5 TA    5/5  5/5 GSC    5/5  5/5 Knee extension  5/5  5/5 Hip flexion   5/5  5/5  Unable to test EHL as has had bilateral bunion surgery  -Sensory exam    Sensation intact to light touch in L3-S1 nerve distributions of bilateral lower extremities  -Achilles DTR: 2/4 on the left, 2/4 on the right -Patellar tendon DTR: 2/4 on the left, 2/4 on the right  -Straight leg raise: negative bilaterally -Femoral nerve stretch test: negative bilaterally -Clonus: no beats bilaterally  -Left hip exam: No pain through range of motion, negative Stinchfield, negative FABER -Right hip exam: No pain through range of motion, positive Stinchfield, negative FABER  Imaging: XR of the lumbar spine from 07/03/2022 was independently reviewed and interpreted, showing disc loss at L1/2, L4/5, L5/S1.  Intra osteophyte formation at multiple levels.  No fracture or dislocation seen.  Loss of lumbar lordosis.  PI of 35, LL of 12.  XR scoli from 04/03/2022 was independently reviewed and interpreted, showing PI of 35, LL of 15. SVA of 9cm.    Patient name: Katrina [name] Patient MRN: [id number] Date of visit: 07/03/22

## 2022-07-08 ENCOUNTER — Ambulatory Visit (INDEPENDENT_AMBULATORY_CARE_PROVIDER_SITE_OTHER): Payer: Medicare Other | Admitting: Nurse Practitioner

## 2022-07-08 VITALS — BP 120/71 | HR 62 | Ht 67.0 in | Wt 236.0 lb

## 2022-07-08 DIAGNOSIS — J441 Chronic obstructive pulmonary disease with (acute) exacerbation: Secondary | ICD-10-CM | POA: Diagnosis not present

## 2022-07-08 DIAGNOSIS — I5022 Chronic systolic (congestive) heart failure: Secondary | ICD-10-CM | POA: Diagnosis not present

## 2022-07-08 DIAGNOSIS — Z720 Tobacco use: Secondary | ICD-10-CM

## 2022-07-08 DIAGNOSIS — Z09 Encounter for follow-up examination after completed treatment for conditions other than malignant neoplasm: Secondary | ICD-10-CM

## 2022-07-08 DIAGNOSIS — I10 Essential (primary) hypertension: Secondary | ICD-10-CM

## 2022-07-08 NOTE — Progress Notes (Signed)
Established patient visit   Patient: Katrina [name]   DOB: 04/07/1956   67 y.o. Female  MRN: [id number] Visit Date: 07/08/2022   Chief Complaint  Patient presents with   Hospitalization Follow-up   Subjective    HPI  Hospital follow up  -in hospital from 05/31/2022 through 06/03/2022  -diagnosed with COPD exacerbation.  -chronic systolic heart failure with EF 35-40%.  -left bundle branch block -chronic alcohol abuse.  -losartan discontinued  -continued Breztri -albuterol rescue inhaler.  -taking mucinex -taking claritin  -states that she is feeling much better.  -she has cut back on her smoking.  -she has set Mother's day as her official quit date.   -now seeing pulmonologist at [facility] pulmonology    Medications: Outpatient Medications Prior to Visit  Medication Sig   acetaminophen (TYLENOL) 500 MG tablet Take 1,000 mg by mouth every 6 (six) hours as needed for moderate pain.   albuterol (VENTOLIN HFA) 108 (90 Base) MCG/ACT inhaler Inhale 2 puffs into the lungs every 6 (six) hours as needed for wheezing or shortness of breath.   aspirin EC 81 MG tablet Take 1 tablet (81 mg total) by mouth daily. Swallow whole.   atorvastatin (LIPITOR) 80 MG tablet Take 1 tablet (80 mg total) by mouth daily.   azithromycin (ZITHROMAX) 250 MG tablet Take 1 tablet (250 mg total) by mouth daily.   Budeson-Glycopyrrol-Formoterol (BREZTRI AEROSPHERE) 160-9-4.8 MCG/ACT AERO Inhale 2 puffs into the lungs in the morning and at bedtime.   diclofenac Sodium (VOLTAREN ARTHRITIS PAIN) 1 % GEL Apply 2 g topically 4 (four) times daily as needed (pain).   DULoxetine (CYMBALTA) 20 MG capsule Take 1 capsule (20 mg total) by mouth daily.   empagliflozin (JARDIANCE) 25 MG TABS tablet Take 1 tablet (25 mg total) by mouth daily before breakfast.   fluticasone (FLONASE) 50 MCG/ACT nasal spray Place 2 sprays into both nostrils daily.   metoprolol succinate (TOPROL-XL) 100 MG 24 hr tablet Take 1 tablet (100 mg  total) by mouth daily. Take with or immediately following a meal.   pantoprazole (PROTONIX) 40 MG tablet Take 1 tablet (40 mg total) by mouth at bedtime.   spironolactone (ALDACTONE) 25 MG tablet Take 1 tablet (25 mg total) by mouth at bedtime.   [DISCONTINUED] ezetimibe (ZETIA) 10 MG tablet Take 1 tablet (10 mg total) by mouth daily.   [DISCONTINUED] folic acid (FOLVITE) 1 MG tablet Take 1 tablet (1 mg total) by mouth daily.   [DISCONTINUED] guaiFENesin (MUCINEX) 600 MG 12 hr tablet Take 2 tablets (1,200 mg total) by mouth 2 (two) times daily.   [DISCONTINUED] loratadine (CLARITIN) 10 MG tablet Take 1 tablet (10 mg total) by mouth daily.   [DISCONTINUED] nicotine (NICODERM CQ - DOSED IN MG/24 HOURS) 21 mg/24hr patch Place 1 patch (21 mg total) onto the skin daily as needed (smoking cessation).   No facility-administered medications prior to visit.    Review of Systems See HPI    Last CBC Lab Results  Component Value Date   WBC 20.2 (H) 06/02/2022   HGB 12.4 06/02/2022   HCT 38.6 06/02/2022   MCV 99.2 06/02/2022   MCH 31.9 06/02/2022   RDW 13.1 06/02/2022   PLT 223 06/02/2022   Last metabolic panel Lab Results  Component Value Date   GLUCOSE 144 (H) 06/03/2022   NA 136 06/03/2022   K 4.1 06/03/2022   CL 105 06/03/2022   CO2 24 06/03/2022   BUN 20 06/03/2022   CREATININE 0.82 06/03/2022  GFRNONAA >60 06/03/2022   CALCIUM 9.1 06/03/2022   PHOS 3.2 06/01/2022   PROT 7.0 06/01/2022   ALBUMIN 3.7 06/01/2022   LABGLOB 2.8 07/30/2021   AGRATIO 1.6 07/30/2021   BILITOT 0.6 06/01/2022   ALKPHOS 152 (H) 06/01/2022   AST 34 06/01/2022   ALT 35 06/01/2022   ANIONGAP 7 06/03/2022   Last lipids Lab Results  Component Value Date   CHOL 197 05/02/2022   HDL 55 05/02/2022   LDLCALC 105 (H) 05/02/2022   TRIG 215 (H) 05/02/2022   CHOLHDL 3.6 05/02/2022   Last hemoglobin A1c Lab Results  Component Value Date   HGBA1C 5.5 03/04/2021   Last thyroid functions Lab Results   Component Value Date   TSH 1.110 03/04/2021   Last vitamin D Lab Results  Component Value Date   VD25OH 31.9 03/04/2021       Objective     Today's Vitals   07/08/22 1527 07/08/22 1535  BP: (Abnormal) 158/92 120/71  Pulse: 62   SpO2: 97%   Weight: 236 lb (107 kg)   Height: 5\' 7"  (1.702 m)    Body mass index is 36.96 kg/m.  BP Readings from Last 3 Encounters:  07/21/22 (Abnormal) 130/90  07/14/22 112/68  07/08/22 120/71    Wt Readings from Last 3 Encounters:  07/31/22 233 lb (105.7 kg)  07/21/22 233 lb 6.4 oz (105.9 kg)  07/14/22 232 lb 9.6 oz (105.5 kg)    Physical Exam Vitals and nursing note reviewed.  Constitutional:      Appearance: Normal appearance. She is well-developed.  HENT:     Head: Normocephalic and atraumatic.     Nose: Nose normal.     Mouth/Throat:     Mouth: Mucous membranes are moist.     Pharynx: Oropharynx is clear.  Eyes:     Extraocular Movements: Extraocular movements intact.     Conjunctiva/sclera: Conjunctivae normal.     Pupils: Pupils are equal, round, and reactive to light.  Neck:     Vascular: No carotid bruit.  Cardiovascular:     Rate and Rhythm: Normal rate and regular rhythm.     Pulses: Normal pulses.     Heart sounds: Normal heart sounds.  Pulmonary:     Effort: Pulmonary effort is normal.     Breath sounds: Wheezing present.     Comments: Congested, non-productive cough present  Abdominal:     Palpations: Abdomen is soft.  Musculoskeletal:        General: Normal range of motion.     Cervical back: Normal range of motion and neck supple.  Lymphadenopathy:     Cervical: No cervical adenopathy.  Skin:    General: Skin is warm and dry.     Capillary Refill: Capillary refill takes less than 2 seconds.  Neurological:     General: No focal deficit present.     Mental Status: She is alert and oriented to person, place, and time.  Psychiatric:        Mood and Affect: Mood normal.        Behavior: Behavior normal.         Thought Content: Thought content normal.        Judgment: Judgment normal.      Assessment & Plan    Hospital discharge follow-up  Acute exacerbation of chronic obstructive pulmonary disease (COPD) (HCC) Assessment & Plan: Recent hospitalization for COPD exacerbation.  Improved Continue inhalers and respiratory medication as prescribed  New pulmonology provider  Chronic systolic CHF (congestive heart failure) (HCC) Assessment & Plan: Patient with chronic heart failure. Continues to see cardiology    Tobacco abuse Assessment & Plan: The patient is a current and everyday smoker.    Essential hypertension Assessment & Plan: Blood pressure currently stable.  Continue to hold cozaar per hospital recommendation.  Monitor closely       Return in about 3 months (around 10/07/2022) for mood/copd - , FBW a week prior to visit, needs medicare well visit as soon as possible.Marland Kitchen         Carlean Jews, NP  [facility] [phone] (phone) [phone] (fax)  [facility]

## 2022-07-14 ENCOUNTER — Encounter: Payer: Self-pay | Admitting: Physical Medicine & Rehabilitation

## 2022-07-14 ENCOUNTER — Encounter: Payer: Medicare Other | Attending: Physical Medicine & Rehabilitation | Admitting: Physical Medicine & Rehabilitation

## 2022-07-14 ENCOUNTER — Telehealth: Payer: Self-pay | Admitting: *Deleted

## 2022-07-14 VITALS — BP 112/68 | HR 44 | Ht 67.0 in | Wt 232.6 lb

## 2022-07-14 DIAGNOSIS — F39 Unspecified mood [affective] disorder: Secondary | ICD-10-CM | POA: Diagnosis not present

## 2022-07-14 DIAGNOSIS — G8929 Other chronic pain: Secondary | ICD-10-CM | POA: Diagnosis not present

## 2022-07-14 DIAGNOSIS — Z79891 Long term (current) use of opiate analgesic: Secondary | ICD-10-CM | POA: Diagnosis not present

## 2022-07-14 DIAGNOSIS — M545 Low back pain, unspecified: Secondary | ICD-10-CM | POA: Diagnosis not present

## 2022-07-14 MED ORDER — ACETAMINOPHEN-CODEINE 300-30 MG PO TABS: 1.0000 | ORAL_TABLET | Freq: Two times a day (BID) | ORAL | 0 refills | Status: DC | PRN

## 2022-07-14 MED ORDER — ONDANSETRON HCL 4 MG PO TABS: 4.0000 mg | ORAL_TABLET | Freq: Three times a day (TID) | ORAL | 0 refills | Status: DC | PRN
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
## 2022-07-14 NOTE — Telephone Encounter (Signed)
Pharmacy calling about Tylenol #3 rx sent today. Patient received Oxycodone on 07/11/22 and should still have 2 days left.   Do you want them to still fill this today? Also, Tylenol is going to need a PA because it's over a 7 day supply.  Call back [phone].

## 2022-07-15 NOTE — Telephone Encounter (Signed)
Pharmacy notified per Dr. Natale Lay directions.

## 2022-07-16 ENCOUNTER — Telehealth: Payer: Self-pay

## 2022-07-16 ENCOUNTER — Telehealth: Payer: Self-pay | Admitting: Pulmonary Disease

## 2022-07-16 DIAGNOSIS — Z122 Encounter for screening for malignant neoplasm of respiratory organs: Secondary | ICD-10-CM

## 2022-07-16 DIAGNOSIS — F17210 Nicotine dependence, cigarettes, uncomplicated: Secondary | ICD-10-CM

## 2022-07-16 DIAGNOSIS — Z87891 Personal history of nicotine dependence: Secondary | ICD-10-CM

## 2022-07-16 NOTE — Telephone Encounter (Signed)
PCCM:  Patient is still awaiting repeat low-dose lung cancer screening CT.  Patient had CT scan in February 2024.  However as documented in the impression they nodule that we are following up on is obscured by atelectasis and recommending repeat CT imaging.  "3. Known right lower lobe ground-glass pulmonary nodule is partially obscured by discoid atelectasis on today's examination. Re-evaluation with outpatient routine lung cancer screening CT examination is recommended for continued observation."  There is no reason for the patient to see me next week unless we have repeat imaging that can evaluate the underlying nodule that we are following.  Please ensure CT imaging has been done.  And reschedule patient's appointment.  Patient can see me or SG NP in follow-up.  Josephine Igo, DO [facility] 07/16/2022 10:21 PM

## 2022-07-16 NOTE — Telephone Encounter (Signed)
Question set  PA Case ID #: [id number] Rx #: [id number]

## 2022-07-16 NOTE — Telephone Encounter (Signed)
PA submitted.

## 2022-07-17 NOTE — Telephone Encounter (Signed)
Approved through 07/16/23

## 2022-07-17 NOTE — Telephone Encounter (Signed)
PT is scheduled for [facility] and LDCT 07/18/22. CT results should be available at 07/21/22 appt with Dr Tonia Brooms.

## 2022-07-18 ENCOUNTER — Encounter: Payer: Self-pay | Admitting: Physician Assistant

## 2022-07-18 ENCOUNTER — Ambulatory Visit
Admission: RE | Admit: 2022-07-18 | Discharge: 2022-07-18 | Disposition: A | Discharge status: Home / Self Care | Payer: Medicare Other | Source: Ambulatory Visit | Attending: Acute Care | Admitting: Acute Care

## 2022-07-18 ENCOUNTER — Ambulatory Visit (INDEPENDENT_AMBULATORY_CARE_PROVIDER_SITE_OTHER): Payer: Medicare Other | Admitting: Physician Assistant

## 2022-07-18 ENCOUNTER — Telehealth: Payer: Self-pay | Admitting: Physical Medicine & Rehabilitation

## 2022-07-18 DIAGNOSIS — Z87891 Personal history of nicotine dependence: Secondary | ICD-10-CM

## 2022-07-18 DIAGNOSIS — Z122 Encounter for screening for malignant neoplasm of respiratory organs: Secondary | ICD-10-CM

## 2022-07-18 DIAGNOSIS — F17210 Nicotine dependence, cigarettes, uncomplicated: Secondary | ICD-10-CM | POA: Insufficient documentation

## 2022-07-18 NOTE — Patient Instructions (Signed)
Thank you for participating in the [facility]. It was our pleasure to meet you today. We will call you with the results of your scan within the next few days. Your scan will be assigned a Lung RADS category score by the physicians reading the scans.  This Lung RADS score determines follow up scanning.  See below for description of categories, and follow up screening recommendations. We will be in touch to schedule your follow up screening annually or based on recommendations of our providers. We will fax a copy of your scan results to your Primary Care Physician, or the physician who referred you to the program, to ensure they have the results. Please call the office if you have any questions or concerns regarding your scanning experience or results.  Our office number is [phone]. Please speak with Denise Phelps, RN. , or  Denise Buckner RN, They are  our Lung Cancer Screening RN.'s If They are unavailable when you call, Please leave a message on the voice mail. We will return your call at our earliest convenience.This voice mail is monitored several times a day.  Remember, if your scan is normal, we will scan you annually as long as you continue to meet the criteria for the program. (Age 50-80, Current smoker or smoker who has quit within the last 15 years). If you are a smoker, remember, quitting is the single most powerful action that you can take to decrease your risk of lung cancer and other pulmonary, breathing related problems. We know quitting is hard, and we are here to help.  Please let us know if there is anything we can do to help you meet your goal of quitting. If you are a former smoker, congratulations. We are proud of you! Remain smoke free! Remember you can refer friends or family members through the number above.  We will screen them to make sure they meet criteria for the program. Thank you for helping us take better care of you by  participating in Lung Screening.  You can receive free nicotine replacement therapy ( patches, gum or mints) by calling 1-800-QUIT NOW. Please call so we can get you on the path to becoming  a non-smoker. I know it is hard, but you can do this!  Lung RADS Categories:  Lung RADS 1: no nodules or definitely non-concerning nodules.  Recommendation is for a repeat annual scan in 12 months.  Lung RADS 2:  nodules that are non-concerning in appearance and behavior with a very low likelihood of becoming an active cancer. Recommendation is for a repeat annual scan in 12 months.  Lung RADS 3: nodules that are probably non-concerning , includes nodules with a low likelihood of becoming an active cancer.  Recommendation is for a 6-month repeat screening scan. Often noted after an upper respiratory illness. We will be in touch to make sure you have no questions, and to schedule your 6-month scan.  Lung RADS 4 A: nodules with concerning findings, recommendation is most often for a follow up scan in 3 months or additional testing based on our provider's assessment of the scan. We will be in touch to make sure you have no questions and to schedule the recommended 3 month follow up scan.  Lung RADS 4 B:  indicates findings that are concerning. We will be in touch with you to schedule additional diagnostic testing based on our provider's  assessment of the scan.  Other options for assistance in smoking cessation (   As covered by your insurance benefits)  Hypnosis for smoking cessation  [employer] [phone]  Acupuncture for smoking cessation  [facility] [phone]   

## 2022-07-18 NOTE — Progress Notes (Signed)
Virtual Visit via Telephone Note  I connected with Katrina [name] on 07/18/22 at 12:00 PM EDT by telephone and verified that I am speaking with the correct person using two identifiers.  Location: Patient: home Provider: working virtually from home   I discussed the limitations, risks, security and privacy concerns of performing an evaluation and management service by telephone and the availability of in person appointments. I also discussed with the patient that there may be a patient responsible charge related to this service. The patient expressed understanding and agreed to proceed.      Shared Decision Making Visit Lung Cancer Screening Program [phone])   Eligibility: Age 67 y.o. Pack Years Smoking History Calculation 44 (# packs/per year x # years smoked) Recent History of coughing up blood  no Unexplained weight loss? no ( >Than 15 pounds within the last 6 months ) Prior History Lung / other cancer no (Diagnosis within the last 5 years already requiring surveillance chest CT Scans). Smoking Status Current Smoker   Visit Components: Discussion included one or more decision making aids. yes Discussion included risk/benefits of screening. yes Discussion included potential follow up diagnostic testing for abnormal scans. yes Discussion included meaning and risk of over diagnosis. yes Discussion included meaning and risk of False Positives. yes Discussion included meaning of total radiation exposure. yes  Counseling Included: Importance of adherence to annual lung cancer LDCT screening. yes Impact of comorbidities on ability to participate in the program. yes Ability and willingness to under diagnostic treatment. yes  Smoking Cessation Counseling: Current Smokers:  Discussed importance of smoking cessation. yes Information about tobacco cessation classes and interventions provided to patient. yes Patient provided with "ticket" for LDCT Scan. yes Symptomatic  Patient. no Diagnosis Code: Tobacco Use Z72.0 Asymptomatic Patient yes Diagnosis Code: Personal History of Nicotine Dependence. X38.182 Information about tobacco cessation classes and interventions provided to patient. Yes Patient provided with "ticket" for LDCT Scan. N/a Written Order for Lung Cancer Screening with LDCT placed in Epic. Yes (CT Chest Lung Cancer Screening Low Dose W/O CM) [id number] Z12.2-Screening of respiratory organs Z87.891-Personal history of nicotine dependence  I have spent 25 minutes of face to face/ virtual visit   time with the patient discussing the risks and benefits of lung cancer screening. I provided  her  with smoking cessation  information  with contact information for community resources, classes, free nicotine replacement therapy, and access to mobile apps, text messaging, and on-line smoking cessation help. I have also provided  her  the office contact information in the event she needs to contact me, or the screening staff. We discussed the time and location of the scan, and that either Abigail Miyamoto RN, Karlton Lemon, RN  or I will call / send a letter with the results within 24-72 hours of receiving them. The patient verbalized understanding of all of  the above and had no further questions upon leaving the office. They have my contact information in the event they have any further questions.    I explained to the patient that there has been a high incidence of coronary artery disease noted on these exams. I explained that this is a non-gated exam therefore degree or severity cannot be determined. This patient is on statin therapy. I have asked the patient to follow-up with their PCP regarding any incidental finding of coronary artery disease and management with diet or medication as their PCP  feels is clinically indicated. The patient verbalized understanding of the above and had  no further questions upon completion of the visit.      Darcella Gasman Isam Unrein,  PA-C

## 2022-07-18 NOTE — Telephone Encounter (Signed)
[facility] pharmacy states on patients medical profile it states she is allergic to codene and wanted to notify us. They have reached out to patient to futher inquire on allergic reaction factors.

## 2022-07-21 ENCOUNTER — Encounter: Payer: Self-pay | Admitting: Pulmonary Disease

## 2022-07-21 ENCOUNTER — Ambulatory Visit (INDEPENDENT_AMBULATORY_CARE_PROVIDER_SITE_OTHER): Payer: Medicare Other | Admitting: Pulmonary Disease

## 2022-07-21 VITALS — BP 130/90 | HR 83 | Ht 67.0 in | Wt 233.4 lb

## 2022-07-21 DIAGNOSIS — Z720 Tobacco use: Secondary | ICD-10-CM

## 2022-07-21 DIAGNOSIS — J432 Centrilobular emphysema: Secondary | ICD-10-CM

## 2022-07-21 DIAGNOSIS — F17210 Nicotine dependence, cigarettes, uncomplicated: Secondary | ICD-10-CM

## 2022-07-21 DIAGNOSIS — R918 Other nonspecific abnormal finding of lung field: Secondary | ICD-10-CM | POA: Diagnosis not present

## 2022-07-21 DIAGNOSIS — Z716 Tobacco abuse counseling: Secondary | ICD-10-CM

## 2022-07-21 NOTE — Progress Notes (Signed)
Synopsis: Referred in December 2022 for lung nodules by Carlean Jews, NP  Subjective:   PATIENT ID: Katrina [name] GENDER: female DOB: 23-Mar-1956, MRN: [id number]  Chief Complaint  Patient presents with   Follow-up    Hosp. F/up    67 yo FM, PMH anxiety, asthma (~2 years), GERD, lung infection. Current everyday smoker.  Patient was referred today for evaluation of CT imaging.Patient had a lung cancer screening CT completed on 12/30/2020 this was read as a lung RADS 2.  Patient had several pulmonary nodules identified.  The largest of which was a nonsolid nodule within the anterior right lower lobe measuring about 8.2 mm in size.  She also has associated paraseptal and centrilobular emphysema.  Patient was diagnosed with asthma in the past.  Currently on Flovent and as needed albuterol.  Has been told that she had COPD in the past as well.  She is also being seen by cardiology.  Has plans for a ankle surgery.  OV 07/01/2021: Here today for follow-up after pulmonary function test completed.  Patient had PFTs completed prior to office visit which showed normal ratio, normal FEV1 FVC, normal TLC no evidence of air trapping, normal DLCO.  Patient is breathing better on Breztri inhaler.  She likes using it much better than she did her previous inhalers.  She does have associated paraseptal and centrilobular emphysema on her CT.  But her lung function tests are reassuring.  She would like to stay on her current inhaler regimen as it has got rid of her cough and she is sleeping much better.  She has a lot of stress going on in her life.  She is trying to quit smoking and she is working on this.  Her husband is currently in the hospital needs surgery but his heart is too weak.  Also her daughter is going through some stuff.  So she is trying to help manage the family at this time.  She knows she needs to quit smoking and she is working on it.  OV 07/21/2022: Here today for follow-up after recent  hospitalization.  She was admitted for COPD exacerbation.  Also prior echocardiogram with reduced EF.  She has follow-up with her cardiologist pended.  She taken several medications.  Unfortunately she is still smoking but she does have a quit date set for Mother's Day.  She is working on trying to quit.  She was able to get out of the hospital see the birth of her first grandson.  She is using her inhaler regimen as directed.  Currently using Breztri that she is able to obtain from [facility] and me financial application.    Past Medical History:  Diagnosis Date   Alcohol abuse    Anxiety    Arthritis    knees, hips, elbows    Asthma    hx of asthma 3/12- hospitalized    Chronic systolic heart failure    COPD (chronic obstructive pulmonary disease)    Depression    GERD (gastroesophageal reflux disease)    PRIOR TO HAVING GALLBLADDER REMOVED   Headache    from her neck   Left bundle branch block 06/28/2010   Lung infection 2015   Tobacco abuse      Family History  Problem Relation Age of Onset   Heart disease Mother    High Cholesterol Mother    High blood pressure Mother    Depression Brother    Diabetes Brother      Past  Surgical History:  Procedure Laterality Date   BACK SURGERY     x2   CARPAL TUNNEL RELEASE     left    CESAREAN SECTION     x 2   CHOLECYSTECTOMY     COLONOSCOPY  10/2017   DILATATION & CURETTAGE/HYSTEROSCOPY WITH MYOSURE N/A 12/10/2017   Procedure: DILATATION & CURETTAGE/HYSTEROSCOPY WITH MYOSURE;  Surgeon: Gerald Leitz, MD;  Location: [facility];  Service: Gynecology;  Laterality: N/A;  POSSIBLE MYOMECTOMY VS POLYPECTOMY WITH MYOSURE   DILATION AND CURETTAGE OF UTERUS     ELBOW SURGERY     JOINT REPLACEMENT     KNEE ARTHROSCOPY     bilateral x 2    KNEE ARTHROSCOPY Left 11/03/2013   Procedure: LEFT KNEE ARTHROSCOPY WITH DEBRIDEMENT, PARTIAL SYNOVECTOMY;  Surgeon: Kathryne Hitch, MD;  Location: [facility];  Service: Orthopedics;  Laterality: Left;    LAPAROSCOPY     left elbow surgery      OTHER SURGICAL HISTORY     arthroswcopic surgery right knee    OTHER SURGICAL HISTORY     arthroscopic surgery left knee x 2    OTHER SURGICAL HISTORY     bunionectomy right foot    OTHER SURGICAL HISTORY     C Section x 2    OTHER SURGICAL HISTORY     carpal tunnel on left    POSTERIOR CERVICAL FUSION/FORAMINOTOMY N/A 04/02/2020   Procedure: RIGHT C5-6 FORAMINOTOMY;  Surgeon: Kerrin Champagne, MD;  Location: [facility];  Service: Orthopedics;  Laterality: N/A;   RADIOLOGY WITH ANESTHESIA N/A 07/28/2019   Procedure: MRI WITH ANESTHESIA OF NECK SOFT TISSUE ONLY WITH AND WITHOUT CONTRAST;  Surgeon: Radiologist, Medication, MD;  Location: [facility];  Service: Radiology;  Laterality: N/A;   RADIOLOGY WITH ANESTHESIA N/A 10/04/2019   Procedure: MRI WITH ANESTHESIA RIGHT SHOULDER WITHOUT CONTRAST,MRI OF CERVICAL SPINE WITHOUT CONTRAST;  Surgeon: Radiologist, Medication, MD;  Location: [facility];  Service: Radiology;  Laterality: N/A;   RADIOLOGY WITH ANESTHESIA N/A 08/30/2020   Procedure: MRI WITH ANESTHESIA CERVICAL SPINE WITHOUT CONTRAST;  Surgeon: Radiologist, Medication, MD;  Location: [facility];  Service: Radiology;  Laterality: N/A;   right foot bunionectomy      RIGHT/LEFT HEART CATH AND CORONARY ANGIOGRAPHY N/A 04/26/2021   Procedure: RIGHT/LEFT HEART CATH AND CORONARY ANGIOGRAPHY;  Surgeon: Orbie Pyo, MD;  Location: [facility];  Service: Cardiovascular;  Laterality: N/A;   ROBOTIC ASSISTED TOTAL HYSTERECTOMY WITH BILATERAL SALPINGO OOPHERECTOMY Bilateral 06/16/2018   Procedure: XI ROBOTIC ASSISTED TOTAL HYSTERECTOMY WITH RIGHT SALPINGO OOPHORECTOMY;  Surgeon: Gerald Leitz, MD;  Location: [facility];  Service: Gynecology;  Laterality: Bilateral;   TOTAL HIP ARTHROPLASTY  05/02/2011   Procedure: TOTAL HIP ARTHROPLASTY ANTERIOR APPROACH;  Surgeon: Kathryne Hitch, MD;  Location: [facility];  Service: Orthopedics;  Laterality: Left;  Left Total  Hip Arthroplasty, Direct Anterior Approach   TOTAL KNEE ARTHROPLASTY Right 12/03/2012   Procedure: RIGHT TOTAL KNEE ARTHROPLASTY;  Surgeon: Kathryne Hitch, MD;  Location: [facility];  Service: Orthopedics;  Laterality: Right;   TOTAL KNEE ARTHROPLASTY Left 12/15/2014   Procedure: LEFT TOTAL KNEE ARTHROPLASTY;  Surgeon: Kathryne Hitch, MD;  Location: [facility];  Service: Orthopedics;  Laterality: Left;   TUBAL LIGATION     ULNAR NERVE TRANSPOSITION     left    UPPER GI ENDOSCOPY     x 2    Social History   Socioeconomic History   Marital status: Married  Spouse name: Selena Batten   Number of children: 2   Years of education: GED   Highest education level: GED or equivalent  Occupational History   Occupation: disabled  Tobacco Use   Smoking status: Every Day    Packs/day: 1.00    Years: 44.00    Additional pack years: 0.00    Total pack years: 44.00    Types: Cigarettes   Smokeless tobacco: Never   Tobacco comments:    Smokes 3 1/2 pack of cigarettes a week. 07/21/22 Tay.  Vaping Use   Vaping Use: Never used  Substance and Sexual Activity   Alcohol use: Yes    Alcohol/week: 49.0 standard drinks of alcohol    Types: 49 Cans of beer per week   Drug use: No    Comment: hx of 34 years ago marijuana    Sexual activity: Not Currently  Other Topics Concern   Not on file  Social History Narrative   Not on file   Social Determinants of Health   Financial Resource Strain: Medium Risk (07/08/2022)   Overall Financial Resource Strain (CARDIA)    Difficulty of Paying Living Expenses: Somewhat hard  Food Insecurity: No Food Insecurity (07/08/2022)   Hunger Vital Sign    Worried About Running Out of Food in the Last Year: Never true    Ran Out of Food in the Last Year: Never true  Transportation Needs: No Transportation Needs (07/08/2022)   PRAPARE - Administrator, Civil Service (Medical): No    Lack of Transportation (Non-Medical): No  Physical Activity: Insufficiently  Active (07/08/2022)   Exercise Vital Sign    Days of Exercise per Week: 1 day    Minutes of Exercise per Session: 60 min  Stress: No Stress Concern Present (07/08/2022)   [employer] of Occupational Health - Occupational Stress Questionnaire    Feeling of Stress : Only a little  Social Connections: Moderately Isolated (07/08/2022)   Social Connection and Isolation Panel [NHANES]    Frequency of Communication with Friends and Family: More than three times a week    Frequency of Social Gatherings with Friends and Family: More than three times a week    Attends Religious Services: Never    Database administrator or Organizations: No    Attends Engineer, structural: Not on file    Marital Status: Married  Catering manager Violence: Not At Risk (06/01/2022)   Humiliation, Afraid, Rape, and Kick questionnaire    Fear of Current or Ex-Partner: No    Emotionally Abused: No    Physically Abused: No    Sexually Abused: No     Allergies  Allergen Reactions   Codeine Itching and Nausea Only     Outpatient Medications Prior to Visit  Medication Sig Dispense Refill   acetaminophen (TYLENOL) 500 MG tablet Take 1,000 mg by mouth every 6 (six) hours as needed for moderate pain.     acetaminophen-codeine (TYLENOL #3) 300-30 MG tablet Take 1 tablet by mouth every 12 (twelve) hours as needed for moderate pain. 60 tablet 0   albuterol (VENTOLIN HFA) 108 (90 Base) MCG/ACT inhaler Inhale 2 puffs into the lungs every 6 (six) hours as needed for wheezing or shortness of breath. 6.7 g 2   aspirin EC 81 MG tablet Take 1 tablet (81 mg total) by mouth daily. Swallow whole. 90 tablet 3   atorvastatin (LIPITOR) 80 MG tablet Take 1 tablet (80 mg total) by mouth daily. 90 tablet 3  azithromycin (ZITHROMAX) 250 MG tablet Take 1 tablet (250 mg total) by mouth daily. 3 each 0   Budeson-Glycopyrrol-Formoterol (BREZTRI AEROSPHERE) 160-9-4.8 MCG/ACT AERO Inhale 2 puffs into the lungs in the morning and at  bedtime. 10.7 g 11   diclofenac Sodium (VOLTAREN ARTHRITIS PAIN) 1 % GEL Apply 2 g topically 4 (four) times daily as needed (pain).     DULoxetine (CYMBALTA) 20 MG capsule Take 1 capsule (20 mg total) by mouth daily. 30 capsule 2   empagliflozin (JARDIANCE) 25 MG TABS tablet Take 1 tablet (25 mg total) by mouth daily before breakfast. 90 tablet 3   fluticasone (FLONASE) 50 MCG/ACT nasal spray Place 2 sprays into both nostrils daily. 16 g 2   metoprolol succinate (TOPROL-XL) 100 MG 24 hr tablet Take 1 tablet (100 mg total) by mouth daily. Take with or immediately following a meal. 90 tablet 3   ondansetron (ZOFRAN) 4 MG tablet Take 1 tablet (4 mg total) by mouth every 8 (eight) hours as needed for nausea or vomiting. 60 tablet 0   pantoprazole (PROTONIX) 40 MG tablet Take 1 tablet (40 mg total) by mouth at bedtime. 30 tablet 0   spironolactone (ALDACTONE) 25 MG tablet Take 1 tablet (25 mg total) by mouth at bedtime. 90 tablet 3   ezetimibe (ZETIA) 10 MG tablet Take 1 tablet (10 mg total) by mouth daily. 90 tablet 3   guaiFENesin (MUCINEX) 600 MG 12 hr tablet Take 2 tablets (1,200 mg total) by mouth 2 (two) times daily. 60 tablet 1   nicotine (NICODERM CQ - DOSED IN MG/24 HOURS) 21 mg/24hr patch Place 1 patch (21 mg total) onto the skin daily as needed (smoking cessation). 28 patch 0   No facility-administered medications prior to visit.    Review of Systems  Constitutional:  Negative for chills, fever, malaise/fatigue and weight loss.  HENT:  Negative for hearing loss, sore throat and tinnitus.   Eyes:  Negative for blurred vision and double vision.  Respiratory:  Positive for cough, sputum production and shortness of breath. Negative for hemoptysis, wheezing and stridor.   Cardiovascular:  Negative for chest pain, palpitations, orthopnea, leg swelling and PND.  Gastrointestinal:  Negative for abdominal pain, constipation, diarrhea, heartburn, nausea and vomiting.  Genitourinary:  Negative for  dysuria, hematuria and urgency.  Musculoskeletal:  Negative for joint pain and myalgias.  Skin:  Negative for itching and rash.  Neurological:  Negative for dizziness, tingling, weakness and headaches.  Endo/Heme/Allergies:  Negative for environmental allergies. Does not bruise/bleed easily.  Psychiatric/Behavioral:  Negative for depression. The patient is not nervous/anxious and does not have insomnia.   All other systems reviewed and are negative.    Objective:  Physical Exam Vitals reviewed.  Constitutional:      General: She is not in acute distress.    Appearance: She is well-developed.  HENT:     Head: Normocephalic and atraumatic.  Eyes:     General: No scleral icterus.    Conjunctiva/sclera: Conjunctivae normal.     Pupils: Pupils are equal, round, and reactive to light.  Neck:     Vascular: No JVD.     Trachea: No tracheal deviation.  Cardiovascular:     Rate and Rhythm: Normal rate and regular rhythm.     Heart sounds: Normal heart sounds. No murmur heard. Pulmonary:     Effort: Pulmonary effort is normal. No tachypnea, accessory muscle usage or respiratory distress.     Breath sounds: No stridor. No wheezing, rhonchi or  rales.     Comments: trace lower ext edema  Abdominal:     General: There is no distension.     Palpations: Abdomen is soft.     Tenderness: There is no abdominal tenderness.  Musculoskeletal:        General: No tenderness.     Cervical back: Neck supple.  Lymphadenopathy:     Cervical: No cervical adenopathy.  Skin:    General: Skin is warm and dry.     Capillary Refill: Capillary refill takes less than 2 seconds.     Findings: No rash.  Neurological:     Mental Status: She is alert and oriented to person, place, and time.  Psychiatric:        Behavior: Behavior normal.      Vitals:   07/21/22 1422  BP: (!) 130/90  Pulse: 83  SpO2: 96%  Weight: 233 lb 6.4 oz (105.9 kg)  Height:  (1.702 m)   96% on RA BMI Readings from Last  3 Encounters:  07/21/22 36.56 kg/m  07/14/22 36.43 kg/m  07/08/22 36.96 kg/m   Wt Readings from Last 3 Encounters:  07/21/22 233 lb 6.4 oz (105.9 kg)  07/14/22 232 lb 9.6 oz (105.5 kg)  07/08/22 236 lb (107 kg)     CBC    Component Value Date/Time   WBC 20.2 (H) 06/02/2022 0135   RBC 3.89 06/02/2022 0135   HGB 12.4 06/02/2022 0135   HGB 14.5 04/23/2021 1019   HCT 38.6 06/02/2022 0135   HCT 41.0 04/23/2021 1019   PLT 223 06/02/2022 0135   PLT 258 04/23/2021 1019   MCV 99.2 06/02/2022 0135   MCV 93 04/23/2021 1019   MCH 31.9 06/02/2022 0135   MCHC 32.1 06/02/2022 0135   RDW 13.1 06/02/2022 0135   RDW 14.3 04/23/2021 1019   LYMPHSABS 2.3 06/01/2022 0005   LYMPHSABS 3.7 (H) 03/22/2021 0900   MONOABS 1.0 06/01/2022 0005   EOSABS 0.1 06/01/2022 0005   EOSABS 0.3 03/22/2021 0900   BASOSABS 0.1 06/01/2022 0005   BASOSABS 0.1 03/22/2021 0900      Chest Imaging: 12/27/2020 lung cancer screening CT: Right lower lobe 8 mm lung nodule, associated paraseptal and centrilobular emphysema. The patient's images have been independently reviewed by me.    April 2024: LDCT read pending  Nodules are stable.  The patient's images have been independently reviewed by me.    Pulmonary Functions Testing Results:    Latest Ref Rng & Units 07/01/2021    8:56 AM  PFT Results  FVC-Pre L 3.61   FVC-Predicted Pre % 102   FVC-Post L 3.73   FVC-Predicted Post % 105   Pre FEV1/FVC % % 74   Post FEV1/FCV % % 75   FEV1-Pre L 2.67   FEV1-Predicted Pre % 98   FEV1-Post L 2.78   DLCO uncorrected ml/min/mmHg 22.04   DLCO UNC% % 100   DLCO corrected ml/min/mmHg 22.04   DLCO COR %Predicted % 100   DLVA Predicted % 108   TLC L 6.14   TLC % Predicted % 111   RV % Predicted % 118     FeNO:   Pathology:   Echocardiogram:   Heart Catheterization:     Assessment & Plan:     ICD-10-CM   1. Cigarette smoker  F17.210     2. Tobacco abuse  Z72.0     3. Centrilobular emphysema   J43.2     4. Lung nodules  R91.8  5. Encounter for smoking cessation counseling  Z71.6       Discussion: This is a 67 year old female seen abuse, centrilobular, paraseptal emphysema on CT imaging, lung function test reassuring with no severe obstruction.  Had prior CT imaging with nodule that had slightly decreased in size.  She reenrolled in lung cancer screening at her last scan last week.  This shows stability of the lesion.  Final read has not been complete.  Plan: Quit date set for Mother's Day 2024. Smoking cessation counseling today in the office. Continue Breztri inhaler. Follow-up lung cancer screening CT in 1 year, April 2024. RTC in 1 year or as needed.     Current Outpatient Medications:    acetaminophen (TYLENOL) 500 MG tablet, Take 1,000 mg by mouth every 6 (six) hours as needed for moderate pain., Disp: , Rfl:    acetaminophen-codeine (TYLENOL #3) 300-30 MG tablet, Take 1 tablet by mouth every 12 (twelve) hours as needed for moderate pain., Disp: 60 tablet, Rfl: 0   albuterol (VENTOLIN HFA) 108 (90 Base) MCG/ACT inhaler, Inhale 2 puffs into the lungs every 6 (six) hours as needed for wheezing or shortness of breath., Disp: 6.7 g, Rfl: 2   aspirin EC 81 MG tablet, Take 1 tablet (81 mg total) by mouth daily. Swallow whole., Disp: 90 tablet, Rfl: 3   atorvastatin (LIPITOR) 80 MG tablet, Take 1 tablet (80 mg total) by mouth daily., Disp: 90 tablet, Rfl: 3   azithromycin (ZITHROMAX) 250 MG tablet, Take 1 tablet (250 mg total) by mouth daily., Disp: 3 each, Rfl: 0   Budeson-Glycopyrrol-Formoterol (BREZTRI AEROSPHERE) 160-9-4.8 MCG/ACT AERO, Inhale 2 puffs into the lungs in the morning and at bedtime., Disp: 10.7 g, Rfl: 11   diclofenac Sodium (VOLTAREN ARTHRITIS PAIN) 1 % GEL, Apply 2 g topically 4 (four) times daily as needed (pain)., Disp: , Rfl:    DULoxetine (CYMBALTA) 20 MG capsule, Take 1 capsule (20 mg total) by mouth daily., Disp: 30 capsule, Rfl: 2   empagliflozin  (JARDIANCE) 25 MG TABS tablet, Take 1 tablet (25 mg total) by mouth daily before breakfast., Disp: 90 tablet, Rfl: 3   fluticasone (FLONASE) 50 MCG/ACT nasal spray, Place 2 sprays into both nostrils daily., Disp: 16 g, Rfl: 2   metoprolol succinate (TOPROL-XL) 100 MG 24 hr tablet, Take 1 tablet (100 mg total) by mouth daily. Take with or immediately following a meal., Disp: 90 tablet, Rfl: 3   ondansetron (ZOFRAN) 4 MG tablet, Take 1 tablet (4 mg total) by mouth every 8 (eight) hours as needed for nausea or vomiting., Disp: 60 tablet, Rfl: 0   pantoprazole (PROTONIX) 40 MG tablet, Take 1 tablet (40 mg total) by mouth at bedtime., Disp: 30 tablet, Rfl: 0   spironolactone (ALDACTONE) 25 MG tablet, Take 1 tablet (25 mg total) by mouth at bedtime., Disp: 90 tablet, Rfl: 3   Josephine Igo, DO [facility] 07/21/2022 2:51 PM

## 2022-07-21 NOTE — Telephone Encounter (Signed)
Katrina [name] (Key: [id number]) - [id number] Acetaminophen-Codeine 300-30MG tablets Status: PA Response - ApprovedCreated: April 10th, 2024 [id number]: April 10th, 2024 

## 2022-07-21 NOTE — Telephone Encounter (Signed)
Katrina [name] (Key: [id number]) [phone] Acetaminophen-Codeine 300-30MG  tablets Status: PA Response - ApprovedCreated: April 10th, 2024 [id number] Sent: April 10th, 2024

## 2022-07-21 NOTE — Patient Instructions (Signed)
Thank you for visiting Dr. Tonia Brooms at [facility]. Today we recommend the following:  Continue breztri inhaler regimen  Yay for quitting smoking!  Return in about 1 year (around 07/21/2023) for with Kandice Robinsons, NP, or Dr. Tonia Brooms.    Please do your part to reduce the spread of COVID-19.

## 2022-07-23 ENCOUNTER — Other Ambulatory Visit: Payer: Self-pay

## 2022-07-23 DIAGNOSIS — F17210 Nicotine dependence, cigarettes, uncomplicated: Secondary | ICD-10-CM

## 2022-07-23 DIAGNOSIS — Z87891 Personal history of nicotine dependence: Secondary | ICD-10-CM

## 2022-07-25 ENCOUNTER — Telehealth: Payer: Self-pay | Admitting: *Deleted

## 2022-07-25 NOTE — Telephone Encounter (Signed)
LVM asking pt to call office to see if she would be interested in changing her AWV to virtual with the AWV team.  It could be done on Thursday sometime if she would like to do this.

## 2022-07-28 NOTE — Telephone Encounter (Signed)
Patient called office returning call, patient stated that she could do virtual but would like to know details, if visit is a telephone call or video visit, please advise, thanks.

## 2022-07-31 ENCOUNTER — Ambulatory Visit (INDEPENDENT_AMBULATORY_CARE_PROVIDER_SITE_OTHER): Payer: Medicare Other

## 2022-07-31 ENCOUNTER — Encounter: Payer: Medicare Other | Admitting: Nurse Practitioner

## 2022-07-31 VITALS — Ht 67.0 in | Wt 233.0 lb

## 2022-07-31 DIAGNOSIS — Z0000 Encounter for general adult medical examination without abnormal findings: Secondary | ICD-10-CM

## 2022-07-31 NOTE — Progress Notes (Signed)
Subjective:   Katrina [name] is a 67 y.o. female who presents for Medicare Annual (Subsequent) preventive examination.  Review of Systems    Virtual Visit via Telephone Note  I connected with  Katrina [name] on 07/31/22 at  1:00 PM EDT by telephone and verified that I am speaking with the correct person using two identifiers.  Location: Patient: Home Provider: Office Persons participating in the virtual visit: patient/Nurse Health Advisor   I discussed the limitations, risks, security and privacy concerns of performing an evaluation and management service by telephone and the availability of in person appointments. The patient expressed understanding and agreed to proceed.  Interactive audio and video telecommunications were attempted between this nurse and patient, however failed, due to patient having technical difficulties OR patient did not have access to video capability.  We continued and completed visit with audio only.  Some vital signs may be absent or patient reported.   Tillie Rung, LPN  Cardiac Risk Factors include: advanced age (>53men, >67 women)     Objective:    Today's Vitals   07/31/22 1305  Weight: 233 lb (105.7 kg)  Height: 5\' 7"  (1.702 m)   Body mass index is 36.49 kg/m.     07/31/2022    1:18 PM 06/01/2022    2:56 PM 05/31/2022    4:44 PM 03/05/2022    1:08 PM 04/26/2021    6:25 AM 09/26/2020    8:14 AM 03/29/2020   10:51 AM  Advanced Directives  Does Patient Have a Medical Advance Directive? No  No No No No No  Would patient like information on creating a medical advance directive? No - Patient declined No - Patient declined  No - Patient declined No - Patient declined      Current Medications (verified) Outpatient Encounter Medications as of 07/31/2022  Medication Sig   acetaminophen (TYLENOL) 500 MG tablet Take 1,000 mg by mouth every 6 (six) hours as needed for moderate pain.   acetaminophen-codeine (TYLENOL #3) 300-30 MG tablet Take  1 tablet by mouth every 12 (twelve) hours as needed for moderate pain.   albuterol (VENTOLIN HFA) 108 (90 Base) MCG/ACT inhaler Inhale 2 puffs into the lungs every 6 (six) hours as needed for wheezing or shortness of breath.   aspirin EC 81 MG tablet Take 1 tablet (81 mg total) by mouth daily. Swallow whole.   atorvastatin (LIPITOR) 80 MG tablet Take 1 tablet (80 mg total) by mouth daily.   azithromycin (ZITHROMAX) 250 MG tablet Take 1 tablet (250 mg total) by mouth daily.   Budeson-Glycopyrrol-Formoterol (BREZTRI AEROSPHERE) 160-9-4.8 MCG/ACT AERO Inhale 2 puffs into the lungs in the morning and at bedtime.   diclofenac Sodium (VOLTAREN ARTHRITIS PAIN) 1 % GEL Apply 2 g topically 4 (four) times daily as needed (pain).   DULoxetine (CYMBALTA) 20 MG capsule Take 1 capsule (20 mg total) by mouth daily.   empagliflozin (JARDIANCE) 25 MG TABS tablet Take 1 tablet (25 mg total) by mouth daily before breakfast.   fluticasone (FLONASE) 50 MCG/ACT nasal spray Place 2 sprays into both nostrils daily.   metoprolol succinate (TOPROL-XL) 100 MG 24 hr tablet Take 1 tablet (100 mg total) by mouth daily. Take with or immediately following a meal.   ondansetron (ZOFRAN) 4 MG tablet Take 1 tablet (4 mg total) by mouth every 8 (eight) hours as needed for nausea or vomiting.   pantoprazole (PROTONIX) 40 MG tablet Take 1 tablet (40 mg total) by mouth at bedtime.  spironolactone (ALDACTONE) 25 MG tablet Take 1 tablet (25 mg total) by mouth at bedtime.   [DISCONTINUED] clonazePAM (KLONOPIN) 0.5 MG tablet Take 1 tablet (0.5 mg total) by mouth 2 (two) times daily as needed for anxiety.   No facility-administered encounter medications on file as of 07/31/2022.    Allergies (verified) Codeine   History: Past Medical History:  Diagnosis Date   Alcohol abuse    Anxiety    Arthritis    knees, hips, elbows    Asthma    hx of asthma 3/12- hospitalized    Chronic systolic heart failure    COPD (chronic obstructive  pulmonary disease)    Depression    GERD (gastroesophageal reflux disease)    PRIOR TO HAVING GALLBLADDER REMOVED   Headache    from her neck   Left bundle branch block 06/28/2010   Lung infection 2015   Tobacco abuse    Past Surgical History:  Procedure Laterality Date   BACK SURGERY     x2   CARPAL TUNNEL RELEASE     left    CESAREAN SECTION     x 2   CHOLECYSTECTOMY     COLONOSCOPY  10/2017   DILATATION & CURETTAGE/HYSTEROSCOPY WITH MYOSURE N/A 12/10/2017   Procedure: DILATATION & CURETTAGE/HYSTEROSCOPY WITH MYOSURE;  Surgeon: Gerald Leitz, MD;  Location: [facility];  Service: Gynecology;  Laterality: N/A;  POSSIBLE MYOMECTOMY VS POLYPECTOMY WITH MYOSURE   DILATION AND CURETTAGE OF UTERUS     ELBOW SURGERY     JOINT REPLACEMENT     KNEE ARTHROSCOPY     bilateral x 2    KNEE ARTHROSCOPY Left 11/03/2013   Procedure: LEFT KNEE ARTHROSCOPY WITH DEBRIDEMENT, PARTIAL SYNOVECTOMY;  Surgeon: Kathryne Hitch, MD;  Location: [facility];  Service: Orthopedics;  Laterality: Left;   LAPAROSCOPY     left elbow surgery      OTHER SURGICAL HISTORY     arthroswcopic surgery right knee    OTHER SURGICAL HISTORY     arthroscopic surgery left knee x 2    OTHER SURGICAL HISTORY     bunionectomy right foot    OTHER SURGICAL HISTORY     C Section x 2    OTHER SURGICAL HISTORY     carpal tunnel on left    POSTERIOR CERVICAL FUSION/FORAMINOTOMY N/A 04/02/2020   Procedure: RIGHT C5-6 FORAMINOTOMY;  Surgeon: Kerrin Champagne, MD;  Location: [facility];  Service: Orthopedics;  Laterality: N/A;   RADIOLOGY WITH ANESTHESIA N/A 07/28/2019   Procedure: MRI WITH ANESTHESIA OF NECK SOFT TISSUE ONLY WITH AND WITHOUT CONTRAST;  Surgeon: Radiologist, Medication, MD;  Location: [facility];  Service: Radiology;  Laterality: N/A;   RADIOLOGY WITH ANESTHESIA N/A 10/04/2019   Procedure: MRI WITH ANESTHESIA RIGHT SHOULDER WITHOUT CONTRAST,MRI OF CERVICAL SPINE WITHOUT CONTRAST;  Surgeon: Radiologist, Medication, MD;  Location: [facility];  Service: Radiology;  Laterality: N/A;   RADIOLOGY WITH ANESTHESIA N/A 08/30/2020   Procedure: MRI WITH ANESTHESIA CERVICAL SPINE WITHOUT CONTRAST;  Surgeon: Radiologist, Medication, MD;  Location: [facility];  Service: Radiology;  Laterality: N/A;   right foot bunionectomy      RIGHT/LEFT HEART CATH AND CORONARY ANGIOGRAPHY N/A 04/26/2021   Procedure: RIGHT/LEFT HEART CATH AND CORONARY ANGIOGRAPHY;  Surgeon: Orbie Pyo, MD;  Location: [facility];  Service: Cardiovascular;  Laterality: N/A;   ROBOTIC ASSISTED TOTAL HYSTERECTOMY WITH BILATERAL SALPINGO OOPHERECTOMY Bilateral 06/16/2018   Procedure: XI ROBOTIC ASSISTED TOTAL HYSTERECTOMY WITH RIGHT SALPINGO OOPHORECTOMY;  Surgeon:  Gerald Leitz, MD;  Location: [facility];  Service: Gynecology;  Laterality: Bilateral;   TOTAL HIP ARTHROPLASTY  05/02/2011   Procedure: TOTAL HIP ARTHROPLASTY ANTERIOR APPROACH;  Surgeon: Kathryne Hitch, MD;  Location: [facility];  Service: Orthopedics;  Laterality: Left;  Left Total Hip Arthroplasty, Direct Anterior Approach   TOTAL KNEE ARTHROPLASTY Right 12/03/2012   Procedure: RIGHT TOTAL KNEE ARTHROPLASTY;  Surgeon: Kathryne Hitch, MD;  Location: [facility];  Service: Orthopedics;  Laterality: Right;   TOTAL KNEE ARTHROPLASTY Left 12/15/2014   Procedure: LEFT TOTAL KNEE ARTHROPLASTY;  Surgeon: Kathryne Hitch, MD;  Location: [facility];  Service: Orthopedics;  Laterality: Left;   TUBAL LIGATION     ULNAR NERVE TRANSPOSITION     left    UPPER GI ENDOSCOPY     x 2   Family History  Problem Relation Age of Onset   Heart disease Mother    High Cholesterol Mother    High blood pressure Mother    Depression Brother    Diabetes Brother    Social History   Socioeconomic History   Marital status: Married    Spouse name: [name]   Number of children: 2   Years of education: GED   Highest education level: GED or equivalent  Occupational History   Occupation: disabled  Tobacco Use    Smoking status: Every Day    Packs/day: 1.00    Years: 44.00    Additional pack years: 0.00    Total pack years: 44.00    Types: Cigarettes   Smokeless tobacco: Never   Tobacco comments:    Smokes 3 1/2 pack of cigarettes a week. 07/21/22 Tay.  Vaping Use   Vaping Use: Never used  Substance and Sexual Activity   Alcohol use: Yes    Alcohol/week: 49.0 standard drinks of alcohol    Types: 49 Cans of beer per week   Drug use: No    Comment: hx of 34 years ago marijuana    Sexual activity: Not Currently  Other Topics Concern   Not on file  Social History Narrative   Not on file   Social Determinants of Health   Financial Resource Strain: Low Risk  (07/31/2022)   Overall Financial Resource Strain (CARDIA)    Difficulty of Paying Living Expenses: Not hard at all  Recent Concern: Financial Resource Strain - Medium Risk (07/08/2022)   Overall Financial Resource Strain (CARDIA)    Difficulty of Paying Living Expenses: Somewhat hard  Food Insecurity: No Food Insecurity (07/31/2022)   Hunger Vital Sign    Worried About Running Out of Food in the Last Year: Never true    Ran Out of Food in the Last Year: Never true  Transportation Needs: No Transportation Needs (07/31/2022)   PRAPARE - Administrator, Civil Service (Medical): No    Lack of Transportation (Non-Medical): No  Physical Activity: Inactive (07/31/2022)   Exercise Vital Sign    Days of Exercise per Week: 0 days    Minutes of Exercise per Session: 0 min  Stress: No Stress Concern Present (07/31/2022)   [employer] of Occupational Health - Occupational Stress Questionnaire    Feeling of Stress : Not at all  Social Connections: Moderately Isolated (07/31/2022)   Social Connection and Isolation Panel [NHANES]    Frequency of Communication with Friends and Family: More than three times a week    Frequency of Social Gatherings with Friends and Family: More than three times a week  Attends Religious Services: Never     Active Member of Clubs or Organizations: No    Attends Banker Meetings: Never    Marital Status: Married    Tobacco Counseling Ready to quit: Yes Counseling given: Yes Tobacco comments: Smokes 3 1/2 pack of cigarettes a week. 07/21/22 Tay.   Clinical Intake:  Pre-visit preparation completed: No  Pain : No/denies pain     BMI - recorded: 36.49 Nutritional Status: BMI > 30  Obese Nutritional Risks: None Diabetes: No  How often do you need to have someone help you when you read instructions, pamphlets, or other written materials from your doctor or pharmacy?: 1 - Never  Diabetic?  No  Interpreter Needed?: No  Information entered by :: Theresa Mulligan LPN   Activities of Daily Living    07/31/2022    1:15 PM 06/01/2022    2:00 PM  In your present state of health, do you have any difficulty performing the following activities:  Hearing? 0 0  Vision? 0 0  Difficulty concentrating or making decisions? 0 0  Walking or climbing stairs? 0 1  Dressing or bathing? 0 0  Doing errands, shopping? 0 0  Preparing Food and eating ? N   Using the Toilet? N   In the past six months, have you accidently leaked urine? Y   Comment Wears pads. Followed by PCP   Do you have problems with loss of bowel control? N   Managing your Medications? N   Managing your Finances? N   Housekeeping or managing your Housekeeping? N     Patient Care Team: Carlean Jews, NP as PCP - General (Family Medicine) Orbie Pyo, MD as PCP - Cardiology (Cardiology)  Indicate any recent Medical Services you may have received from other than [facility] providers in the past year (date may be approximate).     Assessment:   This is a routine wellness examination for Zana.  Hearing/Vision screen Hearing Screening - Comments:: Denies hearing difficulties   Vision Screening - Comments:: Wears rx glasses - up to date with routine eye exams with  Deferred  Dietary issues and exercise  activities discussed: Exercise limited by: None identified   Goals Addressed               This Visit's Progress     Get healthy (pt-stated)        Quit smoking and Live longer.       Depression Screen    07/31/2022    1:12 PM 07/08/2022    3:30 PM 05/30/2022   11:27 AM 05/02/2022    1:25 PM 03/07/2021    3:04 PM 12/05/2020   11:22 AM  PHQ 2/9 Scores  PHQ - 2 Score 0 0 4 2 2 2   PHQ- 9 Score 0 11  7 5 10     Fall Risk    07/31/2022    1:16 PM 07/08/2022    3:30 PM 05/30/2022   11:27 AM 05/02/2022    1:25 PM 03/07/2021    3:03 PM  Fall Risk   Falls in the past year? 1 1 0 1 0  Number falls in past yr: 0 0 0 0 0  Injury with Fall? 1 1 0 1 0  Comment Bruised rt ear, followed by medical attention   right side of body and back   Risk for fall due to : No Fall Risks    History of fall(s)  Follow up Falls prevention discussed Falls  evaluation completed   Falls evaluation completed    FALL RISK PREVENTION PERTAINING TO THE HOME:  Any stairs in or around the home? No If so, are there any without handrails? No  Home free of loose throw rugs in walkways, pet beds, electrical cords, etc? Yes  Adequate lighting in your home to reduce risk of falls? Yes   ASSISTIVE DEVICES UTILIZED TO PREVENT FALLS:  Life alert? No  Use of a cane, walker or w/c? No  Grab bars in the bathroom? Yes  Shower chair or bench in shower? Yes  Elevated toilet seat or a handicapped toilet? Yes   TIMED UP AND GO:  Was the test performed? No . Audio Visit   Cognitive Function:        07/31/2022    1:18 PM 03/07/2021    3:06 PM  6CIT Screen  What Year? 0 points 0 points  What month? 0 points 0 points  What time? 0 points 0 points  Count back from 20 0 points 0 points  Months in reverse 0 points 2 points  Repeat phrase 0 points 0 points  Total Score 0 points 2 points    Immunizations Immunization History  Administered Date(s) Administered   Influenza, Quadrivalent, Recombinant, Inj, Pf  03/12/2018   Moderna SARS-COV2 Booster Vaccination 04/26/2020   Moderna Sars-Covid-2 Vaccination 09/10/2019, 10/19/2019   Tdap 04/21/2014    TDAP status: Up to date  Flu Vaccine status: Up to date  Pneumococcal vaccine status: Due, Education has been provided regarding the importance of this vaccine. Advised may receive this vaccine at local pharmacy or Health Dept. Aware to provide a copy of the vaccination record if obtained from local pharmacy or Health Dept. Verbalized acceptance and understanding.  Covid-19 vaccine status: Completed vaccines  Qualifies for Shingles Vaccine? Yes   Zostavax completed No   Shingrix Completed?: No.    Education has been provided regarding the importance of this vaccine. Patient has been advised to call insurance company to determine out of pocket expense if they have not yet received this vaccine. Advised may also receive vaccine at local pharmacy or Health Dept. Verbalized acceptance and understanding.  Screening Tests Health Maintenance  Topic Date Due   COVID-19 Vaccine (4 - 2023-24 season) 08/16/2022 (Originally 12/06/2021)   Zoster Vaccines- Shingrix (1 of 2) 10/30/2022 (Originally 01/27/2006)   Pneumonia Vaccine 6+ Years old (1 of 2 - PCV) 07/31/2023 (Originally 01/27/1962)   MAMMOGRAM  07/31/2023 (Originally 02/13/2017)   DEXA SCAN  07/31/2023 (Originally 01/27/2021)   COLONOSCOPY (Pts 45-45yrs Insurance coverage will need to be confirmed)  07/31/2023 (Originally 01/27/2001)   Hepatitis C Screening  07/31/2023 (Originally 01/27/1974)   INFLUENZA VACCINE  11/06/2022   Lung Cancer Screening  07/18/2023   Medicare Annual Wellness (AWV)  07/31/2023   DTaP/Tdap/Td (2 - Td or Tdap) 04/21/2024   HPV VACCINES  Aged Out    Health Maintenance  There are no preventive care reminders to display for this patient.   Colorectal cancer screening: Referral to GI placed Patient deferred. Pt aware the office will call re: appt.  Mammogram status: Ordered  Patient deferred. Pt provided with contact info and advised to call to schedule appt.   Bone Density status: Ordered Patient deferred. Pt provided with contact info and advised to call to schedule appt.  Lung Cancer Screening: (Low Dose CT Chest recommended if Age 15-80 years, 30 pack-year currently smoking OR have quit w/in 15years.) does qualify.   Lung Cancer Screening Referral: Deferred  Additional Screening:  Hepatitis C Screening: does qualify;  Deferred  Vision Screening: Recommended annual ophthalmology exams for early detection of glaucoma and other disorders of the eye. Is the patient up to date with their annual eye exam?  Yes  Who is the provider or what is the name of the office in which the patient attends annual eye exams? Deferred If pt is not established with a provider, would they like to be referred to a provider to establish care? No .   Dental Screening: Recommended annual dental exams for proper oral hygiene  Community Resource Referral / Chronic Care Management:  CRR required this visit?  No   CCM required this visit?  No      Plan:     I have personally reviewed and noted the following in the patient's chart:   Medical and social history Use of alcohol, tobacco or illicit drugs  Current medications and supplements including opioid prescriptions. Patient is currently taking opioids Functional ability and status Nutritional status Physical activity Advanced directives List of other physicians Hospitalizations, surgeries, and ER visits in previous 12 months Vitals Screenings to include cognitive, depression, and falls Referrals and appointments  In addition, I have reviewed and discussed with patient certain preventive protocols, quality metrics, and best practice recommendations. A written personalized care plan for preventive services as well as general preventive health recommendations were provided to patient.     Tillie Rung,  LPN   [id number]   Nurse Notes: Patient due Hep-C Screening

## 2022-07-31 NOTE — Patient Instructions (Addendum)
Katrina [name] , Thank you for taking time to come for your Medicare Wellness Visit. I appreciate your ongoing commitment to your health goals. Please review the following plan we discussed and let me know if I can assist you in the future.   These are the goals we discussed:  Goals       Get healthy (pt-stated)      Quit smoking and Live longer.        This is a list of the screening recommended for you and due dates:  Health Maintenance  Topic Date Due   COVID-19 Vaccine (4 - 2023-24 season) 08/16/2022*   Zoster (Shingles) Vaccine (1 of 2) 10/30/2022*   Pneumonia Vaccine (1 of 2 - PCV) 07/31/2023*   Mammogram  07/31/2023*   DEXA scan (bone density measurement)  07/31/2023*   Colon Cancer Screening  07/31/2023*   Hepatitis C Screening: USPSTF Recommendation to screen - Ages 18-79 yo.  07/31/2023*   Flu Shot  11/06/2022   Screening for Lung Cancer  07/18/2023   Medicare Annual Wellness Visit  07/31/2023   DTaP/Tdap/Td vaccine (2 - Td or Tdap) 04/21/2024   HPV Vaccine  Aged Out  *Topic was postponed. The date shown is not the original due date.   Opioid Pain Medicine Management Opioids are powerful medicines that are used to treat moderate to severe pain. When used for short periods of time, they can help you to: Sleep better. Do better in physical or occupational therapy. Feel better in the first few days after an injury. Recover from surgery. Opioids should be taken with the supervision of a trained health care provider. They should be taken for the shortest period of time possible. This is because opioids can be addictive, and the longer you take opioids, the greater your risk of addiction. This addiction can also be called opioid use disorder. What are the risks? Using opioid pain medicines for longer than 3 days increases your risk of side effects. Side effects include: Constipation. Nausea and vomiting. Breathing difficulties (respiratory  depression). Drowsiness. Confusion. Opioid use disorder. Itching. Taking opioid pain medicine for a long period of time can affect your ability to do daily tasks. It also puts you at risk for: Motor vehicle crashes. Depression. Suicide. Heart attack. Overdose, which can be life-threatening. What is a pain treatment plan? A pain treatment plan is an agreement between you and your health care provider. Pain is unique to each person, and treatments vary depending on your condition. To manage your pain, you and your health care provider need to work together. To help you do this: Discuss the goals of your treatment, including how much pain you might expect to have and how you will manage the pain. Review the risks and benefits of taking opioid medicines. Remember that a good treatment plan uses more than one approach and minimizes the chance of side effects. Be honest about the amount of medicines you take and about any drug or alcohol use. Get pain medicine prescriptions from only one health care provider. Pain can be managed with many types of alternative treatments. Ask your health care provider to refer you to one or more specialists who can help you manage pain through: Physical or occupational therapy. Counseling (cognitive behavioral therapy). Good nutrition. Biofeedback. Massage. Meditation. Non-opioid medicine. Following a gentle exercise program. How to use opioid pain medicine Taking medicine Take your pain medicine exactly as told by your health care provider. Take it only when you need it. If your  pain gets less severe, you may take less than your prescribed dose if your health care provider approves. If you are not having pain, do nottake pain medicine unless your health care provider tells you to take it. If your pain is severe, do nottry to treat it yourself by taking more pills than instructed on your prescription. Contact your health care provider for help. Write down  the times when you take your pain medicine. It is easy to become confused while on pain medicine. Writing the time can help you avoid overdose. Take other over-the-counter or prescription medicines only as told by your health care provider. Keeping yourself and others safe  While you are taking opioid pain medicine: Do not drive, use machinery, or power tools. Do not sign legal documents. Do not drink alcohol. Do not take sleeping pills. Do not supervise children by yourself. Do not do activities that require climbing or being in high places. Do not go to a lake, river, ocean, spa, or swimming pool. Do not share your pain medicine with anyone. Keep pain medicine in a locked cabinet or in a secure area where pets and children cannot reach it. Stopping your use of opioids If you have been taking opioid medicine for more than a few weeks, you may need to slowly decrease (taper) how much you take until you stop completely. Tapering your use of opioids can decrease your risk of symptoms of withdrawal, such as: Pain and cramping in the abdomen. Nausea. Sweating. Sleepiness. Restlessness. Uncontrollable shaking (tremors). Cravings for the medicine. Do not attempt to taper your use of opioids on your own. Talk with your health care provider about how to do this. Your health care provider may prescribe a step-down schedule based on how much medicine you are taking and how long you have been taking it. Getting rid of leftover pills Do not save any leftover pills. Get rid of leftover pills safely by: Taking the medicine to a prescription take-back program. This is usually offered by the county or law enforcement. Bringing them to a pharmacy that has a drug disposal container. Flushing them down the toilet. Check the label or package insert of your medicine to see whether this is safe to do. Throwing them out in the trash. Check the label or package insert of your medicine to see whether this is  safe to do. If it is safe to throw it out, remove the medicine from the original container, put it into a sealable bag or container, and mix it with used coffee grounds, food scraps, dirt, or cat litter before putting it in the trash. Follow these instructions at home: Activity Do exercises as told by your health care provider. Avoid activities that make your pain worse. Return to your normal activities as told by your health care provider. Ask your health care provider what activities are safe for you. General instructions You may need to take these actions to prevent or treat constipation: Drink enough fluid to keep your urine pale yellow. Take over-the-counter or prescription medicines. Eat foods that are high in fiber, such as beans, whole grains, and fresh fruits and vegetables. Limit foods that are high in fat and processed sugars, such as fried or sweet foods. Keep all follow-up visits. This is important. Where to find support If you have been taking opioids for a long time, you may benefit from receiving support for quitting from a local support group or counselor. Ask your health care provider for a referral to these  resources in your area. Where to find more information Centers for Disease Control and Prevention (CDC): FootballExhibition.com.br [place] Food and Drug Administration (FDA): PumpkinSearch.com.ee Get help right away if: You may have taken too much of an opioid (overdosed). Common symptoms of an overdose: Your breathing is slower or more shallow than normal. You have a very slow heartbeat (pulse). You have slurred speech. You have nausea and vomiting. Your pupils become very small. You have other potential symptoms: You are very confused. You faint or feel like you will faint. You have cold, clammy skin. You have blue lips or fingernails. You have thoughts of harming yourself or harming others. These symptoms may represent a serious problem that is an emergency. Do not wait to see if the  symptoms will go away. Get medical help right away. Call your local emergency services (911 in the [place] Do not drive yourself to the hospital.  If you ever feel like you may hurt yourself or others, or have thoughts about taking your own life, get help right away. Go to your nearest emergency department or: Call your local emergency services (911 in the [place] Call the [facility] ((650)823-0939 in the [place] Call a suicide crisis helpline, such as the National Suicide Prevention Lifeline at 254-475-1872 or 988 in the [place] This is open 24 hours a day in the [place] Text the Crisis Text Line at 203-402-8757 (in the [place] Summary Opioid medicines can help you manage moderate to severe pain for a short period of time. A pain treatment plan is an agreement between you and your health care provider. Discuss the goals of your treatment, including how much pain you might expect to have and how you will manage the pain. If you think that you or someone else may have taken too much of an opioid, get medical help right away. This information is not intended to replace advice given to you by your health care provider. Make sure you discuss any questions you have with your health care provider. Document Revised: 10/17/2020 Document Reviewed: 07/04/2020 Elsevier Patient Education  2023 Elsevier Inc.  Advanced directives: Advance directive discussed with you today. Even though you declined this today, please call our office should you change your mind, and we can give you the proper paperwork for you to fill out.   Conditions/risks identified: None  Next appointment: Follow up in one year for your annual wellness visit     Preventive Care 65 Years and Older, Female Preventive care refers to lifestyle choices and visits with your health care provider that can promote health and wellness. What does preventive care include? A yearly physical exam. This is also called an annual well  check. Dental exams once or twice a year. Routine eye exams. Ask your health care provider how often you should have your eyes checked. Personal lifestyle choices, including: Daily care of your teeth and gums. Regular physical activity. Eating a healthy diet. Avoiding tobacco and drug use. Limiting alcohol use. Practicing safe sex. Taking low-dose aspirin every day. Taking vitamin and mineral supplements as recommended by your health care provider. What happens during an annual well check? The services and screenings done by your health care provider during your annual well check will depend on your age, overall health, lifestyle risk factors, and family history of disease. Counseling  Your health care provider may ask you questions about your: Alcohol use. Tobacco use. Drug use. Emotional well-being. Home and relationship well-being. Sexual activity. Eating habits. History of falls.  Memory and ability to understand (cognition). Work and work Astronomer. Reproductive health. Screening  You may have the following tests or measurements: Height, weight, and BMI. Blood pressure. Lipid and cholesterol levels. These may be checked every 5 years, or more frequently if you are over 67 years old. Skin check. Lung cancer screening. You may have this screening every year starting at age 55 if you have a 30-pack-year history of smoking and currently smoke or have quit within the past 15 years. Fecal occult blood test (FOBT) of the stool. You may have this test every year starting at age 83. Flexible sigmoidoscopy or colonoscopy. You may have a sigmoidoscopy every 5 years or a colonoscopy every 10 years starting at age 28. Hepatitis C blood test. Hepatitis B blood test. Sexually transmitted disease (STD) testing. Diabetes screening. This is done by checking your blood sugar (glucose) after you have not eaten for a while (fasting). You may have this done every 1-3 years. Bone density scan.  This is done to screen for osteoporosis. You may have this done starting at age 64. Mammogram. This may be done every 1-2 years. Talk to your health care provider about how often you should have regular mammograms. Talk with your health care provider about your test results, treatment options, and if necessary, the need for more tests. Vaccines  Your health care provider may recommend certain vaccines, such as: Influenza vaccine. This is recommended every year. Tetanus, diphtheria, and acellular pertussis (Tdap, Td) vaccine. You may need a Td booster every 10 years. Zoster vaccine. You may need this after age 54. Pneumococcal 13-valent conjugate (PCV13) vaccine. One dose is recommended after age 29. Pneumococcal polysaccharide (PPSV23) vaccine. One dose is recommended after age 70. Talk to your health care provider about which screenings and vaccines you need and how often you need them. This information is not intended to replace advice given to you by your health care provider. Make sure you discuss any questions you have with your health care provider. Document Released: 04/20/2015 Document Revised: 12/12/2015 Document Reviewed: 01/23/2015 Elsevier Interactive Patient Education  2017 [employer].  Fall Prevention in the Home Falls can cause injuries. They can happen to people of all ages. There are many things you can do to make your home safe and to help prevent falls. What can I do on the outside of my home? Regularly fix the edges of walkways and driveways and fix any cracks. Remove anything that might make you trip as you walk through a door, such as a raised step or threshold. Trim any bushes or trees on the path to your home. Use bright outdoor lighting. Clear any walking paths of anything that might make someone trip, such as rocks or tools. Regularly check to see if handrails are loose or broken. Make sure that both sides of any steps have handrails. Any raised decks and porches  should have guardrails on the edges. Have any leaves, snow, or ice cleared regularly. Use sand or salt on walking paths during winter. Clean up any spills in your garage right away. This includes oil or grease spills. What can I do in the bathroom? Use night lights. Install grab bars by the toilet and in the tub and shower. Do not use towel bars as grab bars. Use non-skid mats or decals in the tub or shower. If you need to sit down in the shower, use a plastic, non-slip stool. Keep the floor dry. Clean up any water that spills on the floor  as soon as it happens. Remove soap buildup in the tub or shower regularly. Attach bath mats securely with double-sided non-slip rug tape. Do not have throw rugs and other things on the floor that can make you trip. What can I do in the bedroom? Use night lights. Make sure that you have a light by your bed that is easy to reach. Do not use any sheets or blankets that are too big for your bed. They should not hang down onto the floor. Have a firm chair that has side arms. You can use this for support while you get dressed. Do not have throw rugs and other things on the floor that can make you trip. What can I do in the kitchen? Clean up any spills right away. Avoid walking on wet floors. Keep items that you use a lot in easy-to-reach places. If you need to reach something above you, use a strong step stool that has a grab bar. Keep electrical cords out of the way. Do not use floor polish or wax that makes floors slippery. If you must use wax, use non-skid floor wax. Do not have throw rugs and other things on the floor that can make you trip. What can I do with my stairs? Do not leave any items on the stairs. Make sure that there are handrails on both sides of the stairs and use them. Fix handrails that are broken or loose. Make sure that handrails are as long as the stairways. Check any carpeting to make sure that it is firmly attached to the stairs.  Fix any carpet that is loose or worn. Avoid having throw rugs at the top or bottom of the stairs. If you do have throw rugs, attach them to the floor with carpet tape. Make sure that you have a light switch at the top of the stairs and the bottom of the stairs. If you do not have them, ask someone to add them for you. What else can I do to help prevent falls? Wear shoes that: Do not have high heels. Have rubber bottoms. Are comfortable and fit you well. Are closed at the toe. Do not wear sandals. If you use a stepladder: Make sure that it is fully opened. Do not climb a closed stepladder. Make sure that both sides of the stepladder are locked into place. Ask someone to hold it for you, if possible. Clearly mark and make sure that you can see: Any grab bars or handrails. First and last steps. Where the edge of each step is. Use tools that help you move around (mobility aids) if they are needed. These include: Canes. Walkers. Scooters. Crutches. Turn on the lights when you go into a dark area. Replace any light bulbs as soon as they burn out. Set up your furniture so you have a clear path. Avoid moving your furniture around. If any of your floors are uneven, fix them. If there are any pets around you, be aware of where they are. Review your medicines with your doctor. Some medicines can make you feel dizzy. This can increase your chance of falling. Ask your doctor what other things that you can do to help prevent falls. This information is not intended to replace advice given to you by your health care provider. Make sure you discuss any questions you have with your health care provider. Document Released: 01/18/2009 Document Revised: 08/30/2015 Document Reviewed: 04/28/2014 Elsevier Interactive Patient Education  2017 [employer].

## 2022-08-07 DIAGNOSIS — I10 Essential (primary) hypertension: Secondary | ICD-10-CM | POA: Insufficient documentation

## 2022-08-07 NOTE — Assessment & Plan Note (Signed)
Blood pressure currently stable.  Continue to hold cozaar per hospital recommendation.  Monitor closely

## 2022-08-07 NOTE — Assessment & Plan Note (Signed)
Recent hospitalization for COPD exacerbation.  Improved Continue inhalers and respiratory medication as prescribed  New pulmonology provider

## 2022-08-07 NOTE — Assessment & Plan Note (Signed)
The patient is a current and everyday smoker.

## 2022-08-07 NOTE — Assessment & Plan Note (Signed)
Patient with chronic heart failure. Continues to see cardiology

## 2022-08-12 ENCOUNTER — Ambulatory Visit: Payer: Medicare Other

## 2022-08-12 ENCOUNTER — Ambulatory Visit (HOSPITAL_COMMUNITY): Payer: Medicare Other | Attending: Cardiology

## 2022-08-12 DIAGNOSIS — I428 Other cardiomyopathies: Secondary | ICD-10-CM | POA: Diagnosis not present

## 2022-08-12 DIAGNOSIS — E782 Mixed hyperlipidemia: Secondary | ICD-10-CM

## 2022-08-12 LAB — HEPATIC FUNCTION PANEL
ALT: 21 IU/L (ref 0–32)
AST: 19 IU/L (ref 0–40)
Albumin: 4.2 g/dL (ref 3.9–4.9)
Alkaline Phosphatase: 128 IU/L — ABNORMAL HIGH (ref 44–121)
Bilirubin Total: 0.5 mg/dL (ref 0.0–1.2)
Bilirubin, Direct: 0.14 mg/dL (ref 0.00–0.40)
Total Protein: 6.5 g/dL (ref 6.0–8.5)

## 2022-08-12 LAB — LIPID PANEL
Chol/HDL Ratio: 3 ratio (ref 0.0–4.4)
Cholesterol, Total: 151 mg/dL (ref 100–199)
HDL: 51 mg/dL (ref >39)
LDL Chol Calc (NIH): 76 mg/dL (ref 0–99)
Triglycerides: 140 mg/dL (ref 0–149)
VLDL Cholesterol Cal: 24 mg/dL (ref 5–40)

## 2022-08-12 LAB — ECHOCARDIOGRAM COMPLETE
Area-P 1/2: 2.84 cm2
S' Lateral: 4.5 cm

## 2022-08-14 ENCOUNTER — Telehealth: Payer: Self-pay | Admitting: Pharmacist

## 2022-08-14 ENCOUNTER — Telehealth: Payer: Self-pay | Admitting: *Deleted

## 2022-08-14 ENCOUNTER — Other Ambulatory Visit: Payer: Medicare Other

## 2022-08-14 DIAGNOSIS — I428 Other cardiomyopathies: Secondary | ICD-10-CM

## 2022-08-14 MED ORDER — EZETIMIBE 10 MG PO TABS: 10.0000 mg | ORAL_TABLET | Freq: Every day | ORAL | 3 refills | Status: DC

## 2022-08-14 NOTE — Telephone Encounter (Signed)
Spoke to patient about her lipids.  LDL-C slightly above goal of less than 70 however her non-HDL cholesterol is at goal at 100.  Patient is taking ezetimibe somehow got removed from her medication list.  But I did confirm that she is taking both atorvastatin 80 and ezetimibe 10 mg daily. Since cost is a concern to patient and her non-HDL cholesterol is at goal, will continue current therapy. Patient advised that she had her last cigarette on Monday.  She is vaping and this is helping with cravings.  She knows that she cannot vape long-term, but would like to use this as a short-term solution.  She does have access to nicotine replacement therapy with her OTC card if she decides to use patches or gum.  Patient asks about her echo results.  Advised that her EF was 35 to 40% which is the same as it was in October.  Advised that medication therapy was being discussed between RN and MD as MD had recommended losartan but patient previously on this and was stopped at hospitalization due to hyperkalemia.  She will await further guidance.  Looks like potassium was 5.3 when losartan was discontinued.  She is also on spironolactone.  My recommendation would be to decrease spironolactone and resume losartan with repeat BMP in about a week.

## 2022-08-14 NOTE — Telephone Encounter (Signed)
I spoke w the patient.  She will come in tomorrow morning for bmet.  She will need a new prescription of losartan if she is to restart it.

## 2022-08-14 NOTE — Telephone Encounter (Signed)
-----   Message from Orbie Pyo, MD sent at 08/13/2022  4:17 PM EDT ----- Lets check a BMP and if Cr and K ok, let's restart and get another BMP a week later.  Thanks. ----- Message ----- From: Lendon Ka, RN Sent: 08/13/2022   3:48 PM EDT To: Orbie Pyo, MD  Losartan 50 mg daily was held at hospital admission due to hyperkalemia per dc note 06/03/22.

## 2022-08-15 ENCOUNTER — Ambulatory Visit: Payer: Medicare Other | Attending: Internal Medicine

## 2022-08-15 DIAGNOSIS — I428 Other cardiomyopathies: Secondary | ICD-10-CM

## 2022-08-15 LAB — BASIC METABOLIC PANEL
BUN/Creatinine Ratio: 19 (ref 12–28)
BUN: 20 mg/dL (ref 8–27)
CO2: 23 mmol/L (ref 20–29)
Calcium: 9.9 mg/dL (ref 8.7–10.3)
Chloride: 102 mmol/L (ref 96–106)
Creatinine, Ser: 1.06 mg/dL — ABNORMAL HIGH (ref 0.57–1.00)
Glucose: 102 mg/dL — ABNORMAL HIGH (ref 70–99)
Potassium: 4.5 mmol/L (ref 3.5–5.2)
Sodium: 140 mmol/L (ref 134–144)
eGFR: 58 mL/min/{1.73_m2} — ABNORMAL LOW (ref >59)

## 2022-08-18 ENCOUNTER — Other Ambulatory Visit: Payer: Medicare Other

## 2022-08-20 ENCOUNTER — Other Ambulatory Visit: Payer: Self-pay | Admitting: Internal Medicine

## 2022-08-23 ENCOUNTER — Other Ambulatory Visit: Payer: Self-pay | Admitting: Internal Medicine

## 2022-08-25 ENCOUNTER — Encounter: Payer: Self-pay | Admitting: Physical Medicine & Rehabilitation

## 2022-08-25 ENCOUNTER — Encounter: Payer: Medicare Other | Attending: Physical Medicine & Rehabilitation | Admitting: Physical Medicine & Rehabilitation

## 2022-08-25 VITALS — BP 105/69 | HR 77 | Ht 67.0 in | Wt 238.4 lb

## 2022-08-25 DIAGNOSIS — G8929 Other chronic pain: Secondary | ICD-10-CM | POA: Diagnosis not present

## 2022-08-25 DIAGNOSIS — M545 Low back pain, unspecified: Secondary | ICD-10-CM

## 2022-08-25 DIAGNOSIS — G894 Chronic pain syndrome: Secondary | ICD-10-CM

## 2022-08-25 DIAGNOSIS — Z79891 Long term (current) use of opiate analgesic: Secondary | ICD-10-CM | POA: Insufficient documentation

## 2022-08-25 DIAGNOSIS — F39 Unspecified mood [affective] disorder: Secondary | ICD-10-CM | POA: Diagnosis not present

## 2022-08-25 DIAGNOSIS — R252 Cramp and spasm: Secondary | ICD-10-CM | POA: Insufficient documentation
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
## 2022-08-26 ENCOUNTER — Telehealth: Payer: Self-pay

## 2022-08-26 DIAGNOSIS — I10 Essential (primary) hypertension: Secondary | ICD-10-CM

## 2022-08-26 DIAGNOSIS — Z79899 Other long term (current) drug therapy: Secondary | ICD-10-CM

## 2022-08-26 NOTE — Telephone Encounter (Signed)
Patient notified of lab results and recommendations by Dr Lynnette Caffey to resume Losartan 25 mg daily. She will come to the office on 5/53 for a repeat BMP.

## 2022-08-26 NOTE — Telephone Encounter (Signed)
-----   Message from Orbie Pyo, MD sent at 08/18/2022  7:43 AM EDT ----- Lets start losartan 25 mg at bedtime and check a BMP next week.

## 2022-08-28 ENCOUNTER — Other Ambulatory Visit: Payer: Self-pay

## 2022-08-28 ENCOUNTER — Encounter (HOSPITAL_COMMUNITY): Payer: Self-pay

## 2022-08-28 ENCOUNTER — Ambulatory Visit: Payer: Medicare Other

## 2022-08-28 DIAGNOSIS — Z79899 Other long term (current) drug therapy: Secondary | ICD-10-CM

## 2022-08-28 DIAGNOSIS — I10 Essential (primary) hypertension: Secondary | ICD-10-CM
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
## 2022-08-28 NOTE — Progress Notes (Signed)
Anesthesia Chart Review: Same-day workup  Follows with cardiology at [facility] for history of nonischemic cardiomyopathy, left bundle branch block, HFrEF.  Most recent echo 08/12/2022 showed EF 35 to 40% with global hypokinesis, normal RV function, no significant valvular abnormalities.  Significant change from prior echo 07/16/2021.  Cath January 2023 showed normal coronary arteries.  History of tobacco use and centrilobular emphysema on CT.  Follows with pulmonologist Dr. Tonia Brooms.  Per last note 07/21/2022, "This is a 67 year old female seen abuse, centrilobular, paraseptal emphysema on CT imaging, lung function test reassuring with no severe obstruction. Had prior CT imaging with nodule that had slightly decreased in size. She reenrolled in lung cancer screening at her last scan last week. This shows stability of the lesion. Final read has not been complete. "  She was recommended to continue Breztri inhaler and follow-up in 1 year.  BMP 08/15/2022 reviewed, unremarkable.  History of EtOH abuse.  Patient will need day of surgery evaluation.  EKG 05/31/2022: Sinus rhythm.  Rate 99.  Ventricular trigeminy.  Left bundle branch block.  PFTs 07/01/2021: FVC-%Pred-Pre % 102  FEV1-%Pred-Pre % 98  FEV1FVC-%Pred-Pre % 96  TLC % pred % 111  RV % pred % 118  DLCO unc % pred % 100    TTE 08/12/2022:  1. Left ventricular ejection fraction, by estimation, is 35 to 40%. The  left ventricle has moderately decreased function. The left ventricle  demonstrates global hypokinesis. The left ventricular internal cavity size  was mildly dilated. There is mild  concentric left ventricular hypertrophy. Left ventricular diastolic  parameters are indeterminate.   2. Right ventricular systolic function is normal. The right ventricular  size is normal.   3. Left atrial size was moderately dilated.   4. The mitral valve is normal in structure. Trivial mitral valve  regurgitation. No evidence of mitral stenosis.   5. The  aortic valve was not well visualized. Aortic valve regurgitation  is not visualized. No aortic stenosis is present.   6. The inferior vena cava is normal in size with greater than 50%  respiratory variability, suggesting right atrial pressure of 3 mmHg.   Comparison(s): No significant change from prior study.   Right/left heart cath 04/26/2021:   LV end diastolic pressure is normal.   1.  Normal right dominant coronary circulation. 2.  Cardiac output of 8.3 L/min and cardiac index of 3.97 L/min/m with mean RA pressure 4 mmHg and mean wedge pressure of 18 mmHg.   Recommendations continue goal-directed medical therapy for nonischemic cardiomyopathy as well as aspirin and atorvastatin for aortic atherosclerosis.    Zannie Cove [facility] Stay [facility]/Anesthesiology Phone [phone] 08/28/2022 9:55 AM

## 2022-08-28 NOTE — Progress Notes (Signed)
SDW call  Patient was given pre-op instructions over the phone. Patient verbalized understanding of instructions provided.     PCP - Dr. Vincent Gros Cardiologist - Dr. Alverda Skeans Pulmonary: Icard   PPM/ICD - Denies  Chest x-ray - 05/31/2022 EKG -  05/31/2022 Stress Test - ECHO - 08/12/2022 Cardiac Cath - 04/26/2021  Sleep Study/sleep apnea/CPAP: Denies.  Patient states she is not diabetic.  States she is taking the Jardiance for her heart. Instructed her to stop taking 72 hours prior to procedure.  Last dose 08/29/2022  Blood Thinner Instructions: Denies Aspirin Instructions:States was told to continue her ASA   ERAS Protcol - No NOP PRE-SURGERY Ensure or G2-    COVID TEST- n/a    Anesthesia review: LBBB, COPD, [place], ETOH abuse, heart failure. Patient is on a pain management contract   Patient denies shortness of breath, fever, cough and chest pain over the phone call  Your procedure is scheduled on Tuesday, Sep 02, 2022  Report to [facility] Main Entrance "A" at  0530  A.M., then check in with the Admitting office.  Call this number if you have problems the morning of surgery:  [phone]   If you have any questions prior to your surgery date call [phone]: Open Monday-Friday 8am-4pm If you experience any cold or flu symptoms such as cough, fever, chills, shortness of breath, etc. between now and your scheduled surgery, please notify us at the above number    Remember:  Do not eat or drink after midnight the night before your surgery Take these medicines the morning of surgery with A SIP OF WATER:  Atorvastatin, breztri aerosphere, zetia, flonase, metoprolol, ASA  As needed: Tylenol, albuterol, zofran  As of today, STOP taking any Aleve, Naproxen, Ibuprofen, Motrin, Advil, Goody's, BC's, all herbal medications, fish oil, and all vitamins.

## 2022-08-29 ENCOUNTER — Ambulatory Visit: Payer: Medicare Other | Attending: Internal Medicine

## 2022-08-29 ENCOUNTER — Other Ambulatory Visit: Payer: Self-pay | Admitting: Internal Medicine

## 2022-08-29 DIAGNOSIS — Z79899 Other long term (current) drug therapy: Secondary | ICD-10-CM | POA: Diagnosis not present

## 2022-08-29 DIAGNOSIS — I10 Essential (primary) hypertension: Secondary | ICD-10-CM | POA: Diagnosis not present

## 2022-08-30 LAB — BASIC METABOLIC PANEL
BUN/Creatinine Ratio: 16 (ref 12–28)
BUN: 16 mg/dL (ref 8–27)
CO2: 19 mmol/L — ABNORMAL LOW (ref 20–29)
Calcium: 9.4 mg/dL (ref 8.7–10.3)
Chloride: 108 mmol/L — ABNORMAL HIGH (ref 96–106)
Creatinine, Ser: 1.02 mg/dL — ABNORMAL HIGH (ref 0.57–1.00)
Glucose: 100 mg/dL — ABNORMAL HIGH (ref 70–99)
Potassium: 4.4 mmol/L (ref 3.5–5.2)
Sodium: 142 mmol/L (ref 134–144)
eGFR: 61 mL/min/{1.73_m2} (ref >59)

## 2022-09-02 ENCOUNTER — Encounter (HOSPITAL_COMMUNITY): Payer: Self-pay

## 2022-09-02 ENCOUNTER — Ambulatory Visit (HOSPITAL_COMMUNITY)
Admission: RE | Admit: 2022-09-02 | Discharge: 2022-09-02 | Disposition: A | Discharge status: Home / Self Care | Payer: Medicare Other | Source: Ambulatory Visit | Attending: Physical Medicine & Rehabilitation | Admitting: Physical Medicine & Rehabilitation

## 2022-09-02 ENCOUNTER — Ambulatory Visit (HOSPITAL_COMMUNITY): Payer: Medicare Other | Admitting: Physician Assistant

## 2022-09-02 ENCOUNTER — Ambulatory Visit (HOSPITAL_COMMUNITY)
Admission: RE | Admit: 2022-09-02 | Discharge: 2022-09-02 | Disposition: A | Discharge status: Home / Self Care | Payer: Medicare Other | Attending: Physical Medicine & Rehabilitation | Admitting: Physical Medicine & Rehabilitation

## 2022-09-02 ENCOUNTER — Encounter (HOSPITAL_COMMUNITY): Admission: RE | Disposition: A | Discharge status: Home / Self Care | Payer: Self-pay | Source: Home / Self Care

## 2022-09-02 ENCOUNTER — Other Ambulatory Visit: Payer: Self-pay

## 2022-09-02 ENCOUNTER — Ambulatory Visit (HOSPITAL_BASED_OUTPATIENT_CLINIC_OR_DEPARTMENT_OTHER): Payer: Medicare Other | Admitting: Physician Assistant

## 2022-09-02 DIAGNOSIS — I700 Atherosclerosis of aorta: Secondary | ICD-10-CM | POA: Diagnosis not present

## 2022-09-02 DIAGNOSIS — M545 Low back pain, unspecified: Secondary | ICD-10-CM | POA: Diagnosis not present

## 2022-09-02 DIAGNOSIS — I509 Heart failure, unspecified: Secondary | ICD-10-CM

## 2022-09-02 DIAGNOSIS — I110 Hypertensive heart disease with heart failure: Secondary | ICD-10-CM

## 2022-09-02 DIAGNOSIS — M48061 Spinal stenosis, lumbar region without neurogenic claudication: Secondary | ICD-10-CM | POA: Diagnosis not present

## 2022-09-02 DIAGNOSIS — K219 Gastro-esophageal reflux disease without esophagitis: Secondary | ICD-10-CM | POA: Insufficient documentation

## 2022-09-02 DIAGNOSIS — I447 Left bundle-branch block, unspecified: Secondary | ICD-10-CM | POA: Insufficient documentation

## 2022-09-02 DIAGNOSIS — F32A Depression, unspecified: Secondary | ICD-10-CM | POA: Diagnosis not present

## 2022-09-02 DIAGNOSIS — G8929 Other chronic pain: Secondary | ICD-10-CM | POA: Diagnosis not present

## 2022-09-02 DIAGNOSIS — F17200 Nicotine dependence, unspecified, uncomplicated: Secondary | ICD-10-CM | POA: Diagnosis not present

## 2022-09-02 DIAGNOSIS — Z6836 Body mass index (BMI) 36.0-36.9, adult: Secondary | ICD-10-CM | POA: Diagnosis not present

## 2022-09-02 DIAGNOSIS — J432 Centrilobular emphysema: Secondary | ICD-10-CM | POA: Diagnosis not present

## 2022-09-02 DIAGNOSIS — F419 Anxiety disorder, unspecified: Secondary | ICD-10-CM | POA: Insufficient documentation

## 2022-09-02 DIAGNOSIS — J449 Chronic obstructive pulmonary disease, unspecified: Secondary | ICD-10-CM

## 2022-09-02 DIAGNOSIS — E669 Obesity, unspecified: Secondary | ICD-10-CM | POA: Diagnosis not present

## 2022-09-02 DIAGNOSIS — M5126 Other intervertebral disc displacement, lumbar region: Secondary | ICD-10-CM | POA: Diagnosis not present

## 2022-09-02 DIAGNOSIS — I428 Other cardiomyopathies: Secondary | ICD-10-CM | POA: Diagnosis not present

## 2022-09-02 HISTORY — PX: RADIOLOGY WITH ANESTHESIA: SHX6223

## 2022-09-02 LAB — CBC
HCT: 41.1 % (ref 36.0–46.0)
Hemoglobin: 13.3 g/dL (ref 12.0–15.0)
MCH: 32.4 pg (ref 26.0–34.0)
MCHC: 32.4 g/dL (ref 30.0–36.0)
MCV: 100 fL (ref 80.0–100.0)
Platelets: 218 10*3/uL (ref 150–400)
RBC: 4.11 MIL/uL (ref 3.87–5.11)
RDW: 13.9 % (ref 11.5–15.5)
WBC: 9.9 10*3/uL (ref 4.0–10.5)
nRBC: 0 % (ref 0.0–0.2)

## 2022-09-02 SURGERY — MRI WITH ANESTHESIA
Anesthesia: General

## 2022-09-02 MED ORDER — ONDANSETRON HCL 4 MG/2ML IJ SOLN
4.0000 mg | Freq: Once | INTRAMUSCULAR | Status: AC | PRN
  Administered 2022-09-02: 4 mg via INTRAVENOUS

## 2022-09-02 MED ORDER — CHLORHEXIDINE GLUCONATE 0.12 % MT SOLN
15.0000 mL | Freq: Once | OROMUCOSAL | Status: AC
  Administered 2022-09-02: 15 mL via OROMUCOSAL
  Filled 2022-09-02: qty 15

## 2022-09-02 MED ORDER — LIDOCAINE 2% (20 MG/ML) 5 ML SYRINGE
INTRAMUSCULAR | Status: DC | PRN
  Administered 2022-09-02: 100 mg via INTRAVENOUS

## 2022-09-02 MED ORDER — ORAL CARE MOUTH RINSE: 15.0000 mL | Freq: Once | OROMUCOSAL | Status: AC

## 2022-09-02 MED ORDER — DEXAMETHASONE SODIUM PHOSPHATE 10 MG/ML IJ SOLN
INTRAMUSCULAR | Status: DC | PRN
  Administered 2022-09-02: 10 mg via INTRAVENOUS

## 2022-09-02 MED ORDER — LACTATED RINGERS IV SOLN: INTRAVENOUS | Status: DC

## 2022-09-02 MED ORDER — FENTANYL CITRATE (PF) 100 MCG/2ML IJ SOLN: 25.0000 ug | INTRAMUSCULAR | Status: DC | PRN

## 2022-09-02 MED ORDER — PROPOFOL 10 MG/ML IV BOLUS
INTRAVENOUS | Status: DC | PRN
  Administered 2022-09-02: 170 mg via INTRAVENOUS

## 2022-09-02 MED ORDER — ONDANSETRON HCL 4 MG/2ML IJ SOLN
INTRAMUSCULAR | Status: DC | PRN
  Administered 2022-09-02: 4 mg via INTRAVENOUS

## 2022-09-02 MED ORDER — PHENYLEPHRINE 80 MCG/ML (10ML) SYRINGE FOR IV PUSH (FOR BLOOD PRESSURE SUPPORT)
PREFILLED_SYRINGE | INTRAVENOUS | Status: DC | PRN
  Administered 2022-09-02: 160 ug via INTRAVENOUS
  Administered 2022-09-02: 120 ug via INTRAVENOUS
  Administered 2022-09-02: 80 ug via INTRAVENOUS

## 2022-09-02 MED ORDER — ONDANSETRON HCL 4 MG/2ML IJ SOLN
INTRAMUSCULAR | Status: AC
  Filled 2022-09-02: qty 2

## 2022-09-02 MED ORDER — ACETAMINOPHEN 500 MG PO TABS
1000.0000 mg | ORAL_TABLET | Freq: Once | ORAL | Status: AC
  Administered 2022-09-02: 1000 mg via ORAL
  Filled 2022-09-02: qty 2

## 2022-09-02 NOTE — H&P (Signed)
Anesthesia H&P Update: History and Physical Exam reviewed; patient is OK for planned anesthetic and procedure. ? ?

## 2022-09-02 NOTE — Transfer of Care (Signed)
Immediate Anesthesia Transfer of Care Note  Patient: Katrina [name]  Procedure(s) Performed: MRI WITH ANESTHESIA OF LUMBAR SPINE WITHOUT CONTRAST  Patient Location: PACU  Anesthesia Type:General  Level of Consciousness: awake, alert , patient cooperative, and responds to stimulation  Airway & Oxygen Therapy: Patient Spontanous Breathing  Post-op Assessment: Report given to RN, Post -op Vital signs reviewed and stable, and Patient moving all extremities X 4  Post vital signs: Reviewed and stable  Last Vitals:  Vitals Value Taken Time  BP 128/51   Temp    Pulse 68 09/02/22 0849  Resp 16   SpO2 95 % 09/02/22 0849  Vitals shown include unvalidated device data.  Last Pain:  Vitals:   09/02/22 0655  TempSrc:   PainSc: 0-No pain      Patients Stated Pain Goal: 0 (09/02/22 0655)  Complications: No notable events documented.

## 2022-09-02 NOTE — Anesthesia Procedure Notes (Signed)
Procedure Name: Intubation Date/Time: 09/02/2022 8:10 AM  Performed by: Lonia Mad, CRNAPre-anesthesia Checklist: Patient identified, Emergency Drugs available, Suction available and Patient being monitored Patient Re-evaluated:Patient Re-evaluated prior to induction Oxygen Delivery Method: Circle System Utilized Preoxygenation: Pre-oxygenation with 100% oxygen Induction Type: IV induction Ventilation: Mask ventilation without difficulty and Oral airway inserted - appropriate to patient size LMA: LMA inserted LMA Size: 4.0 Tube type: Oral Number of attempts: 1 Airway Equipment and Method: Stylet and Oral airway Placement Confirmation: ETT inserted through vocal cords under direct vision, positive ETCO2 and breath sounds checked- equal and bilateral Tube secured with: Tape Dental Injury: Teeth and Oropharynx as per pre-operative assessment

## 2022-09-02 NOTE — Anesthesia Postprocedure Evaluation (Signed)
Anesthesia Post Note  Patient: BRECKYN WENSTROM  Procedure(s) Performed: MRI WITH ANESTHESIA OF LUMBAR SPINE WITHOUT CONTRAST     Patient location during evaluation: Cath Lab Anesthesia Type: General Level of consciousness: awake and alert Pain management: pain level controlled Vital Signs Assessment: post-procedure vital signs reviewed and stable Respiratory status: spontaneous breathing, nonlabored ventilation, respiratory function stable and patient connected to nasal cannula oxygen Cardiovascular status: blood pressure returned to baseline and stable Postop Assessment: no apparent nausea or vomiting Anesthetic complications: no   No notable events documented.  Last Vitals:  Vitals:   09/02/22 0905 09/02/22 0920  BP: (!) 125/55 121/64  Pulse: 69 63  Resp: 14 19  Temp:  36.5 C  SpO2: 94% 95%    Last Pain:  Vitals:   09/02/22 0920  TempSrc:   PainSc: 0-No pain                 Collene Schlichter

## 2022-09-03 ENCOUNTER — Encounter (HOSPITAL_COMMUNITY): Payer: Self-pay | Admitting: Radiology

## 2022-09-10 IMAGING — XA DG MYELOGRAPHY LUMBAR INJ CERVICAL
8 series · 8 of 8 positions shown · non-contrast
Comparison: Cervical spine MRI 10/04/2019

CLINICAL DATA: Cervical radiculopathy. Neck pain radiating into the
right upper extremity with involvement of the right first through
third digits.
TECHNIQUE: Contiguous axial images were obtained through the cervical spine
after the intrathecal infusion of contrast. Coronal and sagittal
reconstructions were obtained of the axial image sets.

[Series 1: ortho adipose · 1 of 1 slices shown (1 of 8)]
[im 1/1]
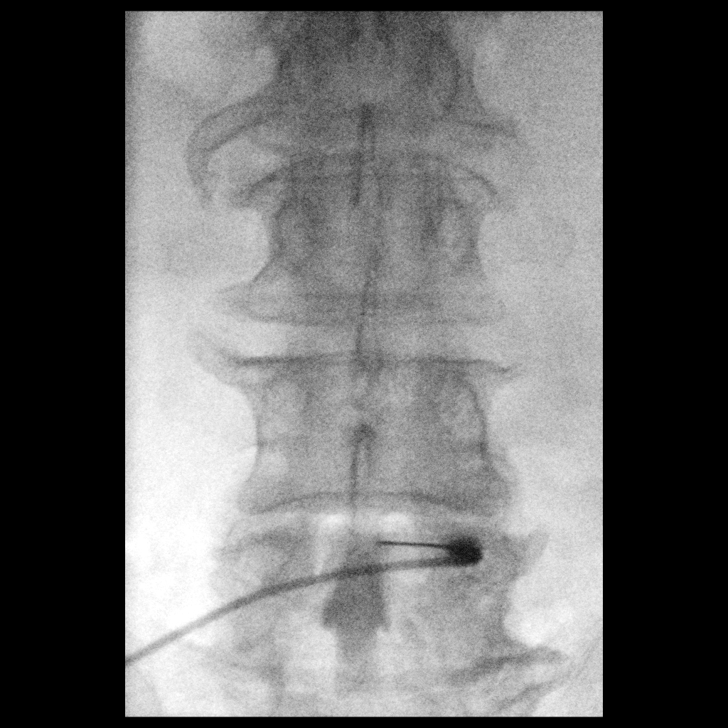

[Series 2: ortho adipose · 1 of 1 slices shown (2 of 8)]
[im 1/1]
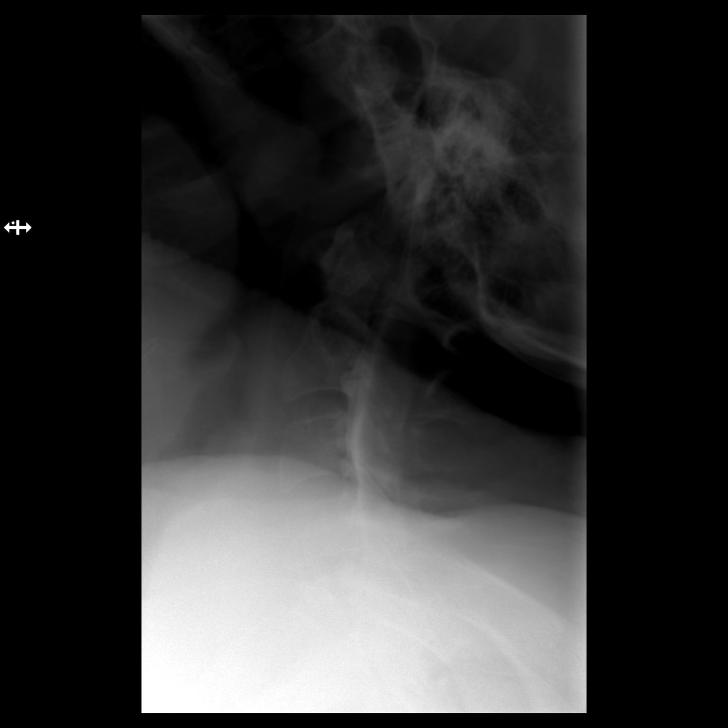

[Series 3: ortho adipose · 1 of 1 slices shown (3 of 8)]
[im 1/1]
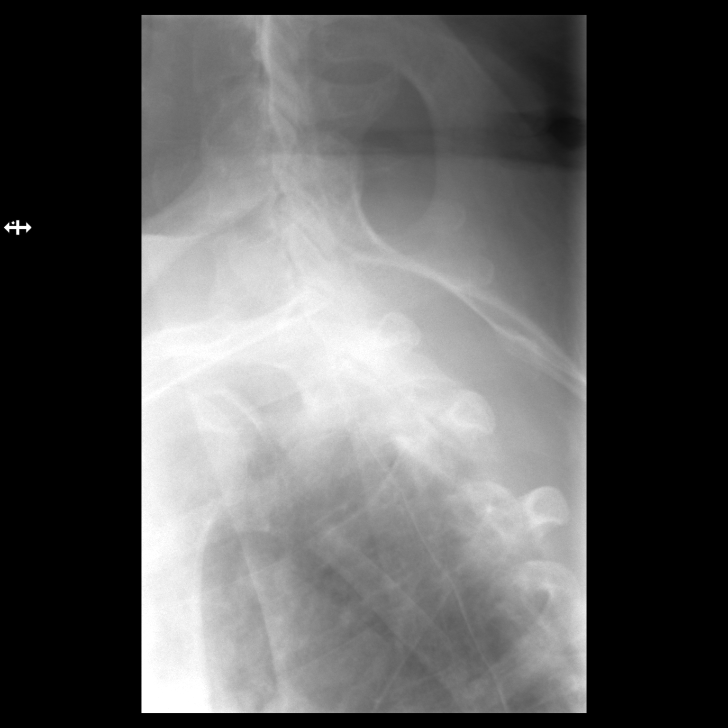

[Series 4: ortho adipose · 1 of 1 slices shown (4 of 8)]
[im 1/1]
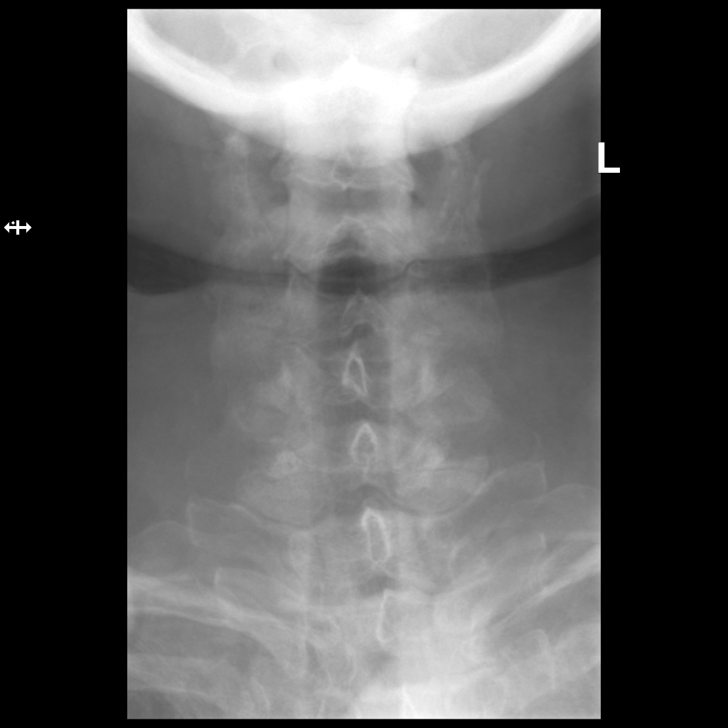

[Series 5: ortho adipose · 1 of 1 slices shown (5 of 8)]
[im 1/1]
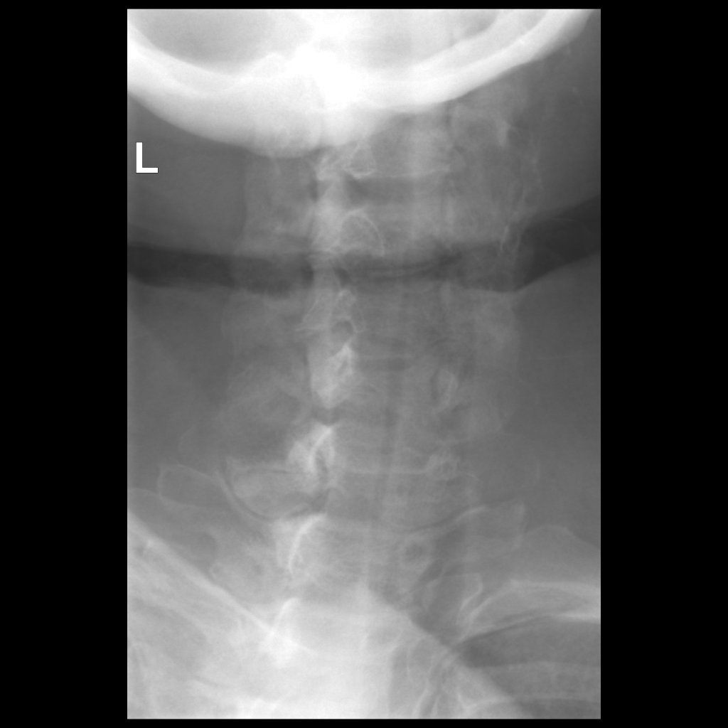

[Series 6: ortho adipose · 1 of 1 slices shown (6 of 8)]
[im 1/1]
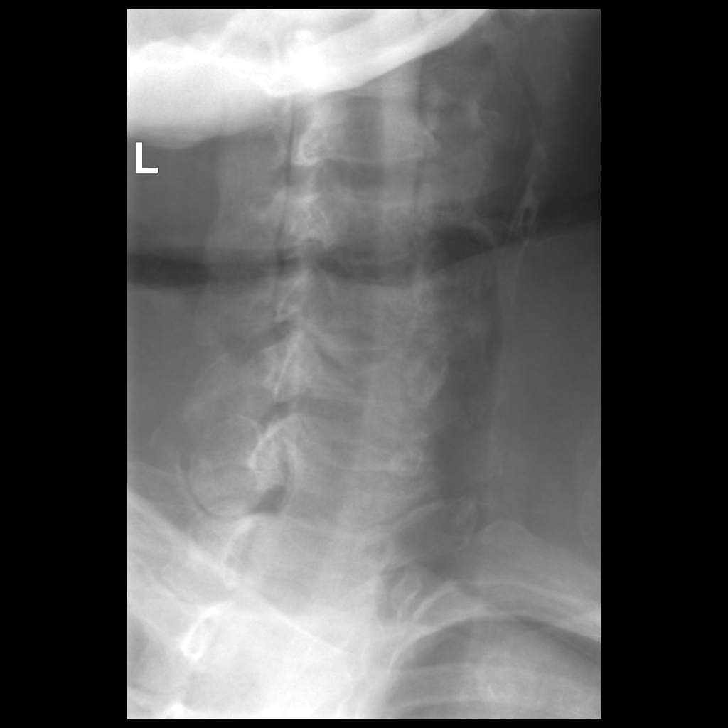

[Series 7: ortho adipose · 1 of 1 slices shown (7 of 8)]
[im 1/1]
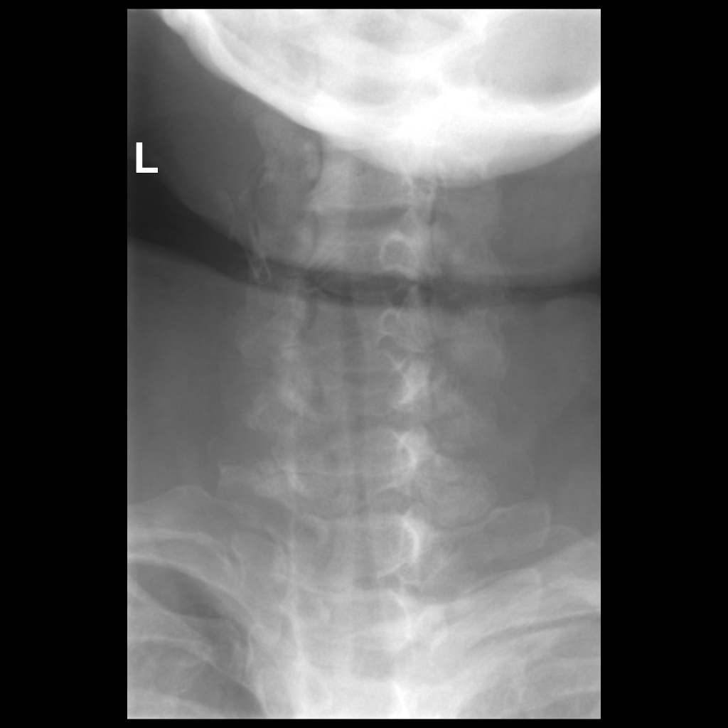

[Series 8: ortho adipose · 1 of 1 slices shown (8 of 8)]
[im 1/1]
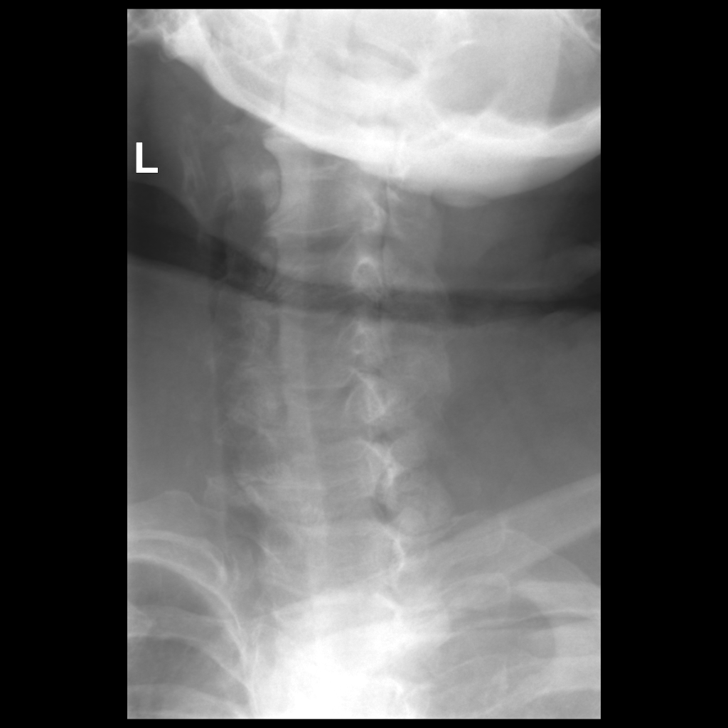

[8 of 8 positions shown; findings below may reference images not displayed]

EXAM:
CERVICAL MYELOGRAM

CT CERVICAL MYELOGRAM

FLUOROSCOPY TIME:  Fluoroscopy Time: 43 seconds

Radiation Exposure Index: 138.60 microGray*m^2

PROCEDURE:
LUMBAR PUNCTURE FOR CERVICAL MYELOGRAM

After thorough discussion of risks and benefits of the procedure
including bleeding, infection, injury to nerves, blood vessels,
adjacent structures as well as headache and CSF leak, written and
oral informed consent was obtained. Consent was obtained by Dr.
Nya Jumper. We discussed the high likelihood of obtaining a
diagnostic study.

Patient was positioned prone on the fluoroscopy table. Local
anesthesia was provided with 1% lidocaine without epinephrine after
prepped and draped in the usual sterile fashion. Puncture was
performed at L4-5 using a 3 1/2 inch 22-gauge spinal needle via a
right interlaminar approach. Using a single pass through the dura,
the needle was placed within the thecal sac, with return of clear
CSF. 10 mL of Isovue T-ZFF was injected into the thecal sac, with
normal opacification of the nerve roots and cauda equina consistent
with free flow within the subarachnoid space. The patient was then
moved to the Trendelenburg position and contrast flowed into the
cervical spine region.

I personally performed the lumbar puncture and administered the
intrathecal contrast. I also personally supervised acquisition of
the myelogram images.
FINDINGS: CERVICAL MYELOGRAM FINDINGS:

There are ventral extradural defects from C3-4 to C5-6 with at most
mild spinal stenosis. There is partial effacement of the C6 nerve
root sleeves bilaterally and of the left C7 nerve root sleeve.

CT CERVICAL MYELOGRAM FINDINGS:

There is straightening of the normal cervical lordosis without
listhesis. No fracture or suspicious osseous lesion is identified.
Disc space narrowing is mild at C4-5 and C5-6 and moderate at C6-7.
Degenerative endplate sclerosis and spurring are noted at C5-6 and
C6-7.

Mild centrilobular emphysema is noted in the lung apices. There is a
5 mm peripherally calcified nodule in the right thyroid lobe which
is considered clinically insignificant and for which no imaging
follow-up is recommended. Mild calcified plaque is noted at the
carotid bifurcations.

C2-3: Negative.

C3-4: Mild right facet arthrosis and at most minimal disc bulging
without stenosis, unchanged.

C4-5: Disc bulging, uncovertebral spurring, and mild right facet
arthrosis result in mild spinal stenosis without cord compression or
neural foraminal stenosis, unchanged.

C5-6: Disc bulging, uncovertebral spurring, and mild facet arthrosis
result in mild spinal stenosis and mild-to-moderate right and
moderate left neural foraminal stenosis, unchanged. There is a
punctate focus of gas anteroinferiorly in the right neural foramen
which is likely arising from the disc space, however a soft disc
protrusion is not evident.

C6-7: Disc bulging, left greater than right uncovertebral spurring,
and mild right and moderate left facet arthrosis result in mild left
neural foraminal stenosis without spinal stenosis, unchanged.

C7-T1: Moderate to severe left greater than right facet arthrosis
result in mild left neural foraminal stenosis, unchanged. No spinal
stenosis.
IMPRESSION: 1. Unchanged cervical disc degeneration, most notable at C5-6 where
there is mild spinal stenosis and mild-to-moderate right and
moderate left neural foraminal stenosis.
2. Mild spinal stenosis at C4-5.
3. Mild left neural foraminal stenosis at C6-7 and C7-T1.
4. Emphysema ([id number]).

## 2022-09-17 DIAGNOSIS — M545 Low back pain, unspecified: Secondary | ICD-10-CM | POA: Diagnosis not present

## 2022-09-18 DIAGNOSIS — M545 Low back pain, unspecified: Secondary | ICD-10-CM | POA: Diagnosis not present

## 2022-09-19 DIAGNOSIS — M545 Low back pain, unspecified: Secondary | ICD-10-CM | POA: Diagnosis not present

## 2022-09-25 ENCOUNTER — Other Ambulatory Visit: Payer: Self-pay | Admitting: Nurse Practitioner

## 2022-09-25 DIAGNOSIS — R5383 Other fatigue: Secondary | ICD-10-CM

## 2022-09-25 DIAGNOSIS — I10 Essential (primary) hypertension: Secondary | ICD-10-CM

## 2022-09-29 ENCOUNTER — Telehealth: Payer: Self-pay | Admitting: *Deleted

## 2022-09-29 NOTE — Telephone Encounter (Signed)
LVM to call office to reschedule appointment with heather on 7/3. Will also send message in [place] and let them know that we are cancelling the appointment and that they should call to reschedule at their earliest convenience.

## 2022-10-01 ENCOUNTER — Other Ambulatory Visit: Payer: Medicare Other

## 2022-10-01 DIAGNOSIS — I10 Essential (primary) hypertension: Secondary | ICD-10-CM | POA: Diagnosis not present

## 2022-10-01 DIAGNOSIS — R5383 Other fatigue: Secondary | ICD-10-CM | POA: Diagnosis not present

## 2022-10-02 LAB — CBC WITH DIFFERENTIAL/PLATELET
Basophils Absolute: 0.1 10*3/uL (ref 0.0–0.2)
Basos: 1 %
EOS (ABSOLUTE): 0.4 10*3/uL (ref 0.0–0.4)
Eos: 3 %
Hematocrit: 42.1 % (ref 34.0–46.6)
Hemoglobin: 13.7 g/dL (ref 11.1–15.9)
Immature Grans (Abs): 0.1 10*3/uL (ref 0.0–0.1)
Immature Granulocytes: 1 %
Lymphocytes Absolute: 3 10*3/uL (ref 0.7–3.1)
Lymphs: 23 %
MCH: 31.5 pg (ref 26.6–33.0)
MCHC: 32.5 g/dL (ref 31.5–35.7)
MCV: 97 fL (ref 79–97)
Monocytes Absolute: 1 10*3/uL — ABNORMAL HIGH (ref 0.1–0.9)
Monocytes: 8 %
Neutrophils Absolute: 8.2 10*3/uL — ABNORMAL HIGH (ref 1.4–7.0)
Neutrophils: 64 %
Platelets: 270 10*3/uL (ref 150–450)
RBC: 4.35 x10E6/uL (ref 3.77–5.28)
RDW: 12.3 % (ref 11.7–15.4)
WBC: 12.7 10*3/uL — ABNORMAL HIGH (ref 3.4–10.8)

## 2022-10-02 LAB — COMPREHENSIVE METABOLIC PANEL
ALT: 20 IU/L (ref 0–32)
AST: 16 IU/L (ref 0–40)
Albumin: 4.1 g/dL (ref 3.9–4.9)
Alkaline Phosphatase: 138 IU/L — ABNORMAL HIGH (ref 44–121)
BUN/Creatinine Ratio: 13 (ref 12–28)
BUN: 13 mg/dL (ref 8–27)
Bilirubin Total: 0.5 mg/dL (ref 0.0–1.2)
CO2: 21 mmol/L (ref 20–29)
Calcium: 9.6 mg/dL (ref 8.7–10.3)
Chloride: 106 mmol/L (ref 96–106)
Creatinine, Ser: 1.04 mg/dL — ABNORMAL HIGH (ref 0.57–1.00)
Globulin, Total: 2.5 g/dL (ref 1.5–4.5)
Glucose: 100 mg/dL — ABNORMAL HIGH (ref 70–99)
Potassium: 5 mmol/L (ref 3.5–5.2)
Sodium: 145 mmol/L — ABNORMAL HIGH (ref 134–144)
Total Protein: 6.6 g/dL (ref 6.0–8.5)
eGFR: 59 mL/min/{1.73_m2} — ABNORMAL LOW (ref >59)

## 2022-10-06 NOTE — Progress Notes (Signed)
Mildly elevated white blood cell count.  Other labs stable.  Discuss at next visit.

## 2022-10-08 ENCOUNTER — Ambulatory Visit: Payer: Medicare Other | Admitting: Nurse Practitioner

## 2022-10-13 ENCOUNTER — Ambulatory Visit (INDEPENDENT_AMBULATORY_CARE_PROVIDER_SITE_OTHER): Payer: Medicare Other | Admitting: Family Medicine

## 2022-10-13 ENCOUNTER — Encounter: Payer: Self-pay | Admitting: Family Medicine

## 2022-10-13 VITALS — BP 129/76 | HR 80 | Resp 18 | Ht 67.0 in | Wt 241.0 lb

## 2022-10-13 DIAGNOSIS — Z716 Tobacco abuse counseling: Secondary | ICD-10-CM | POA: Diagnosis not present

## 2022-10-13 DIAGNOSIS — J449 Chronic obstructive pulmonary disease, unspecified: Secondary | ICD-10-CM

## 2022-10-13 DIAGNOSIS — I10 Essential (primary) hypertension: Secondary | ICD-10-CM | POA: Diagnosis not present

## 2022-10-13 DIAGNOSIS — Z720 Tobacco use: Secondary | ICD-10-CM

## 2022-10-13 NOTE — Assessment & Plan Note (Signed)
Followed by [facility] pulmonology, last office visit 07/21/2022.  At that visit, quit date set for Mother's Day 2024.  Continue Breztri inhaler, repeat lung cancer screening CT. congratulated her on her efforts to quit smoking thus far, we will discuss more in the future about discontinuing vaping as well.

## 2022-10-13 NOTE — Assessment & Plan Note (Signed)
BP goal <130/80, stable at goal.  Continue losartan 50 mg daily, metoprolol succinate 100 mg daily, spironolactone 25 mg daily.  Will continue to monitor.

## 2022-10-13 NOTE — Patient Instructions (Signed)
I am so impressed that you have not picked up a cigarette for 2 months now, keep up the good work!  As time goes on, if you do need more support going through the baby steps of hopefully stopping vaping as well, just let me know.  Before I see you next time, call your insurance company to ask which weight loss medications they will cover.  Common ones include: -phentermine -Contrave -Qsymia -Saxenda -Wegovy -Zepbound

## 2022-10-13 NOTE — Progress Notes (Signed)
Established Patient Office Visit  Subjective   Patient ID: Katrina [name], female    DOB: [birth year]  Age: 67 y.o. MRN: [id number]  Chief Complaint  Patient presents with   Anxiety   Depression   COPD    HPI Katrina [name] is a 67 y.o. female presenting today for follow up of mood, COPD. COPD: Currently on Breztri for maintenance, albuterol for rescue.  Mood: Patient is here to follow up for anxiety and depression, currently managing with Cymbalta. Taking medication without side effects, reports excellent compliance with treatment. Denies mood changes or SI/HI. She feels mood is worse since last visit, mostly secondary to the significant back pain she experiences daily. Denies chest pain, difficulty concentrating, dizziness, fatigue, insomnia, irritability, palpitations, panic attacks, racing thoughts, SOB, sweating. Denies anhedonia, depressed mood, difficulty concentrating, fatigue, feelings of worthlessness/guilt, hopelessness, hypersomnia, impaired memory, insomnia, psychomotor agitation, psychomotor retardation, recurrent thoughts of death, weight changes.     October 31, 2022    1:35 PM 08/25/2022   11:16 AM 07/31/2022    1:12 PM  Depression screen PHQ 2/9  Decreased Interest 3 1 0  Down, Depressed, Hopeless 3 1 0  PHQ - 2 Score 6 2 0  Altered sleeping 3  0  Tired, decreased energy 3  0  Change in appetite 3  0  Feeling bad or failure about yourself  3  0  Trouble concentrating 3  0  Moving slowly or fidgety/restless 0  0  Suicidal thoughts 0  0  PHQ-9 Score 21  0  Difficult doing work/chores Somewhat difficult         2022-10-31    1:35 PM 07/08/2022    3:30 PM 03/07/2021    3:05 PM 12/05/2020   11:22 AM  GAD 7 : Generalized Anxiety Score  Nervous, Anxious, on Edge 1 0 1 0  Control/stop worrying 3 3 3  0  Worry too much - different things 3 3 0 0  Trouble relaxing 1 0 1 1  Restless 0 0 0 0  Easily annoyed or irritable 3 0 0 3  Afraid - awful might happen 0 0 0 0   Total GAD 7 Score 11 6 5 4   Anxiety Difficulty Very difficult  Somewhat difficult    ROS Negative unless otherwise noted in HPI   Objective:     BP 129/76 (BP Location: Left Arm, Patient Position: Sitting, Cuff Size: Large)   Pulse 80   Resp 18   Ht 5\' 7"  (1.702 m)   Wt 241 lb (109.3 kg)   SpO2 94%   BMI 37.75 kg/m   Physical Exam Constitutional:      General: She is not in acute distress.    Appearance: Normal appearance.  HENT:     Head: Normocephalic and atraumatic.  Cardiovascular:     Rate and Rhythm: Normal rate and regular rhythm.     Heart sounds: No murmur heard.    No friction rub. No gallop.  Pulmonary:     Effort: Pulmonary effort is normal. No respiratory distress.     Breath sounds: No wheezing, rhonchi or rales.  Musculoskeletal:     Cervical back: Normal range of motion.  Skin:    General: Skin is warm and dry.  Neurological:     General: No focal deficit present.     Mental Status: She is alert and oriented to person, place, and time. Mental status is at baseline.  Psychiatric:        Mood  and Affect: Mood normal.        Thought Content: Thought content normal.        Judgment: Judgment normal.     Assessment & Plan:  Essential hypertension Assessment & Plan: BP goal <130/80, stable at goal.  Continue losartan 50 mg daily, metoprolol succinate 100 mg daily, spironolactone 25 mg daily.  Will continue to monitor.   Chronic obstructive pulmonary disease, unspecified COPD type [facility]) Assessment & Plan: Followed by [facility] pulmonology, last office visit 07/21/2022.  At that visit, quit date set for Mother's Day 2024.  Continue Breztri inhaler, repeat lung cancer screening CT. congratulated her on her efforts to quit smoking thus far, we will discuss more in the future about discontinuing vaping as well.   Tobacco abuse [Z72.0] Assessment & Plan: Patient has not smoked a cigarette in 2 months, she switched to vaping.  She states that she no longer  has the craving for cigarette.  Encouraged her to continue her efforts, congratulated her for not having any cigarettes.  She is aware that in the future, we can discuss further support to discontinue the vaping as well.   Encounter for tobacco use cessation counseling  As she continues to work on smoking cessation, she is also interested in losing weight.  She has never weighed as much as she does now and would like to find ways to lose weight to feel better.  Advised her to call her insurance company to find which antiobesity medications they will cover before a follow-up appointment in 3 months to focus on weight management.  Patient verbalized understanding and is agreeable to this plan.  Return in about 3 months (around 01/13/2023) for follow-up to discuss weight management.    Melida Quitter, PA

## 2022-10-13 NOTE — Assessment & Plan Note (Signed)
Patient has not smoked a cigarette in 2 months, she switched to vaping.  She states that she no longer has the craving for cigarette.  Encouraged her to continue her efforts, congratulated her for not having any cigarettes.  She is aware that in the future, we can discuss further support to discontinue the vaping as well.

## 2022-10-16 ENCOUNTER — Ambulatory Visit (INDEPENDENT_AMBULATORY_CARE_PROVIDER_SITE_OTHER): Payer: Medicare Other | Admitting: Orthopedic Surgery

## 2022-10-16 DIAGNOSIS — M438X9 Other specified deforming dorsopathies, site unspecified: Secondary | ICD-10-CM

## 2022-10-16 NOTE — Progress Notes (Addendum)
Orthopedic Spine Surgery Office Note   Assessment: Patient is a 67 y.o. female with acute exacerbation of chronic low back pain after a fall. No radicular leg pain. 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 SVA of 9     Plan: -Patient has tried activity modification, Tylenol, ibuprofen, PT, home exercises, pain management -She does not feel that she is getting any benefit with pain management, so I talked about referral to a different pain management practice -I told her that she does have degenerative disc disease and sagittal imbalance that may be contributing to her back pain.  I talked about correcting her sagittal imbalance but would require her to get down to a BMI of 35 (weight 220) and be nicotine free (she has quit). -We talked about nutrition today because she was asking about how to lose weight. I told her to eat a diet rich in fruits and vegetables. I told her to eliminate soda and alcohol. I also told her to minimize snacking and decrease processes foods. We talked about this topic for . -If she is down to 220 pounds at her next visit, will get nutrition labs and talk about anterior-posterior surgery to correct her sagittal imbalance -Patient should return to office in 12 weeks, x-rays at next visit: scoliosis films and supine lateral lumbar     Patient expressed understanding of the plan and all questions were answered to the patient's satisfaction.    ___________________________________________________________________________   History: Patient is a 67 y.o. female who has been previously seen in the office for symptoms of low back pain.  Patient has been established with pain management but is not finding that helpful at this point.  She is having significant back pain that she feels on a daily basis.  She says she can only do activities around the house for about 5 minutes before she has to sit down.  As the day goes on, she finds she has to lay down more in order to  deal with her back pain.  She is not having any pain radiating into her lower extremities.  She does feel it radiate into the lumbar paraspinal muscles.  She has not noticed any weakness.  No bowel or bladder incontinence.  No saddle anesthesia.   Previous treatments: activity modification, Tylenol, ibuprofen, PT, home exercises, pain management   Physical Exam:   General: no acute distress, appears stated age Neurologic: alert, answering questions appropriately, following commands Respiratory: unlabored breathing on room air, symmetric chest rise Psychiatric: appropriate affect, normal cadence to speech     MSK (spine):   -Strength exam                                                   Left                  Right EHL                              -/5                   -/5 TA                                 5/5  5/5 GSC                             5/5                  5/5 Knee extension            5/5                  5/5 Hip flexion                    5/5                  5/5   Unable to test EHL as has had bilateral bunion surgery   -Sensory exam                           Sensation intact to light touch in L3-S1 nerve distributions of bilateral lower extremities   -Achilles DTR: 2/4 on the left, 2/4 on the right -Patellar tendon DTR: 2/4 on the left, 2/4 on the right   -Straight leg raise: negative bilaterally -Femoral nerve stretch test: negative bilaterally -Clonus: no beats bilaterally   -Left hip exam: No pain through range of motion, negative Stinchfield, negative FABER -Right hip exam: No pain through range of motion, positive Stinchfield, negative FABER   Imaging: XR of the lumbar spine from 07/03/2022 was previously independently reviewed and interpreted, showing disc loss at L1/2, L4/5, L5/S1.  Intra osteophyte formation at multiple levels.  No fracture or dislocation seen.  Loss of lumbar lordosis.  PI of 35, LL of 12.   XR scoli from 04/03/2022 was  previously independently reviewed and interpreted, showing PI of 35, LL of 15. SVA of 9cm.   MRI of the 09/02/2022 was independently reviewed and interpreted, showing DDD throughout the lumbar spine.  Lateral recess stenosis and central stenosis at L3/4.  Prior decompression at L4/5 and L5/S1.  Foraminal stenosis at L4/5 and L5/S1 bilaterally.     Patient name: Katrina [name] Patient MRN: [id number] Date of visit: 10/16/22

## 2022-10-17 DIAGNOSIS — M545 Low back pain, unspecified: Secondary | ICD-10-CM | POA: Diagnosis not present

## 2022-10-20 ENCOUNTER — Encounter: Payer: Medicare Other | Admitting: Physical Medicine & Rehabilitation

## 2022-11-07 ENCOUNTER — Ambulatory Visit: Payer: Medicare Other | Admitting: Internal Medicine

## 2022-11-16 ENCOUNTER — Other Ambulatory Visit: Payer: Self-pay | Admitting: Internal Medicine

## 2022-11-17 DIAGNOSIS — M545 Low back pain, unspecified: Secondary | ICD-10-CM | POA: Diagnosis not present

## 2022-12-01 IMAGING — RF DG C-ARM 1-60 MIN
1 series · 1 of 1 positions shown · non-contrast
Comparison: Same day intraoperative fluoroscopic image.

CLINICAL DATA: C5-C6 foraminotomy

EXAM:
DG C-ARM 1-60 MIN; DG CERVICAL SPINE - 1 VIEW
FLUOROSCOPY TIME:  Fluoroscopy Time:  21 seconds.
Radiation: 5.85 mGy.

[Series 1: run · 1 of 1 slices shown]
[im 1/1]
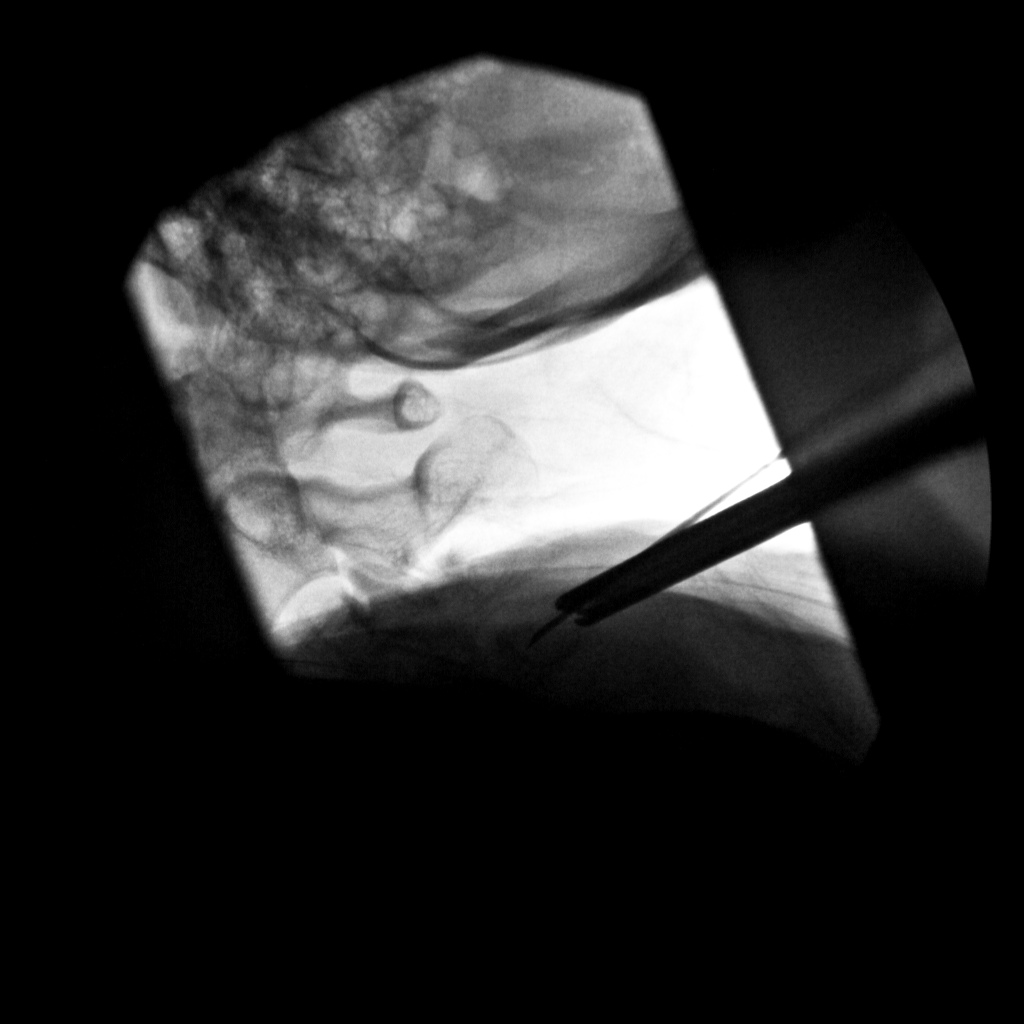

[1 of 1 positions shown; findings below may reference images not displayed]

FINDINGS: Single C-arm fluoroscopic images were obtained intraoperatively and
submitted for post operative interpretation. This image demonstrates
surgical instruments projecting in the posterior paraspinal soft
tissues at the C4 spinous process. Small field of view limits
evaluation. Please see the performing provider's procedural report
for further detail.
IMPRESSION: Intraoperative fluoroscopic image, as above.

## 2022-12-01 IMAGING — RF DG CERVICAL SPINE 1V
1 series · 1 of 1 positions shown · non-contrast
Comparison: Cervical spine radiographs 08/24/2019.

CLINICAL DATA: 64-year-old female undergoing intraoperative
cervical spine localization.

EXAM:
DG CERVICAL SPINE - 1 VIEW

[Series 1: run · 1 of 1 slices shown]
[im 1/1]
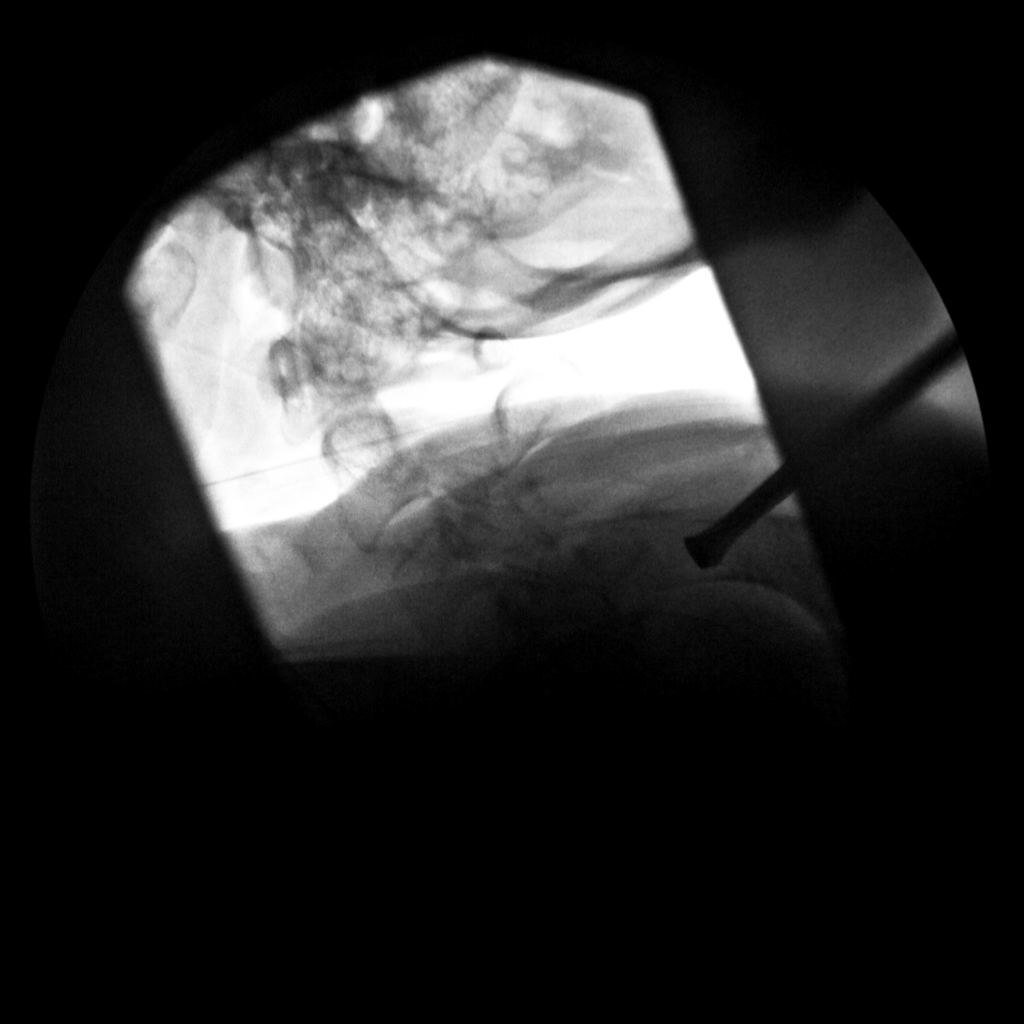

[1 of 1 positions shown; findings below may reference images not displayed]

FINDINGS: Lateral coned-down intraoperative fluoroscopic spot view of the
upper cervical spine at 7368 hours.

A surgical probe is directed toward the C4 spinous process. The tip
could be at or just below the C4 spinous process. Straightening of
upper cervical vertebrae is stable from May.

No fluoroscopy time specified.
IMPRESSION: Intraoperative localization at or just below the C4 spinous process.

Study discussed by telephone with Dr. HARZ MAHER on 04/02/2020 at

## 2022-12-18 DIAGNOSIS — M545 Low back pain, unspecified: Secondary | ICD-10-CM | POA: Diagnosis not present

## 2022-12-18 NOTE — Progress Notes (Addendum)
Cardiology Office Note:    Date:  12/19/2022  ID:  Katrina [name], DOB [birth year], MRN [id number] PCP: Melida Quitter, PA  [facility] Providers Cardiologist:  Orbie Pyo, MD       Patient Profile:      (HFrEF) heart failure with reduced ejection fraction Nonischemic cardiomyopathy LHC 04/26/2021: Normal coronary arteries TTE 01/08/2022: EF 35-40 Monitor 01/2022: NSVT (9 beats), <1% PACs, 2% PVCs TTE 08/12/2022: EF 35-40, global HK, mild LVH, normal RVSF, moderate LAE, trivial MR, RAP 3 GDMT limited by finances  Aortic atherosclerosis Left bundle-branch block Hyperlipidemia Chronic kidney disease Obesity Tobacco use Carotid US 08/20/2021: Bilateral ICA 1-39 AAA [place] 08/20/2021: No AAA         History of Present Illness:  Discussed the use of AI scribe software for clinical note transcription with the patient, who gave verbal consent to proceed.    Katrina [name] is a 67 y.o. female who returns for follow up of CHF.  She was last seen by Dr. Lynnette Caffey in January 2024. She is here alone today. Her husband passed away in 2022-11-07. She quit smoking 4 mos ago. She describes nocturnal episodes of waking up with a start, feeling as if she has stopped breathing, and experiencing chest/epigastric discomfort. The patient describes the discomfort as lasting for a few minutes, and not associated with indigestion. The patient also reports lightheadedness upon standing up quickly, whether from a lying or sitting position. She has not had syncope. She is not very active due to back pain. She has not had orthopnea, leg edema, syncope. She has been told she snores.      ROS: See HPI. +bruising No melena, hematochezia    Studies Reviewed:   EKG Interpretation Date/Time:  Friday December 19 2022 08:53:22 EDT Ventricular Rate:  71 PR Interval:  184 QRS Duration:  150 QT Interval:  442 QTC Calculation: 480 R Axis:   -61  Text Interpretation: Sinus rhythm with occasional Premature  ventricular complexes Left bundle branch block No significant change since last tracing Confirmed by Tereso Newcomer [phone]) on 12/19/2022 8:58:26 AM    Risk Assessment/Calculations:         STOP-Bang Score:  5      Physical Exam:   VS:  BP 110/60   Pulse 90   Ht 5\' 7"  (1.702 m)   Wt 250 lb 3.2 oz (113.5 kg)   SpO2 96%   BMI 39.19 kg/m    Wt Readings from Last 3 Encounters:  12/19/22 250 lb 3.2 oz (113.5 kg)  10/13/22 241 lb (109.3 kg)  09/02/22 235 lb (106.6 kg)    Constitutional:      Appearance: Healthy appearance. Not in distress.  Pulmonary:     Breath sounds: Normal breath sounds. No wheezing. No rales.  Cardiovascular:     Normal rate. Irregularly irregular rhythm.     Murmurs: There is no murmur.  Edema:    Peripheral edema absent.  Skin:    General: Skin is warm and dry.      Assessment and Plan:     Heart Failure with Reduced Ejection Fraction EF 35-40% on recent echocardiogram in 08/2022.  Volume status stable.  She is fairly sedentary due to back pain.  She is probably NYHA IIb-III.  There is an indication in her chart that assistance was submitted for Entresto but I do not see that it was ever approved. -Continue Jardiance 25 mg daily, Toprol-XL 100 mg daily, losartan 50 mg daily,  spironolactone 25 mg daily -I will check into assistance for Entresto -Follow-up 3 months  Snoring STOP-BANG score 5.  She reports episodes of waking up suddenly.  It sounds as though she is describing apnea.  She does admit to daytime hypersomnolence. -Arrange home sleep study ([place])   Hyperlipidemia LDL was 76 in May 2024. -Continue Lipitor 80mg  daily and Zetia 10mg  daily. -Arrange follow-up fasting lipid panel, CMET in the next few weeks.  Aortic Atherosclerosis -Continue aspirin 81mg  daily and Lipitor 80mg  daily.  PVCs 2% burden on monitor in October 2023.        Dispo:  Return in about 3 months (around 03/20/2023) for Routine Follow Up, w/ Dr. Lynnette Caffey, or Tereso Newcomer, PA-C.  Signed, Tereso Newcomer, PA-C

## 2022-12-19 ENCOUNTER — Telehealth: Payer: Self-pay | Admitting: *Deleted

## 2022-12-19 ENCOUNTER — Ambulatory Visit: Payer: Medicare Other | Attending: Internal Medicine | Admitting: Physician Assistant

## 2022-12-19 ENCOUNTER — Encounter: Payer: Self-pay | Admitting: Physician Assistant

## 2022-12-19 VITALS — BP 110/60 | HR 90 | Ht 67.0 in | Wt 250.2 lb

## 2022-12-19 DIAGNOSIS — E782 Mixed hyperlipidemia: Secondary | ICD-10-CM

## 2022-12-19 DIAGNOSIS — R0683 Snoring: Secondary | ICD-10-CM | POA: Diagnosis not present

## 2022-12-19 DIAGNOSIS — I5020 Unspecified systolic (congestive) heart failure: Secondary | ICD-10-CM | POA: Diagnosis not present

## 2022-12-19 DIAGNOSIS — I493 Ventricular premature depolarization: Secondary | ICD-10-CM

## 2022-12-19 NOTE — Telephone Encounter (Signed)
Patient agreement reviewed and signed on 12/19/2022.  WatchPAT issued to patient on 12/19/2022 by Danielle Rankin, CMA. Patient aware to not open the WatchPAT box until contacted with the activation PIN. Patient profile initialized in CloudPAT on 12/19/2022 by Danielle Rankin, CMA. Device serial number: [id number]  Please list Reason for Call as Advice Only and type "WatchPAT issued to patient" in the comment box.

## 2022-12-19 NOTE — Patient Instructions (Signed)
Medication Instructions:  Your physician recommends that you continue on your current medications as directed. Please refer to the Current Medication list given to you today.  *If you need a refill on your cardiac medications before your next appointment, please call your pharmacy*   Lab Work: 01/02/23 COME TO THE LAB, FASTING FOR:  CMET & LIPID  If you have labs (blood work) drawn today and your tests are completely normal, you will receive your results only by: MyChart Message (if you have MyChart) OR A paper copy in the mail If you have any lab test that is abnormal or we need to change your treatment, we will call you to review the results.   Testing/Procedures: Your physician has recommended that you have a sleep study. This test records several body functions during sleep, including: brain activity, eye movement, oxygen and carbon dioxide blood levels, heart rate and rhythm, breathing rate and rhythm, the flow of air through your mouth and nose, snoring, body muscle movements, and chest and belly movement.    Follow-Up: At [facility], you and your health needs are our priority.  As part of our continuing mission to provide you with exceptional heart care, we have created designated Provider Care Teams.  These Care Teams include your primary Cardiologist (physician) and Advanced Practice Providers (APPs -  Physician Assistants and Nurse Practitioners) who all work together to provide you with the care you need, when you need it.  We recommend signing up for the patient portal called "MyChart".  Sign up information is provided on this After Visit Summary.  MyChart is used to connect with patients for Virtual Visits (Telemedicine).  Patients are able to view lab/test results, encounter notes, upcoming appointments, etc.  Non-urgent messages can be sent to your provider as well.   To learn more about what you can do with MyChart, go to [email or web address].    Your next  appointment:   3 month(s)  Provider:   Orbie Pyo, MD  or Tereso Newcomer, PA-C         Other Instructions

## 2022-12-22 DIAGNOSIS — F17210 Nicotine dependence, cigarettes, uncomplicated: Secondary | ICD-10-CM | POA: Diagnosis not present

## 2023-01-02 ENCOUNTER — Ambulatory Visit: Payer: Medicare Other | Attending: Physician Assistant

## 2023-01-02 DIAGNOSIS — E782 Mixed hyperlipidemia: Secondary | ICD-10-CM

## 2023-01-02 DIAGNOSIS — R0683 Snoring: Secondary | ICD-10-CM | POA: Diagnosis not present

## 2023-01-02 DIAGNOSIS — I493 Ventricular premature depolarization: Secondary | ICD-10-CM | POA: Diagnosis not present

## 2023-01-02 LAB — COMPREHENSIVE METABOLIC PANEL
ALT: 24 [IU]/L (ref 0–32)
AST: 24 [IU]/L (ref 0–40)
Albumin: 4.5 g/dL (ref 3.9–4.9)
Alkaline Phosphatase: 135 [IU]/L — ABNORMAL HIGH (ref 44–121)
BUN/Creatinine Ratio: 12 (ref 12–28)
BUN: 13 mg/dL (ref 8–27)
Bilirubin Total: 0.8 mg/dL (ref 0.0–1.2)
CO2: 23 mmol/L (ref 20–29)
Calcium: 9.5 mg/dL (ref 8.7–10.3)
Chloride: 104 mmol/L (ref 96–106)
Creatinine, Ser: 1.1 mg/dL — ABNORMAL HIGH (ref 0.57–1.00)
Globulin, Total: 2.5 g/dL (ref 1.5–4.5)
Glucose: 103 mg/dL — ABNORMAL HIGH (ref 70–99)
Potassium: 4.7 mmol/L (ref 3.5–5.2)
Sodium: 140 mmol/L (ref 134–144)
Total Protein: 7 g/dL (ref 6.0–8.5)
eGFR: 55 mL/min/{1.73_m2} — ABNORMAL LOW (ref >59)

## 2023-01-02 LAB — LIPID PANEL
Chol/HDL Ratio: 2.1 {ratio} (ref 0.0–4.4)
Cholesterol, Total: 154 mg/dL (ref 100–199)
HDL: 73 mg/dL (ref >39)
LDL Chol Calc (NIH): 59 mg/dL (ref 0–99)
Triglycerides: 126 mg/dL (ref 0–149)
VLDL Cholesterol Cal: 22 mg/dL (ref 5–40)

## 2023-01-12 ENCOUNTER — Telehealth: Payer: Self-pay | Admitting: Internal Medicine

## 2023-01-12 NOTE — Telephone Encounter (Signed)
Daughter Florentina Addison) stated patient received her Watchman device but wants to know if patient will be scheduled to do the sleep study or if the equipment should be returned.

## 2023-01-13 NOTE — Telephone Encounter (Signed)
Prior Authorization for [place] sent to [employer]. Reference # NO PA REQ FOR HST  PIN WAS CALLED [phone].

## 2023-01-13 NOTE — Telephone Encounter (Signed)
Prior Authorization for [place] sent to [employer]. Reference # NO PA REQ FOR HST  Ordering provider: SCOTT WEAVER Associated diagnoses: R06.83 WatchPAT PA obtained on 01/13/2023 by Latrelle Dodrill, CMA. Authorization: No; tracking ID NO PA REQ Patient notified of PIN ([phone]) on 01/13/2023 via Notification Method: phone.

## 2023-01-14 ENCOUNTER — Ambulatory Visit (INDEPENDENT_AMBULATORY_CARE_PROVIDER_SITE_OTHER): Payer: Medicare Other | Admitting: Family Medicine

## 2023-01-14 ENCOUNTER — Encounter: Payer: Self-pay | Admitting: Family Medicine

## 2023-01-14 ENCOUNTER — Telehealth: Payer: Self-pay

## 2023-01-14 VITALS — BP 128/69 | HR 56 | Resp 18 | Ht 67.0 in | Wt 255.0 lb

## 2023-01-14 DIAGNOSIS — I10 Essential (primary) hypertension: Secondary | ICD-10-CM | POA: Diagnosis not present

## 2023-01-14 DIAGNOSIS — I5020 Unspecified systolic (congestive) heart failure: Secondary | ICD-10-CM

## 2023-01-14 DIAGNOSIS — Z6839 Body mass index (BMI) 39.0-39.9, adult: Secondary | ICD-10-CM

## 2023-01-14 DIAGNOSIS — E66812 Obesity, class 2: Secondary | ICD-10-CM

## 2023-01-14 DIAGNOSIS — R748 Abnormal levels of other serum enzymes: Secondary | ICD-10-CM | POA: Insufficient documentation

## 2023-01-14 MED ORDER — WEGOVY 0.25 MG/0.5ML ~~LOC~~ SOAJ
0.2500 mg | SUBCUTANEOUS | 1 refills | Status: DC
Start: 2023-01-14 — End: 2023-03-16

## 2023-01-14 NOTE — Patient Instructions (Addendum)
I am so glad that we get to start on this weight management journey together!  I am hopeful that we can get Wegovy approved by insurance since it has the indication for both weight management and heart protection.  I will send in a 20-month supply, but if at the end of the first month you are doing well and would like to increase to the next dose just send me a MyChart message.  To minimize the risk of side effects, be sure to be drinking a lot of water and eat small amounts at a time since your stomach will be emptying slower.  In addition to the medication, it is important to implement habits that will keep your muscles healthy, too.  GOALS: -64 oz fluid intake daily -80 g protein daily -20 g fiber daily

## 2023-01-14 NOTE — Progress Notes (Signed)
Established Patient Office Visit  Subjective   Patient ID: Katrina [name], female    DOB: 11-10-1955  Age: 67 y.o. MRN: [id number]  Chief Complaint  Patient presents with   Weight Management Screening    HPI Katrina [name] is a 67 y.o. female presenting today to discuss weight management.  At her last, she was advised to call her insurance company to find which antiobesity medicines they will cover. Patient would like to lose weight and cites health as reasons for wanting to lose weight, especially since she needs to lose weight in order to have her back surgery. They have been following a general diet and notes often overeating since her depression has worsened after her husband died in 10-30-22. Exercise is extremely limited due to back and shoulder pain. Other potential contributing factors include:   Cymbalta  which may cause weight gain, and other comorbidities such as congestive heart failure, hypertension, and osteoarthritis. Patient is interested in discussing 42.    Outpatient Medications Prior to Visit  Medication Sig   acetaminophen (TYLENOL) 500 MG tablet Take 1,000 mg by mouth every 6 (six) hours as needed for moderate pain.   acetaminophen-codeine (TYLENOL #3) 300-30 MG tablet Take 1 tablet by mouth every 12 (twelve) hours as needed for moderate pain.   albuterol (VENTOLIN HFA) 108 (90 Base) MCG/ACT inhaler Inhale 2 puffs into the lungs every 6 (six) hours as needed for wheezing or shortness of breath.   aspirin EC 81 MG tablet Take 1 tablet (81 mg total) by mouth daily. Swallow whole.   atorvastatin (LIPITOR) 80 MG tablet Take 1 tablet by mouth once daily   Budeson-Glycopyrrol-Formoterol (BREZTRI AEROSPHERE) 160-9-4.8 MCG/ACT AERO Inhale 2 puffs into the lungs in the morning and at bedtime.   diclofenac Sodium (VOLTAREN ARTHRITIS PAIN) 1 % GEL Apply 2 g topically 4 (four) times daily as needed (pain).   DULoxetine (CYMBALTA) 20 MG capsule Take 1 capsule (20 mg total) by  mouth daily.   empagliflozin (JARDIANCE) 25 MG TABS tablet Take 1 tablet (25 mg total) by mouth daily before breakfast.   fluticasone (FLONASE) 50 MCG/ACT nasal spray Place 2 sprays into both nostrils daily.   losartan (COZAAR) 50 MG tablet Take 50 mg by mouth daily.   metoprolol succinate (TOPROL-XL) 100 MG 24 hr tablet Take 1 tablet (100 mg total) by mouth daily. Take with or immediately following a meal.   ondansetron (ZOFRAN) 4 MG tablet Take 1 tablet (4 mg total) by mouth every 8 (eight) hours as needed for nausea or vomiting.   spironolactone (ALDACTONE) 25 MG tablet Take 1 tablet (25 mg total) by mouth at bedtime.   ezetimibe (ZETIA) 10 MG tablet Take 1 tablet (10 mg total) by mouth daily.   No facility-administered medications prior to visit.    ROS Negative unless otherwise noted in HPI   Objective:     BP 128/69 (BP Location: Left Arm, Patient Position: Sitting, Cuff Size: Large)   Pulse (!) 56   Resp 18   Ht 5\' 7"  (1.702 m)   Wt 255 lb (115.7 kg)   SpO2 97%   BMI 39.94 kg/m   Physical Exam Constitutional:      General: She is not in acute distress.    Appearance: Normal appearance.  HENT:     Head: Normocephalic and atraumatic.  Cardiovascular:     Rate and Rhythm: Normal rate and regular rhythm.     Heart sounds: No murmur heard.  No friction rub. No gallop.  Pulmonary:     Effort: Pulmonary effort is normal. No respiratory distress.     Breath sounds: No wheezing, rhonchi or rales.  Skin:    General: Skin is warm and dry.  Neurological:     Mental Status: She is alert and oriented to person, place, and time.     Assessment & Plan:  Class 2 severe obesity due to excess calories with serious comorbidity and body mass index (BMI) of 39.0 to 39.9 in adult [facility]) Assessment & Plan: Starting Wegovy 0.25 mg weekly injection to support weight management as well as for cardiovascular protection with her history of hypertension and heart failure.  Is also possible  that it may be helpful for tobacco abuse.  We discussed mechanism of action, potential side effects, and best way to mitigate side effects.  We discussed the titration schedule over the next 2 months prior to her follow-up.  Patient verbalized understanding and is agreeable to this plan.  Orders: -     Wegovy; Inject 0.25 mg into the skin once a week. Use this dose for 1 month (4 shots) and then increase to next higher dose.  Dispense: 2 mL; Refill: 1  Essential hypertension -     MJ.Hock; Inject 0.25 mg into the skin once a week. Use this dose for 1 month (4 shots) and then increase to next higher dose.  Dispense: 2 mL; Refill: 1  HFrEF (heart failure with reduced ejection fraction) (HCC) Assessment & Plan: Followed by cardiology.  Start Wegovy 0.25 mg weekly for the additional cardiovascular protection and benefits of weight loss.  Orders: -     Wegovy; Inject 0.25 mg into the skin once a week. Use this dose for 1 month (4 shots) and then increase to next higher dose.  Dispense: 2 mL; Refill: 1  Elevated alkaline phosphatase level Assessment & Plan: Alkaline phosphatase has been persistently elevated for about 18 months and has not been worked up until this time.  Starting with hepatic function panel and gamma GT.  Results will guide next steps in management/evaluation.  Orders: -     Hepatic function panel; Future -     Gamma GT    Return in about 2 months (around 03/16/2023) for follow-up for weight management.    Melida Quitter, PA

## 2023-01-14 NOTE — Assessment & Plan Note (Signed)
Followed by cardiology.  Start Wegovy 0.25 mg weekly for the additional cardiovascular protection and benefits of weight loss.

## 2023-01-14 NOTE — Assessment & Plan Note (Signed)
Alkaline phosphatase has been persistently elevated for about 18 months and has not been worked up until this time.  Starting with hepatic function panel and gamma GT.  Results will guide next steps in management/evaluation.

## 2023-01-14 NOTE — Assessment & Plan Note (Signed)
Starting Wegovy 0.25 mg weekly injection to support weight management as well as for cardiovascular protection with her history of hypertension and heart failure.  Is also possible that it may be helpful for tobacco abuse.  We discussed mechanism of action, potential side effects, and best way to mitigate side effects.  We discussed the titration schedule over the next 2 months prior to her follow-up.  Patient verbalized understanding and is agreeable to this plan.

## 2023-01-14 NOTE — Telephone Encounter (Signed)
Katrina [name] is calling about the PA they received for [facility].  They need clinical info of  Documentation of est cardiovascular disease.  Please call [phone]

## 2023-01-15 LAB — HEPATIC FUNCTION PANEL
ALT: 24 [IU]/L (ref 0–32)
AST: 22 [IU]/L (ref 0–40)
Albumin: 4.4 g/dL (ref 3.9–4.9)
Alkaline Phosphatase: 127 [IU]/L — ABNORMAL HIGH (ref 44–121)
Bilirubin Total: 0.6 mg/dL (ref 0.0–1.2)
Bilirubin, Direct: 0.16 mg/dL (ref 0.00–0.40)
Total Protein: 6.8 g/dL (ref 6.0–8.5)

## 2023-01-15 LAB — GAMMA GT: GGT: 36 [IU]/L (ref 0–60)

## 2023-01-15 NOTE — Addendum Note (Signed)
Addended by: Saralyn Pilar on: 01/15/2023 12:05 PM   Modules accepted: Orders

## 2023-01-15 NOTE — Telephone Encounter (Signed)
Attempted to call the number provided and received a message that no one was available to take calls.  Office notes have been faxed to [phone]

## 2023-01-16 ENCOUNTER — Ambulatory Visit: Payer: Medicare Other | Admitting: Orthopedic Surgery

## 2023-01-17 DIAGNOSIS — M545 Low back pain, unspecified: Secondary | ICD-10-CM | POA: Diagnosis not present

## 2023-01-19 NOTE — Telephone Encounter (Signed)
Katrina [name] with [employer] calling to say that she has received the clinical information and will begin processing, she said if anymore information needs to be sent it can be faxed to [phone]. If you need to get in touch with her she can be reached at [phone].

## 2023-01-23 ENCOUNTER — Encounter (INDEPENDENT_AMBULATORY_CARE_PROVIDER_SITE_OTHER): Payer: Medicare Other | Admitting: Cardiology

## 2023-01-23 DIAGNOSIS — G4733 Obstructive sleep apnea (adult) (pediatric): Secondary | ICD-10-CM | POA: Diagnosis not present

## 2023-02-03 ENCOUNTER — Ambulatory Visit: Payer: Medicare Other | Attending: Physician Assistant

## 2023-02-03 DIAGNOSIS — R0683 Snoring: Secondary | ICD-10-CM

## 2023-02-03 NOTE — Procedures (Signed)
SLEEP STUDY REPORT Patient Information Study Date: 01/23/2023 Patient Name: Katrina [name] Patient ID: [id number] Birth Date: 02/02/1956 Age: 67 Gender: Female BMI: 39.4 (W=251 lb, H=5' 7'') Stopbang: 5 Referring Physician: Tereso Newcomer, PA  TEST DESCRIPTION: Home sleep apnea testing was completed using the WatchPat, a Type 1 device, utilizing peripheral arterial tonometry (PAT), chest movement, actigraphy, pulse oximetry, pulse rate, body position and snore. AHI was calculated with apnea and hypopnea using valid sleep time as the denominator. RDI includes apneas, hypopneas, and RERAs. The data acquired and the scoring of sleep and all associated events were performed in accordance with the recommended standards and specifications as outlined in the AASM Manual for the Scoring of Sleep and Associated Events 2.2.0 (2015).  FINDINGS:  1. Mild to moderate Obstructive Sleep Apnea with AHI 13.6/hr overall but moderate during REM sleep with REM AHI 25.1/hr.  2. No Central Sleep Apnea with pAHIc 0.6/hr.  3. Oxygen desaturations as low as 79%.  4. Moderate snoring was present. O2 sats were < 88% for 27.7 min.  5. Total sleep time was 6hrs and 20 min.  6. 25.1% of total sleep time was spent in REM sleep.  7. Shortened sleep onset latency at 5 min.  8. Shortened REM sleep onset latency at 29 min.  9. Total awakenings were 13. 10. Arrhythmia detection: None  DIAGNOSIS: Mild to Moderate Obstructive Sleep Apnea (G47.33) Nocturnal Hypoxemia  RECOMMENDATIONS: 1. Clinical correlation of these findings is necessary. The decision to treat obstructive sleep apnea (OSA) is usually based on the presence of apnea symptoms or the presence of associated medical conditions such as Hypertension, Congestive Heart Failure, Atrial Fibrillation or Obesity. The most common symptoms of OSA are snoring, gasping for breath while sleeping, daytime sleepiness and fatigue.  2. Initiating apnea therapy is  recommended given the presence of symptoms and/or associated conditions. Recommend proceeding with one of the following:   a. Auto-CPAP therapy with a pressure range of 5-20cm H2O.   b. An oral appliance (OA) that can be obtained from certain dentists with expertise in sleep medicine. These are primarily of use in non-obese patients with mild and moderate disease.   c. An ENT consultation which may be useful to look for specific causes of obstruction and possible treatment options.   d. If patient is intolerant to PAP therapy, consider referral to ENT for evaluation for hypoglossal nerve stimulator.  3. Close follow-up is necessary to ensure success with CPAP or oral appliance therapy for maximum benefit .  4. A follow-up oximetry study on CPAP is recommended to assess the adequacy of therapy and determine the need for supplemental oxygen or the potential need for Bi-level therapy. An arterial blood gas to determine the adequacy of baseline ventilation and oxygenation should also be considered.  5. Healthy sleep recommendations include: adequate nightly sleep (normal 7-9 hrs/night), avoidance of caffeine after noon and alcohol near bedtime, and maintaining a sleep environment that is cool, dark and quiet.  6. Weight loss for overweight patients is recommended. Even modest amounts of weight loss can significantly improve the severity of sleep apnea.  7. Snoring recommendations include: weight loss where appropriate, side sleeping, and avoidance of alcohol before bed. 8. Operation of motor vehicle should be avoided when sleepy.  Signature: Armanda Magic, MD; [facility]; Diplomat, American Board of Sleep Medicine Electronically Signed: 02/03/2023 4:23:08 AM

## 2023-02-08 ENCOUNTER — Other Ambulatory Visit: Payer: Self-pay | Admitting: Internal Medicine

## 2023-02-10 ENCOUNTER — Other Ambulatory Visit (HOSPITAL_COMMUNITY): Payer: Self-pay

## 2023-02-11 ENCOUNTER — Other Ambulatory Visit (INDEPENDENT_AMBULATORY_CARE_PROVIDER_SITE_OTHER): Payer: Medicare Other

## 2023-02-11 ENCOUNTER — Encounter: Payer: Self-pay | Admitting: "Endocrinology

## 2023-02-11 ENCOUNTER — Other Ambulatory Visit: Payer: Self-pay

## 2023-02-11 DIAGNOSIS — R748 Abnormal levels of other serum enzymes: Secondary | ICD-10-CM | POA: Diagnosis not present

## 2023-02-11 DIAGNOSIS — R7989 Other specified abnormal findings of blood chemistry: Secondary | ICD-10-CM | POA: Diagnosis not present

## 2023-02-11 LAB — COMPREHENSIVE METABOLIC PANEL
ALT: 19 U/L (ref 0–35)
AST: 19 U/L (ref 0–37)
Albumin: 4.3 g/dL (ref 3.5–5.2)
Alkaline Phosphatase: 133 U/L — ABNORMAL HIGH (ref 39–117)
BUN: 12 mg/dL (ref 6–23)
CO2: 26 meq/L (ref 19–32)
Calcium: 9.8 mg/dL (ref 8.4–10.5)
Chloride: 105 meq/L (ref 96–112)
Creatinine, Ser: 1.12 mg/dL (ref 0.40–1.20)
GFR: 51.05 mL/min — ABNORMAL LOW (ref >60.00)
Glucose, Bld: 119 mg/dL — ABNORMAL HIGH (ref 70–99)
Potassium: 4.2 meq/L (ref 3.5–5.1)
Sodium: 140 meq/L (ref 135–145)
Total Bilirubin: 0.9 mg/dL (ref 0.2–1.2)
Total Protein: 7.6 g/dL (ref 6.0–8.3)

## 2023-02-11 NOTE — Progress Notes (Signed)
Erroneous

## 2023-02-12 LAB — VITAMIN D 25 HYDROXY (VIT D DEFICIENCY, FRACTURES): Vit D, 25-Hydroxy: 12 ng/mL — ABNORMAL LOW (ref 30–100)

## 2023-02-13 ENCOUNTER — Encounter: Payer: Self-pay | Admitting: "Endocrinology

## 2023-02-13 ENCOUNTER — Ambulatory Visit: Payer: Medicare Other | Admitting: "Endocrinology

## 2023-02-13 VITALS — BP 140/80 | HR 37 | Ht 67.0 in | Wt 259.0 lb

## 2023-02-13 DIAGNOSIS — R7989 Other specified abnormal findings of blood chemistry: Secondary | ICD-10-CM

## 2023-02-13 DIAGNOSIS — R748 Abnormal levels of other serum enzymes: Secondary | ICD-10-CM

## 2023-02-13 MED ORDER — VITAMIN D (ERGOCALCIFEROL) 1.25 MG (50000 UNIT) PO CAPS: 50000.0000 [IU] | ORAL_CAPSULE | ORAL | 0 refills | Status: DC
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
## 2023-02-17 DIAGNOSIS — M545 Low back pain, unspecified: Secondary | ICD-10-CM | POA: Diagnosis not present

## 2023-02-27 ENCOUNTER — Telehealth: Payer: Self-pay | Admitting: *Deleted

## 2023-02-27 DIAGNOSIS — R0683 Snoring: Secondary | ICD-10-CM

## 2023-02-27 DIAGNOSIS — I10 Essential (primary) hypertension: Secondary | ICD-10-CM

## 2023-02-27 DIAGNOSIS — G4733 Obstructive sleep apnea (adult) (pediatric): Secondary | ICD-10-CM

## 2023-02-27 NOTE — Telephone Encounter (Signed)
-----   Message from Armanda Magic sent at 02/03/2023  4:24 AM EDT ----- Please let patient know that they have sleep apnea.  Recommend therapeutic CPAP titration for treatment of patient's sleep disordered breathing.  If unable to perform an in lab titration then initiate ResMed auto CPAP from 4 to 15cm H2O with heated humidity and mask of choice and overnight pulse ox on CPAP.

## 2023-02-27 NOTE — Telephone Encounter (Signed)
The patient has been notified of the result and verbalized understanding.  All questions (if any) were answered. Latrelle Dodrill, CMA 02/27/2023 2:01 PM    Precert titration

## 2023-03-16 ENCOUNTER — Ambulatory Visit (INDEPENDENT_AMBULATORY_CARE_PROVIDER_SITE_OTHER): Payer: Medicare Other | Admitting: Family Medicine

## 2023-03-16 VITALS — BP 134/83 | HR 78 | Resp 18 | Ht 67.0 in | Wt 256.0 lb

## 2023-03-16 DIAGNOSIS — E66812 Obesity, class 2: Secondary | ICD-10-CM | POA: Diagnosis not present

## 2023-03-16 DIAGNOSIS — L739 Follicular disorder, unspecified: Secondary | ICD-10-CM

## 2023-03-16 DIAGNOSIS — Z6839 Body mass index (BMI) 39.0-39.9, adult: Secondary | ICD-10-CM | POA: Diagnosis not present

## 2023-03-16 DIAGNOSIS — M7989 Other specified soft tissue disorders: Secondary | ICD-10-CM | POA: Diagnosis not present

## 2023-03-16 NOTE — Progress Notes (Signed)
Established Patient Office Visit  Subjective   Patient ID: Katrina [name], female    DOB: 1955/07/28  Age: 67 y.o. MRN: [id number]  Chief Complaint  Patient presents with   Weight Management Screening    HPI Katrina [name] is a 67 y.o. female presenting today for weight management.  At last appointment, discussed starting Wegovy 0.25 mg weekly for weight management and cardiovascular protection due to history of hypertension and heart failure.  She was not able to start the [facility] because even with insurance, she would have to pay $100 out-of-pocket every month.  Weight has decreased 3 lbs since last visit. She has been eating a lot more chicken, fruits, and vegetables. She also has a concern about a "nodule" on the back of her head.  She first noticed the spot when she woke up this morning.  Her daughter told her that it looked like a clogged hair follicle but she wanted it to be looked at by her PCP.  It is tender and slightly itchy.  Additionally, she has a soft tissue mass on her right neck/shoulder that she feels has been increasing in size and becoming more solid.   Outpatient Medications Prior to Visit  Medication Sig   acetaminophen (TYLENOL) 500 MG tablet Take 1,000 mg by mouth every 6 (six) hours as needed for moderate pain.   acetaminophen-codeine (TYLENOL #3) 300-30 MG tablet Take 1 tablet by mouth every 12 (twelve) hours as needed for moderate pain.   albuterol (VENTOLIN HFA) 108 (90 Base) MCG/ACT inhaler Inhale 2 puffs into the lungs every 6 (six) hours as needed for wheezing or shortness of breath.   aspirin EC 81 MG tablet Take 1 tablet (81 mg total) by mouth daily. Swallow whole.   atorvastatin (LIPITOR) 80 MG tablet Take 1 tablet by mouth once daily   Budeson-Glycopyrrol-Formoterol (BREZTRI AEROSPHERE) 160-9-4.8 MCG/ACT AERO Inhale 2 puffs into the lungs in the morning and at bedtime.   diclofenac Sodium (VOLTAREN ARTHRITIS PAIN) 1 % GEL Apply 2 g topically 4 (four)  times daily as needed (pain).   DULoxetine (CYMBALTA) 20 MG capsule Take 1 capsule (20 mg total) by mouth daily.   empagliflozin (JARDIANCE) 25 MG TABS tablet Take 1 tablet (25 mg total) by mouth daily before breakfast.   fluticasone (FLONASE) 50 MCG/ACT nasal spray Place 2 sprays into both nostrils daily.   losartan (COZAAR) 50 MG tablet Take 50 mg by mouth daily.   metoprolol succinate (TOPROL-XL) 100 MG 24 hr tablet TAKE 1 TABLET BY MOUTH ONCE DAILY TAKE  WITH  OR  IMMEDIATELY  FOLLOWING  A  MEAL   ondansetron (ZOFRAN) 4 MG tablet Take 1 tablet (4 mg total) by mouth every 8 (eight) hours as needed for nausea or vomiting.   spironolactone (ALDACTONE) 25 MG tablet Take 1 tablet (25 mg total) by mouth at bedtime.   Vitamin D, Ergocalciferol, (DRISDOL) 1.25 MG (50000 UNIT) CAPS capsule Take 1 capsule (50,000 Units total) by mouth every 7 (seven) days.   ezetimibe (ZETIA) 10 MG tablet Take 1 tablet (10 mg total) by mouth daily.   [DISCONTINUED] WEGOVY 0.25 MG/0.5ML SOAJ Inject 0.25 mg into the skin once a week. Use this dose for 1 month (4 shots) and then increase to next higher dose. (Patient not taking: Reported on 02/13/2023)   No facility-administered medications prior to visit.    ROS Negative unless otherwise noted in HPI   Objective:     BP 134/83 (BP Location: Left Arm,  Patient Position: Sitting, Cuff Size: Large)   Pulse 78   Resp 18   Ht 5\' 7"  (1.702 m)   Wt 256 lb (116.1 kg)   SpO2 92%   BMI 40.10 kg/m   Physical Exam Constitutional:      General: She is not in acute distress.    Appearance: Normal appearance.  HENT:     Head: Normocephalic and atraumatic.  Cardiovascular:     Rate and Rhythm: Normal rate and regular rhythm.     Heart sounds: No murmur heard.    No friction rub. No gallop.  Pulmonary:     Effort: Pulmonary effort is normal. No respiratory distress.     Breath sounds: No wheezing, rhonchi or rales.  Skin:    General: Skin is warm and dry.           Comments: Mild erythema of hair follicle over occiput.  Palpable soft, mobile mass over junction between right neck and right shoulder.  Nontender.  Neurological:     Mental Status: She is alert and oriented to person, place, and time.     Assessment & Plan:  Class 2 severe obesity due to excess calories with serious comorbidity and body mass index (BMI) of 39.0 to 39.9 in adult [facility]) Assessment & Plan: Reginal Lutes approved too expensive.  Continue with nutrition interventions prioritizing fiber and protein.  If needed in the future, can reassess other medication options or consider referring to [facility].  Also encourage patient to continue cutting back on alcohol use as she has been.  Orders: -     TSH; Future -     T4, free; Future  Soft tissue mass Assessment & Plan: Etiology of palpable mass unclear on previous imaging.  Referring to dermatology for further evaluation.  Orders: -     Ambulatory referral to Dermatology  Folliculitis  Provided triple antibiotic ointment, instructed to apply 2-3 times daily for 5-7 days.  Return in about 2 months (around 05/17/2023) for follow-up for weight management and mood.   I spent 40 minutes on the day of the encounter to include pre-visit record review, face-to-face time with the patient, and post visit ordering of tests/referrals.   Melida Quitter, PA

## 2023-03-16 NOTE — Assessment & Plan Note (Signed)
Etiology of palpable mass unclear on previous imaging.  Referring to dermatology for further evaluation.

## 2023-03-16 NOTE — Patient Instructions (Addendum)
It is difficult to say exactly what the spot on your shoulder is, I did not get any clear answers from previous imaging that you have done.  I can start by referring you to dermatology, they should be calling you to schedule within the next couple of months.  I will also order some thyroid labs to check, you can do this within the next couple of weeks or closer to your follow-up appointment.  Apply the antibiotic ointment to the spot on your head 2-3 times each day.  If it is not improving after 1 week, please let me know.   PREVENTATIVE CARE: Screening It is recommended to screen people for certain conditions in order to identify those possible conditions early enough to provide adequate treatment.  The screenings that you are currently due for include: Lung cancer screening. Colorectal cancer screening. Breast cancer screening with mammogram. Talk with your health care provider about how often you should have regular mammograms. Bone density scan. This is done to screen for osteoporosis. Vaccines Vaccines are recommended to prevent certain conditions.  Different vaccines are recommended for different groups of people.  You can receive both of these vaccines at your local pharmacy.  The vaccines that you are currently due for include: Pneumonia vaccine (PCV20) Shingles 2 dose series

## 2023-03-16 NOTE — Assessment & Plan Note (Signed)
Reginal Lutes approved too expensive.  Continue with nutrition interventions prioritizing fiber and protein.  If needed in the future, can reassess other medication options or consider referring to [facility].  Also encourage patient to continue cutting back on alcohol use as she has been.

## 2023-03-19 ENCOUNTER — Telehealth: Payer: Self-pay | Admitting: "Endocrinology

## 2023-03-19 DIAGNOSIS — M545 Low back pain, unspecified: Secondary | ICD-10-CM | POA: Diagnosis not present

## 2023-03-19 NOTE — Telephone Encounter (Signed)
Patient asking to change providers from Dr. Roosevelt Locks to Dr. Lonzo Cloud( Patient states she is not comfortable with provider)

## 2023-03-19 NOTE — Progress Notes (Signed)
Cardiology Office Note:    Date:  03/20/2023  ID:  Katrina [name], DOB [birth year], MRN [id number] PCP: Melida Quitter, PA  [facility] Providers Cardiologist:  Orbie Pyo, MD       Patient Profile:      (HFrEF) heart failure with reduced ejection fraction Nonischemic cardiomyopathy LHC 04/26/2021: Normal coronary arteries TTE 01/08/2022: EF 35-40 Monitor 01/2022: NSVT (9 beats), <1% PACs, 2% PVCs TTE 08/12/2022: EF 35-40, global HK, mild LVH, normal RVSF, moderate LAE, trivial MR, RAP 3 GDMT limited by finances  Aortic atherosclerosis Left bundle-branch block Hyperlipidemia Chronic kidney disease Obesity Former tobacco use -quit 08/2022 Carotid US 08/20/2021: Bilateral ICA 1-39 AAA [place] 08/20/2021: No AAA OSA          History of Present Illness:  Discussed the use of AI scribe software for clinical note transcription with the patient, who gave verbal consent to proceed.  Katrina [name] is a 67 y.o. female who returns for follow up of CHF. She was last seen 12/2022. Home sleep test was done and was positive. She is to be set up for CPAP.  She is here alone.  She continues to struggle with issues with her back.  She needs low back surgery as well as cervical spine surgery.  She needs to lose weight first.  She is mainly limited by her back.  She is fairly sedentary.  She has not noticed shortness of breath with ADLs.  She notes improved breathing since quitting smoking.  She has not had chest pain, syncope, edema.  She continues to note awakening out of breath.  It sounds like she is describing apneic episodes.  She also notes palpitations.     Review of Systems  Musculoskeletal:  Positive for back pain and joint pain.  See HPI    Studies Reviewed:   EKG Interpretation Date/Time:  Friday March 20 2023 09:35:47 EST Ventricular Rate:  92 PR Interval:  160 QRS Duration:  132 QT Interval:  404 QTC Calculation: 499 R Axis:   86  Text Interpretation: Sinus  rhythm with Premature supraventricular complexes in a pattern of bigeminy Left bundle branch block Confirmed by Tereso Newcomer [phone]) on 03/20/2023 9:42:27 AM    Results -on chart review LABS Potassium: 4.2 (02/11/2023) Creatinine: 1.12 (02/11/2023) ALT: 19 (02/11/2023) GFR: 51.05 (02/11/2023) Total Cholesterol: 154 (01/02/2023) HDL: 73 (01/02/2023) Triglycerides: 126 (01/02/2023) LDL: 59 (01/02/2023) Hemoglobin: 13.7 (10/01/2022)      Risk Assessment/Calculations:         STOP-Bang Score:  5      Physical Exam:   VS:  BP 120/80   Pulse 78   Ht 5\' 7"  (1.702 m)   Wt 256 lb (116.1 kg)   SpO2 98%   BMI 40.10 kg/m    Wt Readings from Last 3 Encounters:  03/20/23 256 lb (116.1 kg)  03/16/23 256 lb (116.1 kg)  02/13/23 259 lb (117.5 kg)    Constitutional:      Appearance: Healthy appearance. Not in distress.  Neck:     Vascular: JVD normal.  Pulmonary:     Breath sounds: Normal breath sounds. No wheezing. No rales.  Cardiovascular:     Normal rate. Irregularly irregular rhythm.     Murmurs: There is no murmur.  Edema:    Peripheral edema absent.  Abdominal:     Palpations: Abdomen is soft.         Assessment and Plan:   Assessment & Plan HFrEF (heart failure with  reduced ejection fraction) (HCC) EF 35-40 by echo in May 2024.  GDMT has been limited due to finances.  We have tried in the past to get Entresto.  She still has forms at home to fill out.  Since her last echocardiogram, she has been placed back on losartan.   -Continue Jardiance 25 mg daily, losartan 50 mg daily, Toprol-XL 100 mg daily, spironolactone 25 mg daily.   -Arrange limited echo recheck EF.  Refer to EP if EF <35.   -Follow-up in 6 months. OSA (obstructive sleep apnea) Recent sleep study positive for sleep apnea.  She is awaiting CPAP titration.  She continues to report episodes of apnea at night. Mixed hyperlipidemia LDL optimal.  Continue Lipitor 80 mg daily, Zetia 10 mg daily. PVC's  (premature ventricular contractions) 2% burden on prior monitoring.  She has a lot more PVCs today with bigeminy. She notes occasional palpitations.  -Increase Metoprolol succinate to 150 mg once daily  -BMET, Magnesium  -Zio XT x 3 days (start in about 4 weeks) to reassess PVC burden -If PVC burden high consider referral to EP Former smoker She quit smoking in May 2024.  She feels her breathing has improved since that time.       Dispo:  Return in about 6 months (around 09/18/2023) for w/ Dr. Lynnette Caffey.  Signed, Tereso Newcomer, PA-C

## 2023-03-20 ENCOUNTER — Encounter: Payer: Self-pay | Admitting: Physician Assistant

## 2023-03-20 ENCOUNTER — Ambulatory Visit (INDEPENDENT_AMBULATORY_CARE_PROVIDER_SITE_OTHER): Payer: Medicare Other

## 2023-03-20 ENCOUNTER — Ambulatory Visit: Payer: Medicare Other | Attending: Physician Assistant | Admitting: Physician Assistant

## 2023-03-20 VITALS — BP 120/80 | HR 78 | Ht 67.0 in | Wt 256.0 lb

## 2023-03-20 DIAGNOSIS — I5020 Unspecified systolic (congestive) heart failure: Secondary | ICD-10-CM

## 2023-03-20 DIAGNOSIS — E782 Mixed hyperlipidemia: Secondary | ICD-10-CM

## 2023-03-20 DIAGNOSIS — Z87891 Personal history of nicotine dependence: Secondary | ICD-10-CM

## 2023-03-20 DIAGNOSIS — G4733 Obstructive sleep apnea (adult) (pediatric): Secondary | ICD-10-CM

## 2023-03-20 DIAGNOSIS — I493 Ventricular premature depolarization: Secondary | ICD-10-CM | POA: Insufficient documentation

## 2023-03-20 MED ORDER — METOPROLOL SUCCINATE ER 100 MG PO TB24: 150.0000 mg | ORAL_TABLET | Freq: Every day | ORAL | 3 refills | Status: DC

## 2023-03-20 NOTE — Patient Instructions (Signed)
Medication Instructions:  Your physician has recommended you make the following change in your medication:   INCREASE the Toprol to 100 mg taking 1 1/2 tablet daily  *If you need a refill on your cardiac medications before your next appointment, please call your pharmacy*   Lab Work: TODAY:  BMET & MAG  If you have labs (blood work) drawn today and your tests are completely normal, you will receive your results only by: MyChart Message (if you have MyChart) OR A paper copy in the mail If you have any lab test that is abnormal or we need to change your treatment, we will call you to review the results.   Testing/Procedures: Your physician has requested that you have an limited echocardiogram. Echocardiography is a painless test that uses sound waves to create images of your heart. It provides your doctor with information about the size and shape of your heart and how well your heart's chambers and valves are working. This procedure takes approximately one hour. There are no restrictions for this procedure. Please do NOT wear cologne, perfume, aftershave, or lotions (deodorant is allowed). Please arrive 15 minutes prior to your appointment time.  Please note: We ask at that you not bring children with you during ultrasound (echo/ vascular) testing. Due to room size and safety concerns, children are not allowed in the ultrasound rooms during exams. Our front office staff cannot provide observation of children in our lobby area while testing is being conducted. An adult accompanying a patient to their appointment will only be allowed in the ultrasound room at the discretion of the ultrasound technician under special circumstances. We apologize for any inconvenience.   ZIO XT- Long Term Monitor Instructions  Your physician has requested you wear a ZIO patch monitor for 3 days.  This is a single patch monitor. [employer] supplies one patch monitor per enrollment. Additional stickers are not  available. Please do not apply patch if you will be having a Nuclear Stress Test,  Echocardiogram, Cardiac CT, MRI, or Chest Xray during the period you would be wearing the  monitor. The patch cannot be worn during these tests. You cannot remove and re-apply the  ZIO XT patch monitor.  Your ZIO patch monitor will be mailed 3 day USPS to your address on file. It may take 3-5 days  to receive your monitor after you have been enrolled.  Once you have received your monitor, please review the enclosed instructions. Your monitor  has already been registered assigning a specific monitor serial # to you.  Billing and Patient Assistance Program Information  We have supplied [employer] with any of your insurance information on file for billing purposes. [employer] offers a sliding scale Patient Assistance Program for patients that do not have  insurance, or whose insurance does not completely cover the cost of the ZIO monitor.  You must apply for the Patient Assistance Program to qualify for this discounted rate.  To apply, please call [employer] at [phone], select option 4, select option 2, ask to apply for  Patient Assistance Program. Meredeth Ide will ask your household income, and how many people  are in your household. They will quote your out-of-pocket cost based on that information.  [employer] will also be able to set up a 21-month, interest-free payment plan if needed.  Applying the monitor   Shave hair from upper left chest.  Hold abrader disc by orange tab. Rub abrader in 40 strokes over the upper left chest as  indicated in your monitor  instructions.  Clean area with 4 enclosed alcohol pads. Let dry.  Apply patch as indicated in monitor instructions. Patch will be placed under collarbone on left  side of chest with arrow pointing upward.  Rub patch adhesive wings for 2 minutes. Remove white label marked "1". Remove the white  label marked "2". Rub patch adhesive wings for 2 additional minutes.   While looking in a mirror, press and release button in center of patch. A small green light will  flash 3-4 times. This will be your only indicator that the monitor has been turned on.  Do not shower for the first 24 hours. You may shower after the first 24 hours.  Press the button if you feel a symptom. You will hear a small click. Record Date, Time and  Symptom in the Patient Logbook.  When you are ready to remove the patch, follow instructions on the last 2 pages of Patient  Logbook. Stick patch monitor onto the last page of Patient Logbook.  Place Patient Logbook in the blue and white box. Use locking tab on box and tape box closed  securely. The blue and white box has prepaid postage on it. Please place it in the mailbox as  soon as possible. Your physician should have your test results approximately 7 days after the  monitor has been mailed back to [facility].  Call [facility] Customer Care at [phone] if you have questions regarding  your ZIO XT patch monitor. Call them immediately if you see an orange light blinking on your  monitor.  If your monitor falls off in less than 4 days, contact our Monitor department at [phone].  If your monitor becomes loose or falls off after 4 days call [employer] at [phone] for  suggestions on securing your monitor   Follow-Up: At [facility], you and your health needs are our priority.  As part of our continuing mission to provide you with exceptional heart care, we have created designated Provider Care Teams.  These Care Teams include your primary Cardiologist (physician) and Advanced Practice Providers (APPs -  Physician Assistants and Nurse Practitioners) who all work together to provide you with the care you need, when you need it.  We recommend signing up for the patient portal called "MyChart".  Sign up information is provided on this After Visit Summary.  MyChart is used to connect with patients for Virtual Visits  (Telemedicine).  Patients are able to view lab/test results, encounter notes, upcoming appointments, etc.  Non-urgent messages can be sent to your provider as well.   To learn more about what you can do with MyChart, go to [email or web address].    Your next appointment:   6 month(s)  Provider:   Orbie Pyo, MD     Other Instructions

## 2023-03-20 NOTE — Progress Notes (Unsigned)
Enrolled patient for a 3 day Zio XT monitor to be mailed to patients home around 04/09/23 for patient to wear second week of Jan  Thukkani to read

## 2023-03-20 NOTE — Assessment & Plan Note (Signed)
EF 35-40 by echo in May 2024.  GDMT has been limited due to finances.  We have tried in the past to get Entresto.  She still has forms at home to fill out.  Since her last echocardiogram, she has been placed back on losartan.   -Continue Jardiance 25 mg daily, losartan 50 mg daily, Toprol-XL 100 mg daily, spironolactone 25 mg daily.   -Arrange limited echo recheck EF.  Refer to EP if EF <35.   -Follow-up in 6 months.

## 2023-03-20 NOTE — Assessment & Plan Note (Signed)
She quit smoking in May 2024.  She feels her breathing has improved since that time.

## 2023-03-20 NOTE — Assessment & Plan Note (Signed)
2% burden on prior monitoring.  She has a lot more PVCs today with bigeminy. She notes occasional palpitations.  -Increase Metoprolol succinate to 150 mg once daily  -BMET, Magnesium  -Zio XT x 3 days (start in about 4 weeks) to reassess PVC burden -If PVC burden high consider referral to EP

## 2023-03-20 NOTE — Assessment & Plan Note (Signed)
LDL optimal.  Continue Lipitor 80 mg daily, Zetia 10 mg daily.

## 2023-03-20 NOTE — Assessment & Plan Note (Signed)
Recent sleep study positive for sleep apnea.  She is awaiting CPAP titration.  She continues to report episodes of apnea at night.

## 2023-03-25 ENCOUNTER — Telehealth: Payer: Self-pay | Admitting: Physician Assistant

## 2023-03-25 NOTE — Telephone Encounter (Signed)
Covering [employer] inbox today. Received notification patient did not go for BMET/Mag as ordered last week by Lorin Picket. Please remind patient. Thank you.

## 2023-03-25 NOTE — Telephone Encounter (Signed)
Left message for patient to callback to remind her to have labs drawn at any [facility] location (we have one in our building on [address] on first floor, [address]).  Also sent MyChart message.

## 2023-03-28 NOTE — Telephone Encounter (Signed)
Received reply from Veda Canning to Scott's inbox that she acknowledged Scott's request for Katrina [name] to call her to schedule CPAP titration.

## 2023-04-19 DIAGNOSIS — M545 Low back pain, unspecified: Secondary | ICD-10-CM | POA: Diagnosis not present

## 2023-04-20 ENCOUNTER — Other Ambulatory Visit: Payer: Self-pay | Admitting: "Endocrinology

## 2023-04-21 DIAGNOSIS — I5020 Unspecified systolic (congestive) heart failure: Secondary | ICD-10-CM

## 2023-04-21 DIAGNOSIS — I493 Ventricular premature depolarization: Secondary | ICD-10-CM | POA: Diagnosis not present

## 2023-04-28 ENCOUNTER — Ambulatory Visit (HOSPITAL_COMMUNITY): Payer: Medicare Other

## 2023-04-28 DIAGNOSIS — I493 Ventricular premature depolarization: Secondary | ICD-10-CM | POA: Diagnosis not present

## 2023-04-28 DIAGNOSIS — I5020 Unspecified systolic (congestive) heart failure: Secondary | ICD-10-CM | POA: Diagnosis not present

## 2023-05-04 ENCOUNTER — Telehealth: Payer: Self-pay

## 2023-05-04 NOTE — Telephone Encounter (Signed)
Left message for patient to call back

## 2023-05-04 NOTE — Telephone Encounter (Signed)
-----   Message from Tereso Newcomer sent at 04/30/2023  9:08 AM EST ----- Result note sent to Katrina [name] via MyChart. See comments below. PLAN:  -Increase Metoprolol succinate to 200 mg once daily -Decrease Losartan to 25 mg once daily  -If pt can, check BP and report systolic less than 95  [name]  Your monitor showed intermediate level burden of premature ventricular contractions (PVCs) at 9.1%.  I reviewed this with Dr. Lynnette Caffey.  We would like to continue to try to increase your dose of metoprolol if possible.  I will increase metoprolol succinate to 200 mg daily.  Let's also decrease Losartan to 25 mg once daily to keep your blood pressure from running low. We will see what your echocardiogram shows in a couple of weeks.  If your ejection fraction (heart function) remains low, I will have you see our electrophysiology cardiologist (rhythm heart doctor) to see if there is anything else that we need to do about your PVCs. Tereso Newcomer, PA-C    04/30/2023 9:03 AM

## 2023-05-05 MED ORDER — LOSARTAN POTASSIUM 25 MG PO TABS: 25.0000 mg | ORAL_TABLET | Freq: Every day | ORAL | 3 refills | Status: AC

## 2023-05-05 MED ORDER — METOPROLOL SUCCINATE ER 100 MG PO TB24: 200.0000 mg | ORAL_TABLET | Freq: Every day | ORAL | 3 refills | Status: DC

## 2023-05-05 NOTE — Telephone Encounter (Signed)
Pt viewed results / recommendations via mychart.

## 2023-05-05 NOTE — Telephone Encounter (Signed)
-----   Message from Nurse Corky Crafts sent at 05/05/2023 12:15 PM EST -----  ----- Message ----- From: Kennon Rounds Sent: 04/30/2023   9:08 AM EST To: [address] Triage  Result note sent to [name] via MyChart. See comments below. PLAN:  -Increase Metoprolol succinate to 200 mg once daily -Decrease Losartan to 25 mg once daily  -If pt can, check BP and report systolic less than 95  Ms. Whiters  Your monitor showed intermediate level burden of premature ventricular contractions (PVCs) at 9.1%.  I reviewed this with Dr. Lynnette Caffey.  We would like to continue to try to increase your dose of metoprolol if possible.  I will increase metoprolol succinate to 200 mg daily.  Let's also decrease Losartan to 25 mg once daily to keep your blood pressure from running low. We will see what your echocardiogram shows in a couple of weeks.  If your ejection fraction (heart function) remains low, I will have you see our electrophysiology cardiologist (rhythm heart doctor) to see if there is anything else that we need to do about your PVCs. Tereso Newcomer, PA-C    04/30/2023 9:03 AM

## 2023-05-06 ENCOUNTER — Other Ambulatory Visit: Payer: Medicare Other

## 2023-05-11 ENCOUNTER — Ambulatory Visit: Payer: Medicare Other | Admitting: "Endocrinology

## 2023-05-12 ENCOUNTER — Other Ambulatory Visit: Payer: Medicare Other

## 2023-05-12 DIAGNOSIS — Z6839 Body mass index (BMI) 39.0-39.9, adult: Secondary | ICD-10-CM | POA: Diagnosis not present

## 2023-05-12 DIAGNOSIS — E66812 Obesity, class 2: Secondary | ICD-10-CM | POA: Diagnosis not present

## 2023-05-13 ENCOUNTER — Encounter: Payer: Self-pay | Admitting: Family Medicine

## 2023-05-13 LAB — T4, FREE: Free T4: 1.09 ng/dL (ref 0.82–1.77)

## 2023-05-13 LAB — TSH: TSH: 1.47 u[IU]/mL (ref 0.450–4.500)

## 2023-05-14 ENCOUNTER — Encounter: Payer: Self-pay | Admitting: Family Medicine

## 2023-05-14 ENCOUNTER — Ambulatory Visit (HOSPITAL_COMMUNITY): Payer: Medicare Other

## 2023-05-18 ENCOUNTER — Ambulatory Visit (INDEPENDENT_AMBULATORY_CARE_PROVIDER_SITE_OTHER): Payer: Medicare Other | Admitting: Family Medicine

## 2023-05-18 ENCOUNTER — Encounter: Payer: Self-pay | Admitting: Family Medicine

## 2023-05-18 VITALS — BP 108/74 | HR 64 | Ht 67.0 in | Wt 257.0 lb

## 2023-05-18 DIAGNOSIS — Z6839 Body mass index (BMI) 39.0-39.9, adult: Secondary | ICD-10-CM | POA: Diagnosis not present

## 2023-05-18 DIAGNOSIS — E66812 Obesity, class 2: Secondary | ICD-10-CM | POA: Diagnosis not present

## 2023-05-18 DIAGNOSIS — S0501XA Injury of conjunctiva and corneal abrasion without foreign body, right eye, initial encounter: Secondary | ICD-10-CM | POA: Diagnosis not present

## 2023-05-18 DIAGNOSIS — H53141 Visual discomfort, right eye: Secondary | ICD-10-CM

## 2023-05-18 MED ORDER — ERYTHROMYCIN 5 MG/GM OP OINT
1.0000 | TOPICAL_OINTMENT | Freq: Three times a day (TID) | OPHTHALMIC | 1 refills | Status: AC
Start: 2023-05-18 — End: 2023-05-23

## 2023-05-18 MED ORDER — DICLOFENAC SODIUM 0.1 % OP SOLN: 1.0000 [drp] | Freq: Four times a day (QID) | OPHTHALMIC | 0 refills | Status: DC

## 2023-05-18 NOTE — Assessment & Plan Note (Signed)
 Wegovy  previously approved but is too expensive.  Continue with nutrition interventions prioritizing fiber and protein.  Provided information on The Obesity Guide with Matthea Rentea podcast for additional information and support.  Also encourage patient to continue cutting back on alcohol  use as she has been.

## 2023-05-18 NOTE — Progress Notes (Signed)
 Established Patient Office Visit  Subjective   Patient ID: Katrina [name], female    DOB: Apr 03, 1956  Age: 68 y.o. MRN: [id number]  Chief Complaint  Patient presents with   Follow-up    HPI Katrina [name] is a 68 y.o. female presenting today for follow up of weight management.  Previously discussed starting Wegovy  for weight management and cardiovascular protection due to history of hypertension and heart failure. She was not able to start the Wegovy  because even with insurance, she would have to pay $100 out-of-pocket every month.  Instead, she has been prioritizing lean meat, fruits, and vegetables.  She also notes today that she woke up with a foreign body sensation in her right eye.  She has tried flushing the eye with no improvement.  Her grandson did hit her in the eye with compression yesterday.  She did not have any pain initially, only upon waking this morning.  She endorses pain, tearing, and photophobia.  Outpatient Medications Prior to Visit  Medication Sig   acetaminophen  (TYLENOL ) 500 MG tablet Take 1,000 mg by mouth every 6 (six) hours as needed for moderate pain.   acetaminophen -codeine  (TYLENOL  #3) 300-30 MG tablet Take 1 tablet by mouth every 12 (twelve) hours as needed for moderate pain.   albuterol  (VENTOLIN  HFA) 108 (90 Base) MCG/ACT inhaler Inhale 2 puffs into the lungs every 6 (six) hours as needed for wheezing or shortness of breath.   aspirin  EC 81 MG tablet Take 1 tablet (81 mg total) by mouth daily. Swallow whole.   atorvastatin  (LIPITOR) 80 MG tablet Take 1 tablet by mouth once daily   Budeson-Glycopyrrol-Formoterol (BREZTRI  AEROSPHERE) 160-9-4.8 MCG/ACT AERO Inhale 2 puffs into the lungs in the morning and at bedtime.   diclofenac  Sodium (VOLTAREN  ARTHRITIS PAIN) 1 % GEL Apply 2 g topically 4 (four) times daily as needed (pain).   empagliflozin  (JARDIANCE ) 25 MG TABS tablet Take 1 tablet (25 mg total) by mouth daily before breakfast.   fluticasone  (FLONASE )  50 MCG/ACT nasal spray Place 2 sprays into both nostrils daily.   losartan  (COZAAR ) 25 MG tablet Take 1 tablet (25 mg total) by mouth daily.   metoprolol  succinate (TOPROL -XL) 100 MG 24 hr tablet Take 2 tablets (200 mg total) by mouth daily. Take with or immediately following a meal.   ondansetron  (ZOFRAN ) 4 MG tablet Take 1 tablet (4 mg total) by mouth every 8 (eight) hours as needed for nausea or vomiting.   spironolactone  (ALDACTONE ) 25 MG tablet Take 1 tablet (25 mg total) by mouth at bedtime.   Vitamin D , Ergocalciferol , (DRISDOL ) 1.25 MG (50000 UNIT) CAPS capsule Take 1 capsule (50,000 Units total) by mouth every 7 (seven) days.   DULoxetine  (CYMBALTA ) 20 MG capsule Take 1 capsule (20 mg total) by mouth daily.   ezetimibe  (ZETIA ) 10 MG tablet Take 1 tablet (10 mg total) by mouth daily.   No facility-administered medications prior to visit.    ROS Negative unless otherwise noted in HPI   Objective:     BP 108/74   Pulse 64   Ht 5\' 7"  (1.702 m)   Wt 257 lb (116.6 kg)   SpO2 91%   BMI 40.25 kg/m   Physical Exam Constitutional:      General: She is not in acute distress.    Appearance: Normal appearance.  HENT:     Head: Normocephalic and atraumatic.  Eyes:     General: Lids are normal.        Right  eye: No foreign body or discharge.        Left eye: No foreign body or discharge.     Pupils: Pupils are equal, round, and reactive to light.  Cardiovascular:     Rate and Rhythm: Normal rate and regular rhythm.     Heart sounds: No murmur heard.    No friction rub. No gallop.  Pulmonary:     Effort: Pulmonary effort is normal. No respiratory distress.     Breath sounds: No wheezing, rhonchi or rales.  Skin:    General: Skin is warm and dry.  Neurological:     Mental Status: She is alert and oriented to person, place, and time.      Assessment & Plan:  Class 2 severe obesity due to excess calories with serious comorbidity and body mass index (BMI) of 39.0 to 39.9 in  adult [facility]) Assessment & Plan: Wegovy  previously approved but is too expensive.  Continue with nutrition interventions prioritizing fiber and protein.  Provided information on The Obesity Guide with Matthea Rentea podcast for additional information and support.  Also encourage patient to continue cutting back on alcohol  use as she has been.   Abrasion of right cornea, initial encounter -     Erythromycin ; Place 1 Application into the right eye 3 (three) times daily for 5 days. Apply 1 inch ribbon to affected eye TID for 5 days.  Dispense: 3.5 g; Refill: 1 -     Diclofenac  Sodium; Place 1 drop into the right eye 4 (four) times daily. For pain.  Dispense: 5 mL; Refill: 0  Photophobia of right eye -     Diclofenac  Sodium; Place 1 drop into the right eye 4 (four) times daily. For pain.  Dispense: 5 mL; Refill: 0  Patient does not wear contacts.  Providing prescription for erythromycin  and diclofenac  ophthalmic solution to treat likely corneal abrasion.  If symptoms do not improve within 24-48 hours, recommend follow-up with ophthalmologist.  TSH and free T4 within normal limits.  Return in about 3 months (around 08/15/2023) for follow-up for HTN, HLD, weight management, fasting labs 1 week before.    Noreene Bearded, PA

## 2023-05-18 NOTE — Patient Instructions (Signed)
 PODCAST: -The Obesity Guide with Matthea Rentea  -It can also be helpful to start with an online calculator to find your total daily energy expenditure (TDEE). Try to be in a 200-300 calorie deficit below the TDEE number.  PREVENTATIVE CARE: I encourage you to consider the following preventative care options that are recommended for you.  These are covered by your insurance company because they are for the prevention of issues that can be dangerous to your health. -Mammogram: Recommended every 2 years for women aged 12 to 60 years. Among all [place]  women, breast cancer is the second most common cancer and the second most common cause of cancer death.  Screening for breast cancer with mammogram allows for early detection and early treatment, decreasing. -Bone density DEXA scan: Recommended screening for osteoporosis to prevent osteoporotic fractures in women 65 years or older. Osteoporosis is a skeletal disorder characterized by decreased bone mass leading to increased bone fragility and fracture risk. Osteoporotic fractures are associated with psychological distress, subsequent fractures, loss of independence, reduced ability to perform activities of daily living, and death. About 1 in 4 women 64  and older has osteoporosis. -Shingles vaccine: 2 dose series recommended for all adults starting at age 63. -Pneumonia (PCV20) vaccine: Recommended for all adults starting at age 72 to reduce the likelihood of getting pneumonia, having complications, or needing to go to the hospital.  It may be recommended prior to age 64 for people with a weaker immune system due to medications, conditions like diabetes, lung disease, tobacco use, etc.

## 2023-05-20 DIAGNOSIS — M545 Low back pain, unspecified: Secondary | ICD-10-CM | POA: Diagnosis not present

## 2023-06-04 ENCOUNTER — Other Ambulatory Visit (INDEPENDENT_AMBULATORY_CARE_PROVIDER_SITE_OTHER): Payer: Self-pay

## 2023-06-04 ENCOUNTER — Ambulatory Visit: Payer: Medicare Other | Admitting: Physician Assistant

## 2023-06-04 ENCOUNTER — Encounter: Payer: Self-pay | Admitting: Physician Assistant

## 2023-06-04 DIAGNOSIS — M25511 Pain in right shoulder: Secondary | ICD-10-CM

## 2023-06-04 NOTE — Progress Notes (Signed)
 HPI: [name] comes in today due to right shoulder pain.  She states that she has decreased range of motion of the arm and is unable to lift the arm secondary to pain.  History of right C5-C6 foraminotomy 04/02/2020 by Dr. Ferdie Ping.  States she is having no real radicular symptoms but does have still some numbness tingling in the right hand that she has had since undergoing surgery.  She has had no injury to the right shoulder states that she went to bed 1 night and woke up the next morning and was unable to move her arm.  She states it feels stuck like it might be out of place.  This been ongoing for 3 to 4 weeks.  She states right shoulder movements difficult and causes sharp pain.  She is nondiabetic.  Denies any fevers chills.  Notes that the shoulder pain does awaken her.  Review of systems: See HPI otherwise negative  Physical exam: General Well-developed well-nourished pleasant female in no acute distress Psych: Alert and oriented x 3 Respirations: Unlabored.  On room air. Cervical spine good range of motion without pain. Bilateral shoulders she has tenderness in the trapezius region bilaterally right greater than left.  Full range of motion of the left shoulder without pain.  Right shoulder difficult to examine secondary to pain.  Strength testing with external and internal rotation left shoulder is 5 out of 5.  Again hard to assess on the right secondary to pain.  Empty can test negative on the left unable to examine secondary to pain on the right.  Impingement testing positive on the right negative on the left.  External and internal rotation of the right shoulder causes extreme pain but produces fluid motion.  Full overhead activity left shoulder without pain.  Right shoulder passively with significant pain I can bring it under 70 degrees of overhead motion.  Radiographs: Right shoulder 2 views shoulder is well located.  Downsloping acromion.  Mild to moderate AC joint changes.  Glenohumeral  joint overall well-preserved.  No acute fractures or acute findings.    Impression acute right shoulder pain  Plan: For diagnostic and hopefully therapeutic purposes  right shoulder subacromial injections given patient tolerates well.  Postinjection she has slightly decreased pain at the shoulder with slight increase motion.  Will see her back in just 2 weeks see how she is doing overall.  She was shown forward flexion exercises, wall crawls, Codman and pendulum exercises to perform on her own.  Questions were encouraged and answered at length

## 2023-06-11 ENCOUNTER — Ambulatory Visit (HOSPITAL_COMMUNITY): Payer: Medicare Other | Attending: Internal Medicine

## 2023-06-11 DIAGNOSIS — I5020 Unspecified systolic (congestive) heart failure: Secondary | ICD-10-CM | POA: Insufficient documentation

## 2023-06-11 DIAGNOSIS — E782 Mixed hyperlipidemia: Secondary | ICD-10-CM | POA: Insufficient documentation

## 2023-06-11 DIAGNOSIS — G4733 Obstructive sleep apnea (adult) (pediatric): Secondary | ICD-10-CM | POA: Diagnosis not present

## 2023-06-11 DIAGNOSIS — I493 Ventricular premature depolarization: Secondary | ICD-10-CM | POA: Diagnosis not present

## 2023-06-11 DIAGNOSIS — Z87891 Personal history of nicotine dependence: Secondary | ICD-10-CM | POA: Diagnosis not present

## 2023-06-11 LAB — ECHOCARDIOGRAM LIMITED
Area-P 1/2: 2.78 cm2
S' Lateral: 3.7 cm

## 2023-06-11 MED ORDER — PERFLUTREN LIPID MICROSPHERE
1.0000 mL | INTRAVENOUS | Status: AC | PRN
  Administered 2023-06-11: 2 mL via INTRAVENOUS

## 2023-06-12 ENCOUNTER — Other Ambulatory Visit: Payer: Self-pay | Admitting: *Deleted

## 2023-06-12 DIAGNOSIS — I428 Other cardiomyopathies: Secondary | ICD-10-CM

## 2023-06-12 DIAGNOSIS — I447 Left bundle-branch block, unspecified: Secondary | ICD-10-CM

## 2023-06-17 DIAGNOSIS — M545 Low back pain, unspecified: Secondary | ICD-10-CM | POA: Diagnosis not present

## 2023-06-18 ENCOUNTER — Ambulatory Visit: Payer: Medicare Other | Admitting: Physician Assistant

## 2023-06-28 ENCOUNTER — Other Ambulatory Visit: Payer: Self-pay | Admitting: Internal Medicine

## 2023-07-01 ENCOUNTER — Ambulatory Visit: Admitting: Physician Assistant

## 2023-07-01 DIAGNOSIS — M25511 Pain in right shoulder: Secondary | ICD-10-CM

## 2023-07-01 NOTE — Progress Notes (Signed)
 HPI: Katrina [name] returns today for follow-up of her right shoulder status post subacromial injection 06/04/2023.  She states that her right shoulder overall has improved at least 50%.  States her pain is 3 out of 10 at worst.  She is doing some of the home exercises.  She feels that her range of motion and strength are improved.  She notes swelling reaching overhead that coming down she has a catching like sensation in the shoulder.  Physical exam: General Well-developed well-nourished female who ambulates without any assistive device. Bilateral shoulders: 5 out of 5 strength with external and internal rotation against resistance.  Liftoff test is negative bilaterally.  Empty can test is negative bilaterally.  She has tenderness over the right AC joint only.  Abduction causes pain right AC joint region only.  Impression: Right shoulder pain Right AC joint pain  Plan: Will send her for an intra-articular injection right AC joint with Dr. Shon Baton see what type of response she has.  This both for diagnostic and hopefully therapeutic purposes.  She is given handouts and Thera-Band for shoulder exercises.  She will follow-up with [place] in 3 months to see how she is doing overall.  Questions encouraged and answered at length

## 2023-07-09 ENCOUNTER — Ambulatory Visit (INDEPENDENT_AMBULATORY_CARE_PROVIDER_SITE_OTHER): Admitting: Sports Medicine

## 2023-07-09 ENCOUNTER — Encounter: Payer: Self-pay | Admitting: Sports Medicine

## 2023-07-09 ENCOUNTER — Other Ambulatory Visit: Payer: Self-pay

## 2023-07-09 DIAGNOSIS — M25511 Pain in right shoulder: Secondary | ICD-10-CM

## 2023-07-09 MED ORDER — METHYLPREDNISOLONE ACETATE 40 MG/ML IJ SUSP
40.0000 mg | INTRAMUSCULAR | Status: AC | PRN
  Administered 2023-07-09: 40 mg via INTRA_ARTICULAR

## 2023-07-09 MED ORDER — LIDOCAINE HCL 1 % IJ SOLN
0.5000 mL | INTRAMUSCULAR | Status: AC | PRN
  Administered 2023-07-09: .5 mL

## 2023-07-09 NOTE — Progress Notes (Signed)
   Procedure Note  Patient: Katrina [name]             Date of Birth: [birth year]           MRN: [id number]             Visit Date: 07/09/2023  Procedures: Visit Diagnoses:  1. Arthralgia of right acromioclavicular joint   2. Right shoulder pain, unspecified chronicity    Medium Joint Inj: R acromioclavicular on 07/09/2023 9:24 AM Indications: pain Details: 22 G 1.5 in needle, ultrasound-guided anterior approach Medications: 0.5 mL lidocaine 1 %; 40 mg methylPREDNISolone acetate 40 MG/ML  US-guided AC Joint injection, Right shoulder After discussion on risks/benefits/indications, informed verbal consent was obtained. A timeout was then performed. The patient was seated in examination room. The area overlying the [facility] joint of the shoulder was prepped with Betadine and alcohol swab then utilizing ultrasound guidance, patient's AC joint was injected using a 25G, 1.5" needle with 0.5:1.75mL lidocaine:depomedrol of injectate via an out-of-plane, walk-down approach. Visualization of injectate flow was noted under ultrasound guidance. Patient tolerated the procedure well without immediate complications.   Procedure, treatment alternatives, risks and benefits explained, specific risks discussed. Consent was given by the patient. Immediately prior to procedure a time out was called to verify the correct patient, procedure, equipment, support staff and site/side marked as required. Patient was prepped and draped in the usual sterile fashion.     - f/u with Victory Dakin as indicated for shoulder - post-injection protocol discussed  Madelyn Brunner, DO Primary Care Sports Medicine Physician  [facility] - Orthopedics  This note was dictated using Dragon naturally speaking software and may contain errors in syntax, spelling, or content which have not been identified prior to signing this note.

## 2023-07-14 NOTE — Progress Notes (Unsigned)
 Electrophysiology Office Note:    Date:  07/15/2023   ID:  Katrina [name], DOB 1955-10-03, MRN [id number]  PCP:  Melida Quitter, PA   [facility] Providers Cardiologist:  Orbie Pyo, MD     Referring MD: Beatrice Lecher, PA-C   History of Present Illness:    Katrina [name] is a 68 y.o. 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      Discussed the use of AI scribe software for clinical note transcription with the patient, who gave verbal consent to proceed.  History of Present Illness Katrina [name] is a 68 year old female with premature ventricular contractions (PVCs) who presents with concerns about her heart rhythm. She is accompanied by her daughter. She was referred by Tereso Newcomer for evaluation of her PVCs.  She is experiencing premature ventricular contractions (PVCs), which were noted on her EKG and a monitor she wore. She describes the sensation as a 'fluttering' or 'butterfly' feeling in her chest, and more recently, as a 'little drums going and it'll stop.' These sensations have been more frequent lately, although they are not constant. She has been living with these symptoms for years, but they have become more noticeable recently. Her PVC burden was noted to be 9% on a monitor placed in January, and she had an echo last month showing unchanged heart muscle strength. She recalls being told that the bottom of her heart was weaker, but she is unsure how this relates to her PVCs.  She is currently taking metoprolol succinate 150 mg for heart dysfunction. She mentions that the medication sometimes helps with the PVCs, but not always. Her heart function has been monitored over the years, with echos showing a consistent ejection fraction of 35-40%.  Stress, anxiety, and possibly sleep issues might be contributing to her symptoms, especially given the recent loss of her  father and the birth of a new grandchild. She has been under significant stress over the past year, which she believes may be affecting her heart rhythm. She has been advised in the past to consider CPAP for sleep apnea, which she has not yet started. She is also considering lifestyle changes, including improving her diet, especially since her grandson is starting to eat table food.         Today, she reports that she is doing well. She has occasional palpitations, but they are not particularly bothersome to her.  EKGs/Labs/Other Studies Reviewed Today:     Echocardiogram:  TTE June 11, 2023 EF 35 to 40%.  septal dyssynchrony due to left bundle branch block.  LV is mildly dilated.  Grade 1 diastolic dysfunction.  Left atrium mildly dilated.  Trivial mitral regurgitation.   Monitors:  5 day monitor January 2025-- my interpretation Sinus rhythm heart rate 50 to 123 bpm, average 77 bpm 9.1% PVC burden No symptom episodes.    Cardiac catherization  Coronary angiogram January 2023 Normal right dominant coronary circulation  EKG:   EKG Interpretation Date/Time:  Wednesday July 15 2023 08:58:12 EDT Ventricular Rate:  81 PR Interval:  170 QRS Duration:  140 QT Interval:  412 QTC Calculation: 478 R Axis:   253  Text Interpretation: Normal sinus rhythm Non-specific intra-ventricular conduction block Possible Lateral infarct , age undetermined When compared with ECG of 20-Mar-2023 09:35, PVCs and longer present Confirmed by York Pellant [phone]) on 07/15/2023 9:04:43 AM     Physical  Exam:    VS:  BP 130/60   Pulse 81   Ht 5\' 7"  (1.702 m)   Wt 266 lb (120.7 kg)   SpO2 96%   BMI 41.66 kg/m     Wt Readings from Last 3 Encounters:  07/15/23 266 lb (120.7 kg)  05/18/23 257 lb (116.6 kg)  03/20/23 256 lb (116.1 kg)     GEN: Well nourished, well developed in no acute distress CARDIAC: RRR, no murmurs, rubs, gallops RESPIRATORY:  Normal work of  breathing MUSCULOSKELETAL: no edema    ASSESSMENT & PLAN:     Frequent PVCs Burden on January monitor was less than 10% At this burden, PVCs are not likely to be the cause of her cardiomyopathy; and her EF has remained consistent despite fluctuation in her PVC burden I do not think she is a good candidate for ablation or antiarrhythmic drug at this time Continue metoprolol XL 150mg  I recommended lifestyle modification -- stress maintenance, weight loss, and encouraged her to follow through with getting her CPAP for OSA  CHF with reduced ejection fraction LVEF 35 to 40% on most recent echocardiogram GDMT limited by finances Continue losartan 50 mg daily, metoprolol XL 100 mg daily, Jardiance 25 mg daily, spironolactone 25 mg daily Would consider CRT device if EF decreases further  Left bundle branch block Continue to monitor If EF declines to < 35%, would consider placement of a CRT device   I will refer her back to general cardiology clinic. I will be happy to arrange expedited EP follow-up if her EF declines or the PVCs become more concerning -- ie symptoms worsen to the point that antiarrhythmic drug is warranted, of PVC burden increases to > 20%.   Signed, Maurice Small, MD  07/15/2023 9:05 AM    [facility]

## 2023-07-15 ENCOUNTER — Ambulatory Visit: Attending: Cardiovascular Disease | Admitting: Cardiovascular Disease

## 2023-07-15 ENCOUNTER — Encounter: Payer: Self-pay | Admitting: Cardiovascular Disease

## 2023-07-15 VITALS — BP 130/60 | HR 81 | Ht 67.0 in | Wt 266.0 lb

## 2023-07-15 DIAGNOSIS — I493 Ventricular premature depolarization: Secondary | ICD-10-CM | POA: Diagnosis not present

## 2023-07-15 DIAGNOSIS — I447 Left bundle-branch block, unspecified: Secondary | ICD-10-CM | POA: Diagnosis not present

## 2023-07-15 NOTE — Patient Instructions (Signed)
 Medication Instructions:  Your physician recommends that you continue on your current medications as directed. Please refer to the Current Medication list given to you today. *If you need a refill on your cardiac medications before your next appointment, please call your pharmacy*  Follow-Up: At [facility], you and your health needs are our priority.  As part of our continuing mission to provide you with exceptional heart care, our providers are all part of one team.  This team includes your primary Cardiologist (physician) and Advanced Practice Providers or APPs (Physician Assistants and Nurse Practitioners) who all work together to provide you with the care you need, when you need it.  Your next appointment:   09/10/23  Provider:   Orbie Pyo, MD       1st Floor: - Lobby - Registration  - Pharmacy  - Lab - Cafe  2nd Floor: - PV Lab - Diagnostic Testing (echo, CT, nuclear med)  3rd Floor: - Vacant  4th Floor: - TCTS (cardiothoracic surgery) - AFib Clinic - Structural Heart Clinic - Vascular Surgery  - Vascular Ultrasound  5th Floor: - HeartCare Cardiology (general and EP) - Clinical Pharmacy for coumadin, hypertension, lipid, weight-loss medications, and med management appointments    Valet parking services will be available as well.

## 2023-07-16 ENCOUNTER — Telehealth: Payer: Self-pay

## 2023-07-16 NOTE — Telephone Encounter (Signed)
 No PA is required for CPAP Titration per [employer], order has been transferred to [facility] Lab que to be scheduled.

## 2023-07-28 ENCOUNTER — Other Ambulatory Visit: Payer: Self-pay | Admitting: Acute Care

## 2023-07-28 DIAGNOSIS — Z87891 Personal history of nicotine dependence: Secondary | ICD-10-CM

## 2023-07-28 DIAGNOSIS — F17210 Nicotine dependence, cigarettes, uncomplicated: Secondary | ICD-10-CM

## 2023-08-02 ENCOUNTER — Other Ambulatory Visit: Payer: Self-pay | Admitting: Internal Medicine

## 2023-08-05 ENCOUNTER — Ambulatory Visit
Admission: RE | Admit: 2023-08-05 | Discharge: 2023-08-05 | Disposition: A | Discharge status: Home / Self Care | Source: Ambulatory Visit | Attending: Family Medicine | Admitting: Family Medicine

## 2023-08-05 DIAGNOSIS — Z87891 Personal history of nicotine dependence: Secondary | ICD-10-CM | POA: Insufficient documentation

## 2023-08-05 DIAGNOSIS — F17210 Nicotine dependence, cigarettes, uncomplicated: Secondary | ICD-10-CM | POA: Diagnosis not present

## 2023-08-06 ENCOUNTER — Ambulatory Visit: Payer: Medicare Other

## 2023-08-06 DIAGNOSIS — Z0000 Encounter for general adult medical examination without abnormal findings: Secondary | ICD-10-CM | POA: Diagnosis not present

## 2023-08-06 NOTE — Patient Instructions (Signed)
 Katrina [name] , Thank you for taking time to come for your Medicare Wellness Visit. I appreciate your ongoing commitment to your health goals. Please review the following plan we discussed and let me know if I can assist you in the future.   Referrals/Orders/Follow-Ups/Clinician Recommendations: none  This is a list of the screening recommended for you and due dates:  Health Maintenance  Topic Date Due   Hepatitis C Screening  Never done   Pneumonia Vaccine (1 of 2 - PCV) Never done   Zoster (Shingles) Vaccine (1 of 2) Never done   Mammogram  02/13/2017   COVID-19 Vaccine (3 - Moderna risk series) 05/24/2020   DEXA scan (bone density measurement)  Never done   Screening for Lung Cancer  07/18/2023   Flu Shot  11/06/2023   DTaP/Tdap/Td vaccine (2 - Td or Tdap) 04/21/2024   Medicare Annual Wellness Visit  08/05/2024   Colon Cancer Screening  11/04/2027   HPV Vaccine  Aged Out   Meningitis B Vaccine  Aged Out    Advanced directives: (ACP Link)Information on Advanced Care Planning can be found at [employer] [facility] Advance Health Care Directives Advance Health Care Directives. [email or web address]   Next Medicare Annual Wellness Visit scheduled for next year: Yes  insert Preventive Care attachment Insert FALL PREVENTION attachment if needed

## 2023-08-06 NOTE — Progress Notes (Signed)
 Subjective:   Katrina [name] is a 68 y.o. who presents for a Medicare Wellness preventive visit.  Visit Complete: Virtual I connected with  Katrina [name] on 08/06/23 by a audio enabled telemedicine application and verified that I am speaking with the correct person using two identifiers.  Patient Location: Home  Provider Location: Home Office  I discussed the limitations of evaluation and management by telemedicine. The patient expressed understanding and agreed to proceed.  Vital Signs: Because this visit was a virtual/telehealth visit, some criteria may be missing or patient reported. Any vitals not documented were not able to be obtained and vitals that have been documented are patient reported.  VideoError- Librarian, academic were attempted between this provider and patient, however failed, due to patient having technical difficulties OR patient did not have access to video capability.  We continued and completed visit with audio only.   Persons Participating in Visit: Patient.  AWV Questionnaire: No: Patient Medicare AWV questionnaire was not completed prior to this visit.  Cardiac Risk Factors include: advanced age (>73men, >45 women);dyslipidemia;hypertension     Objective:    Today's Vitals   There is no height or weight on file to calculate BMI.     08/06/2023    1:53 PM 09/02/2022    7:01 AM 07/31/2022    1:18 PM 06/01/2022    2:56 PM 05/31/2022    4:44 PM 03/05/2022    1:08 PM 04/26/2021    6:25 AM  Advanced Directives  Does Patient Have a Medical Advance Directive? No No No  No No No  Would patient like information on creating a medical advance directive?  No - Patient declined No - Patient declined No - Patient declined  No - Patient declined No - Patient declined    Current Medications (verified) Outpatient Encounter Medications as of 08/06/2023  Medication Sig   acetaminophen  (TYLENOL ) 500 MG tablet Take 1,000 mg by mouth every 6  (six) hours as needed for moderate pain.   albuterol  (VENTOLIN  HFA) 108 (90 Base) MCG/ACT inhaler Inhale 2 puffs into the lungs every 6 (six) hours as needed for wheezing or shortness of breath.   aspirin  EC 81 MG tablet Take 1 tablet (81 mg total) by mouth daily. Swallow whole.   atorvastatin  (LIPITOR) 80 MG tablet Take 1 tablet by mouth once daily   Budeson-Glycopyrrol-Formoterol (BREZTRI  AEROSPHERE) 160-9-4.8 MCG/ACT AERO Inhale 2 puffs into the lungs in the morning and at bedtime.   diclofenac  Sodium (VOLTAREN  ARTHRITIS PAIN) 1 % GEL Apply 2 g topically 4 (four) times daily as needed (pain).   spironolactone  (ALDACTONE ) 25 MG tablet TAKE 1 TABLET BY MOUTH AT BEDTIME   DULoxetine  (CYMBALTA ) 20 MG capsule Take 1 capsule (20 mg total) by mouth daily.   empagliflozin  (JARDIANCE ) 25 MG TABS tablet Take 1 tablet (25 mg total) by mouth daily before breakfast. (Patient not taking: Reported on 07/15/2023)   ezetimibe  (ZETIA ) 10 MG tablet Take 1 tablet (10 mg total) by mouth daily.   fluticasone  (FLONASE ) 50 MCG/ACT nasal spray Place 2 sprays into both nostrils daily. (Patient not taking: Reported on 07/15/2023)   losartan  (COZAAR ) 25 MG tablet Take 1 tablet (25 mg total) by mouth daily.   metoprolol  succinate (TOPROL -XL) 100 MG 24 hr tablet Take 2 tablets (200 mg total) by mouth daily. Take with or immediately following a meal.   ondansetron  (ZOFRAN ) 4 MG tablet Take 1 tablet (4 mg total) by mouth every 8 (eight) hours as  needed for nausea or vomiting. (Patient not taking: Reported on 07/15/2023)   Vitamin D , Ergocalciferol , (DRISDOL ) 1.25 MG (50000 UNIT) CAPS capsule Take 1 capsule (50,000 Units total) by mouth every 7 (seven) days. (Patient not taking: Reported on 08/06/2023)   [DISCONTINUED] clonazePAM  (KLONOPIN ) 0.5 MG tablet Take 1 tablet (0.5 mg total) by mouth 2 (two) times daily as needed for anxiety.   No facility-administered encounter medications on file as of 08/06/2023.    Allergies  (verified) Codeine    History: Past Medical History:  Diagnosis Date   Alcohol  abuse    Anxiety    Arthritis    knees, hips, elbows    Asthma    hx of asthma 3/12- hospitalized    Chronic systolic heart failure (HCC)    COPD (chronic obstructive pulmonary disease) (HCC)    Depression    GERD (gastroesophageal reflux disease)    PRIOR TO HAVING GALLBLADDER REMOVED   Headache    from her neck   Hypertension    Left bundle branch block 06/28/2010   Lung infection 2015   Tobacco abuse    Past Surgical History:  Procedure Laterality Date   BACK SURGERY     x2   CARPAL TUNNEL RELEASE     left    CESAREAN SECTION     x 2   CHOLECYSTECTOMY     COLONOSCOPY  10/2017   DILATATION & CURETTAGE/HYSTEROSCOPY WITH MYOSURE N/A 12/10/2017   Procedure: DILATATION & CURETTAGE/HYSTEROSCOPY WITH MYOSURE;  Surgeon: Arlee Lace, MD;  Location: [facility];  Service: Gynecology;  Laterality: N/A;  POSSIBLE MYOMECTOMY VS POLYPECTOMY WITH MYOSURE   DILATION AND CURETTAGE OF UTERUS     ELBOW SURGERY     JOINT REPLACEMENT     KNEE ARTHROSCOPY     bilateral x 2    KNEE ARTHROSCOPY Left 11/03/2013   Procedure: LEFT KNEE ARTHROSCOPY WITH DEBRIDEMENT, PARTIAL SYNOVECTOMY;  Surgeon: Arnie Lao, MD;  Location: [facility];  Service: Orthopedics;  Laterality: Left;   LAPAROSCOPY     left elbow surgery      OTHER SURGICAL HISTORY     arthroswcopic surgery right knee    OTHER SURGICAL HISTORY     arthroscopic surgery left knee x 2    OTHER SURGICAL HISTORY     bunionectomy right foot    OTHER SURGICAL HISTORY     C Section x 2    OTHER SURGICAL HISTORY     carpal tunnel on left    POSTERIOR CERVICAL FUSION/FORAMINOTOMY N/A 04/02/2020   Procedure: RIGHT C5-6 FORAMINOTOMY;  Surgeon: Alphonso Jean, MD;  Location: [facility];  Service: Orthopedics;  Laterality: N/A;   RADIOLOGY WITH ANESTHESIA N/A 07/28/2019   Procedure: MRI WITH ANESTHESIA OF NECK SOFT TISSUE ONLY WITH AND WITHOUT CONTRAST;  Surgeon:  Radiologist, Medication, MD;  Location: [facility];  Service: Radiology;  Laterality: N/A;   RADIOLOGY WITH ANESTHESIA N/A 10/04/2019   Procedure: MRI WITH ANESTHESIA RIGHT SHOULDER WITHOUT CONTRAST,MRI OF CERVICAL SPINE WITHOUT CONTRAST;  Surgeon: Radiologist, Medication, MD;  Location: [facility];  Service: Radiology;  Laterality: N/A;   RADIOLOGY WITH ANESTHESIA N/A 08/30/2020   Procedure: MRI WITH ANESTHESIA CERVICAL SPINE WITHOUT CONTRAST;  Surgeon: Radiologist, Medication, MD;  Location: [facility];  Service: Radiology;  Laterality: N/A;   RADIOLOGY WITH ANESTHESIA N/A 09/02/2022   Procedure: MRI WITH ANESTHESIA OF LUMBAR SPINE WITHOUT CONTRAST;  Surgeon: Radiologist, Medication, MD;  Location: [facility];  Service: Radiology;  Laterality: N/A;   right foot bunionectomy  RIGHT/LEFT HEART CATH AND CORONARY ANGIOGRAPHY N/A 04/26/2021   Procedure: RIGHT/LEFT HEART CATH AND CORONARY ANGIOGRAPHY;  Surgeon: Kyra Phy, MD;  Location: [facility];  Service: Cardiovascular;  Laterality: N/A;   ROBOTIC ASSISTED TOTAL HYSTERECTOMY WITH BILATERAL SALPINGO OOPHERECTOMY Bilateral 06/16/2018   Procedure: XI ROBOTIC ASSISTED TOTAL HYSTERECTOMY WITH RIGHT SALPINGO OOPHORECTOMY;  Surgeon: Arlee Lace, MD;  Location: [facility];  Service: Gynecology;  Laterality: Bilateral;   TOTAL HIP ARTHROPLASTY  05/02/2011   Procedure: TOTAL HIP ARTHROPLASTY ANTERIOR APPROACH;  Surgeon: Arnie Lao, MD;  Location: [facility];  Service: Orthopedics;  Laterality: Left;  Left Total Hip Arthroplasty, Direct Anterior Approach   TOTAL KNEE ARTHROPLASTY Right 12/03/2012   Procedure: RIGHT TOTAL KNEE ARTHROPLASTY;  Surgeon: Arnie Lao, MD;  Location: [facility];  Service: Orthopedics;  Laterality: Right;   TOTAL KNEE ARTHROPLASTY Left 12/15/2014   Procedure: LEFT TOTAL KNEE ARTHROPLASTY;  Surgeon: Arnie Lao, MD;  Location: [facility];  Service: Orthopedics;  Laterality: Left;   TUBAL LIGATION     ULNAR  NERVE TRANSPOSITION     left    UPPER GI ENDOSCOPY     x 2   Family History  Problem Relation Age of Onset   Heart disease Mother    High Cholesterol Mother    High blood pressure Mother    Asthma Mother    Hypertension Mother    Depression Brother    Diabetes Brother    Social History   Socioeconomic History   Marital status: Widowed    Spouse name: Burdette Carolin   Number of children: 2   Years of education: GED   Highest education level: GED or equivalent  Occupational History   Occupation: disabled  Tobacco Use   Smoking status: Former    Current packs/day: 1.00    Average packs/day: 1 pack/day for 44.0 years (44.0 ttl pk-yrs)    Types: Cigarettes    Passive exposure: Current   Smokeless tobacco: Never   Tobacco comments:    Smokes 3 1/2 pack of cigarettes a week. 07/21/22 Tay.  Vaping Use   Vaping status: Never Used  Substance and Sexual Activity   Alcohol  use: Yes    Alcohol /week: 49.0 standard drinks of alcohol     Types: 49 Cans of beer per week   Drug use: No    Comment: hx of 34 years ago marijuana    Sexual activity: Not Currently  Other Topics Concern   Not on file  Social History Narrative   Not on file   Social Drivers of Health   Financial Resource Strain: Low Risk  (08/06/2023)   Overall Financial Resource Strain (CARDIA)    Difficulty of Paying Living Expenses: Not hard at all  Recent Concern: Financial Resource Strain - Medium Risk (05/14/2023)   Overall Financial Resource Strain (CARDIA)    Difficulty of Paying Living Expenses: Somewhat hard  Food Insecurity: No Food Insecurity (08/06/2023)   Hunger Vital Sign    Worried About Running Out of Food in the Last Year: Never true    Ran Out of Food in the Last Year: Never true  Transportation Needs: No Transportation Needs (08/06/2023)   PRAPARE - Administrator, Civil Service (Medical): No    Lack of Transportation (Non-Medical): No  Physical Activity: Inactive (08/06/2023)   Exercise Vital Sign     Days of Exercise per Week: 0 days    Minutes of Exercise per Session: 0 min  Stress: No  Stress Concern Present (08/06/2023)   [employer] of Occupational Health - Occupational Stress Questionnaire    Feeling of Stress : Only a little  Recent Concern: Stress - Stress Concern Present (05/14/2023)   [employer] of Occupational Health - Occupational Stress Questionnaire    Feeling of Stress : Very much  Social Connections: Socially Isolated (08/06/2023)   Social Connection and Isolation Panel [NHANES]    Frequency of Communication with Friends and Family: More than three times a week    Frequency of Social Gatherings with Friends and Family: More than three times a week    Attends Religious Services: Never    Database administrator or Organizations: No    Attends Banker Meetings: Never    Marital Status: Widowed    Tobacco Counseling Counseling given: Not Answered Tobacco comments: Smokes 3 1/2 pack of cigarettes a week. 07/21/22 Tay.    Clinical Intake:  Pre-visit preparation completed: Yes  Pain : No/denies pain     Nutritional Risks: None Diabetes: No  Lab Results  Component Value Date   HGBA1C 5.5 03/04/2021     How often do you need to have someone help you when you read instructions, pamphlets, or other written materials from your doctor or pharmacy?: 1 - Never  Interpreter Needed?: No  Information entered by :: NAllen LPN   Activities of Daily Living     08/06/2023    1:41 PM 09/02/2022    6:59 AM  In your present state of health, do you have any difficulty performing the following activities:  Hearing? 0 0  Vision? 0 1  Comment  Up close  Difficulty concentrating or making decisions? 0 0  Walking or climbing stairs? 1 1  Comment due to back Due to back pain  Dressing or bathing? 0 0  Doing errands, shopping? 0   Preparing Food and eating ? N   Using the Toilet? N   In the past six months, have you accidently leaked urine? Y   Comment  wears a pad   Do you have problems with loss of bowel control? N   Managing your Medications? N   Managing your Finances? N   Housekeeping or managing your Housekeeping? N     Patient Care Team: Noreene Bearded, PA as PCP - General (Family Medicine) Thukkani, Arun K, MD as PCP - Cardiology (Cardiology)  Indicate any recent Medical Services you may have received from other than [facility] providers in the past year (date may be approximate).     Assessment:   This is a routine wellness examination for Tashonna.  Hearing/Vision screen Hearing Screening - Comments:: Denies hearing issues Vision Screening - Comments:: No regular eye exams   Goals Addressed             This Visit's Progress    Patient Stated       08/06/2023,eat healthier to lose weight       Depression Screen     08/06/2023    1:55 PM 05/18/2023   10:27 AM 03/16/2023   11:04 AM 01/14/2023   10:17 AM 10/13/2022    1:35 PM 08/25/2022   11:16 AM 07/31/2022    1:12 PM  PHQ 2/9 Scores  PHQ - 2 Score 1 2 6 4 6 2  0  PHQ- 9 Score 8 13 18 13 21   0    Fall Risk     08/06/2023    1:55 PM 05/18/2023   10:27 AM 08/25/2022  11:16 AM 07/31/2022    1:16 PM 07/08/2022    3:30 PM  Fall Risk   Falls in the past year? 0 0 1 1 1   Number falls in past yr: 0 0 0 0 0  Comment   no new fall    Injury with Fall? 0  1 1 1   Comment    Bruised rt ear, followed by medical attention   Risk for fall due to : Medication side effect No Fall Risks History of fall(s) No Fall Risks   Follow up Falls prevention discussed;Falls evaluation completed Falls evaluation completed  Falls prevention discussed Falls evaluation completed    MEDICARE RISK AT HOME:  Medicare Risk at Home Any stairs in or around the home?: Yes If so, are there any without handrails?: Yes Home free of loose throw rugs in walkways, pet beds, electrical cords, etc?: Yes Adequate lighting in your home to reduce risk of falls?: Yes Life alert?: No Use of a cane, walker or  w/c?: No Grab bars in the bathroom?: Yes Shower chair or bench in shower?: Yes Elevated toilet seat or a handicapped toilet?: Yes  TIMED UP AND GO:  Was the test performed?  No  Cognitive Function: 6CIT completed        08/06/2023    1:58 PM 07/31/2022    1:18 PM 03/07/2021    3:06 PM  6CIT Screen  What Year? 0 points 0 points 0 points  What month? 0 points 0 points 0 points  What time? 0 points 0 points 0 points  Count back from 20 0 points 0 points 0 points  Months in reverse 0 points 0 points 2 points  Repeat phrase 0 points 0 points 0 points  Total Score 0 points 0 points 2 points    Immunizations Immunization History  Administered Date(s) Administered   Influenza, Quadrivalent, Recombinant, Inj, Pf 03/12/2018   Moderna SARS-COV2 Booster Vaccination 04/26/2020   Moderna Sars-Covid-2 Vaccination 09/10/2019, 10/19/2019   Tdap 04/21/2014    Screening Tests Health Maintenance  Topic Date Due   Hepatitis C Screening  Never done   Pneumonia Vaccine 21+ Years old (1 of 2 - PCV) Never done   Zoster Vaccines- Shingrix (1 of 2) Never done   MAMMOGRAM  02/13/2017   COVID-19 Vaccine (3 - Moderna risk series) 05/24/2020   DEXA SCAN  Never done   Lung Cancer Screening  07/18/2023   INFLUENZA VACCINE  11/06/2023   DTaP/Tdap/Td (2 - Td or Tdap) 04/21/2024   Medicare Annual Wellness (AWV)  08/05/2024   Colonoscopy  11/04/2027   HPV VACCINES  Aged Out   Meningococcal B Vaccine  Aged Out    Health Maintenance  Health Maintenance Due  Topic Date Due   Hepatitis C Screening  Never done   Pneumonia Vaccine 35+ Years old (1 of 2 - PCV) Never done   Zoster Vaccines- Shingrix (1 of 2) Never done   MAMMOGRAM  02/13/2017   COVID-19 Vaccine (3 - Moderna risk series) 05/24/2020   DEXA SCAN  Never done   Lung Cancer Screening  07/18/2023   Health Maintenance Items Addressed: Due for pneumonia, shingles and covid vaccine. States she will get mammogram when she can work it  in.  Additional Screening:  Vision Screening: Recommended annual ophthalmology exams for early detection of glaucoma and other disorders of the eye.  Dental Screening: Recommended annual dental exams for proper oral hygiene  Community Resource Referral / Chronic Care Management: CRR required this visit?  No   CCM required this visit?  No     Plan:     I have personally reviewed and noted the following in the patient's chart:   Medical and social history Use of alcohol , tobacco or illicit drugs  Current medications and supplements including opioid prescriptions. Patient is not currently taking opioid prescriptions. Functional ability and status Nutritional status Physical activity Advanced directives List of other physicians Hospitalizations, surgeries, and ER visits in previous 12 months Vitals Screenings to include cognitive, depression, and falls Referrals and appointments  In addition, I have reviewed and discussed with patient certain preventive protocols, quality metrics, and best practice recommendations. A written personalized care plan for preventive services as well as general preventive health recommendations were provided to patient.     Areatha Beecham, LPN   4/0/9811   After Visit Summary: (MyChart) Due to this being a telephonic visit, the after visit summary with patients personalized plan was offered to patient via MyChart   Notes: Nothing significant to report at this time.

## 2023-08-17 DIAGNOSIS — M545 Low back pain, unspecified: Secondary | ICD-10-CM | POA: Diagnosis not present

## 2023-08-28 ENCOUNTER — Other Ambulatory Visit: Payer: Self-pay | Admitting: Internal Medicine

## 2023-08-30 ENCOUNTER — Ambulatory Visit (HOSPITAL_BASED_OUTPATIENT_CLINIC_OR_DEPARTMENT_OTHER): Attending: Cardiology | Admitting: Cardiology

## 2023-08-30 VITALS — Ht 67.0 in | Wt 266.0 lb

## 2023-08-30 DIAGNOSIS — I10 Essential (primary) hypertension: Secondary | ICD-10-CM | POA: Diagnosis not present

## 2023-08-30 DIAGNOSIS — G4733 Obstructive sleep apnea (adult) (pediatric): Secondary | ICD-10-CM | POA: Insufficient documentation

## 2023-08-30 DIAGNOSIS — R0683 Snoring: Secondary | ICD-10-CM | POA: Diagnosis not present

## 2023-08-30 DIAGNOSIS — I493 Ventricular premature depolarization: Secondary | ICD-10-CM | POA: Insufficient documentation

## 2023-09-01 ENCOUNTER — Other Ambulatory Visit: Payer: Self-pay

## 2023-09-01 DIAGNOSIS — Z87891 Personal history of nicotine dependence: Secondary | ICD-10-CM

## 2023-09-01 DIAGNOSIS — Z122 Encounter for screening for malignant neoplasm of respiratory organs: Secondary | ICD-10-CM

## 2023-09-03 NOTE — Progress Notes (Signed)
 Cardiology Office Note:   Date:  09/11/2023  ID:  Katrina [name], DOB 10-Feb-1956, MRN [id number] PCP:  Noreene Bearded, PA  [facility] Providers Cardiologist:  Alyssa Backbone, MD Referring MD: Noreene Bearded, PA  Chief Complaint/Reason for Referral: Follow-up for heart failure ASSESSMENT:    1. NICM (nonischemic cardiomyopathy) (HCC)   2. Hyperlipidemia LDL goal <70   3. Aortic atherosclerosis (HCC)   4. Stage 3a chronic kidney disease (HCC)   5. Bilateral carotid artery stenosis   6. BMI 40.0-44.9, adult (HCC)   7. OSA (obstructive sleep apnea)     PLAN:   In order of problems listed above: Nonischemic cardiomyopathy: Continue Jardiance  25, losartan  25, Toprol  100, spironolactone  25.  Feeling better and seems euvolemic on exam. Hyperlipidemia: Check lipid panel, LFTs today.   Aortic atherosclerosis: Continue aspirin  81 mg, atorvastatin  80 mg, Zetia  10 mg, and strict blood pressure control. CKD stage IIIa: Continue losartan  25 mg and Jardiance  25 mg for renal protection. Carotid artery stenosis: Continue atorvastatin  80 mg, aspirin  81 mg, and strict blood pressure control. Elevated BMI: Check hemoglobin A1c to screen for diabetes. OSA: Patient had sleep study recently and will make sure she has follow-up in sleep medicine clinic.            Dispo:  Return in about 6 months (around 03/12/2024).      Medication Adjustments/Labs and Tests Ordered: Current medicines are reviewed at length with the patient today.  Concerns regarding medicines are outlined above.  The following changes have been made:  no change   Labs/tests ordered: Orders Placed This Encounter  Procedures   Hemoglobin A1c   Hepatic function panel   Lipid panel    Medication Changes: No orders of the defined types were placed in this encounter.   Current medicines are reviewed at length with the patient today.  The patient does not have concerns regarding medicines.  I spent 38 minutes  reviewing all clinical data during and prior to this visit including all relevant imaging studies, laboratories, clinical information from other health systems and prior notes from both Cardiology and other specialties, interviewing the patient, conducting a complete physical examination, and coordinating care in order to formulate a comprehensive and personalized evaluation and treatment plan.   History of Present Illness:    FOCUSED PROBLEM LIST:   (HFrEF) heart failure with reduced ejection fraction Nonischemic cardiomyopathy LHC 04/26/2021: Normal coronary arteries TTE 01/08/2022: EF 35-40% Monitor 01/2022: NSVT (9 beats), <1% PACs, 2% PVCs TTE 08/12/2022: EF 35-40%, global HK, mild LVH, normal RVSF, moderate LAE, trivial MR, RAP 3 GDMT limited by finances  Aortic atherosclerosis CT 2022 Left bundle-branch block Hyperlipidemia LP(a) less than 8.4 CKD IIIA Obesity Former tobacco use -quit 08/2022 Carotid US  08/20/2021: Bilateral ICA 1-39 AAA US  08/20/2021: No AAA OSA   BMI 40 COPD/Emphysema  December 2022: Seen for preoperative evaluation.  Due to left bundle branch block an echocardiogram was obtained which showed a new cardiomyopathy.  She is referred for coronary angiography which showed no obstructive coronary artery disease.   January 2023: Seen in follow-up.  She started on Toprol , losartan , spironolactone , and Jardiance .   April 2023: An echocardiogram done earlier this month demonstrated ejection fraction of 35 to 40%.  There is no significant valvular abnormalities.  The patient underwent uncomplicated surgery with podiatry.  Patient tells me her breathing is fairly good.  Because of her issues with her ankle she has not been walking around as much and is  gained a little bit of weight.  She occasionally gets lightheaded when going from sitting to standing.  She denies any chest pain.  She is tolerating her medications without any issues.  She continues to smoke and is trying to  quit.  She is trying to use Nicorette gum.  Her husband unfortunately was hospitalized for quite some time and this really increased her stress level.  Plan: Increase Jardiance  to 25 mg, Cozaar  to 50 mg check lipid panel LFTs carotid ultrasound abdominal ultrasound for screening purposes given longstanding tobacco abuse.   October 2023: Carotid and abdominal ultrasound demonstrated no significant abnormalities.  Her atorvastatin  was increased to 80 given above goal LDL.  The patient tells me that she has had occasional presyncope and palpitations at times.  This happens maybe once or twice a week.  Last time it happened was about a week ago.  She denies any exertional chest pain, peripheral edema, but does have problems lying flat.  She feels uncomfortable in terms of breathing.  She denies any signs or symptoms of stroke, or severe bleeding though she has some nuisance bruising while on aspirin .  She has required no emergency room visits or hospitalizations.  She is unable to afford her Jardiance  and therefore has not been taking it.  She unfortunately continues to smoke.  Plan: Stop losartan  and start Entresto , increase Toprol  to 100 mg, obtain monitor due to presyncope and check lipid panel, and LFTs day of next visit.   January 2024: The patient is doing well from a cardiovascular standpoint.  She denies any significant shortness of breath, peripheral edema, or paroxysmal nocturnal dyspnea.  She unfortunately has been not taking her Jardiance  or Entresto  for about a year.  She fortunately not required any emergency room visits or hospitalizations.  She still smoking when her life stressors are exacerbated but things seem to be relatively calm for now.  She has been compliant with the medications that she is able to receive.  She is working on her core in terms of strengthening because she may need back surgery in the future.  Plan: Start losartan  50 mg, check lipids, LFTs, LP(a) today.  June 2025:   In  the interim the patient was seen by pharmacy and started on Zetia .  Most recent lipid panel done in September 2024 showed an LDL of 59.  She had an echocardiogram done in March which demonstrated ejection fraction of 35 to 40%.  She was referred to EP.  She was not thought to be a good candidate for PVC ablation.  Because her ejection fraction was above 35% no devices were considered.  The patient had a sleep study recently which was positive.  She has not yet heard back from the sleep study section.  Terms of her breathing she actually feels somewhat better.  She is lost 6 pounds.  She denies any significant shortness of breath.  She feels like her breathing got much better after she quit smoking.  Her biggest issue seems to be back pain.  She has very debilitating back pain and is not a candidate for surgical intervention until she loses more weight.  She otherwise denies any chest pain, severe palpitations, paroxysmal nocturnal dyspnea.     Current Medications: No outpatient medications have been marked as taking for the 09/11/23 encounter (Office Visit) with Katrina Kiddy K, MD.     Review of Systems:   Please see the history of present illness.    All other systems reviewed and  are negative.     EKGs/Labs/Other Test Reviewed:   EKG: April 2025 normal sinus rhythm with left bundle branch block  EKG Interpretation Date/Time:    Ventricular Rate:    PR Interval:    QRS Duration:    QT Interval:    QTC Calculation:   R Axis:      Text Interpretation:           Risk Assessment/Calculations:     STOP-Bang Score:  5       Physical Exam:   VS:  BP 101/62   Pulse 90   Ht 5\' 7"  (1.702 m)   Wt 260 lb (117.9 kg)   SpO2 96%   BMI 40.72 kg/m        Wt Readings from Last 3 Encounters:  09/11/23 260 lb (117.9 kg)  08/30/23 266 lb (120.7 kg)  07/15/23 266 lb (120.7 kg)      GENERAL:  No apparent distress, AOx3 HEENT:  No carotid bruits, +2 carotid impulses, no scleral  icterus CAR: RRR no murmurs, gallops, rubs, or thrills RES:  Clear to auscultation bilaterally ABD:  Soft, nontender, nondistended, positive bowel sounds x 4 VASC:  +2 radial pulses, +2 carotid pulses NEURO:  CN 2-12 grossly intact; motor and sensory grossly intact PSYCH:  No active depression or anxiety EXT:  No edema, ecchymosis, or cyanosis  Signed, Katrina Goebel [name] Farrin Shadle, MD  09/11/2023 11:04 AM    [facility] [address] [place], [place], [place]  [address] Phone: [phone]; Fax: [phone]   Note:  This document was prepared using Dragon voice recognition software and may include unintentional dictation errors.

## 2023-09-04 NOTE — Procedures (Signed)
     [facility] [address] [place], [place] [address] Tel: [phone]   Fax: [phone]  Titration Interpretation  Patient Name:  [name], Katrina Date:  08/30/2023 Referring Physician:  Gaylyn Keas, MD  Indications for Polysomnography The patient is a 68 year-old Female who is 5\' 7"  and weighs 266.0 lbs. Her BMI equals 41.7.  A full night titration treatment study was performed.  Medication  No Data.   Polysomnogram Data A full night polysomnogram recorded the standard physiologic parameters including EEG, EOG, EMG, EKG, nasal and oral airflow.  Respiratory parameters of chest and abdominal movements were recorded with Respiratory Inductance Plethysmography belts.  Oxygen saturation was recorded by pulse oximetry.   Sleep Architecture The total recording time of the polysomnogram was 411.4 minutes.  The total sleep time was 269.0 minutes.  The patient spent 10.6% of total sleep time in Stage N1, 40.9% in Stage N2, 29.2% in Stages N3, and 19.3% in REM.  Sleep latency was 11.4 minutes.  REM latency was 69.5 minutes.  Sleep Efficiency was 65.4%.  Wake after Sleep Onset time was 131.0 minutes.  Titration Summary The patient was titrated at pressures ranging from 5 cm/H20 up to 9 cm/H20. The last pressure used in the study was 9 cm/H20.  Respiratory Events The polysomnogram revealed a presence of 0 obstructive, 1 central, and 0 mixed apneas resulting in an Apnea index of 0.2 events per hour.  There were 2 hypopneas (>=3% desaturation and/or arousal) resulting in an Apnea\Hypopnea Index (AHI >=3% desaturation and/or arousal) of 0.7 events per hour.  There were 1 hypopneas (>=4% desaturation) resulting in an Apnea\Hypopnea Index (AHI >=4% desaturation) of 0.4 events per hour.  There were 38 Respiratory Effort Related Arousals resulting in a RERA index of 8.5 events per hour. The Respiratory Disturbance Index is 9.1 events per hour.  The snore  index was 0 events per hour.  Mean oxygen saturation was 92.9%.  The lowest oxygen saturation during sleep was 87.0%.  Time spent <=88% oxygen saturation was 0.2 minutes.  Limb Activity There were 0 limb movements recorded.   Cardiac Summary The average pulse rate was 79.1 bpm.  The minimum pulse rate was 36.0 bpm while the maximum pulse rate was 225.0 bpm.  Cardiac rhythm was normal with PVCs   Diagnosis:  Obstructive Sleep Apnea PVCs  Recommendations: Recommend a trial of ResMed CPAP at 9cm H2O with ResMed Medium Airfit P10 nasal pillow mask with heated humidity. The patient should be counseled in good sleep hygiene. Encourage the patient to avoid driving when sleepy.  The patient should avoid sleeping in the supine position. Followup in Sleep Medicine Clinic in 6 weeks.   This study was personally reviewed and electronically signed by: Gaylyn Keas, MD Accredited Board Certified in Sleep Medicine Date/Time: 09/04/2023 12:20PM

## 2023-09-10 ENCOUNTER — Ambulatory Visit: Payer: Medicare Other | Admitting: Internal Medicine

## 2023-09-11 ENCOUNTER — Encounter: Payer: Self-pay | Admitting: Internal Medicine

## 2023-09-11 ENCOUNTER — Ambulatory Visit: Attending: Internal Medicine | Admitting: Internal Medicine

## 2023-09-11 VITALS — BP 101/62 | HR 90 | Ht 67.0 in | Wt 260.0 lb

## 2023-09-11 DIAGNOSIS — N1831 Chronic kidney disease, stage 3a: Secondary | ICD-10-CM | POA: Diagnosis not present

## 2023-09-11 DIAGNOSIS — Z6841 Body Mass Index (BMI) 40.0 and over, adult: Secondary | ICD-10-CM | POA: Diagnosis not present

## 2023-09-11 DIAGNOSIS — I700 Atherosclerosis of aorta: Secondary | ICD-10-CM | POA: Diagnosis not present

## 2023-09-11 DIAGNOSIS — I428 Other cardiomyopathies: Secondary | ICD-10-CM

## 2023-09-11 DIAGNOSIS — E785 Hyperlipidemia, unspecified: Secondary | ICD-10-CM | POA: Diagnosis not present

## 2023-09-11 DIAGNOSIS — I6523 Occlusion and stenosis of bilateral carotid arteries: Secondary | ICD-10-CM

## 2023-09-11 DIAGNOSIS — G4733 Obstructive sleep apnea (adult) (pediatric): Secondary | ICD-10-CM

## 2023-09-11 LAB — LIPID PANEL

## 2023-09-11 NOTE — Patient Instructions (Signed)
 Medication Instructions:  No changes *If you need a refill on your cardiac medications before your next appointment, please call your pharmacy*  Lab Work: Today: lipids, liver and hgA1c - first floor lab  If you have labs (blood work) drawn today and your tests are completely normal, you will receive your results only by: MyChart Message (if you have MyChart) OR A paper copy in the mail If you have any lab test that is abnormal or we need to change your treatment, we will call you to review the results.  Testing/Procedures: none  Follow-Up: NEEDS SLEEP FOLLOW UP SCHEDULED  At [facility], you and your health needs are our priority.  As part of our continuing mission to provide you with exceptional heart care, our providers are all part of one team.  This team includes your primary Cardiologist (physician) and Advanced Practice Providers or APPs (Physician Assistants and Nurse Practitioners) who all work together to provide you with the care you need, when you need it.  Your next appointment:   6 month(s)  Provider:   Marlyse Single, PA-C       Other Instructions

## 2023-09-12 ENCOUNTER — Ambulatory Visit: Payer: Self-pay | Admitting: Internal Medicine

## 2023-09-12 DIAGNOSIS — E785 Hyperlipidemia, unspecified: Secondary | ICD-10-CM

## 2023-09-12 LAB — HEMOGLOBIN A1C
Est. average glucose Bld gHb Est-mCnc: 114 mg/dL
Hgb A1c MFr Bld: 5.6 % (ref 4.8–5.6)

## 2023-09-12 LAB — HEPATIC FUNCTION PANEL
ALT: 20 IU/L (ref 0–32)
AST: 17 IU/L (ref 0–40)
Albumin: 4.4 g/dL (ref 3.9–4.9)
Alkaline Phosphatase: 146 IU/L — ABNORMAL HIGH (ref 44–121)
Bilirubin Total: 0.6 mg/dL (ref 0.0–1.2)
Bilirubin, Direct: 0.17 mg/dL (ref 0.00–0.40)
Total Protein: 6.8 g/dL (ref 6.0–8.5)

## 2023-09-12 LAB — LIPID PANEL
Chol/HDL Ratio: 3.8 ratio (ref 0.0–4.4)
Cholesterol, Total: 172 mg/dL (ref 100–199)
HDL: 45 mg/dL (ref >39)
LDL Chol Calc (NIH): 89 mg/dL (ref 0–99)
Triglycerides: 223 mg/dL — ABNORMAL HIGH (ref 0–149)
VLDL Cholesterol Cal: 38 mg/dL (ref 5–40)

## 2023-09-14 ENCOUNTER — Telehealth: Payer: Self-pay

## 2023-09-14 NOTE — Telephone Encounter (Signed)
-----   Message from Gaylyn Keas sent at 09/04/2023 12:21 PM EDT ----- Please let patient know that they had a successful PAP titration and let DME know that orders are in EPIC.  Please set up 6 week OV with me.

## 2023-09-14 NOTE — Telephone Encounter (Signed)
 Notified patient of successful PAP Titration. Order for new device and supplies has been sent to [facility] today. All questions were answered and patient verbalized understanding.

## 2023-09-17 DIAGNOSIS — M545 Low back pain, unspecified: Secondary | ICD-10-CM | POA: Diagnosis not present

## 2023-10-01 DIAGNOSIS — F330 Major depressive disorder, recurrent, mild: Secondary | ICD-10-CM | POA: Diagnosis not present

## 2023-10-02 DIAGNOSIS — G4733 Obstructive sleep apnea (adult) (pediatric): Secondary | ICD-10-CM | POA: Diagnosis not present

## 2023-10-17 DIAGNOSIS — M545 Low back pain, unspecified: Secondary | ICD-10-CM | POA: Diagnosis not present

## 2023-11-01 DIAGNOSIS — G4733 Obstructive sleep apnea (adult) (pediatric): Secondary | ICD-10-CM | POA: Diagnosis not present

## 2023-11-15 ENCOUNTER — Other Ambulatory Visit: Payer: Self-pay

## 2023-11-15 DIAGNOSIS — Z1159 Encounter for screening for other viral diseases: Secondary | ICD-10-CM

## 2023-11-15 DIAGNOSIS — R5383 Other fatigue: Secondary | ICD-10-CM

## 2023-11-15 DIAGNOSIS — I10 Essential (primary) hypertension: Secondary | ICD-10-CM

## 2023-11-17 ENCOUNTER — Other Ambulatory Visit

## 2023-11-17 DIAGNOSIS — R5383 Other fatigue: Secondary | ICD-10-CM

## 2023-11-17 DIAGNOSIS — Z1159 Encounter for screening for other viral diseases: Secondary | ICD-10-CM

## 2023-11-17 DIAGNOSIS — I10 Essential (primary) hypertension: Secondary | ICD-10-CM | POA: Diagnosis not present

## 2023-11-17 DIAGNOSIS — Z6839 Body mass index (BMI) 39.0-39.9, adult: Secondary | ICD-10-CM | POA: Diagnosis not present

## 2023-11-17 DIAGNOSIS — M545 Low back pain, unspecified: Secondary | ICD-10-CM | POA: Diagnosis not present

## 2023-11-17 DIAGNOSIS — E66812 Obesity, class 2: Secondary | ICD-10-CM | POA: Diagnosis not present

## 2023-11-18 ENCOUNTER — Ambulatory Visit: Payer: Self-pay

## 2023-11-18 LAB — COMPREHENSIVE METABOLIC PANEL WITH GFR
ALT: 19 IU/L (ref 0–32)
AST: 17 IU/L (ref 0–40)
Albumin: 4.2 g/dL (ref 3.9–4.9)
Alkaline Phosphatase: 154 IU/L — ABNORMAL HIGH (ref 44–121)
BUN/Creatinine Ratio: 9 — ABNORMAL LOW (ref 12–28)
BUN: 9 mg/dL (ref 8–27)
Bilirubin Total: 0.7 mg/dL (ref 0.0–1.2)
CO2: 20 mmol/L (ref 20–29)
Calcium: 10 mg/dL (ref 8.7–10.3)
Chloride: 103 mmol/L (ref 96–106)
Creatinine, Ser: 0.99 mg/dL (ref 0.57–1.00)
Globulin, Total: 2.5 g/dL (ref 1.5–4.5)
Glucose: 92 mg/dL (ref 70–99)
Potassium: 4.5 mmol/L (ref 3.5–5.2)
Sodium: 142 mmol/L (ref 134–144)
Total Protein: 6.7 g/dL (ref 6.0–8.5)
eGFR: 62 mL/min/1.73 (ref >59)

## 2023-11-18 LAB — CBC WITH DIFFERENTIAL/PLATELET
Basophils Absolute: 0.1 x10E3/uL (ref 0.0–0.2)
Basos: 1 %
EOS (ABSOLUTE): 0.6 x10E3/uL — ABNORMAL HIGH (ref 0.0–0.4)
Eos: 6 %
Hematocrit: 43.3 % (ref 34.0–46.6)
Hemoglobin: 14 g/dL (ref 11.1–15.9)
Immature Grans (Abs): 0 x10E3/uL (ref 0.0–0.1)
Immature Granulocytes: 0 %
Lymphocytes Absolute: 3.6 x10E3/uL — ABNORMAL HIGH (ref 0.7–3.1)
Lymphs: 32 %
MCH: 30.8 pg (ref 26.6–33.0)
MCHC: 32.3 g/dL (ref 31.5–35.7)
MCV: 95 fL (ref 79–97)
Monocytes Absolute: 0.7 x10E3/uL (ref 0.1–0.9)
Monocytes: 6 %
Neutrophils Absolute: 6.1 x10E3/uL (ref 1.4–7.0)
Neutrophils: 55 %
Platelets: 338 x10E3/uL (ref 150–450)
RBC: 4.55 x10E6/uL (ref 3.77–5.28)
RDW: 12.7 % (ref 11.7–15.4)
WBC: 11.2 x10E3/uL — ABNORMAL HIGH (ref 3.4–10.8)

## 2023-11-18 LAB — LIPID PANEL
Chol/HDL Ratio: 3.2 ratio (ref 0.0–4.4)
Cholesterol, Total: 168 mg/dL (ref 100–199)
HDL: 53 mg/dL (ref >39)
LDL Chol Calc (NIH): 84 mg/dL (ref 0–99)
Triglycerides: 183 mg/dL — ABNORMAL HIGH (ref 0–149)
VLDL Cholesterol Cal: 31 mg/dL (ref 5–40)

## 2023-11-18 LAB — HEMOGLOBIN A1C
Est. average glucose Bld gHb Est-mCnc: 120 mg/dL
Hgb A1c MFr Bld: 5.8 % — ABNORMAL HIGH (ref 4.8–5.6)

## 2023-11-18 LAB — TSH: TSH: 1.93 u[IU]/mL (ref 0.450–4.500)

## 2023-11-18 LAB — HEPATITIS C ANTIBODY: Hep C Virus Ab: NONREACTIVE

## 2023-11-18 LAB — VITAMIN D 25 HYDROXY (VIT D DEFICIENCY, FRACTURES): Vit D, 25-Hydroxy: 25.1 ng/mL — ABNORMAL LOW (ref 30.0–100.0)

## 2023-11-24 ENCOUNTER — Telehealth: Payer: Self-pay

## 2023-11-24 ENCOUNTER — Ambulatory Visit (INDEPENDENT_AMBULATORY_CARE_PROVIDER_SITE_OTHER)

## 2023-11-24 VITALS — BP 113/80 | HR 63 | Temp 97.6°F | Ht 67.0 in | Wt 257.0 lb

## 2023-11-24 DIAGNOSIS — E782 Mixed hyperlipidemia: Secondary | ICD-10-CM

## 2023-11-24 DIAGNOSIS — R5383 Other fatigue: Secondary | ICD-10-CM

## 2023-11-24 DIAGNOSIS — R748 Abnormal levels of other serum enzymes: Secondary | ICD-10-CM

## 2023-11-24 DIAGNOSIS — R1011 Right upper quadrant pain: Secondary | ICD-10-CM | POA: Diagnosis not present

## 2023-11-24 DIAGNOSIS — R1084 Generalized abdominal pain: Secondary | ICD-10-CM | POA: Diagnosis not present

## 2023-11-24 DIAGNOSIS — N950 Postmenopausal bleeding: Secondary | ICD-10-CM | POA: Diagnosis not present

## 2023-11-24 DIAGNOSIS — Z0000 Encounter for general adult medical examination without abnormal findings: Secondary | ICD-10-CM | POA: Insufficient documentation

## 2023-11-24 DIAGNOSIS — I10 Essential (primary) hypertension: Secondary | ICD-10-CM

## 2023-11-24 DIAGNOSIS — F1010 Alcohol abuse, uncomplicated: Secondary | ICD-10-CM

## 2023-11-24 DIAGNOSIS — R109 Unspecified abdominal pain: Secondary | ICD-10-CM | POA: Insufficient documentation

## 2023-11-24 LAB — POCT URINALYSIS DIPSTICK
Bilirubin, UA: NEGATIVE
Glucose, UA: NEGATIVE
Ketones, UA: NEGATIVE
Nitrite, UA: NEGATIVE
Protein, UA: POSITIVE — AB
Spec Grav, UA: >=1.03 — AB (ref 1.010–1.025)
Urobilinogen, UA: 0.2 U/dL
pH, UA: 6.5 (ref 5.0–8.0)

## 2023-11-24 MED ORDER — CEPHALEXIN 500 MG PO CAPS: 500.0000 mg | ORAL_CAPSULE | Freq: Three times a day (TID) | ORAL | 0 refills | Status: DC

## 2023-11-24 NOTE — Assessment & Plan Note (Signed)
 Patient declined referral for mammogram and DEXA scan today. Will revisit discussion at next visit.

## 2023-11-24 NOTE — Assessment & Plan Note (Signed)
 Alk phos persistently elevated for >2 years now. Patient was worked up for this back in October 2024. GGT was WNL and she was referred to endocrinology. Endocrinology prescribed vitamin D  and said that if Alk Phos remained elevated, they recommended follow up with bone scan. Will encourage patient to schedule a follow up with endocrinology given that Alk Phos is still elevated. Will also order CT Abdomen Pelvis to rule out other causes of liver function abnormality.

## 2023-11-24 NOTE — Patient Instructions (Signed)
 VISIT SUMMARY: During your visit, we discussed your gastrointestinal issues, vaginal bleeding, and other health concerns. We reviewed your symptoms, medical history, and current medications. We also talked about the impact of stress on your health and made a plan to address each of your concerns.  YOUR PLAN: -ABDOMINAL PAIN AND GASTROINTESTINAL SYMPTOMS: You have been experiencing abdominal pain, alternating constipation and diarrhea, nausea, bloating, and abdominal pressure. These symptoms could be due to gastrointestinal issues, a urinary tract infection (UTI), or gynecological causes. We will do a urine analysis to check for a UTI or kidney stones, refer you to gynecology for further evaluation, and recommend over-the-counter fiber supplements and probiotics. We will also order a liver ultrasound to check for any issues with your liver.  -POSTMENOPAUSAL VAGINAL BLEEDING AFTER HYSTERECTOMY: You have had intermittent vaginal bleeding since your hysterectomy, which may be related to stress and anxiety. This could be due to issues with the remaining endometrial or cervical tissue. We will refer you to gynecology for further evaluation and a potential transvaginal ultrasound.  -ELEVATED LIVER ENZYMES: Your liver enzymes are elevated, which could be due to fatty liver disease or alcohol  use. We will order a liver ultrasound to investigate further.  -PRE-DIABETES: Your A1c levels are in the prediabetic range. We recommend managing this with dietary modifications and regular monitoring.  -HYPERTENSION: Your blood pressure is well-controlled with your current medications. Continue taking your antihypertensive medications as prescribed.  -HYPERTRIGLYCERIDEMIA: Your triglyceride levels are slightly elevated but improving with your current medications. Continue your current medication regimen.  -URINARY INCONTINENCE: You have chronic urinary incontinence, which is being managed with your current medications  and lifestyle adjustments. Continue with your current management plan.  -VITAMIN D  INSUFFICIENCY: Your vitamin D  levels are insufficient but have improved. We recommend dietary changes to include more vitamin D -rich foods and increased sun exposure.  INSTRUCTIONS: Please follow up with the gynecology referral for further evaluation of your vaginal bleeding and potential ultrasound. Complete the urine analysis as ordered. We will also schedule a liver ultrasound to investigate your elevated liver enzymes. Consider seeing an orthopedic specialist for your chronic back pain. Continue with your current medications and lifestyle recommendations, and make the sug gested dietary modifications and increase your physical activity gradually.  If you have any problems before your next visit feel free to message me via MyChart (minor issues or questions) or call the office, otherwise you may reach out to schedule an office visit.  Thank you! Saddie Sacks, PA-C

## 2023-11-24 NOTE — Assessment & Plan Note (Signed)
 BP goal <130/80. Stable, at goal. Cont losartan  50 mg daily, metoprolol  succinate 100 mg daily, spironolactone  25 mg daily. Will cont to monitor.

## 2023-11-24 NOTE — Telephone Encounter (Signed)
 Called and LVM/ Per Saddie: would like to re-check urine in 2 weeks. Called office so we can get you scheduled.

## 2023-11-24 NOTE — Progress Notes (Signed)
 Established Patient Office Visit  Subjective   Patient ID: Katrina [name], female    DOB: [birth year]  Age: 68 y.o. MRN: [id number]  Chief Complaint  Patient presents with   Medical Management of Chronic Issues    HPI  Discussed the use of AI scribe software for clinical note transcription with the patient, who gave verbal consent to proceed.  History of Present Illness   Katrina [name] is a 68 year old female who presents with gastrointestinal issues and vaginal bleeding. She is accompanied by her daughter, Katrina [name], and her grandson, Katrina [name].  Gastrointestinal symptoms - Gastrointestinal symptoms present for several weeks to a couple of months - Bowel movements alternate between constipation with hard, pellet-like stools and diarrhea - Feels that the right side of her abdomen is significantly more protuberant than the left side. Also reports RUQ pain that is severe at times and accompanied with intermittent nausea.  - Has hx of cholecystectomy - Nausea and sensation of fullness or heaviness in the stomach and intestines after eating - Unintentional weight loss from 266 pounds to 257 pounds over the last few weeks - Gas present, including passage of gas per vagina  Vaginal bleeding and pelvic symptoms - Intermittent vaginal bleeding since hysterectomy four years ago, particularly during periods of stress - Previously used a prescribed vaginal cream but discontinued due to cost - Sensation of pelvic organs slipping out when lying down - Lower abdominal pain described as a need to defecate, not similar to menstrual cramps. Pressure.  - History of hysterectomy four years ago with left ovary left intact due to scar tissue  Alcohol  use - History of alcohol  use, currently significantly reduced - Reports she is currently consuming 5-6 beers per day   Hypertension Patient currently taking losartan  and metoprolol  for control of their blood pressure. Reports excellence compliance  with this medication. Denies side effects or episodes of hypotension. Checks BP at home periodically. Denies CP, SOB, Palpitations, vision changes, HA, or edema.  Hyperlipidemia:  Patient currently taking atorvastatin  and ezetimibe  daily for their cholesterol. Reports excellent compliance with this medication. Denies side effects/new myalgias.         ROS Per HPI.    Objective:     BP 113/80   Pulse 63   Temp 97.6 F (36.4 C) (Oral)   Ht 5' 7 (1.702 m)   Wt 257 lb 0.6 oz (116.6 kg)   SpO2 94%   BMI 40.26 kg/m    Physical Exam Constitutional:      General: She is not in acute distress.    Appearance: Normal appearance.  Cardiovascular:     Rate and Rhythm: Normal rate and regular rhythm.     Heart sounds: Normal heart sounds. No murmur heard.    No friction rub. No gallop.  Pulmonary:     Effort: Pulmonary effort is normal. No respiratory distress.     Breath sounds: Normal breath sounds.  Abdominal:     General: Bowel sounds are normal. There is distension.     Tenderness: There is abdominal tenderness. There is no right CVA tenderness or left CVA tenderness.     Comments: TTP over RUQ. Right UQ appears to be visibly more protuberant than LUQ.  Musculoskeletal:        General: No swelling.  Skin:    General: Skin is warm and dry.  Neurological:     General: No focal deficit present.     Mental Status: She is alert.  Psychiatric:  Mood and Affect: Mood normal.        Behavior: Behavior normal.        Thought Content: Thought content normal.      Results for orders placed or performed in visit on 11/24/23  POCT Urinalysis Dipstick  Result Value Ref Range   Color, UA YELLOW    Clarity, UA CLEAR    Glucose, UA Negative Negative   Bilirubin, UA NEGATIVE    Ketones, UA NEGATIVE    Spec Grav, UA >=1.030 (A) 1.010 - 1.025   Blood, UA MODERATE    pH, UA 6.5 5.0 - 8.0   Protein, UA Positive (A) Negative   Urobilinogen, UA 0.2 0.2 or 1.0 E.U./dL    Nitrite, UA NEGATIVE    Leukocytes, UA Trace (A) Negative   Appearance     Odor      Last CBC Lab Results  Component Value Date   WBC 11.2 (H) 11/17/2023   HGB 14.0 11/17/2023   HCT 43.3 11/17/2023   MCV 95 11/17/2023   MCH 30.8 11/17/2023   RDW 12.7 11/17/2023   PLT 338 11/17/2023   Last metabolic panel Lab Results  Component Value Date   GLUCOSE 92 11/17/2023   NA 142 11/17/2023   K 4.5 11/17/2023   CL 103 11/17/2023   CO2 20 11/17/2023   BUN 9 11/17/2023   CREATININE 0.99 11/17/2023   EGFR 62 11/17/2023   CALCIUM  10.0 11/17/2023   PHOS 3.2 06/01/2022   PROT 6.7 11/17/2023   ALBUMIN 4.2 11/17/2023   LABGLOB 2.5 11/17/2023   AGRATIO 1.6 07/30/2021   BILITOT 0.7 11/17/2023   ALKPHOS 154 (H) 11/17/2023   AST 17 11/17/2023   ALT 19 11/17/2023   ANIONGAP 7 06/03/2022   Last lipids Lab Results  Component Value Date   CHOL 168 11/17/2023   HDL 53 11/17/2023   LDLCALC 84 11/17/2023   TRIG 183 (H) 11/17/2023   CHOLHDL 3.2 11/17/2023   Last hemoglobin A1c Lab Results  Component Value Date   HGBA1C 5.8 (H) 11/17/2023   Last thyroid  functions Lab Results  Component Value Date   TSH 1.930 11/17/2023   Last vitamin D  Lab Results  Component Value Date   VD25OH 25.1 (L) 11/17/2023      The 10-year ASCVD risk score (Arnett DK, et al., 2019) is: 6.8%    Assessment & Plan:   Generalized abdominal pain Assessment & Plan: Intermittent abdominal pain with alternating constipation and diarrhea, nausea, bloating, and abdominal pressure. Differential includes gastrointestinal issues, UTI, or gynecological causes. Hx of alcoholism and elevated liver enzymes may contribute. - UA positive for moderate blood, small leuks. Will treat with Keflex  500 mg TID x 5 days for now. Cx ordered. - Plan  to repeat UA in 2 weeks with follow up urine microscopic  - CT scan abdomen/pelvis ordered today to rule out liver abnormalities vs kidney stone vs GYN. Will f/u with patient via  MyChart with results. - Refer to gynecology for evaluation of postmenopausal bleeding and potential ultrasound. - Recommend OTC fiber supplements and probiotics to regulate bowel movements.   Orders: -     POCT urinalysis dipstick -     CT ABDOMEN PELVIS W WO CONTRAST; Future -     Urinalysis, Routine w reflex microscopic; Future -     Gamma GT; Future -     CBC with Differential/Platelet; Future -     Comprehensive metabolic panel with GFR; Future  Postmenopausal bleeding Assessment & Plan: ntermittent  vaginal bleeding post-hysterectomy 4 years ago. Discussed with patient the etiologies of blood being from the urethra vs vagina. She reports that she has used toilet paper to confirm source of blood from the vagina. Declined vaginal exam in the office today saying she was not prepared to have a vaginal exam in office so unable to determine source of bleeding for myself today.  - Refer to gynecology for evaluation and potential transvaginal ultrasound.  Orders: -     Ambulatory referral to Obstetrics / Gynecology -     CBC with Differential/Platelet; Future  Other fatigue -     Urine Culture  Right upper quadrant abdominal pain Assessment & Plan: Intermittent abdominal pain with alternating constipation and diarrhea, nausea, bloating, and abdominal pressure. Differential includes gastrointestinal issues, UTI, or gynecological causes. Hx of alcoholism and elevated liver enzymes may contribute. - UA positive for moderate blood, small leuks. Will treat with Keflex  500 mg TID x 5 days for now. Cx ordered. - Plan  to repeat UA in 2 weeks with follow up urine microscopic  - CT scan abdomen/pelvis ordered today to rule out liver abnormalities vs kidney stone vs GYN. Will f/u with patient via MyChart with results. - Refer to gynecology for evaluation of postmenopausal bleeding and potential ultrasound. - Recommend OTC fiber supplements and probiotics to regulate bowel movements.   Orders: -      CT ABDOMEN PELVIS W WO CONTRAST; Future  Elevated alkaline phosphatase level Assessment & Plan: Alk phos persistently elevated for >2 years now. Patient was worked up for this back in October 2024. GGT was WNL and she was referred to endocrinology. Endocrinology prescribed vitamin D  and said that if Alk Phos remained elevated, they recommended follow up with bone scan. Will encourage patient to schedule a follow up with endocrinology given that Alk Phos is still elevated. Will also order CT Abdomen Pelvis to rule out other causes of liver function abnormality.    Chronic alcohol  abuse Assessment & Plan: Patient currently drinking 5-6 beers per day, which is significantly decreased from previous. Discussed the importance of continuing to decrease usage to prevent worsening of liver function.   Essential hypertension Assessment & Plan: BP goal <130/80. Stable, at goal. Cont losartan  50 mg daily, metoprolol  succinate 100 mg daily, spironolactone  25 mg daily. Will cont to monitor.   Mixed hyperlipidemia Assessment & Plan: Last lipid panel: LDL 84, HDL 53, Triglycerides 183. The 10-year ASCVD risk score (Arnett DK, et al., 2019) is: 6.8% Continue Lipitor 80 mg and Zetia  10 mg daily. Will cont to monitor.   Healthcare maintenance Assessment & Plan: Patient declined referral for mammogram and DEXA scan today. Will revisit discussion at next visit.   Other orders -     Cephalexin ; Take 1 capsule (500 mg total) by mouth 3 (three) times daily.  Dispense: 15 capsule; Refill: 0       Return in about 4 months (around 03/25/2024) for HTN, HLD.    Saddie JULIANNA Sacks, PA-C

## 2023-11-24 NOTE — Assessment & Plan Note (Signed)
 Last lipid panel: LDL 84, HDL 53, Triglycerides 183. The 10-year ASCVD risk score (Arnett DK, et al., 2019) is: 6.8% Continue Lipitor 80 mg and Zetia  10 mg daily. Will cont to monitor.

## 2023-11-24 NOTE — Assessment & Plan Note (Signed)
 Intermittent abdominal pain with alternating constipation and diarrhea, nausea, bloating, and abdominal pressure. Differential includes gastrointestinal issues, UTI, or gynecological causes. Hx of alcoholism and elevated liver enzymes may contribute. - UA positive for moderate blood, small leuks. Will treat with Keflex  500 mg TID x 5 days for now. Cx ordered. - Plan  to repeat UA in 2 weeks with follow up urine microscopic  - CT scan abdomen/pelvis ordered today to rule out liver abnormalities vs kidney stone vs GYN. Will f/u with patient via MyChart with results. - Refer to gynecology for evaluation of postmenopausal bleeding and potential ultrasound. - Recommend OTC fiber supplements and probiotics to regulate bowel movements.

## 2023-11-24 NOTE — Assessment & Plan Note (Signed)
 Patient currently drinking 5-6 beers per day, which is significantly decreased from previous. Discussed the importance of continuing to decrease usage to prevent worsening of liver function.

## 2023-11-24 NOTE — Assessment & Plan Note (Signed)
 ntermittent vaginal bleeding post-hysterectomy 4 years ago. Discussed with patient the etiologies of blood being from the urethra vs vagina. She reports that she has used toilet paper to confirm source of blood from the vagina. Declined vaginal exam in the office today saying she was not prepared to have a vaginal exam in office so unable to determine source of bleeding for myself today.  - Refer to gynecology for evaluation and potential transvaginal ultrasound.

## 2023-11-25 ENCOUNTER — Telehealth: Payer: Self-pay | Admitting: *Deleted

## 2023-11-25 LAB — URINALYSIS, ROUTINE W REFLEX MICROSCOPIC
Bilirubin, UA: NEGATIVE
Glucose, UA: NEGATIVE
Ketones, UA: NEGATIVE
Nitrite, UA: NEGATIVE
Specific Gravity, UA: 1.023 (ref 1.005–1.030)
Urobilinogen, Ur: 1 mg/dL (ref 0.2–1.0)
pH, UA: 6 (ref 5.0–7.5)

## 2023-11-25 LAB — MICROSCOPIC EXAMINATION
Epithelial Cells (non renal): >10 /HPF — AB (ref 0–10)
RBC, Urine: NONE SEEN /HPF (ref 0–2)

## 2023-11-25 NOTE — Addendum Note (Signed)
 Addended byBETHA GAYLE NUMBERS on: 11/25/2023 05:13 PM   Modules accepted: Orders

## 2023-11-25 NOTE — Telephone Encounter (Signed)
 Message sent to the referral team to check about PA.

## 2023-11-25 NOTE — Telephone Encounter (Signed)
 Copied from CRM #[id number]. Topic: General - Other >> Nov 24, 2023  2:48 PM Sasha H wrote: Reason for CRM: [facility] needs a revised CT order for this pt stating with OR without not and

## 2023-11-25 NOTE — Telephone Encounter (Signed)
 Contacted [facility] and they stated that the CT needs to be wither with OR without contrast not both.  Please complete and I will send a message to the team to see if it has to be authorized again or not.

## 2023-11-26 LAB — URINE CULTURE

## 2023-12-02 DIAGNOSIS — G4733 Obstructive sleep apnea (adult) (pediatric): Secondary | ICD-10-CM | POA: Diagnosis not present

## 2023-12-04 ENCOUNTER — Ambulatory Visit: Attending: Cardiology | Admitting: Pharmacist

## 2023-12-04 ENCOUNTER — Telehealth: Payer: Self-pay | Admitting: Pharmacy Technician

## 2023-12-04 ENCOUNTER — Other Ambulatory Visit (HOSPITAL_COMMUNITY): Payer: Self-pay

## 2023-12-04 ENCOUNTER — Encounter: Payer: Self-pay | Admitting: Pharmacist

## 2023-12-04 ENCOUNTER — Telehealth: Payer: Self-pay | Admitting: Pharmacist

## 2023-12-04 VITALS — Ht 67.0 in | Wt 257.0 lb

## 2023-12-04 DIAGNOSIS — E7849 Other hyperlipidemia: Secondary | ICD-10-CM | POA: Diagnosis not present

## 2023-12-04 MED ORDER — REPATHA SURECLICK 140 MG/ML ~~LOC~~ SOAJ: 140.0000 mg | SUBCUTANEOUS | 3 refills | Status: AC

## 2023-12-04 NOTE — Patient Instructions (Signed)
 Your Results:             Your most recent labs Goal  Total Cholesterol 168  < 200  Triglycerides 183 < 150  HDL (happy/good cholesterol) 53 > 40  LDL (lousy/bad cholesterol 84 < 70   Medication changes: continue taking Atorvastatin  80 mg daily and Zetia  10 mg daily  We will start the process to get PCSK9i ( Repatha  or Praluent)   covered by your insurance.  Once the prior authorization is complete, we will call you to let you know and confirm pharmacy information.      Praluent is a cholesterol medication that improved your body's ability to get rid of bad cholesterol known as LDL. It can lower your LDL up to 60%. It is an injection that is given under the skin every 2 weeks. The most common side effects of Praluent include runny nose, symptoms of the common cold, rarely flu or flu-like symptoms, back/muscle pain in about 3-4% of the patients, and redness, pain, or bruising at the injection site.    Repatha  is a cholesterol medication that improved your body's ability to get rid of bad cholesterol known as LDL. It can lower your LDL up to 60%! It is an injection that is given under the skin every 2 weeks. The medication often requires a prior authorization from your insurance company. The most common side effects of Repatha  include runny nose, symptoms of the common cold, rarely flu or flu-like symptoms, back/muscle pain in about 3-4% of the patients, and redness, pain, or bruising at the injection site.   Lab orders: We want to repeat labs after 2-3 months.  We will send you a lab order to remind you once we get closer to that time.        Copay Assistance:  The [employer] foundation offers assistance to help pay for medication copays.  They will cover copays for all cholesterol lowering meds, including statins, fibrates, omega-3 oils, ezetimibe , Repatha , Praluent, Nexletol, Nexlizet.  The cards are usually good for $2,500 or 12 months, whichever comes first. Go to  healthwellfoundation.org Click on "Apply Now" Answer questions as to whom is applying (patient or representative) Your disease fund will be "hypercholesterolemia - Medicare access" Select the cholesterol medication you need assistance with (Repatha , Praluent, Nexlizet...) They will ask question about qualifying diagnosis - you can mark yes; and do you have insurance coverage.   When they ask what type of assistance you are interested in - copay assistance When you submit, the approval is usually within minutes.  You will need to print the card information from the site You will need to show this information to your pharmacy, they will bill your Medicare Part D plan first -then bill [employer] --for the copay.   You can also call them at [phone], although the hold times can be quite long.

## 2023-12-04 NOTE — Telephone Encounter (Signed)
 Approved starting 12/04/23- 12/03/24

## 2023-12-04 NOTE — Telephone Encounter (Signed)
 Patient Advocate Encounter   The patient was approved for a [employer] grant that will help cover the cost of repatha  Total amount awarded, 2500.00.  Effective: 11/04/23 - 11/02/24   APW:[id number] ERW:[id number] Hmnle:[id number] PI:[id number]  [employer] ID: [id number]   Pharmacy provided with approval and processing information. Patient informed via mychart

## 2023-12-04 NOTE — Assessment & Plan Note (Signed)
 Assessment:  LDL goal: < 70 mg/dl TG <849 mg/dl  last LDLc 84 mg/dl TG 816 (91/7974) while on Atorvastatin  80 mg daily and Zetia  10 mg daily   Tolerates Zetia  and high intensity statins well without any side effects  Discussed next potential options (PCSK-9 inhibitors, bempedoic acid and inclisiran); cost, dosing efficacy, side effects  Heathy diet dicussed in detail for elevated TG, physical ability limited due to chromic back pain   Plan: Continue taking current medications (Atorvastatin  80 mg daily and Zetia  10 mg daily ) Will apply for PA for PCSK9i; will inform patient upon approval  Lipid lab due in 2-3 months after starting PCSK9i

## 2023-12-04 NOTE — Telephone Encounter (Signed)
 Repatha  PA approved, pt made aware.

## 2023-12-04 NOTE — Telephone Encounter (Signed)
 Per pt calls   Pharmacy Patient Advocate Encounter   Received notification from Pt Calls Messages that prior authorization for REPATHA  is required/requested.   Insurance verification completed.   The patient is insured through [facility] .   Per test claim: PA required; PA submitted to above mentioned insurance via Latent Key/confirmation #/EOC [id number] Status is pending

## 2023-12-04 NOTE — Progress Notes (Signed)
 Patient ID: Katrina [name]                 DOB: 10/10/1955                    MRN: [id number]      HPI: Katrina [name] is a 68 y.o. female patient referred to lipid clinic by Dr.Thukkani. PMH is significant for hypertension, HFrEF, COPD, OSA, HLD.   LDL above 70 despite being on high intensity statin and Zetia   Patient presented today with her daughter and grandson. She has been taking Statin and Zetia  for long time and tolerates it well too. He lost her husband almost a year ago and since then they are living on very limited income. Her diet was all processed food but they are working on to improve it by cooking at home more and leaning towards more plant based food. Due to chronic back issue she is unable to do exercise Reviewed options for lowering LDL cholesterol, including  PCSK-9 inhibitors, bempedoic acid and inclisiran.  Discussed mechanisms of action, dosing, side effects and potential decreases in LDL cholesterol.  Also reviewed cost information and potential options for patient assistance.  Current Medications: Atorvastatin  80 mg daily and Zetia  10 mg daily  Intolerances: none  Risk Factors: OSA, HLD, HTN, family hx of high cholesterol and CAD, maternal grandfather died from MI at age 44 and her mom - MI at age of 40  LDL goal: <70 mg/dl, UH<849 mg/dl  Last lab: TC 831, TG 816, HDL 53, LDLc 84 while on high intensity statin and Zetia    Diet: working on to improve diet  Suggest to limit Starchy Foods,Limit Starchy Foods, Limit Starchy Foods  Exercise: none due to chronic back pain   Family History:  Relation Problem Comments  Mother - Administrator, arts (Alive) Asthma   Heart disease   High Cholesterol   High blood pressure   Hypertension     Father (Deceased)   Brother Depression   Diabetes       Social History:  Alcohol : 1-6 beers per day but total no more than 6 /week  Smoking : quit almost a  year ago   Labs:  Lipid Panel     Component Value Date/Time   CHOL  168 11/17/2023 0819   TRIG 183 (H) 11/17/2023 0819   HDL 53 11/17/2023 0819   CHOLHDL 3.2 11/17/2023 0819   LDLCALC 84 11/17/2023 0819   LABVLDL 31 11/17/2023 0819    Past Medical History:  Diagnosis Date   Alcohol  abuse    Anxiety    Arthritis    knees, hips, elbows    Asthma    hx of asthma 3/12- hospitalized    Chronic systolic heart failure (HCC)    COPD (chronic obstructive pulmonary disease) (HCC)    Depression    GERD (gastroesophageal reflux disease)    PRIOR TO HAVING GALLBLADDER REMOVED   Headache    from her neck   Hypertension    Left bundle branch block 06/28/2010   Lung infection 2015   Tobacco abuse     Current Outpatient Medications on File Prior to Visit  Medication Sig Dispense Refill   acetaminophen  (TYLENOL ) 500 MG tablet Take 1,000 mg by mouth every 6 (six) hours as needed for moderate pain.     albuterol  (VENTOLIN  HFA) 108 (90 Base) MCG/ACT inhaler Inhale 2 puffs into the lungs every 6 (six) hours as needed for wheezing or shortness of breath. 6.7  g 2   aspirin  EC 81 MG tablet Take 1 tablet (81 mg total) by mouth daily. Swallow whole. 90 tablet 3   atorvastatin  (LIPITOR) 80 MG tablet Take 1 tablet by mouth once daily 90 tablet 2   Budeson-Glycopyrrol-Formoterol (BREZTRI  AEROSPHERE) 160-9-4.8 MCG/ACT AERO Inhale 2 puffs into the lungs in the morning and at bedtime. 10.7 g 11   cephALEXin  (KEFLEX ) 500 MG capsule Take 1 capsule (500 mg total) by mouth 3 (three) times daily. 15 capsule 0   diclofenac  Sodium (VOLTAREN  ARTHRITIS PAIN) 1 % GEL Apply 2 g topically 4 (four) times daily as needed (pain).     ezetimibe  (ZETIA ) 10 MG tablet Take 1 tablet by mouth once daily 90 tablet 1   fluticasone  (FLONASE ) 50 MCG/ACT nasal spray Place 2 sprays into both nostrils daily. (Patient not taking: Reported on 11/24/2023) 16 g 2   losartan  (COZAAR ) 25 MG tablet Take 1 tablet (25 mg total) by mouth daily. 90 tablet 3   metoprolol  succinate (TOPROL -XL) 100 MG 24 hr tablet  Take 2 tablets (200 mg total) by mouth daily. Take with or immediately following a meal. 180 tablet 3   ondansetron  (ZOFRAN ) 4 MG tablet Take 1 tablet (4 mg total) by mouth every 8 (eight) hours as needed for nausea or vomiting. (Patient not taking: Reported on 11/24/2023) 60 tablet 0   spironolactone  (ALDACTONE ) 25 MG tablet TAKE 1 TABLET BY MOUTH AT BEDTIME (Patient not taking: Reported on 11/24/2023) 90 tablet 2   [DISCONTINUED] clonazePAM  (KLONOPIN ) 0.5 MG tablet Take 1 tablet (0.5 mg total) by mouth 2 (two) times daily as needed for anxiety. 30 tablet 0   No current facility-administered medications on file prior to visit.    Allergies  Allergen Reactions   Codeine  Itching and Nausea Only    Assessment/Plan:  1. Hyperlipidemia -  No problems updated. No problem-specific Assessment & Plan notes found for this encounter.    Thank you,  Robbi Blanch, Pharm.D [facility]. [facility] [address], [place], [place] [address] Phone: [phone]; Fax: [phone]

## 2023-12-04 NOTE — Telephone Encounter (Signed)
 Pharmacy Patient Advocate Encounter  Received notification from [facility] that Prior Authorization for REPATHA  has been APPROVED from 12/04/23 to 12/03/24. Ran test claim, Copay is $45.00. This test claim was processed through [facility]- copay amounts may vary at other pharmacies due to pharmacy/plan contracts, or as the patient moves through the different stages of their insurance plan.   PA #/Case ID/Reference #: [id number]

## 2023-12-08 ENCOUNTER — Other Ambulatory Visit

## 2023-12-14 ENCOUNTER — Telehealth: Payer: Self-pay | Admitting: Internal Medicine

## 2023-12-14 NOTE — Telephone Encounter (Signed)
 Pt is calling about her CPAP machine. Insurance is requiring she make an appt, but she cannot afford right now. Wondering phone appt. Please advise.

## 2023-12-16 ENCOUNTER — Ambulatory Visit
Admission: RE | Admit: 2023-12-16 | Discharge: 2023-12-16 | Disposition: A | Discharge status: Home / Self Care | Source: Ambulatory Visit

## 2023-12-16 DIAGNOSIS — R634 Abnormal weight loss: Secondary | ICD-10-CM | POA: Diagnosis not present

## 2023-12-16 DIAGNOSIS — R1084 Generalized abdominal pain: Secondary | ICD-10-CM | POA: Diagnosis not present

## 2023-12-16 DIAGNOSIS — R109 Unspecified abdominal pain: Secondary | ICD-10-CM | POA: Diagnosis not present

## 2023-12-16 DIAGNOSIS — K5750 Diverticulosis of both small and large intestine without perforation or abscess without bleeding: Secondary | ICD-10-CM | POA: Diagnosis not present

## 2023-12-16 MED ORDER — IOHEXOL 300 MG/ML  SOLN
100.0000 mL | Freq: Once | INTRAMUSCULAR | Status: AC | PRN
  Administered 2023-12-16: 100 mL via INTRAVENOUS

## 2023-12-21 ENCOUNTER — Telehealth: Payer: Self-pay

## 2023-12-21 NOTE — Telephone Encounter (Signed)
 Called the Reading Room/ spoke with Gordy. Related the message from provider.

## 2023-12-21 NOTE — Telephone Encounter (Signed)
-----   Message from Saddie JULIANNA Sacks sent at 12/21/2023 12:57 PM EDT ----- Same for this. Can we call reading room and see if they will bump this to the top of their list to read it please. Thank you! ----- Message ----- From: SYSTEM Sent: 12/21/2023  12:27 AM EDT To: Saddie JULIANNA Sacks, PA-C

## 2023-12-27 ENCOUNTER — Ambulatory Visit: Payer: Self-pay

## 2024-01-02 DIAGNOSIS — G4733 Obstructive sleep apnea (adult) (pediatric): Secondary | ICD-10-CM | POA: Diagnosis not present

## 2024-01-03 NOTE — Progress Notes (Unsigned)
 SLEEP MEDICINE VIRTUAL CONSULT NOTE via Video Note   Because of EMARA LICHTER co-morbid illnesses, she is at least at moderate risk for complications without adequate follow up.  This format is felt to be most appropriate for this patient at this time.  All issues noted in this document were discussed and addressed.  A limited physical exam was performed with this format.  Please refer to the patient's chart for her consent to telehealth for [facility].      Date:  01/04/2024   ID:  Katrina [name], DOB September 15, 1955, MRN [id number] The patient was identified using 2 identifiers.  Patient Location: Home Provider Location: Home Office   PCP:  [name]   [facility] Providers Cardiologist:  Lurena MARLA Red, MD     Evaluation Performed:  New Patient Evaluation  Chief Complaint: Obstructive sleep apnea  History of Present Illness:    Katrina [name] is a 68 y.o. female who is being seen today for the evaluation of obstructive sleep apnea at the request of Lurena Red, MD.  Katrina [name] is a 68 y.o. female with a history of alcohol  abuse, anxiety, asthma, chronic systolic CHF, COPD, depression, GERD, hypertension, left bundle branch block and ongoing tobacco abuse.  Was seen by Glendia Ferrier 12/19/2022 at which time patient complained of snoring and excessive daytime sleepiness with a STOP-BANG score of 5.    Home sleep study was ordered which showed mild to moderate obstructive sleep apnea with an AHI of 13.6/h overall but moderate during REM sleep with a REM AHI of 25.1/h.  There was moderate snoring and nocturnal hypoxemia with O2 saturations less than 88% for 27.7 minutes.  O2 saturation nadir was 79%.  She underwent CPAP titration on 08/30/2023 and was titrated to ResMed CPAP at 9 cm H2O with a medium AirFit P10 nasal pillow mask.  She is now referred for sleep medicine consultation for treatment of obstructive sleep apnea  She tells me  that she does great until she has allergies or colds and then cannot use the device.  She uses a nasal pillow mask so when her nose is stuffed up.  She has had problems recently with allergies and nasal congestion so she has not been using it.  She feels the pressure is adequate.  She does not feel rested in the am.  She gets sleepy during the day and sometimes naps during the day.  She denies any significant mouth or nasal dryness. Patient denies any episodes of bruxism, restless legs, No hypnagonic hallucinations or cataplectic events.    Past Medical History:  Diagnosis Date   Alcohol  abuse    Anxiety    Arthritis    knees, hips, elbows    Asthma    hx of asthma 3/12- hospitalized    Chronic systolic heart failure (HCC)    COPD (chronic obstructive pulmonary disease) (HCC)    Depression    GERD (gastroesophageal reflux disease)    PRIOR TO HAVING GALLBLADDER REMOVED   Headache    from her neck   Hypertension    Left bundle branch block 06/28/2010   Lung infection 2015   Tobacco abuse    Past Surgical History:  Procedure Laterality Date   BACK SURGERY     x2   CARPAL TUNNEL RELEASE     left    CESAREAN SECTION     x 2   CHOLECYSTECTOMY     COLONOSCOPY  10/2017   DILATATION & CURETTAGE/HYSTEROSCOPY WITH MYOSURE N/A 12/10/2017   Procedure: DILATATION & CURETTAGE/HYSTEROSCOPY WITH MYOSURE;  Surgeon: Rosalva Sawyer, MD;  Location: [facility];  Service: Gynecology;  Laterality: N/A;  POSSIBLE MYOMECTOMY VS POLYPECTOMY WITH MYOSURE   DILATION AND CURETTAGE OF UTERUS     ELBOW SURGERY     JOINT REPLACEMENT     KNEE ARTHROSCOPY     bilateral x 2    KNEE ARTHROSCOPY Left 11/03/2013   Procedure: LEFT KNEE ARTHROSCOPY WITH DEBRIDEMENT, PARTIAL SYNOVECTOMY;  Surgeon: Lonni CINDERELLA Poli, MD;  Location: [facility];  Service: Orthopedics;  Laterality: Left;   LAPAROSCOPY     left elbow surgery      OTHER SURGICAL HISTORY     arthroswcopic surgery right knee    OTHER SURGICAL HISTORY      arthroscopic surgery left knee x 2    OTHER SURGICAL HISTORY     bunionectomy right foot    OTHER SURGICAL HISTORY     C Section x 2    OTHER SURGICAL HISTORY     carpal tunnel on left    POSTERIOR CERVICAL FUSION/FORAMINOTOMY N/A 04/02/2020   Procedure: RIGHT C5-6 FORAMINOTOMY;  Surgeon: Lucilla Lynwood BRAVO, MD;  Location: [facility];  Service: Orthopedics;  Laterality: N/A;   RADIOLOGY WITH ANESTHESIA N/A 07/28/2019   Procedure: MRI WITH ANESTHESIA OF NECK SOFT TISSUE ONLY WITH AND WITHOUT CONTRAST;  Surgeon: Radiologist, Medication, MD;  Location: [facility];  Service: Radiology;  Laterality: N/A;   RADIOLOGY WITH ANESTHESIA N/A 10/04/2019   Procedure: MRI WITH ANESTHESIA RIGHT SHOULDER WITHOUT CONTRAST,MRI OF CERVICAL SPINE WITHOUT CONTRAST;  Surgeon: Radiologist, Medication, MD;  Location: [facility];  Service: Radiology;  Laterality: N/A;   RADIOLOGY WITH ANESTHESIA N/A 08/30/2020   Procedure: MRI WITH ANESTHESIA CERVICAL SPINE WITHOUT CONTRAST;  Surgeon: Radiologist, Medication, MD;  Location: [facility];  Service: Radiology;  Laterality: N/A;   RADIOLOGY WITH ANESTHESIA N/A 09/02/2022   Procedure: MRI WITH ANESTHESIA OF LUMBAR SPINE WITHOUT CONTRAST;  Surgeon: Radiologist, Medication, MD;  Location: [facility];  Service: Radiology;  Laterality: N/A;   right foot bunionectomy      RIGHT/LEFT HEART CATH AND CORONARY ANGIOGRAPHY N/A 04/26/2021   Procedure: RIGHT/LEFT HEART CATH AND CORONARY ANGIOGRAPHY;  Surgeon: Wendel Lurena POUR, MD;  Location: [facility];  Service: Cardiovascular;  Laterality: N/A;   ROBOTIC ASSISTED TOTAL HYSTERECTOMY WITH BILATERAL SALPINGO OOPHERECTOMY Bilateral 06/16/2018   Procedure: XI ROBOTIC ASSISTED TOTAL HYSTERECTOMY WITH RIGHT SALPINGO OOPHORECTOMY;  Surgeon: Rosalva Sawyer, MD;  Location: [facility] ;  Service: Gynecology;  Laterality: Bilateral;   TOTAL HIP ARTHROPLASTY  05/02/2011   Procedure: TOTAL HIP ARTHROPLASTY ANTERIOR APPROACH;  Surgeon: Lonni CINDERELLA Poli, MD;   Location: [facility];  Service: Orthopedics;  Laterality: Left;  Left Total Hip Arthroplasty, Direct Anterior Approach   TOTAL KNEE ARTHROPLASTY Right 12/03/2012   Procedure: RIGHT TOTAL KNEE ARTHROPLASTY;  Surgeon: Lonni CINDERELLA Poli, MD;  Location: [facility];  Service: Orthopedics;  Laterality: Right;   TOTAL KNEE ARTHROPLASTY Left 12/15/2014   Procedure: LEFT TOTAL KNEE ARTHROPLASTY;  Surgeon: Lonni CINDERELLA Poli, MD;  Location: [facility];  Service: Orthopedics;  Laterality: Left;   TUBAL LIGATION     ULNAR NERVE TRANSPOSITION     left    UPPER GI ENDOSCOPY     x 2     Current Meds  Medication Sig   acetaminophen  (TYLENOL ) 500 MG tablet Take 1,000 mg by mouth every 6 (six) hours as needed for  moderate pain.   albuterol  (VENTOLIN  HFA) 108 (90 Base) MCG/ACT inhaler Inhale 2 puffs into the lungs every 6 (six) hours as needed for wheezing or shortness of breath.   aspirin  EC 81 MG tablet Take 1 tablet (81 mg total) by mouth daily. Swallow whole.   atorvastatin  (LIPITOR) 80 MG tablet Take 1 tablet by mouth once daily   Budeson-Glycopyrrol-Formoterol (BREZTRI  AEROSPHERE) 160-9-4.8 MCG/ACT AERO Inhale 2 puffs into the lungs in the morning and at bedtime.   diclofenac  Sodium (VOLTAREN  ARTHRITIS PAIN) 1 % GEL Apply 2 g topically 4 (four) times daily as needed (pain).   Evolocumab  (REPATHA  SURECLICK) 140 MG/ML SOAJ Inject 140 mg into the skin every 14 (fourteen) days.   losartan  (COZAAR ) 25 MG tablet Take 1 tablet (25 mg total) by mouth daily.   metoprolol  succinate (TOPROL -XL) 100 MG 24 hr tablet Take 2 tablets (200 mg total) by mouth daily. Take with or immediately following a meal.   spironolactone  (ALDACTONE ) 25 MG tablet TAKE 1 TABLET BY MOUTH AT BEDTIME     Allergies:   Codeine    Social History   Tobacco Use   Smoking status: Former    Current packs/day: 1.00    Average packs/day: 1 pack/day for 44.0 years (44.0 ttl pk-yrs)    Types: Cigarettes    Passive exposure: Current   Smokeless  tobacco: Never   Tobacco comments:    Smokes 3 1/2 pack of cigarettes a week. 07/21/22 [place].  Vaping Use   Vaping status: Never Used  Substance Use Topics   Alcohol  use: Yes    Alcohol /week: 49.0 standard drinks of alcohol     Types: 49 Cans of beer per week   Drug use: No    Comment: hx of 34 years ago marijuana      Family Hx: The patient's family history includes Asthma in her mother; Depression in her brother; Diabetes in her brother; Heart disease in her mother; High Cholesterol in her mother; High blood pressure in her mother; Hypertension in her mother.  ROS:   Please see the history of present illness.     All other systems reviewed and are negative.   Prior Sleep studies:   The following studies were reviewed today:  Home sleep study, CPAP titration, PAP compliance download  Labs/Other Tests and Data Reviewed:     Recent Labs: 11/17/2023: ALT 19; BUN 9; Creatinine, Ser 0.99; Hemoglobin 14.0; Platelets 338; Potassium 4.5; Sodium 142; TSH 1.930    Wt Readings from Last 3 Encounters:  01/04/24 257 lb (116.6 kg)  12/04/23 257 lb (116.6 kg)  11/24/23 257 lb 0.6 oz (116.6 kg)     Vital Signs:  Ht 5' 7 (1.702 m)   Wt 257 lb (116.6 kg)   BMI 40.25 kg/m    VITAL SIGNS:  reviewed GEN:  no acute distress EYES:  sclerae anicteric, EOMI - Extraocular Movements Intact RESPIRATORY:  normal respiratory effort, symmetric expansion CARDIOVASCULAR:  no peripheral edema SKIN:  no rash, lesions or ulcers. MUSCULOSKELETAL:  no obvious deformities. NEURO:  alert and oriented x 3, no obvious focal deficit PSYCH:  normal affect  ASSESSMENT & PLAN:    OSA - The patient is tolerating PAP therapy well without any problems. The PAP download performed by his DME was personally reviewed and interpreted by me today and showed an AHI of 0.5/hr on 9 cm H2O with 0% compliance in using more than 4 hours nightly.  The patient has been using and benefiting from PAP use and  will continue to  benefit from therapy.  -she has had problems with allergies and colds recently and cannot use the nasal pillow mask when her nose is stuffed up -I will order a full face mask to help her when she is stuffed up -I have encouraged her to try nasal saline spray 2 sprays twice daily and Flonase  1 spray each nostril qam -encouraged her to be more compliant with her device   Time:   Today, I have spent 20 minutes with the patient with telehealth technology discussing the above problems.     Medication Adjustments/Labs and Tests Ordered: Current medicines are reviewed at length with the patient today.  Concerns regarding medicines are outlined above.   Tests Ordered: No orders of the defined types were placed in this encounter.   Medication Changes: No orders of the defined types were placed in this encounter.   Follow Up:  In Person in 2 month(s)  Signed, Wilbert Bihari, MD  01/04/2024 9:55 AM    [facility]

## 2024-01-04 ENCOUNTER — Ambulatory Visit: Attending: Cardiology | Admitting: Cardiology

## 2024-01-04 ENCOUNTER — Telehealth: Payer: Self-pay

## 2024-01-04 VITALS — Ht 67.0 in | Wt 257.0 lb

## 2024-01-04 DIAGNOSIS — G4733 Obstructive sleep apnea (adult) (pediatric): Secondary | ICD-10-CM

## 2024-01-04 MED ORDER — SALINE SPRAY 0.65 % NA SOLN: 2.0000 | Freq: Two times a day (BID) | NASAL | 3 refills | Status: AC

## 2024-01-04 MED ORDER — FLUTICASONE PROPIONATE 50 MCG/ACT NA SUSP: 1.0000 | Freq: Every day | NASAL | 3 refills | Status: AC

## 2024-01-04 NOTE — Telephone Encounter (Signed)
 Katrina [name]

## 2024-01-04 NOTE — Addendum Note (Signed)
 Addended by: JANIT GENI CROME on: 01/04/2024 04:03 PM   Modules accepted: Orders

## 2024-01-04 NOTE — Patient Instructions (Signed)
 Medication Instructions:  Please START using nasal saline spray. Use 2 sprays each nostril twice a day.   Please START using flonase  nasal spray. Use 1 spray in each nostril once a day.   *If you need a refill on your cardiac medications before your next appointment, please call your pharmacy*  Lab Work: None.  If you have labs (blood work) drawn today and your tests are completely normal, you will receive your results only by: MyChart Message (if you have MyChart) OR A paper copy in the mail If you have any lab test that is abnormal or we need to change your treatment, we will call you to review the results.  Testing/Procedures: None.  Follow-Up: At [facility], you and your health needs are our priority.  As part of our continuing mission to provide you with exceptional heart care, our providers are all part of one team.  This team includes your primary Cardiologist (physician) and Advanced Practice Providers or APPs (Physician Assistants and Nurse Practitioners) who all work together to provide you with the care you need, when you need it.  Your next appointment will be a virtual visit on Friday, 02/26/24 at 9:20 AM. Please let us  know if this appointment date/time won't work for you. Your appointment will be with:    Provider:   Dr. Wilbert Bihari, MD

## 2024-01-05 ENCOUNTER — Telehealth: Payer: Self-pay | Admitting: *Deleted

## 2024-01-05 DIAGNOSIS — G4733 Obstructive sleep apnea (adult) (pediatric): Secondary | ICD-10-CM

## 2024-01-05 DIAGNOSIS — I10 Essential (primary) hypertension: Secondary | ICD-10-CM

## 2024-01-05 DIAGNOSIS — R0683 Snoring: Secondary | ICD-10-CM

## 2024-01-05 NOTE — Telephone Encounter (Signed)
-----   Message from Katrina [name] sent at 01/04/2024  9:54 AM EDT ----- Needs DME in person visit for fitting for FFM to use when congested

## 2024-01-05 NOTE — Telephone Encounter (Signed)
 DME=[facility]  ORDER PLACED TO [facility] VIA COMMUNITY MESSAGE

## 2024-01-17 DIAGNOSIS — M545 Low back pain, unspecified: Secondary | ICD-10-CM | POA: Diagnosis not present

## 2024-01-30 ENCOUNTER — Other Ambulatory Visit: Payer: Self-pay | Admitting: Internal Medicine

## 2024-02-08 ENCOUNTER — Encounter: Payer: Self-pay | Admitting: Radiology

## 2024-02-18 ENCOUNTER — Telehealth: Payer: Self-pay

## 2024-02-26 ENCOUNTER — Ambulatory Visit: Admitting: Cardiology

## 2024-03-07 ENCOUNTER — Other Ambulatory Visit: Payer: Self-pay | Admitting: Internal Medicine

## 2024-03-10 ENCOUNTER — Other Ambulatory Visit

## 2024-03-10 DIAGNOSIS — R1084 Generalized abdominal pain: Secondary | ICD-10-CM | POA: Diagnosis not present

## 2024-03-10 DIAGNOSIS — N950 Postmenopausal bleeding: Secondary | ICD-10-CM | POA: Diagnosis not present

## 2024-03-11 LAB — COMPREHENSIVE METABOLIC PANEL WITH GFR
ALT: 20 IU/L (ref 0–32)
AST: 18 IU/L (ref 0–40)
Albumin: 4.2 g/dL (ref 3.9–4.9)
Alkaline Phosphatase: 131 IU/L (ref 49–135)
BUN/Creatinine Ratio: 13 (ref 12–28)
BUN: 13 mg/dL (ref 8–27)
Bilirubin Total: 0.5 mg/dL (ref 0.0–1.2)
CO2: 20 mmol/L (ref 20–29)
Calcium: 10.1 mg/dL (ref 8.7–10.3)
Chloride: 100 mmol/L (ref 96–106)
Creatinine, Ser: 0.99 mg/dL (ref 0.57–1.00)
Globulin, Total: 2.8 g/dL (ref 1.5–4.5)
Glucose: 105 mg/dL — ABNORMAL HIGH (ref 70–99)
Potassium: 5.2 mmol/L (ref 3.5–5.2)
Sodium: 141 mmol/L (ref 134–144)
Total Protein: 7 g/dL (ref 6.0–8.5)
eGFR: 62 mL/min/1.73

## 2024-03-11 LAB — CBC WITH DIFFERENTIAL/PLATELET
Basophils Absolute: 0.1 x10E3/uL (ref 0.0–0.2)
Basos: 1 %
EOS (ABSOLUTE): 0.6 x10E3/uL — ABNORMAL HIGH (ref 0.0–0.4)
Eos: 4 %
Hematocrit: 40.9 % (ref 34.0–46.6)
Hemoglobin: 13.4 g/dL (ref 11.1–15.9)
Immature Grans (Abs): 0 x10E3/uL (ref 0.0–0.1)
Immature Granulocytes: 0 %
Lymphocytes Absolute: 3.3 x10E3/uL — ABNORMAL HIGH (ref 0.7–3.1)
Lymphs: 25 %
MCH: 30.8 pg (ref 26.6–33.0)
MCHC: 32.8 g/dL (ref 31.5–35.7)
MCV: 94 fL (ref 79–97)
Monocytes Absolute: 1 x10E3/uL — ABNORMAL HIGH (ref 0.1–0.9)
Monocytes: 7 %
Neutrophils Absolute: 8.5 x10E3/uL — ABNORMAL HIGH (ref 1.4–7.0)
Neutrophils: 63 %
Platelets: 342 x10E3/uL (ref 150–450)
RBC: 4.35 x10E6/uL (ref 3.77–5.28)
RDW: 12.6 % (ref 11.7–15.4)
WBC: 13.5 x10E3/uL — ABNORMAL HIGH (ref 3.4–10.8)

## 2024-03-11 LAB — GAMMA GT: GGT: 32 IU/L (ref 0–60)

## 2024-03-17 ENCOUNTER — Ambulatory Visit

## 2024-03-17 VITALS — BP 114/71 | HR 80 | Temp 97.7°F | Ht 67.0 in | Wt 255.1 lb

## 2024-03-17 DIAGNOSIS — I5020 Unspecified systolic (congestive) heart failure: Secondary | ICD-10-CM | POA: Diagnosis not present

## 2024-03-17 DIAGNOSIS — J432 Centrilobular emphysema: Secondary | ICD-10-CM | POA: Diagnosis not present

## 2024-03-17 DIAGNOSIS — F321 Major depressive disorder, single episode, moderate: Secondary | ICD-10-CM | POA: Insufficient documentation

## 2024-03-17 DIAGNOSIS — E782 Mixed hyperlipidemia: Secondary | ICD-10-CM | POA: Diagnosis not present

## 2024-03-17 DIAGNOSIS — Z6839 Body mass index (BMI) 39.0-39.9, adult: Secondary | ICD-10-CM | POA: Diagnosis not present

## 2024-03-17 DIAGNOSIS — Z23 Encounter for immunization: Secondary | ICD-10-CM

## 2024-03-17 DIAGNOSIS — I110 Hypertensive heart disease with heart failure: Secondary | ICD-10-CM

## 2024-03-17 DIAGNOSIS — I10 Essential (primary) hypertension: Secondary | ICD-10-CM

## 2024-03-17 DIAGNOSIS — H65111 Acute and subacute allergic otitis media (mucoid) (sanguinous) (serous), right ear: Secondary | ICD-10-CM | POA: Diagnosis not present

## 2024-03-17 MED ORDER — BUPROPION HCL ER (XL) 150 MG PO TB24: 150.0000 mg | ORAL_TABLET | Freq: Every day | ORAL | 2 refills | Status: AC

## 2024-03-17 NOTE — Patient Instructions (Signed)
 VISIT SUMMARY: Today, we addressed your ear discomfort, depressive symptoms, and other health concerns. We discussed treatment options and made adjustments to your medications to help manage your symptoms and improve your overall health.  YOUR PLAN: RIGHT EAR DISCOMFORT: You have fluid behind your eardrum causing a crackling noise and postnasal drainage. -Use Flonase  nasal spray daily. -Start taking Xyzal once daily for two weeks. -Consider using Mucinex  for congestion relief.  DEPRESSIVE SYMPTOMS AND FATIGUE: You are experiencing a lack of energy, interest, and motivation, likely due to family stress and bereavement. -We have prescribed bupropion, which you will start on December 23rd. -Monitor for any side effects and let us  know how you are feeling.  HYPERTENSION: Your blood pressure is well-controlled with your current medications.  HYPERLIPIDEMIA: Your cholesterol is being managed with Repatha  injections. -Continue with Repatha  injections.  OBESITY: You have lost some weight, which is good for your liver function and overall health.  CHRONIC OBSTRUCTIVE PULMONARY DISEASE: Your breathing has improved since you quit smoking, but you are not using your Breztri  inhaler regularly. -Please use your Breztri  inhaler regularly as prescribed.  GENERAL HEALTH MAINTENANCE: We discussed your vaccinations and screenings. -You received a flu shot today. -Plan to get pneumonia and RSV vaccines at the pharmacy. -Consider getting a COVID booster. -Defer your mammogram until your shoulder pain is controlled.  If you have any problems before your next visit feel free to message me via MyChart (minor issues or questions) or call the office, otherwise you may reach out to schedule an office visit.  Thank you! Saddie Sacks, PA-C

## 2024-03-17 NOTE — Assessment & Plan Note (Signed)
 Followed by [facility] pulmonology. Has successfully quit smoking. Breathing has improved. Continue Breztri .

## 2024-03-17 NOTE — Assessment & Plan Note (Signed)
 Patient working toward weight loss through diet changes as her orthopedic surgeon is requiring weight loss before doing surgery.

## 2024-03-17 NOTE — Assessment & Plan Note (Signed)
 Symptoms include lack of energy, interest, and motivation. Bupropion discussed as alternative to Cymbalta , may improve energy and aid weight loss. - Prescribed bupropion with first fill on December 23rd as patient reports she does not get paid until that week and cannot pay for the medicine until then and does not want pharmacy to fill it too early and then put back. - Monitor for side effects and efficacy.

## 2024-03-17 NOTE — Assessment & Plan Note (Signed)
 Last lipid panel: LDL 84, Trig 183, HDL 53. Continue atorvastatin  80 mg and Repatha  injections q2 weeks. Zetia  dc'd by cardiology after approval of Repatha . Continue follow up with cardiology. Will cont to monitor.  LDL goal: < 70 mg/dl TG <849 mg/dl

## 2024-03-17 NOTE — Assessment & Plan Note (Signed)
 Serous fluid behind eardrum causing crackling noise and postnasal drainage. No infection present. - Use Flonase  nasal spray daily. - Start Xyzal once daily for two weeks. - Consider Mucinex  for congestion relief.

## 2024-03-17 NOTE — Assessment & Plan Note (Signed)
 BP goal <130/80. Stable, at goal. Cont losartan  50 mg daily, metoprolol  succinate 100 mg daily, spironolactone  25 mg daily. Will cont to monitor.

## 2024-03-17 NOTE — Assessment & Plan Note (Signed)
-   Continue spironolactone  25 mg, losartan  50 mg, and metoprolol  XL 100 mg daily. Continue follow up with cardiology.

## 2024-03-17 NOTE — Progress Notes (Signed)
 Established Patient Office Visit  Subjective   Patient ID: Katrina [name], female    DOB: 1955-11-05  Age: 68 y.o. MRN: [id number]  Chief Complaint  Patient presents with   Medical Management of Chronic Issues    HPI  Discussed the use of AI scribe software for clinical note transcription with the patient, who gave verbal consent to proceed.  History of Present Illness   Katrina [name] is a 68 year old female who presents with ear discomfort and depressive symptoms.  Right ear discomfort - Crackling noise in right ear when turning head - Initial concern for foreign body, but only wax observed by daughter - No symptoms in left ear - Constant post-nasal drip - Persistent nasal congestion - Denies hearing loss or ear pain or dizziness   Depressive symptoms and fatigue - Significant decrease in interest and motivation for previously enjoyed activities (reading, computer games) - Fatigue and reluctance to get out of bed - Attribution of symptoms to family stress and bereavement (loss of husband) - Not currently on SSRI or SNRI therapy. She was previously on Cymbalta  but dc'd due to concerns for weight gain.   Liver function abnormalities - History of elevated liver function tests - Improvement in liver function with weight loss and reduced alcohol  intake - Recent weight loss of two pounds - Alcohol  consumption reduced to approximately four beers in past two to three weeks  Respiratory status and smoking cessation - History of smoking cessation - Improved breathing since quitting smoking - Nonadherence to Breztri  inhaler due to improved symptoms - Multiple unused inhalers at home  Hyperlipidemia and medication management - Uses Repatha  injections and atorvastatin  for cholesterol management -Cardiologist dc'd Zetia  after Repatha  injections were approved        ROS Per HPI.    Objective:     BP 114/71   Pulse 80   Temp 97.7 F (36.5 C) (Oral)   Ht 5' 7  (1.702 m)   Wt 255 lb 1.9 oz (115.7 kg)   SpO2 96%   BMI 39.96 kg/m    Physical Exam Constitutional:      General: She is not in acute distress.    Appearance: Normal appearance.  HENT:     Right Ear: Hearing normal. A middle ear effusion is present.     Left Ear: Hearing and tympanic membrane normal.  Cardiovascular:     Rate and Rhythm: Normal rate and regular rhythm.     Heart sounds: Normal heart sounds. No murmur heard.    No friction rub. No gallop.  Pulmonary:     Effort: Pulmonary effort is normal. No respiratory distress.     Breath sounds: Normal breath sounds.  Musculoskeletal:        General: No swelling.  Skin:    General: Skin is warm and dry.  Neurological:     General: No focal deficit present.     Mental Status: She is alert.  Psychiatric:        Mood and Affect: Mood normal.        Behavior: Behavior normal.        Thought Content: Thought content normal.      No results found for any visits on 03/17/24.  Last CBC Lab Results  Component Value Date   WBC 13.5 (H) 03/10/2024   HGB 13.4 03/10/2024   HCT 40.9 03/10/2024   MCV 94 03/10/2024   MCH 30.8 03/10/2024   RDW 12.6 03/10/2024   PLT 342  03/10/2024   Last metabolic panel Lab Results  Component Value Date   GLUCOSE 105 (H) 03/10/2024   NA 141 03/10/2024   K 5.2 03/10/2024   CL 100 03/10/2024   CO2 20 03/10/2024   BUN 13 03/10/2024   CREATININE 0.99 03/10/2024   EGFR 62 03/10/2024   CALCIUM  10.1 03/10/2024   PHOS 3.2 06/01/2022   PROT 7.0 03/10/2024   ALBUMIN 4.2 03/10/2024   LABGLOB 2.8 03/10/2024   AGRATIO 1.6 07/30/2021   BILITOT 0.5 03/10/2024   ALKPHOS 131 03/10/2024   AST 18 03/10/2024   ALT 20 03/10/2024   ANIONGAP 7 06/03/2022   Last lipids Lab Results  Component Value Date   CHOL 168 11/17/2023   HDL 53 11/17/2023   LDLCALC 84 11/17/2023   TRIG 183 (H) 11/17/2023   CHOLHDL 3.2 11/17/2023   Last hemoglobin A1c Lab Results  Component Value Date   HGBA1C 5.8  (H) 11/17/2023   Last thyroid  functions Lab Results  Component Value Date   TSH 1.930 11/17/2023   FREET4 1.09 05/12/2023   Last vitamin D  Lab Results  Component Value Date   VD25OH 25.1 (L) 11/17/2023      The 10-year ASCVD risk score (Arnett DK, et al., 2019) is: 7.7%    Assessment & Plan:   Encounter for vaccination -     Flu vaccine HIGH DOSE PF(Fluzone Trivalent)  HFrEF (heart failure with reduced ejection fraction) (HCC) Assessment & Plan: - Continue spironolactone  25 mg, losartan  50 mg, and metoprolol  XL 100 mg daily. Continue follow up with cardiology.    Centrilobular emphysema (HCC) Assessment & Plan: Followed by [facility] pulmonology. Has successfully quit smoking. Breathing has improved. Continue Breztri .   Mixed hyperlipidemia Assessment & Plan: Last lipid panel: LDL 84, Trig 183, HDL 53. Continue atorvastatin  80 mg and Repatha  injections q2 weeks. Zetia  dc'd by cardiology after approval of Repatha . Continue follow up with cardiology. Will cont to monitor.  LDL goal: < 70 mg/dl TG <849 mg/dl    Essential hypertension Assessment & Plan: BP goal <130/80. Stable, at goal. Cont losartan  50 mg daily, metoprolol  succinate 100 mg daily, spironolactone  25 mg daily. Will cont to monitor.    Class 2 severe obesity due to excess calories with serious comorbidity and body mass index (BMI) of 39.0 to 39.9 in adult Assessment & Plan: Patient working toward weight loss through diet changes as her orthopedic surgeon is requiring weight loss before doing surgery.    Depression, major, single episode, moderate (HCC) Assessment & Plan: Symptoms include lack of energy, interest, and motivation. Bupropion discussed as alternative to Cymbalta , may improve energy and aid weight loss. - Prescribed bupropion with first fill on December 23rd as patient reports she does not get paid until that week and cannot pay for the medicine until then and does not want pharmacy to fill it  too early and then put back. - Monitor for side effects and efficacy.   Acute allergic serous otitis media of right ear Assessment & Plan: Serous fluid behind eardrum causing crackling noise and postnasal drainage. No infection present. - Use Flonase  nasal spray daily. - Start Xyzal once daily for two weeks. - Consider Mucinex  for congestion relief.   Other orders -     buPROPion HCl ER (XL); Take 1 tablet (150 mg total) by mouth daily.  Dispense: 30 tablet; Refill: 2 -     Levocetirizine Dihydrochloride; Take 1 tablet (5 mg total) by mouth every evening.  Dispense: 30 tablet;  Refill: 0    Return in about 3 months (around 06/15/2024) for Mood, HTN, pre-DM.    Saddie JULIANNA Sacks, PA-C

## 2024-04-28 ENCOUNTER — Other Ambulatory Visit: Payer: Self-pay | Admitting: Internal Medicine

## 2024-04-28 DIAGNOSIS — E7849 Other hyperlipidemia: Secondary | ICD-10-CM

## 2024-08-18 ENCOUNTER — Ambulatory Visit
